# Patient Record
Sex: Male | Born: 1960 | Race: White | Hispanic: No | Marital: Married | Smoking: Never smoker
Health system: Southern US, Community
[De-identification: ages and names within clinical notes are randomized; demographics above are authoritative.]

## PROBLEM LIST (undated history)

## (undated) DIAGNOSIS — G709 Myoneural disorder, unspecified: Secondary | ICD-10-CM

## (undated) DIAGNOSIS — Z87442 Personal history of urinary calculi: Secondary | ICD-10-CM

## (undated) DIAGNOSIS — G4730 Sleep apnea, unspecified: Secondary | ICD-10-CM

## (undated) DIAGNOSIS — J45909 Unspecified asthma, uncomplicated: Secondary | ICD-10-CM

## (undated) DIAGNOSIS — R011 Cardiac murmur, unspecified: Secondary | ICD-10-CM

## (undated) DIAGNOSIS — I499 Cardiac arrhythmia, unspecified: Secondary | ICD-10-CM

## (undated) DIAGNOSIS — D689 Coagulation defect, unspecified: Secondary | ICD-10-CM

## (undated) DIAGNOSIS — I10 Essential (primary) hypertension: Secondary | ICD-10-CM

## (undated) DIAGNOSIS — Z9889 Other specified postprocedural states: Secondary | ICD-10-CM

## (undated) DIAGNOSIS — C4490 Unspecified malignant neoplasm of skin, unspecified: Secondary | ICD-10-CM

## (undated) DIAGNOSIS — J189 Pneumonia, unspecified organism: Secondary | ICD-10-CM

## (undated) DIAGNOSIS — R569 Unspecified convulsions: Secondary | ICD-10-CM

## (undated) DIAGNOSIS — M1990 Unspecified osteoarthritis, unspecified site: Secondary | ICD-10-CM

## (undated) DIAGNOSIS — Q2112 Patent foramen ovale: Secondary | ICD-10-CM

## (undated) DIAGNOSIS — D649 Anemia, unspecified: Secondary | ICD-10-CM

## (undated) DIAGNOSIS — I480 Paroxysmal atrial fibrillation: Secondary | ICD-10-CM

## (undated) DIAGNOSIS — S83206A Unspecified tear of unspecified meniscus, current injury, right knee, initial encounter: Secondary | ICD-10-CM

## (undated) DIAGNOSIS — Z9289 Personal history of other medical treatment: Secondary | ICD-10-CM

## (undated) DIAGNOSIS — B20 Human immunodeficiency virus [HIV] disease: Secondary | ICD-10-CM

## (undated) DIAGNOSIS — K219 Gastro-esophageal reflux disease without esophagitis: Secondary | ICD-10-CM

## (undated) DIAGNOSIS — I639 Cerebral infarction, unspecified: Secondary | ICD-10-CM

## (undated) DIAGNOSIS — Z800 Family history of malignant neoplasm of digestive organs: Secondary | ICD-10-CM

## (undated) DIAGNOSIS — R7303 Prediabetes: Secondary | ICD-10-CM

## (undated) HISTORY — DX: Human immunodeficiency virus (HIV) disease: B20

## (undated) HISTORY — DX: Paroxysmal atrial fibrillation: I48.0

## (undated) HISTORY — DX: Essential (primary) hypertension: I10

## (undated) HISTORY — PX: INGUINAL HERNIA REPAIR: SUR1180

## (undated) HISTORY — DX: Family history of malignant neoplasm of digestive organs: Z80.0

## (undated) HISTORY — DX: Unspecified malignant neoplasm of skin, unspecified: C44.90

## (undated) HISTORY — PX: BACK SURGERY: SHX140

## (undated) HISTORY — DX: Coagulation defect, unspecified: D68.9

## (undated) HISTORY — PX: SKIN CANCER EXCISION: SHX779

## (undated) HISTORY — DX: Anemia, unspecified: D64.9

## (undated) HISTORY — DX: Unspecified convulsions: R56.9

---

## 1997-06-24 DIAGNOSIS — Z9289 Personal history of other medical treatment: Secondary | ICD-10-CM

## 1997-06-24 HISTORY — DX: Personal history of other medical treatment: Z92.89

## 1997-10-15 ENCOUNTER — Inpatient Hospital Stay (HOSPITAL_COMMUNITY): Admission: AD | Admit: 1997-10-15 | Discharge: 1997-11-01 | Payer: Self-pay | Admitting: Oncology

## 1997-11-18 ENCOUNTER — Encounter: Admission: RE | Admit: 1997-11-18 | Discharge: 1997-11-18 | Payer: Self-pay | Admitting: Infectious Diseases

## 1997-11-18 ENCOUNTER — Encounter (INDEPENDENT_AMBULATORY_CARE_PROVIDER_SITE_OTHER): Payer: Self-pay | Admitting: *Deleted

## 1997-11-18 LAB — CONVERTED CEMR LAB
CD4 Count: 40 microliters
CD4 T Cell Abs: 40

## 1997-12-15 ENCOUNTER — Encounter: Admission: RE | Admit: 1997-12-15 | Discharge: 1997-12-15 | Payer: Self-pay | Admitting: Infectious Diseases

## 1998-01-09 ENCOUNTER — Encounter: Admission: RE | Admit: 1998-01-09 | Discharge: 1998-01-09 | Payer: Self-pay | Admitting: Infectious Diseases

## 1998-01-26 ENCOUNTER — Encounter: Admission: RE | Admit: 1998-01-26 | Discharge: 1998-01-26 | Payer: Self-pay | Admitting: Infectious Diseases

## 1998-02-20 ENCOUNTER — Encounter: Admission: RE | Admit: 1998-02-20 | Discharge: 1998-02-20 | Payer: Self-pay | Admitting: Infectious Diseases

## 1998-03-03 ENCOUNTER — Encounter: Admission: RE | Admit: 1998-03-03 | Discharge: 1998-03-03 | Payer: Self-pay | Admitting: Obstetrics and Gynecology

## 1998-06-01 ENCOUNTER — Encounter: Admission: RE | Admit: 1998-06-01 | Discharge: 1998-06-01 | Payer: Self-pay | Admitting: Infectious Diseases

## 1998-06-15 ENCOUNTER — Encounter: Admission: RE | Admit: 1998-06-15 | Discharge: 1998-06-15 | Payer: Self-pay | Admitting: Infectious Diseases

## 1998-07-11 ENCOUNTER — Emergency Department (HOSPITAL_COMMUNITY): Admission: EM | Admit: 1998-07-11 | Discharge: 1998-07-11 | Payer: Self-pay | Admitting: Emergency Medicine

## 1998-07-12 ENCOUNTER — Encounter: Payer: Self-pay | Admitting: *Deleted

## 1998-07-13 ENCOUNTER — Encounter: Admission: RE | Admit: 1998-07-13 | Discharge: 1998-07-13 | Payer: Self-pay | Admitting: Infectious Diseases

## 1998-07-13 ENCOUNTER — Ambulatory Visit (HOSPITAL_COMMUNITY): Admission: RE | Admit: 1998-07-13 | Discharge: 1998-07-13 | Payer: Self-pay | Admitting: Infectious Diseases

## 1998-07-27 ENCOUNTER — Encounter: Admission: RE | Admit: 1998-07-27 | Discharge: 1998-07-27 | Payer: Self-pay | Admitting: Infectious Diseases

## 1998-11-09 ENCOUNTER — Ambulatory Visit (HOSPITAL_COMMUNITY): Admission: RE | Admit: 1998-11-09 | Discharge: 1998-11-09 | Payer: Self-pay | Admitting: Infectious Diseases

## 1998-11-23 ENCOUNTER — Encounter: Admission: RE | Admit: 1998-11-23 | Discharge: 1998-11-23 | Payer: Self-pay | Admitting: Infectious Diseases

## 1999-02-14 ENCOUNTER — Ambulatory Visit (HOSPITAL_COMMUNITY): Admission: RE | Admit: 1999-02-14 | Discharge: 1999-02-14 | Payer: Self-pay | Admitting: Infectious Diseases

## 1999-02-14 ENCOUNTER — Encounter: Admission: RE | Admit: 1999-02-14 | Discharge: 1999-02-14 | Payer: Self-pay | Admitting: Infectious Diseases

## 1999-03-08 ENCOUNTER — Encounter: Admission: RE | Admit: 1999-03-08 | Discharge: 1999-03-08 | Payer: Self-pay | Admitting: Infectious Diseases

## 1999-05-24 ENCOUNTER — Ambulatory Visit (HOSPITAL_COMMUNITY): Admission: RE | Admit: 1999-05-24 | Discharge: 1999-05-24 | Payer: Self-pay | Admitting: Infectious Diseases

## 1999-05-24 ENCOUNTER — Encounter: Admission: RE | Admit: 1999-05-24 | Discharge: 1999-05-24 | Payer: Self-pay | Admitting: Infectious Diseases

## 1999-06-14 ENCOUNTER — Encounter: Admission: RE | Admit: 1999-06-14 | Discharge: 1999-06-14 | Payer: Self-pay | Admitting: Infectious Diseases

## 1999-09-12 ENCOUNTER — Ambulatory Visit (HOSPITAL_COMMUNITY): Admission: RE | Admit: 1999-09-12 | Discharge: 1999-09-12 | Payer: Self-pay | Admitting: Infectious Diseases

## 1999-09-12 ENCOUNTER — Encounter: Admission: RE | Admit: 1999-09-12 | Discharge: 1999-09-12 | Payer: Self-pay | Admitting: Infectious Diseases

## 1999-09-27 ENCOUNTER — Encounter: Admission: RE | Admit: 1999-09-27 | Discharge: 1999-09-27 | Payer: Self-pay | Admitting: Infectious Diseases

## 1999-12-28 ENCOUNTER — Encounter: Admission: RE | Admit: 1999-12-28 | Discharge: 1999-12-28 | Payer: Self-pay | Admitting: Infectious Diseases

## 1999-12-28 ENCOUNTER — Ambulatory Visit (HOSPITAL_COMMUNITY): Admission: RE | Admit: 1999-12-28 | Discharge: 1999-12-28 | Payer: Self-pay | Admitting: Infectious Diseases

## 2000-01-31 ENCOUNTER — Encounter: Admission: RE | Admit: 2000-01-31 | Discharge: 2000-01-31 | Payer: Self-pay | Admitting: Infectious Diseases

## 2000-06-23 ENCOUNTER — Ambulatory Visit (HOSPITAL_COMMUNITY): Admission: RE | Admit: 2000-06-23 | Discharge: 2000-06-23 | Payer: Self-pay | Admitting: Infectious Diseases

## 2000-06-23 ENCOUNTER — Encounter: Admission: RE | Admit: 2000-06-23 | Discharge: 2000-06-23 | Payer: Self-pay | Admitting: Infectious Diseases

## 2000-07-04 ENCOUNTER — Encounter: Admission: RE | Admit: 2000-07-04 | Discharge: 2000-07-04 | Payer: Self-pay | Admitting: Infectious Diseases

## 2001-02-24 ENCOUNTER — Encounter: Admission: RE | Admit: 2001-02-24 | Discharge: 2001-02-24 | Payer: Self-pay | Admitting: Infectious Diseases

## 2001-02-24 ENCOUNTER — Ambulatory Visit (HOSPITAL_COMMUNITY): Admission: RE | Admit: 2001-02-24 | Discharge: 2001-02-24 | Payer: Self-pay | Admitting: Infectious Diseases

## 2001-03-26 ENCOUNTER — Encounter: Admission: RE | Admit: 2001-03-26 | Discharge: 2001-03-26 | Payer: Self-pay | Admitting: Infectious Diseases

## 2001-05-18 ENCOUNTER — Ambulatory Visit (HOSPITAL_COMMUNITY): Admission: RE | Admit: 2001-05-18 | Discharge: 2001-05-18 | Payer: Self-pay | Admitting: General Surgery

## 2001-05-18 ENCOUNTER — Encounter: Payer: Self-pay | Admitting: General Surgery

## 2001-05-18 HISTORY — PX: OTHER SURGICAL HISTORY: SHX169

## 2001-07-01 ENCOUNTER — Ambulatory Visit (HOSPITAL_COMMUNITY): Admission: RE | Admit: 2001-07-01 | Discharge: 2001-07-01 | Payer: Self-pay | Admitting: Infectious Diseases

## 2001-07-01 ENCOUNTER — Encounter: Admission: RE | Admit: 2001-07-01 | Discharge: 2001-07-01 | Payer: Self-pay | Admitting: Infectious Diseases

## 2001-08-06 ENCOUNTER — Encounter: Admission: RE | Admit: 2001-08-06 | Discharge: 2001-08-06 | Payer: Self-pay | Admitting: Infectious Diseases

## 2002-01-20 ENCOUNTER — Encounter: Admission: RE | Admit: 2002-01-20 | Discharge: 2002-01-20 | Payer: Self-pay | Admitting: Psychiatry

## 2002-01-20 ENCOUNTER — Ambulatory Visit (HOSPITAL_COMMUNITY): Admission: RE | Admit: 2002-01-20 | Discharge: 2002-01-20 | Payer: Self-pay | Admitting: Infectious Diseases

## 2002-03-25 ENCOUNTER — Encounter: Admission: RE | Admit: 2002-03-25 | Discharge: 2002-03-25 | Payer: Self-pay | Admitting: Infectious Diseases

## 2002-05-24 ENCOUNTER — Ambulatory Visit (HOSPITAL_COMMUNITY): Admission: RE | Admit: 2002-05-24 | Discharge: 2002-05-24 | Payer: Self-pay | Admitting: Infectious Diseases

## 2002-05-24 ENCOUNTER — Encounter: Admission: RE | Admit: 2002-05-24 | Discharge: 2002-05-24 | Payer: Self-pay | Admitting: Infectious Diseases

## 2002-06-03 ENCOUNTER — Encounter: Admission: RE | Admit: 2002-06-03 | Discharge: 2002-06-03 | Payer: Self-pay | Admitting: Infectious Diseases

## 2002-09-15 ENCOUNTER — Encounter: Admission: RE | Admit: 2002-09-15 | Discharge: 2002-09-15 | Payer: Self-pay | Admitting: Infectious Diseases

## 2002-09-30 ENCOUNTER — Encounter: Admission: RE | Admit: 2002-09-30 | Discharge: 2002-09-30 | Payer: Self-pay | Admitting: Infectious Diseases

## 2002-12-16 ENCOUNTER — Encounter: Admission: RE | Admit: 2002-12-16 | Discharge: 2002-12-16 | Payer: Self-pay | Admitting: Infectious Diseases

## 2002-12-16 ENCOUNTER — Ambulatory Visit (HOSPITAL_COMMUNITY): Admission: RE | Admit: 2002-12-16 | Discharge: 2002-12-16 | Payer: Self-pay | Admitting: Infectious Diseases

## 2002-12-30 ENCOUNTER — Encounter: Admission: RE | Admit: 2002-12-30 | Discharge: 2002-12-30 | Payer: Self-pay | Admitting: Infectious Diseases

## 2003-03-31 ENCOUNTER — Encounter: Admission: RE | Admit: 2003-03-31 | Discharge: 2003-03-31 | Payer: Self-pay | Admitting: Infectious Diseases

## 2003-03-31 ENCOUNTER — Ambulatory Visit (HOSPITAL_COMMUNITY): Admission: RE | Admit: 2003-03-31 | Discharge: 2003-03-31 | Payer: Self-pay | Admitting: Infectious Diseases

## 2003-03-31 ENCOUNTER — Encounter: Payer: Self-pay | Admitting: Infectious Diseases

## 2003-04-14 ENCOUNTER — Encounter: Admission: RE | Admit: 2003-04-14 | Discharge: 2003-04-14 | Payer: Self-pay | Admitting: Infectious Diseases

## 2003-06-09 ENCOUNTER — Encounter (INDEPENDENT_AMBULATORY_CARE_PROVIDER_SITE_OTHER): Payer: Self-pay | Admitting: Specialist

## 2003-06-09 ENCOUNTER — Observation Stay (HOSPITAL_COMMUNITY): Admission: EM | Admit: 2003-06-09 | Discharge: 2003-06-10 | Payer: Self-pay | Admitting: *Deleted

## 2003-06-09 HISTORY — PX: APPENDECTOMY: SHX54

## 2003-06-28 ENCOUNTER — Encounter: Admission: RE | Admit: 2003-06-28 | Discharge: 2003-06-28 | Payer: Self-pay | Admitting: General Surgery

## 2003-06-30 ENCOUNTER — Emergency Department (HOSPITAL_COMMUNITY): Admission: EM | Admit: 2003-06-30 | Discharge: 2003-06-30 | Payer: Self-pay

## 2003-07-08 ENCOUNTER — Ambulatory Visit (HOSPITAL_COMMUNITY): Admission: RE | Admit: 2003-07-08 | Discharge: 2003-07-09 | Payer: Self-pay | Admitting: General Surgery

## 2003-07-08 ENCOUNTER — Encounter (INDEPENDENT_AMBULATORY_CARE_PROVIDER_SITE_OTHER): Payer: Self-pay | Admitting: *Deleted

## 2003-07-08 HISTORY — PX: LAPAROSCOPIC CHOLECYSTECTOMY: SUR755

## 2003-09-02 ENCOUNTER — Emergency Department (HOSPITAL_COMMUNITY): Admission: AD | Admit: 2003-09-02 | Discharge: 2003-09-02 | Payer: Self-pay | Admitting: Family Medicine

## 2003-09-13 ENCOUNTER — Ambulatory Visit (HOSPITAL_COMMUNITY): Admission: RE | Admit: 2003-09-13 | Discharge: 2003-09-13 | Payer: Self-pay | Admitting: Infectious Diseases

## 2003-09-13 ENCOUNTER — Encounter: Admission: RE | Admit: 2003-09-13 | Discharge: 2003-09-13 | Payer: Self-pay | Admitting: Infectious Diseases

## 2003-09-28 ENCOUNTER — Encounter: Admission: RE | Admit: 2003-09-28 | Discharge: 2003-09-28 | Payer: Self-pay | Admitting: Infectious Diseases

## 2004-05-28 ENCOUNTER — Ambulatory Visit: Payer: Self-pay | Admitting: Infectious Diseases

## 2004-05-28 ENCOUNTER — Ambulatory Visit (HOSPITAL_COMMUNITY): Admission: RE | Admit: 2004-05-28 | Discharge: 2004-05-28 | Payer: Self-pay | Admitting: Infectious Diseases

## 2004-06-14 ENCOUNTER — Ambulatory Visit: Payer: Self-pay | Admitting: Infectious Diseases

## 2004-06-14 ENCOUNTER — Ambulatory Visit (HOSPITAL_COMMUNITY): Admission: RE | Admit: 2004-06-14 | Discharge: 2004-06-14 | Payer: Self-pay | Admitting: Infectious Diseases

## 2004-09-20 ENCOUNTER — Ambulatory Visit: Payer: Self-pay | Admitting: Infectious Diseases

## 2004-09-20 ENCOUNTER — Encounter (INDEPENDENT_AMBULATORY_CARE_PROVIDER_SITE_OTHER): Payer: Self-pay | Admitting: *Deleted

## 2004-09-20 ENCOUNTER — Ambulatory Visit (HOSPITAL_COMMUNITY): Admission: RE | Admit: 2004-09-20 | Discharge: 2004-09-20 | Payer: Self-pay | Admitting: Infectious Diseases

## 2004-09-20 LAB — CONVERTED CEMR LAB
CD4 Count: 430 microliters
HIV 1 RNA Quant: 399 copies/mL

## 2004-10-09 ENCOUNTER — Ambulatory Visit: Payer: Self-pay | Admitting: Infectious Diseases

## 2005-06-29 ENCOUNTER — Emergency Department (HOSPITAL_COMMUNITY): Admission: EM | Admit: 2005-06-29 | Discharge: 2005-06-29 | Payer: Self-pay | Admitting: Emergency Medicine

## 2005-07-01 ENCOUNTER — Ambulatory Visit (HOSPITAL_COMMUNITY): Admission: RE | Admit: 2005-07-01 | Discharge: 2005-07-01 | Payer: Self-pay | Admitting: Infectious Diseases

## 2005-07-01 ENCOUNTER — Ambulatory Visit: Payer: Self-pay | Admitting: Infectious Diseases

## 2005-07-01 ENCOUNTER — Encounter (INDEPENDENT_AMBULATORY_CARE_PROVIDER_SITE_OTHER): Payer: Self-pay | Admitting: *Deleted

## 2005-07-01 LAB — CONVERTED CEMR LAB
CD4 Count: 490 microliters
HIV 1 RNA Quant: 399 copies/mL

## 2005-08-02 ENCOUNTER — Ambulatory Visit: Payer: Self-pay | Admitting: Infectious Diseases

## 2006-03-13 ENCOUNTER — Encounter: Admission: RE | Admit: 2006-03-13 | Discharge: 2006-03-13 | Payer: Self-pay | Admitting: Infectious Diseases

## 2006-03-13 ENCOUNTER — Encounter (INDEPENDENT_AMBULATORY_CARE_PROVIDER_SITE_OTHER): Payer: Self-pay | Admitting: *Deleted

## 2006-03-13 ENCOUNTER — Ambulatory Visit: Payer: Self-pay | Admitting: Infectious Diseases

## 2006-03-13 LAB — CONVERTED CEMR LAB
CD4 Count: 370 microliters
HIV 1 RNA Quant: 49 copies/mL

## 2006-03-27 ENCOUNTER — Ambulatory Visit: Payer: Self-pay | Admitting: Infectious Diseases

## 2006-08-18 ENCOUNTER — Encounter (INDEPENDENT_AMBULATORY_CARE_PROVIDER_SITE_OTHER): Payer: Self-pay | Admitting: *Deleted

## 2006-08-18 LAB — CONVERTED CEMR LAB

## 2006-08-31 ENCOUNTER — Encounter (INDEPENDENT_AMBULATORY_CARE_PROVIDER_SITE_OTHER): Payer: Self-pay | Admitting: *Deleted

## 2006-11-18 ENCOUNTER — Telehealth: Payer: Self-pay | Admitting: Infectious Diseases

## 2006-11-18 ENCOUNTER — Encounter: Payer: Self-pay | Admitting: Infectious Diseases

## 2006-11-18 DIAGNOSIS — M6580 Other synovitis and tenosynovitis, unspecified site: Secondary | ICD-10-CM | POA: Insufficient documentation

## 2006-11-18 DIAGNOSIS — B20 Human immunodeficiency virus [HIV] disease: Secondary | ICD-10-CM | POA: Insufficient documentation

## 2006-11-27 ENCOUNTER — Ambulatory Visit: Payer: Self-pay | Admitting: Infectious Diseases

## 2006-11-27 ENCOUNTER — Encounter: Admission: RE | Admit: 2006-11-27 | Discharge: 2006-11-27 | Payer: Self-pay | Admitting: Infectious Diseases

## 2006-12-10 ENCOUNTER — Encounter: Payer: Self-pay | Admitting: Internal Medicine

## 2006-12-11 ENCOUNTER — Ambulatory Visit: Payer: Self-pay | Admitting: Infectious Diseases

## 2007-05-28 ENCOUNTER — Emergency Department (HOSPITAL_COMMUNITY): Admission: EM | Admit: 2007-05-28 | Discharge: 2007-05-28 | Payer: Self-pay | Admitting: Emergency Medicine

## 2007-07-21 ENCOUNTER — Telehealth: Payer: Self-pay | Admitting: Infectious Diseases

## 2007-11-26 ENCOUNTER — Encounter: Admission: RE | Admit: 2007-11-26 | Discharge: 2007-11-26 | Payer: Self-pay | Admitting: Infectious Diseases

## 2007-11-26 ENCOUNTER — Ambulatory Visit: Payer: Self-pay | Admitting: Infectious Diseases

## 2007-11-26 LAB — CONVERTED CEMR LAB
ALT: 22 units/L (ref 0–53)
AST: 25 units/L (ref 0–37)
Albumin: 4.4 g/dL (ref 3.5–5.2)
Alkaline Phosphatase: 79 units/L (ref 39–117)
BUN: 17 mg/dL (ref 6–23)
Basophils Absolute: 0.1 10*3/uL (ref 0.0–0.1)
Basophils Relative: 1 % (ref 0–1)
CO2: 23 meq/L (ref 19–32)
Calcium: 9.6 mg/dL (ref 8.4–10.5)
Chloride: 108 meq/L (ref 96–112)
Cholesterol: 204 mg/dL — ABNORMAL HIGH (ref 0–200)
Creatinine, Ser: 0.88 mg/dL (ref 0.40–1.50)
Eosinophils Absolute: 0.1 10*3/uL (ref 0.0–0.7)
Eosinophils Relative: 3 % (ref 0–5)
Glucose, Bld: 91 mg/dL (ref 70–99)
HCT: 45.2 % (ref 39.0–52.0)
HDL: 30 mg/dL — ABNORMAL LOW (ref >39)
HIV 1 RNA Quant: 509 copies/mL — ABNORMAL HIGH (ref <50)
HIV-1 RNA Quant, Log: 2.71 — ABNORMAL HIGH (ref <1.70)
Hemoglobin: 15.6 g/dL (ref 13.0–17.0)
LDL Cholesterol: 135 mg/dL — ABNORMAL HIGH (ref 0–99)
Lymphocytes Relative: 39 % (ref 12–46)
Lymphs Abs: 2.1 10*3/uL (ref 0.7–4.0)
MCHC: 34.5 g/dL (ref 30.0–36.0)
MCV: 89.2 fL (ref 78.0–100.0)
Monocytes Absolute: 0.5 10*3/uL (ref 0.1–1.0)
Monocytes Relative: 9 % (ref 3–12)
Neutro Abs: 2.6 10*3/uL (ref 1.7–7.7)
Neutrophils Relative %: 48 % (ref 43–77)
Platelets: 224 10*3/uL (ref 150–400)
Potassium: 4 meq/L (ref 3.5–5.3)
RBC: 5.07 M/uL (ref 4.22–5.81)
RDW: 13.6 % (ref 11.5–15.5)
Sodium: 140 meq/L (ref 135–145)
Total Bilirubin: 0.6 mg/dL (ref 0.3–1.2)
Total CHOL/HDL Ratio: 6.8
Total Protein: 7.8 g/dL (ref 6.0–8.3)
Triglycerides: 197 mg/dL — ABNORMAL HIGH (ref <150)
VLDL: 39 mg/dL (ref 0–40)
WBC: 5.3 10*3/uL (ref 4.0–10.5)

## 2007-12-01 ENCOUNTER — Telehealth: Payer: Self-pay | Admitting: Internal Medicine

## 2007-12-10 ENCOUNTER — Encounter (INDEPENDENT_AMBULATORY_CARE_PROVIDER_SITE_OTHER): Payer: Self-pay | Admitting: *Deleted

## 2007-12-10 ENCOUNTER — Ambulatory Visit: Payer: Self-pay | Admitting: Infectious Diseases

## 2008-04-06 ENCOUNTER — Encounter: Payer: Self-pay | Admitting: Infectious Diseases

## 2008-04-06 ENCOUNTER — Ambulatory Visit: Payer: Self-pay | Admitting: Infectious Disease

## 2008-04-06 LAB — CONVERTED CEMR LAB
ALT: 30 units/L (ref 0–53)
AST: 32 units/L (ref 0–37)
Albumin: 4.6 g/dL (ref 3.5–5.2)
Alkaline Phosphatase: 85 units/L (ref 39–117)
BUN: 15 mg/dL (ref 6–23)
Basophils Absolute: 0.1 10*3/uL (ref 0.0–0.1)
Basophils Relative: 1 % (ref 0–1)
CO2: 22 meq/L (ref 19–32)
Calcium: 9.5 mg/dL (ref 8.4–10.5)
Chloride: 104 meq/L (ref 96–112)
Creatinine, Ser: 0.93 mg/dL (ref 0.40–1.50)
Eosinophils Absolute: 0.2 10*3/uL (ref 0.0–0.7)
Eosinophils Relative: 3 % (ref 0–5)
Glucose, Bld: 92 mg/dL (ref 70–99)
HCT: 45.2 % (ref 39.0–52.0)
HIV 1 RNA Quant: 93 copies/mL — ABNORMAL HIGH (ref <50)
HIV-1 RNA Quant, Log: 1.97 — ABNORMAL HIGH (ref <1.70)
Hemoglobin: 15.6 g/dL (ref 13.0–17.0)
Lymphocytes Relative: 36 % (ref 12–46)
Lymphs Abs: 2.1 10*3/uL (ref 0.7–4.0)
MCHC: 34.5 g/dL (ref 30.0–36.0)
MCV: 88.1 fL (ref 78.0–100.0)
Monocytes Absolute: 0.6 10*3/uL (ref 0.1–1.0)
Monocytes Relative: 10 % (ref 3–12)
Neutro Abs: 3 10*3/uL (ref 1.7–7.7)
Neutrophils Relative %: 51 % (ref 43–77)
Platelets: 228 10*3/uL (ref 150–400)
Potassium: 4.3 meq/L (ref 3.5–5.3)
RBC: 5.13 M/uL (ref 4.22–5.81)
RDW: 13.5 % (ref 11.5–15.5)
Sodium: 140 meq/L (ref 135–145)
Total Bilirubin: 0.5 mg/dL (ref 0.3–1.2)
Total Protein: 7.9 g/dL (ref 6.0–8.3)
WBC: 5.8 10*3/uL (ref 4.0–10.5)

## 2008-04-14 ENCOUNTER — Ambulatory Visit: Payer: Self-pay | Admitting: Infectious Diseases

## 2008-07-28 ENCOUNTER — Telehealth (INDEPENDENT_AMBULATORY_CARE_PROVIDER_SITE_OTHER): Payer: Self-pay | Admitting: *Deleted

## 2008-08-31 ENCOUNTER — Ambulatory Visit: Payer: Self-pay | Admitting: Infectious Diseases

## 2008-08-31 LAB — CONVERTED CEMR LAB
ALT: 30 units/L (ref 0–53)
AST: 33 units/L (ref 0–37)
Albumin: 4.7 g/dL (ref 3.5–5.2)
Alkaline Phosphatase: 86 units/L (ref 39–117)
BUN: 15 mg/dL (ref 6–23)
Band Neutrophils: 0 % (ref 0–10)
Basophils Absolute: 0.1 10*3/uL (ref 0.0–0.1)
Basophils Relative: 1 % (ref 0–1)
CO2: 24 meq/L (ref 19–32)
Calcium: 9.4 mg/dL (ref 8.4–10.5)
Chloride: 106 meq/L (ref 96–112)
Creatinine, Ser: 1.01 mg/dL (ref 0.40–1.50)
Eosinophils Absolute: 0.2 10*3/uL (ref 0.0–0.7)
Eosinophils Relative: 3 % (ref 0–5)
Glucose, Bld: 91 mg/dL (ref 70–99)
HCT: 46.2 % (ref 39.0–52.0)
HIV 1 RNA Quant: 430 copies/mL — ABNORMAL HIGH (ref <48)
HIV-1 RNA Quant, Log: 2.63 — ABNORMAL HIGH (ref <1.68)
Hemoglobin: 15.5 g/dL (ref 13.0–17.0)
Lymphocytes Relative: 41 % (ref 12–46)
Lymphs Abs: 2.4 10*3/uL (ref 0.7–4.0)
MCHC: 33.5 g/dL (ref 30.0–36.0)
MCV: 90.2 fL (ref 78.0–100.0)
Monocytes Absolute: 0.6 10*3/uL (ref 0.1–1.0)
Monocytes Relative: 10 % (ref 3–12)
Neutro Abs: 2.5 10*3/uL (ref 1.7–7.7)
Neutrophils Relative %: 45 % (ref 43–77)
Platelets: 212 10*3/uL (ref 150–400)
Potassium: 4.6 meq/L (ref 3.5–5.3)
RBC: 5.12 M/uL (ref 4.22–5.81)
RDW: 14 % (ref 11.5–15.5)
Sodium: 142 meq/L (ref 135–145)
Total Bilirubin: 0.5 mg/dL (ref 0.3–1.2)
Total Protein: 8.2 g/dL (ref 6.0–8.3)
WBC: 5.7 10*3/uL (ref 4.0–10.5)

## 2008-09-15 ENCOUNTER — Ambulatory Visit: Payer: Self-pay | Admitting: Infectious Diseases

## 2008-12-01 ENCOUNTER — Ambulatory Visit: Payer: Self-pay | Admitting: Cardiovascular Disease

## 2008-12-01 ENCOUNTER — Encounter (INDEPENDENT_AMBULATORY_CARE_PROVIDER_SITE_OTHER): Payer: Self-pay | Admitting: Cardiology

## 2008-12-01 ENCOUNTER — Inpatient Hospital Stay (HOSPITAL_COMMUNITY): Admission: EM | Admit: 2008-12-01 | Discharge: 2008-12-01 | Payer: Self-pay | Admitting: Emergency Medicine

## 2008-12-01 ENCOUNTER — Encounter: Payer: Self-pay | Admitting: Cardiology

## 2008-12-01 HISTORY — PX: TRANSTHORACIC ECHOCARDIOGRAM: SHX275

## 2008-12-05 ENCOUNTER — Ambulatory Visit: Payer: Self-pay | Admitting: Cardiology

## 2008-12-05 LAB — CONVERTED CEMR LAB
POC INR: 1.4
Protime: 14.4

## 2008-12-08 DIAGNOSIS — I6350 Cerebral infarction due to unspecified occlusion or stenosis of unspecified cerebral artery: Secondary | ICD-10-CM | POA: Insufficient documentation

## 2008-12-08 DIAGNOSIS — I4891 Unspecified atrial fibrillation: Secondary | ICD-10-CM | POA: Insufficient documentation

## 2008-12-08 DIAGNOSIS — I10 Essential (primary) hypertension: Secondary | ICD-10-CM | POA: Insufficient documentation

## 2008-12-09 ENCOUNTER — Encounter (INDEPENDENT_AMBULATORY_CARE_PROVIDER_SITE_OTHER): Payer: Self-pay | Admitting: Pharmacist

## 2008-12-09 ENCOUNTER — Ambulatory Visit: Payer: Self-pay | Admitting: Internal Medicine

## 2008-12-09 LAB — CONVERTED CEMR LAB
POC INR: 1.5
Protime: 14.8

## 2008-12-12 ENCOUNTER — Encounter: Payer: Self-pay | Admitting: Nurse Practitioner

## 2008-12-12 ENCOUNTER — Ambulatory Visit: Payer: Self-pay | Admitting: Internal Medicine

## 2008-12-19 ENCOUNTER — Encounter (INDEPENDENT_AMBULATORY_CARE_PROVIDER_SITE_OTHER): Payer: Self-pay | Admitting: *Deleted

## 2008-12-19 ENCOUNTER — Ambulatory Visit: Payer: Self-pay | Admitting: Cardiology

## 2008-12-19 LAB — CONVERTED CEMR LAB
POC INR: 1.6
Prothrombin Time: 15.6 s

## 2008-12-28 ENCOUNTER — Ambulatory Visit: Payer: Self-pay | Admitting: Internal Medicine

## 2008-12-28 LAB — CONVERTED CEMR LAB
POC INR: 1.8
Prothrombin Time: 16.5 s

## 2009-01-04 ENCOUNTER — Ambulatory Visit: Payer: Self-pay | Admitting: Internal Medicine

## 2009-01-04 LAB — CONVERTED CEMR LAB
POC INR: 2.1
Prothrombin Time: 17.9 s

## 2009-01-08 ENCOUNTER — Ambulatory Visit (HOSPITAL_BASED_OUTPATIENT_CLINIC_OR_DEPARTMENT_OTHER): Admission: RE | Admit: 2009-01-08 | Discharge: 2009-01-08 | Payer: Self-pay | Admitting: Internal Medicine

## 2009-01-08 ENCOUNTER — Encounter: Payer: Self-pay | Admitting: Internal Medicine

## 2009-01-15 ENCOUNTER — Ambulatory Visit: Payer: Self-pay | Admitting: Pulmonary Disease

## 2009-01-18 ENCOUNTER — Ambulatory Visit: Payer: Self-pay | Admitting: Cardiology

## 2009-01-18 LAB — CONVERTED CEMR LAB
POC INR: 2
Prothrombin Time: 17.3 s

## 2009-02-08 ENCOUNTER — Ambulatory Visit: Payer: Self-pay | Admitting: Cardiology

## 2009-02-08 LAB — CONVERTED CEMR LAB: POC INR: 2.4

## 2009-02-16 ENCOUNTER — Telehealth: Payer: Self-pay | Admitting: Internal Medicine

## 2009-02-22 ENCOUNTER — Ambulatory Visit: Payer: Self-pay | Admitting: Infectious Diseases

## 2009-02-22 LAB — CONVERTED CEMR LAB
ALT: 31 units/L (ref 0–53)
AST: 30 units/L (ref 0–37)
Albumin: 4.3 g/dL (ref 3.5–5.2)
Alkaline Phosphatase: 76 units/L (ref 39–117)
BUN: 15 mg/dL (ref 6–23)
Basophils Absolute: 0.1 10*3/uL (ref 0.0–0.1)
Basophils Relative: 1 % (ref 0–1)
CO2: 24 meq/L (ref 19–32)
Calcium: 8.9 mg/dL (ref 8.4–10.5)
Chloride: 107 meq/L (ref 96–112)
Cholesterol: 159 mg/dL (ref 0–200)
Creatinine, Ser: 0.94 mg/dL (ref 0.40–1.50)
Eosinophils Absolute: 0.1 10*3/uL (ref 0.0–0.7)
Eosinophils Relative: 2 % (ref 0–5)
Glucose, Bld: 94 mg/dL (ref 70–99)
HCT: 41.7 % (ref 39.0–52.0)
HDL: 27 mg/dL — ABNORMAL LOW (ref >39)
HIV 1 RNA Quant: 478 copies/mL — ABNORMAL HIGH (ref <48)
HIV-1 RNA Quant, Log: 2.68 — ABNORMAL HIGH (ref <1.68)
Hemoglobin: 14.2 g/dL (ref 13.0–17.0)
LDL Cholesterol: 83 mg/dL (ref 0–99)
Lymphocytes Relative: 35 % (ref 12–46)
Lymphs Abs: 2 10*3/uL (ref 0.7–4.0)
MCHC: 34.1 g/dL (ref 30.0–36.0)
MCV: 86 fL (ref >=78.0)
Monocytes Absolute: 0.6 10*3/uL (ref 0.1–1.0)
Monocytes Relative: 10 % (ref 3–12)
Neutro Abs: 2.9 10*3/uL (ref 1.7–7.7)
Neutrophils Relative %: 51 % (ref 43–77)
Platelets: 207 10*3/uL (ref 150–400)
Potassium: 4.3 meq/L (ref 3.5–5.3)
RBC: 4.85 M/uL (ref 4.22–5.81)
RDW: 14 % (ref 11.5–15.5)
Sodium: 143 meq/L (ref 135–145)
Total Bilirubin: 0.4 mg/dL (ref 0.3–1.2)
Total CHOL/HDL Ratio: 5.9
Total Protein: 7.4 g/dL (ref 6.0–8.3)
Triglycerides: 247 mg/dL — ABNORMAL HIGH (ref <150)
VLDL: 49 mg/dL — ABNORMAL HIGH (ref 0–40)
WBC: 5.8 10*3/uL (ref 4.0–10.5)

## 2009-03-08 ENCOUNTER — Ambulatory Visit: Payer: Self-pay | Admitting: Internal Medicine

## 2009-03-08 LAB — CONVERTED CEMR LAB: POC INR: 2.7

## 2009-03-23 ENCOUNTER — Ambulatory Visit: Payer: Self-pay | Admitting: Internal Medicine

## 2009-04-05 ENCOUNTER — Ambulatory Visit: Payer: Self-pay | Admitting: Internal Medicine

## 2009-04-05 LAB — CONVERTED CEMR LAB: POC INR: 2.4

## 2009-05-03 ENCOUNTER — Ambulatory Visit: Payer: Self-pay | Admitting: Internal Medicine

## 2009-05-03 LAB — CONVERTED CEMR LAB: POC INR: 2.1

## 2009-05-15 ENCOUNTER — Encounter: Payer: Self-pay | Admitting: Physician Assistant

## 2009-05-15 ENCOUNTER — Emergency Department (HOSPITAL_COMMUNITY): Admission: EM | Admit: 2009-05-15 | Discharge: 2009-05-15 | Payer: Self-pay | Admitting: Emergency Medicine

## 2009-05-15 ENCOUNTER — Ambulatory Visit: Payer: Self-pay | Admitting: Cardiology

## 2009-05-24 ENCOUNTER — Ambulatory Visit: Payer: Self-pay | Admitting: Cardiology

## 2009-05-24 ENCOUNTER — Encounter: Payer: Self-pay | Admitting: Physician Assistant

## 2009-05-25 ENCOUNTER — Encounter (INDEPENDENT_AMBULATORY_CARE_PROVIDER_SITE_OTHER): Payer: Self-pay | Admitting: *Deleted

## 2009-05-31 ENCOUNTER — Ambulatory Visit: Payer: Self-pay | Admitting: Cardiology

## 2009-05-31 LAB — CONVERTED CEMR LAB: POC INR: 1.9

## 2009-06-21 ENCOUNTER — Ambulatory Visit: Payer: Self-pay | Admitting: Cardiology

## 2009-06-21 LAB — CONVERTED CEMR LAB: POC INR: 2.1

## 2009-06-25 ENCOUNTER — Telehealth: Payer: Self-pay | Admitting: Adult Health

## 2009-06-26 ENCOUNTER — Ambulatory Visit: Payer: Self-pay | Admitting: Cardiology

## 2009-06-26 LAB — CONVERTED CEMR LAB: POC INR: 1.5

## 2009-06-27 ENCOUNTER — Ambulatory Visit: Payer: Self-pay | Admitting: Infectious Diseases

## 2009-06-27 DIAGNOSIS — R361 Hematospermia: Secondary | ICD-10-CM | POA: Insufficient documentation

## 2009-06-27 LAB — CONVERTED CEMR LAB
ALT: 51 units/L (ref 0–53)
AST: 43 units/L — ABNORMAL HIGH (ref 0–37)
Albumin: 4.6 g/dL (ref 3.5–5.2)
Alkaline Phosphatase: 78 units/L (ref 39–117)
BUN: 14 mg/dL (ref 6–23)
Basophils Absolute: 0.1 10*3/uL (ref 0.0–0.1)
Basophils Relative: 1 % (ref 0–1)
Bilirubin Urine: NEGATIVE
CO2: 20 meq/L (ref 19–32)
Calcium: 9.5 mg/dL (ref 8.4–10.5)
Chloride: 105 meq/L (ref 96–112)
Creatinine, Ser: 0.84 mg/dL (ref 0.40–1.50)
Eosinophils Absolute: 0.2 10*3/uL (ref 0.0–0.7)
Eosinophils Relative: 3 % (ref 0–5)
Glucose, Bld: 82 mg/dL (ref 70–99)
HCT: 45.9 % (ref 39.0–52.0)
HIV 1 RNA Quant: <48 copies/mL (ref <48)
HIV-1 RNA Quant, Log: <1.68 (ref <1.68)
Hemoglobin, Urine: NEGATIVE
Hemoglobin: 15.5 g/dL (ref 13.0–17.0)
Hep A Total Ab: NEGATIVE
Ketones, ur: NEGATIVE mg/dL
Leukocytes, UA: NEGATIVE
Lymphocytes Relative: 33 % (ref 12–46)
Lymphs Abs: 2.1 10*3/uL (ref 0.7–4.0)
MCHC: 33.8 g/dL (ref 30.0–36.0)
MCV: 88.6 fL (ref >=78.0)
Monocytes Absolute: 0.7 10*3/uL (ref 0.1–1.0)
Monocytes Relative: 10 % (ref 3–12)
Neutro Abs: 3.4 10*3/uL (ref 1.7–7.7)
Neutrophils Relative %: 54 % (ref 43–77)
Nitrite: NEGATIVE
PSA: 1.68 ng/mL (ref 0.10–4.00)
Platelets: 225 10*3/uL (ref 150–400)
Potassium: 4.5 meq/L (ref 3.5–5.3)
Protein, ur: NEGATIVE mg/dL
RBC: 5.18 M/uL (ref 4.22–5.81)
RDW: 13.9 % (ref 11.5–15.5)
Sodium: 142 meq/L (ref 135–145)
Specific Gravity, Urine: 1.024 (ref 1.005–1.0)
Total Bilirubin: 0.5 mg/dL (ref 0.3–1.2)
Total Protein: 7.8 g/dL (ref 6.0–8.3)
Urine Glucose: NEGATIVE mg/dL
Urobilinogen, UA: 0.2 (ref 0.0–1.0)
WBC: 6.4 10*3/uL (ref 4.0–10.5)
pH: 6 (ref 5.0–8.0)

## 2009-07-05 ENCOUNTER — Ambulatory Visit (HOSPITAL_COMMUNITY): Admission: RE | Admit: 2009-07-05 | Discharge: 2009-07-05 | Payer: Self-pay | Admitting: Infectious Diseases

## 2009-07-05 DIAGNOSIS — I861 Scrotal varices: Secondary | ICD-10-CM | POA: Insufficient documentation

## 2009-07-19 ENCOUNTER — Ambulatory Visit: Payer: Self-pay | Admitting: Cardiology

## 2009-07-19 LAB — CONVERTED CEMR LAB: POC INR: 2.1

## 2009-07-26 ENCOUNTER — Ambulatory Visit: Payer: Self-pay | Admitting: Infectious Diseases

## 2009-07-27 ENCOUNTER — Ambulatory Visit: Payer: Self-pay | Admitting: Internal Medicine

## 2009-08-01 ENCOUNTER — Encounter: Payer: Self-pay | Admitting: Infectious Diseases

## 2009-08-01 ENCOUNTER — Telehealth (INDEPENDENT_AMBULATORY_CARE_PROVIDER_SITE_OTHER): Payer: Self-pay | Admitting: *Deleted

## 2009-08-03 ENCOUNTER — Telehealth (INDEPENDENT_AMBULATORY_CARE_PROVIDER_SITE_OTHER): Payer: Self-pay | Admitting: *Deleted

## 2009-08-16 ENCOUNTER — Ambulatory Visit: Payer: Self-pay | Admitting: Internal Medicine

## 2009-08-16 LAB — CONVERTED CEMR LAB: POC INR: 2.3

## 2009-08-22 ENCOUNTER — Encounter: Payer: Self-pay | Admitting: Internal Medicine

## 2009-08-24 ENCOUNTER — Ambulatory Visit: Payer: Self-pay

## 2009-08-24 ENCOUNTER — Ambulatory Visit: Payer: Self-pay | Admitting: Internal Medicine

## 2009-09-08 ENCOUNTER — Encounter: Payer: Self-pay | Admitting: Internal Medicine

## 2009-09-13 ENCOUNTER — Ambulatory Visit: Payer: Self-pay | Admitting: Internal Medicine

## 2009-09-13 LAB — CONVERTED CEMR LAB: POC INR: 2

## 2009-09-20 ENCOUNTER — Encounter: Payer: Self-pay | Admitting: Internal Medicine

## 2009-09-20 ENCOUNTER — Telehealth: Payer: Self-pay | Admitting: Internal Medicine

## 2009-10-04 ENCOUNTER — Ambulatory Visit: Payer: Self-pay | Admitting: Internal Medicine

## 2009-10-04 LAB — CONVERTED CEMR LAB: POC INR: 2.6

## 2009-10-23 ENCOUNTER — Telehealth (INDEPENDENT_AMBULATORY_CARE_PROVIDER_SITE_OTHER): Payer: Self-pay | Admitting: *Deleted

## 2009-11-01 ENCOUNTER — Ambulatory Visit: Payer: Self-pay | Admitting: Cardiology

## 2009-11-01 LAB — CONVERTED CEMR LAB: POC INR: 2.6

## 2009-11-29 ENCOUNTER — Ambulatory Visit: Payer: Self-pay | Admitting: Cardiology

## 2009-11-29 LAB — CONVERTED CEMR LAB: POC INR: 2.9

## 2009-11-30 ENCOUNTER — Ambulatory Visit: Payer: Self-pay | Admitting: Infectious Diseases

## 2009-11-30 LAB — CONVERTED CEMR LAB
ALT: 66 units/L — ABNORMAL HIGH (ref 0–53)
AST: 61 units/L — ABNORMAL HIGH (ref 0–37)
Albumin: 4.5 g/dL (ref 3.5–5.2)
Alkaline Phosphatase: 75 units/L (ref 39–117)
BUN: 14 mg/dL (ref 6–23)
Basophils Absolute: 0 10*3/uL (ref 0.0–0.1)
Basophils Relative: 1 % (ref 0–1)
CO2: 21 meq/L (ref 19–32)
Calcium: 9.1 mg/dL (ref 8.4–10.5)
Chloride: 104 meq/L (ref 96–112)
Creatinine, Ser: 0.82 mg/dL (ref 0.40–1.50)
Eosinophils Absolute: 0.2 10*3/uL (ref 0.0–0.7)
Eosinophils Relative: 3 % (ref 0–5)
Glucose, Bld: 84 mg/dL (ref 70–99)
HCT: 44.7 % (ref 39.0–52.0)
HIV 1 RNA Quant: <48 copies/mL (ref <48)
HIV-1 RNA Quant, Log: <1.68 (ref <1.68)
Hemoglobin: 15.5 g/dL (ref 13.0–17.0)
Lymphocytes Relative: 36 % (ref 12–46)
Lymphs Abs: 2.3 10*3/uL (ref 0.7–4.0)
MCHC: 34.7 g/dL (ref 30.0–36.0)
MCV: 86.1 fL (ref 78.0–100.0)
Monocytes Absolute: 0.6 10*3/uL (ref 0.1–1.0)
Monocytes Relative: 10 % (ref 3–12)
Neutro Abs: 3.3 10*3/uL (ref 1.7–7.7)
Neutrophils Relative %: 51 % (ref 43–77)
Platelets: 224 10*3/uL (ref 150–400)
Potassium: 3.9 meq/L (ref 3.5–5.3)
RBC: 5.19 M/uL (ref 4.22–5.81)
RDW: 14.1 % (ref 11.5–15.5)
Sodium: 138 meq/L (ref 135–145)
Total Bilirubin: 0.6 mg/dL (ref 0.3–1.2)
Total Protein: 8 g/dL (ref 6.0–8.3)
WBC: 6.4 10*3/uL (ref 4.0–10.5)

## 2010-01-03 ENCOUNTER — Ambulatory Visit: Payer: Self-pay | Admitting: Internal Medicine

## 2010-01-03 LAB — CONVERTED CEMR LAB: POC INR: 2.4

## 2010-01-15 DIAGNOSIS — Z9889 Other specified postprocedural states: Secondary | ICD-10-CM | POA: Insufficient documentation

## 2010-01-31 ENCOUNTER — Encounter: Payer: Self-pay | Admitting: Internal Medicine

## 2010-01-31 ENCOUNTER — Ambulatory Visit: Payer: Self-pay | Admitting: Cardiology

## 2010-01-31 ENCOUNTER — Encounter: Payer: Self-pay | Admitting: Infectious Diseases

## 2010-01-31 LAB — CONVERTED CEMR LAB: POC INR: 3.1

## 2010-02-16 ENCOUNTER — Encounter: Payer: Self-pay | Admitting: Internal Medicine

## 2010-02-28 ENCOUNTER — Ambulatory Visit: Payer: Self-pay | Admitting: Cardiovascular Disease

## 2010-02-28 LAB — CONVERTED CEMR LAB: POC INR: 3.1

## 2010-03-21 ENCOUNTER — Telehealth: Payer: Self-pay | Admitting: Internal Medicine

## 2010-03-21 ENCOUNTER — Encounter: Payer: Self-pay | Admitting: Internal Medicine

## 2010-03-26 ENCOUNTER — Ambulatory Visit: Payer: Self-pay | Admitting: Internal Medicine

## 2010-03-26 LAB — CONVERTED CEMR LAB: POC INR: 3.1

## 2010-04-24 ENCOUNTER — Ambulatory Visit: Payer: Self-pay | Admitting: Cardiovascular Disease

## 2010-04-24 LAB — CONVERTED CEMR LAB: POC INR: 2.5

## 2010-05-22 ENCOUNTER — Encounter (INDEPENDENT_AMBULATORY_CARE_PROVIDER_SITE_OTHER): Payer: Self-pay | Admitting: *Deleted

## 2010-05-24 ENCOUNTER — Ambulatory Visit: Payer: Self-pay | Admitting: Cardiovascular Disease

## 2010-05-24 LAB — CONVERTED CEMR LAB: POC INR: 2.1

## 2010-06-21 ENCOUNTER — Ambulatory Visit: Payer: Self-pay | Admitting: Cardiology

## 2010-06-21 LAB — CONVERTED CEMR LAB: POC INR: 2.3

## 2010-06-27 ENCOUNTER — Telehealth: Payer: Self-pay | Admitting: Internal Medicine

## 2010-07-09 ENCOUNTER — Telehealth: Payer: Self-pay | Admitting: Internal Medicine

## 2010-07-19 ENCOUNTER — Ambulatory Visit: Admission: RE | Admit: 2010-07-19 | Discharge: 2010-07-19 | Payer: Self-pay | Source: Home / Self Care

## 2010-07-19 LAB — CONVERTED CEMR LAB: POC INR: 2

## 2010-07-22 LAB — CONVERTED CEMR LAB
ALT: 16 units/L (ref 0–53)
AST: 22 units/L (ref 0–37)
Albumin: 4.6 g/dL (ref 3.5–5.2)
Alkaline Phosphatase: 84 units/L (ref 39–117)
BUN: 17 mg/dL (ref 6–23)
Basophils Absolute: 0 10*3/uL (ref 0.0–0.1)
Basophils Relative: 1 % (ref 0–1)
Bilirubin Urine: NEGATIVE
CD4 Count: 610 microliters
CO2: 22 meq/L (ref 19–32)
Calcium: 9.4 mg/dL (ref 8.4–10.5)
Chloride: 108 meq/L (ref 96–112)
Cholesterol: 170 mg/dL
Cholesterol: 206 mg/dL — ABNORMAL HIGH (ref 0–200)
Creatinine, Ser: 0.93 mg/dL (ref 0.40–1.50)
Eosinophils Absolute: 0.1 10*3/uL (ref 0.0–0.7)
Eosinophils Relative: 3 % (ref 0–5)
Glucose, Bld: 87 mg/dL (ref 70–99)
Glucose, Bld: 91 mg/dL
HCT: 47.1 % (ref 39.0–52.0)
HDL: 20 mg/dL
HDL: 33 mg/dL — ABNORMAL LOW (ref >39)
HIV 1 RNA Quant: 125 copies/mL — ABNORMAL HIGH (ref <50)
HIV-1 RNA Quant, Log: 2.1 — ABNORMAL HIGH (ref <1.70)
Hemoglobin, Urine: NEGATIVE
Hemoglobin: 15.3 g/dL (ref 13.0–17.0)
Ketones, ur: NEGATIVE mg/dL
LDL Cholesterol: 150 mg/dL — ABNORMAL HIGH (ref 0–99)
LDL Cholesterol: 76 mg/dL
Leukocytes, UA: NEGATIVE
Lymphocytes Relative: 39 % (ref 12–46)
Lymphs Abs: 1.7 10*3/uL (ref 0.7–3.3)
MCHC: 32.5 g/dL (ref 30.0–36.0)
MCV: 95.9 fL (ref 78.0–100.0)
Monocytes Absolute: 0.4 10*3/uL (ref 0.2–0.7)
Monocytes Relative: 10 % (ref 3–11)
Neutro Abs: 2.1 10*3/uL (ref 1.7–7.7)
Neutrophils Relative %: 48 % (ref 43–77)
Nitrite: NEGATIVE
Platelets: 229 10*3/uL (ref 150–400)
Potassium: 4.3 meq/L (ref 3.5–5.3)
Protein, ur: NEGATIVE mg/dL
RBC: 4.91 M/uL (ref 4.22–5.81)
RDW: 14.1 % — ABNORMAL HIGH (ref 11.5–14.0)
Sodium: 141 meq/L (ref 135–145)
Specific Gravity, Urine: 1.024 (ref 1.005–1.03)
Total Bilirubin: 0.7 mg/dL (ref 0.3–1.2)
Total CHOL/HDL Ratio: 6.2
Total Protein: 8 g/dL (ref 6.0–8.3)
Triglyceride fasting, serum: 369 mg/dL
Triglycerides: 116 mg/dL (ref <150)
Urine Glucose: NEGATIVE mg/dL
Urobilinogen, UA: 0.2 (ref 0.0–1.0)
VLDL: 23 mg/dL (ref 0–40)
WBC: 4.4 10*3/uL (ref 4.0–10.5)
pH: 6 (ref 5.0–8.0)

## 2010-07-26 NOTE — Assessment & Plan Note (Signed)
Summary: 2:15 FUKAM   Chief Complaint:  f/u  .  History of Present Illness: Ethan Lambert is well and only compllaint was a mildadenitis with cold symptoms 2 weeks ago which has resolved. His HIV is 93 and CD4 count 680. He is doing very well and flu vaccine updated.    Current Allergies (reviewed today): ! MORPHINE    Risk Factors: Tobacco use:  never   Flu Vaccine Consent Questions     Do you have a history of severe allergic reactions to this vaccine? no    Any prior history of allergic reactions to egg and/or gelatin? no    Do you have a sensitivity to the preservative Thimersol? no    Do you have a past history of Guillan-Barre Syndrome? no    Do you currently have an acute febrile illness? no    Have you ever had a severe reaction to latex? no    Vaccine information given and explained to patient? yes    Are you currently pregnant? no    Lot Number:[id number]   Exp Date:12/21/2008   Site Given  Left Deltoid IM Clydie Braun MD  April 14, 2008 2:36 PM   Vital Signs:  Patient Profile:   50 Years Old Male Height:     73 inches (185.42 cm) Weight:      237.25 pounds (107.84 kg) BMI:     31.41 Temp:     98.6 degrees F (37.00 degrees C) oral Pulse rate:   76 / minute BP sitting:   140 / 89  (left arm)  Pt. in pain?   no  Vitals Entered By: Clydie Braun MD (April 14, 2008 2:27 PM)              Is Patient Diabetic? No Nutritional Status BMI of > 30 = obese Nutritional Status Detail nl  Have you ever been in a relationship where you felt threatened, hurt or afraid?No   Does patient need assistance? Functional Status Self care Ambulation Normal     Physical Exam  General:     alert and well-developed.   Mouth:     good dentition, no gingival abnormalities, and pharynx pink and moist.   Neck:     no masses.   Skin:     Intact without suspicious lesions or rashes Cervical Nodes:     No lymphadenopathy noted Axillary Nodes:     No palpable  lymphadenopathy    Impression & Recommendations:  Problem # 1:  HIV DISEASE (ICD-042)  His updated medication list for this problem includes:    Atripla 600-200-300 Mg Tabs (Efavirenz-emtricitab-tenofovir) .[name]... Take 1 tablet by mouth at bedtime  Orders: Est. Patient Level IV (95284)  Future Orders: T-CBC w/Diff ([id number]) ... 07/13/2008 T-CD4 ([id number]) ... 07/13/2008 T-Comprehensive Metabolic Panel [phone]) ... 07/13/2008 T-HIV1 Quant rflx Ultra or Genotype ([id number]) ... 07/13/2008   Other Orders: Admin 1st Vaccine (84166) Flu Vaccine 65yrs + (06301)   Patient Instructions: 1)  Please schedule a follow-up appointment in 5 months. 2)  Be sure to return for lab work one (1) week before your next appointment as scheduled.   ]

## 2010-07-26 NOTE — Medication Information (Signed)
Summary: CCR/ GD  Anticoagulant Therapy  Managed by: Hoy Register Referring MD: Gala Romney Supervising MD: Shirlee Latch MD, Freida Busman Indication 1: Atrial Fibrillation PT 15.6 INR POC 1.6  Dietary changes: no    Health status changes: yes       Details: chest tightness in the morning after waking up but usually goes away after getting up  Bleeding/hemorrhagic complications: no    Recent/future hospitalizations: no    Any changes in medication regimen? no    Recent/future dental: no  Any missed doses?: no       Is patient compliant with meds? yes       Allergies: 1)  ! Morphine  Anticoagulation Management History:      Positive risk factors for bleeding include history of CVA/TIA.  Negative risk factors for bleeding include an age less than 37 years old.  The bleeding index is 'intermediate risk'.  Positive CHADS2 values include History of HTN and Prior Stroke/CVA/TIA.  Negative CHADS2 values include Age > 44 years old.    Anticoagulation Management Assessment/Plan:      The patient's current anticoagulation dose is Coumadin 7.5 mg tabs: mondays and fridays and 5mg the rest of week.  The target INR is 2.0-3.0.  He is to have a 12/27/2008.  Anticoagulation instructions were given to patient.  Results were reviewed/authorized by Hoy Register.  He was notified by [facility] Candidate.         Prior Anticoagulation Instructions: INR 1.5 today    The patient's dosage of coumadin will be increased.  The new dosage includes: 2.5 mg extra today, to total 7.5 mg today, then 5 mg daily except 7.5 mg Mondays and Fridays.  Current Anticoagulation Instructions: INR today 1.6  Take an extra 1/2 tablet today (6/28) then follow the schedule of 7.5 mg everyday except take 5 mg on Sundays, Tuesdays and Thursdays

## 2010-07-26 NOTE — Miscellaneous (Signed)
Summary: Lab Rpt: CD4  Clinical Lists Changes  Observations: Added new observation of CD4 COUNT: 610 microliters (11/27/2006 15:39)

## 2010-07-26 NOTE — Assessment & Plan Note (Signed)
Summary: restopm   History of Present Illness: Ethan Lambert is doing quite well. He has HIV of 430 but CD4 is stable at 670. He has noted some lipoatrophy of his legs but otherwise no complaints. He has been adherent to ART and is monogamous. Continues on Atripla.  Preventive Screening-Counseling & Management     Alcohol drinks/day: <1     Alcohol type: beer     Smoking Status: never     Caffeine use/day: yes     Does Patient Exercise: yes     Type of exercise: gym membership     Exercise (avg: min/session): 30-60     Times/week: 3     HIV Risk: no risk noted     Seat Belt Use: yes  Comments: declined condoms today. .sign      Sexual History:  currently monogamous.        Drug Use:  never.     Current Allergies (reviewed today): ! MORPHINE Social History:    Drug Use:  never    Sexual History:  currently monogamous  Vital Signs:  Patient profile:   50 year old male Height:      73.0 inches (185.42 cm) Weight:      231.2 pounds (105.09 kg) BMI:     30.61 Temp:     98.7 degrees F (37.06 degrees C) oral Pulse rate:   74 / minute BP sitting:   138 / 89  (left arm)  Vitals Entered By: Jennet Maduro RN (September 15, 2008 2:19 PM) Is Patient Diabetic? No Pain Assessment Patient in pain? no      Nutritional Status BMI of > 30 = obese Nutritional Status Detail appetite "pretty good"  Have you ever been in a relationship where you felt threatened, hurt or afraid?No   Does patient need assistance? Functional Status Self care Ambulation Normal   Physical Exam  General:  alert and well-developed.   Mouth:  good dentition and pharynx pink and moist.   Neck:  supple, no masses, and no cervical lymphadenopathy.   Lungs:  normal respiratory effort and normal breath sounds.   Extremities:  Mild lipoatrophy of his calves.   Impression & Recommendations:  Problem # 1:  HIV DISEASE (ICD-042)  Orders: T-CBC w/Diff ([id number]) T-CD4 (09811-91478) T-Comprehensive Metabolic  Panel ([id number]) T-HIV Viral Load ([id number]) T-RPR (Syphilis) (28413-24401) Est. Patient Level IV (02725) Stable and continue Atripla and next visit 4-5 months.  Other Orders: T-Lipid Profile ([id number])  Patient Instructions: 1)  Please schedule a follow-up appointment in 5 months. 2)  Be sure to return for lab work one (1) week before your next appointment as scheduled.  Appended Document: H1N1 vaccine   Flu Vaccine Consent Questions    Do you have a history of severe allergic reactions to this vaccine? no    Any prior history of allergic reactions to egg and/or gelatin? no    Do you have a sensitivity to the preservative Thimersol? no    Do you have a past history of Guillan-Barre Syndrome? no    Do you currently have an acute febrile illness? no    Have you ever had a severe reaction to latex? no    Vaccine information given and explained to patient? yes  H1N1 # 1    Vaccine Type: Influenza virus vaccine- pandemic formulation (H1N1)    Site: left deltoid    Mfr: Sanofi Pasteur    Dose: 0.5 ml    Given by: Jennet Maduro RN  Exp. Date: 10/18/2009    Lot #: [id number]

## 2010-07-26 NOTE — Letter (Signed)
Summary: Dr. Dwaine Deter  Dr. Dwaine Deter   Imported By: Dorice Lamas 11/25/2006 09:45:26  _____________________________________________________________________  External Attachment:    Type:   Image     Comment:   External Document

## 2010-07-26 NOTE — Medication Information (Signed)
Summary: rov/tm  Anticoagulant Therapy  Managed by: Weston Brass, PharmD Referring MD: Gala Romney PCP: Lina Sayre MD Supervising MD: Johney Frame MD, Fayrene Fearing Indication 1: Atrial Fibrillation(427.31) Lab Used: [facility]  Site: [facility] INR POC 2.1 INR RANGE 2.0-3.0  Dietary changes: yes       Details: pt states that he had more green since last visit than usual.  Health status changes: no    Bleeding/hemorrhagic complications: no    Recent/future hospitalizations: no    Any changes in medication regimen? no    Recent/future dental: no  Any missed doses?: no       Is patient compliant with meds? yes       Allergies (verified): 1)  ! Morphine  Anticoagulation Management History:      The patient is taking warfarin and comes in today for a routine follow up visit.  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  Negative risk factors for bleeding include an age less than 37 years old.  The bleeding index is 'intermediate risk'.  Positive CHADS2 values include History of HTN and Prior Stroke/CVA/TIA.  Negative CHADS2 values include Age > 64 years old.  Anticoagulation responsible provider: Tarus Briski MD, Fayrene Fearing.  INR POC: 2.1.  Cuvette Lot#: [id number].  Exp: 03/2010.    Anticoagulation Management Assessment/Plan:      The patient's current anticoagulation dose is Coumadin 5 mg tabs: Take as directed by coumadin clinic..  The target INR is 2.0-3.0.  The next INR is due 05/31/2009.  Anticoagulation instructions were given to patient.  Results were reviewed/authorized by Weston Brass, PharmD.  He was notified by Dani Gobble, Pharmacy Student.         Prior Anticoagulation Instructions: INR 2.4 Continue 7.5mg s daily except 5mg s on Tuesdays and Thursdays. Recheck in 4wks.   Current Anticoagulation Instructions: INR: 2.1  Continue the current regimen of 1 1/2 daily except 1 tablet on Tuesdays and Thursdays.  Recheck in 4 weeks.

## 2010-07-26 NOTE — Consult Note (Signed)
Summary: Dr. Karie Soda  Dr. Karie Soda   Imported By: Florinda Marker 02/23/2010 16:03:52  _____________________________________________________________________  External Attachment:    Type:   Image     Comment:   External Document

## 2010-07-26 NOTE — Progress Notes (Signed)
  Phone Note Call from Patient   Reason for Call: Talk to Doctor Action Taken: Phone Call Completed Details for Reason: Bleeding Details of Action Taken: Coumadin held Summary of Call: Recieved phone call from Ethan Lambert concerning blood in semen.  Pt is on coumadin and was last checked on 12/29.  He was told to call for any unusual bleeding.  I have asked him to hold his Coumadin today.  He will follow up with the coumadin clinic this week.  His next appointment is the end of January, but with these complaints he will be seen sooner.

## 2010-07-26 NOTE — Miscellaneous (Signed)
Summary: RW  Clinical Lists Changes  Observations: Added new observation of ABNLIPIDS: No (11/27/2006 16:42) Added new observation of LASTLIPIDPRO: 11/27/2006 (11/27/2006 16:42) Added new observation of SYPHLSCREEN: 11/27/2006 (11/27/2006 16:42)

## 2010-07-26 NOTE — Medication Information (Signed)
Summary: rov/tm  Anticoagulant Therapy  Managed by: Bethena Midget, RN, BSN Referring MD: Gala Romney PCP: Lina Sayre MD Supervising MD: Riley Kill MD, Maisie Fus Indication 1: Atrial Fibrillation(427.31) Lab Used: [facility] Site: [address] INR POC 2.1 INR RANGE 2.0-3.0  Dietary changes: no    Health status changes: no    Bleeding/hemorrhagic complications: no    Recent/future hospitalizations: no    Any changes in medication regimen? no    Recent/future dental: no  Any missed doses?: no       Is patient compliant with meds? yes       Allergies: 1)  ! Morphine  Anticoagulation Management History:      The patient is taking warfarin and comes in today for a routine follow up visit.  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  Negative risk factors for bleeding include an age less than 75 years old.  The bleeding index is 'intermediate risk'.  Positive CHADS2 values include History of HTN and Prior Stroke/CVA/TIA.  Negative CHADS2 values include Age > 29 years old.  Anticoagulation responsible provider: Riley Kill MD, Maisie Fus.  INR POC: 2.1.  Cuvette Lot#: [id number].  Exp: 07/2010.    Anticoagulation Management Assessment/Plan:      The patient's current anticoagulation dose is Coumadin 5 mg tabs: Take as directed by coumadin clinic..  The target INR is 2.0-3.0.  The next INR is due 07/19/2009.  Anticoagulation instructions were given to patient.  Results were reviewed/authorized by Bethena Midget, RN, BSN.  He was notified by Bethena Midget, RN, BSN.         Prior Anticoagulation Instructions: INR 1.9 Change dose to 1.5 tablets everyday except on Tuesdays take 1 tablet. Recheck in 3 weeks.   Current Anticoagulation Instructions: INR 2.1 Continue 7.5mg s daily except 5mg s on Tuesdays. Recheck in 4 weeks.

## 2010-07-26 NOTE — Assessment & Plan Note (Signed)
Summary: blood in semen, on coumadin / dde   Primary Provider:  Lina Sayre MD  CC:  blood in semen started on "Sunday and pt was advised to d/c coumadin which he d/c and restarted yesterday .  Blood present Sun. Monday .  History of Present Illness: 50 yo M with HIV+ ( atripla), last CD4 800, VL 478 (02-22-09) as well as HTN, Afib (on coumadin since June 2010) and prev CVA (1999 x 2).   Noted to have blood in semen on 06-21-10. Has been off coumadin since. INR was 1.5 yesterday after bing off since 12-29 (INR 2.1). Has had some occas nose bleeds in AM. no blood in urine or in BM. no pain with urination. No pain with ejaculation, has noted some discomfort. has noted bump at base of his penis on L, present for along time.   Preventive Screening-Counseling & Management  Alcohol-Tobacco     Alcohol drinks/day: 0     Smoking Status: never   Updated Prior Medication List: ATRIPLA 600-200-300 MG TABS (EFAVIRENZ-EMTRICITAB-TENOFOVIR) Take 1 tablet by mouth at bedtime  (Pt's ID # [id number]) CARDIZEM CD 120 MG XR24H-CAP (DILTIAZEM HCL COATED BEADS) take one by mouth once daily COUMADIN 5 MG TABS (WARFARIN SODIUM) Take as directed by coumadin clinic. 1 and 1/2 daily except one Tuesday when he takes one tablet. TAMBOCOR 150 MG TABS (FLECAINIDE ACETATE) take two as needed  Current Allergies: ! MORPHINE Past History:  Past medical, surgical, family and social histories (including risk factors) reviewed, and no changes noted (except as noted below).  Past Medical History: Reviewed history from 03/23/2009 and no changes required. ATRIAL FIBRILLATION WITH RAPID VENTRICULAR RESPONSE (ICD-427.31)      a.  11/2008: Echo -  EF 55-65%.  Negative bubble study. HYPERTENSION (ICD-401.9) CVA (ICD-434.91)  -1999 ENCOUNTER FOR LONG-TERM USE OF OTHER MEDICATIONS (ICD-V58.69) TENDINITIS, ELBOW (ICD-727.09) HIV DISEASE (ICD-042)  Past Surgical History: Reviewed history from 12/08/2008 and no changes  required.  appendectomy.  cholecystectomy  Family History: Reviewed history from 12/08/2008 and no changes required.   Both of his parents are alive, his mother is 79 and his   dad is 82.  His dad has an arrhythmia, which is possibly atrial   fibrillation, and he has 1 brother who is alive in his late 50s with an   MI.   Social History: Reviewed history from 12/08/2008 and no changes required. Lives in [place] with his partner and works as a   shipping and receiving clerk.  He has no history of alcohol, tobacco, or   drug abuse.  He has recently joint a gym and exercises 3 times a week.   He feels he eats a reasonably heart healthy diet but admits to   indiscretions with sweets.      Review of Systems       has not needed to use flecainide since prescribed. wt steady.  Vital Signs:  Patient profile:   50 year old male Height:      73 inches Weight:      247.1 pounds BMI:     32.72 BSA:     2.36 Temp:     98" .5 degrees F oral Pulse rate:   82 / minute BP sitting:   142 / 87  (right arm)  Vitals Entered By: Tomasita Morrow RN (June 27, 2009 3:24 PM) CC: blood in semen started on Sunday, pt was advised to d/c coumadin which he d/c and restarted yesterday .  Blood present Sun.  Monday  Is Patient Diabetic? No Pain Assessment Patient in pain? no      Nutritional Status BMI of > 30 = obese Nutritional Status Detail normal  Have you ever been in a relationship where you felt threatened, hurt or afraid?No   Does patient need assistance? Functional Status Self care Ambulation Normal   Physical Exam  General:  well-developed, well-nourished, well-hydrated, and overweight-appearing.   Eyes:  pupils equal, pupils round, and pupils reactive to light.  minimal injection Mouth:  pharynx pink and moist and no exudates.  no gum bleeding.  Lungs:  normal respiratory effort and normal breath sounds.   Heart:  normal rate, regular rhythm, and no murmur.   Abdomen:  soft,  non-tender, and normal bowel sounds.   Genitalia:  suggestion of varicoceles, bilaterally. non-tender.         Medication Adherence: 06/27/2009   Adherence to medications reviewed with patient. Counseling to provide adequate adherence provided   Prevention For Positives: 06/27/2009   Safe sex practices discussed with patient. Condoms offered.                             Impression & Recommendations:  Problem # 1:  HIV DISEASE (ICD-042)  will check his routine labs, he will f/u with Dr Maurice March. He is in committed relationship, no new partners.  flu shot. screen for Hep A.  Orders: T-Hepatitis A Antibody ([id number]) T-PSA ([id number]) Ultrasound (Ultrasound)  Problem # 2:  HEMATOSPERMIA (QIO-962.95)  not clear that he may have varicocele. suspect this could be related to his coumadin use as well. will check u/s. he also asks to have PSA done.   Orders: Ultrasound (Ultrasound)  Medications Added to Medication List This Visit: 1)  Coumadin 5 Mg Tabs (Warfarin sodium) .... Take as directed by coumadin clinic. 1 and 1/2 daily except one tuesday when he takes one tablet.  Other Orders: Est. Patient Level IV [phone]) T-CD4SP ([facility]) (CD4SP) T-HIV Viral Load [phone]) T-Comprehensive Metabolic Panel [phone]) T-CBC w/Diff [phone]) T-Urinalysis [phone]) T-RPR (Syphilis) [phone])  Process Orders Check Orders Results:     [facility]: ABN not required for this insurance Tests Sent for requisitioning (June 27, 2009 3:57 PM):     06/27/2009: [facility] -- T-HIV Viral Load [phone] (signed)     06/27/2009: [facility] -- T-Comprehensive Metabolic Panel [[id number]] (signed)     06/27/2009: [facility] -- T-CBC w/Diff [[id number]] (signed)     06/27/2009: [facility] -- T-Urinalysis [[id number]] (signed)     06/27/2009: [facility] -- T-RPR  (Syphilis) [phone] (signed)     06/27/2009: [facility] -- T-Hepatitis A Antibody [[id number]] (signed)     06/27/2009: [facility] -- T-PSA [[id number]] (signed)   Prevention & Chronic Care Immunizations   Influenza vaccine: Fluvax 3+  (04/14/2008)    Tetanus booster: Not documented    Pneumococcal vaccine: Pneumovax  (12/10/2007)  Other Screening   Smoking status: never  (06/27/2009)  Lipids   Total Cholesterol: 159  (02/22/2009)   LDL: 83  (02/22/2009)   LDL Direct: Not documented   HDL: 27  (02/22/2009)   Triglycerides: 247  (02/22/2009)  Hypertension   Last Blood Pressure: 142 / 87  (06/27/2009)   Serum creatinine: 0.94  (02/22/2009)   Serum potassium 4.3  (02/22/2009) CMP ordered   Self-Management Support :    Hypertension self-management support: Not documented

## 2010-07-26 NOTE — Progress Notes (Signed)
Summary: pt has elevated b/p  Phone Note Call from Patient Call back at Work Phone [phone]   Caller: Patient Reason for Call: Talk to Nurse, Talk to Doctor Summary of Call: pt got a call yesterday about b/p being to high. meds had been changed since 3/22 and b/p is still high. per pt call his b/p was 156/99 it was taken at his work this morning. he had a nose bleed last night. he is at work if he dosen't answer you can leave a message and he will call right back Initial call taken by: Omer Jack,  September 20, 2009 8:09 AM  Follow-up for Phone Call        [facility] for call back. Follow-up by: Suzan Garibaldi RN  Additional Follow-up for Phone Call Additional follow up Details #1::        Called patient back..he states that he has been taking Cardizem 180mg  every day. BP at work by plant nurse was 156/99 146/96. Advised him to take another Cardizem 120mg  today and then starting tomorrow he will increase Cardizem to 240mg  per day per Dr.Bensimhon's order and call us with BP AND Heart Rate logs. He will also let us know when he would like a new script sent into pharmacy...he will use his current supply right now. Advised him not to do any heavy lifting while BP is elevated...he states that he can get help with this if necessary. Additional Follow-up by: J REISS RN    New/Updated Medications: CARDIZEM CD 240 MG XR24H-CAP (DILTIAZEM HCL COATED BEADS) 1 cap every day

## 2010-07-26 NOTE — Miscellaneous (Signed)
  Clinical Lists Changes  Observations: Added new observation of YEARAIDSPOS: 1999  (05/22/2010 11:59) Added new observation of HIV STATUS: CDC-defined AIDS  (05/22/2010 11:59)

## 2010-07-26 NOTE — Letter (Signed)
Summary: Patient's Vitals  Patient's Vitals   Imported By: Marylou Mccoy 08/22/2009 16:09:54  _____________________________________________________________________  External Attachment:    Type:   Image     Comment:   External Document

## 2010-07-26 NOTE — Letter (Signed)
Summary: Dr Karie Soda' Office Note  Dr Karie Soda' Office Note   Imported By: Roderic Ovens 02/14/2010 14:44:27  _____________________________________________________________________  External Attachment:    Type:   Image     Comment:   External Document

## 2010-07-26 NOTE — Medication Information (Signed)
Summary: rov/sp  Anticoagulant Therapy  Managed by: Ethan Lambert, PharmD Referring MD: Ethan Lambert PCP: Ethan Sayre MD Supervising MD: Ethan Frame MD, Ethan Lambert Indication 1: Atrial Fibrillation(427.31) Lab Used: [facility]  Site: [address] INR POC 2.4 INR RANGE 2.0-3.0  Vital Signs: Blood Pressure:  134 / 90   Dietary changes: no    Health status changes: no    Bleeding/hemorrhagic complications: no    Recent/future hospitalizations: no    Any changes in medication regimen? no    Recent/future dental: no  Any missed doses?: no       Is patient compliant with meds? yes      Comments: pt requested bp to be taken at this visit  Allergies: 1)  ! Morphine 2)  ! * Plastic Tape  Anticoagulation Management History:      The patient is taking warfarin and comes in today for a routine follow up visit.  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  Negative risk factors for bleeding include an age less than 60 years old.  The bleeding index is 'intermediate risk'.  Positive CHADS2 values include History of HTN and Prior Stroke/CVA/TIA.  Negative CHADS2 values include Age > 38 years old.  Anticoagulation responsible provider: [name] MD, Ethan Lambert.  INR POC: 2.4.  Cuvette Lot#: [id number].  Exp: 02/2011.    Anticoagulation Management Assessment/Plan:      The patient's current anticoagulation dose is Coumadin 5 mg tabs: Take as directed by coumadin clinic. 1 and 1/2 daily except one Tuesday when he takes one tablet..  The target INR is 2.0-3.0.  The next INR is due 01/31/2010.  Anticoagulation instructions were given to patient.  Results were reviewed/authorized by Ethan Lambert, PharmD.  He was notified by Ethan Lambert.         Prior Anticoagulation Instructions: INR 2.9  Continue same dose of 1 1/2 tablets every day.    Current Anticoagulation Instructions: INR 2.4  Continue same dose of 1.5 tabs daily.  Re-check INR in 4 weeks.

## 2010-07-26 NOTE — Medication Information (Signed)
Summary: Ethan Lambert  Anticoagulant Therapy  Managed by: Bethena Midget, RN, BSN Referring MD: Gala Romney PCP: Lina Sayre MD Supervising MD: Ladona Ridgel MD, Sharlot Gowda Indication 1: Atrial Fibrillation(427.31) Lab Used: [facility] Site: [address] INR POC 2.6 INR RANGE 2.0-3.0  Dietary changes: no    Health status changes: no    Bleeding/hemorrhagic complications: no    Recent/future hospitalizations: no    Any changes in medication regimen? no    Recent/future dental: no  Any missed doses?: no       Is patient compliant with meds? yes       Allergies: 1)  ! Morphine 2)  ! * Plastic Tape  Anticoagulation Management History:      The patient is taking warfarin and comes in today for a routine follow up visit.  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  Negative risk factors for bleeding include an age less than 52 years old.  The bleeding index is 'intermediate risk'.  Positive CHADS2 values include History of HTN and Prior Stroke/CVA/TIA.  Negative CHADS2 values include Age > 66 years old.  Anticoagulation responsible provider: Ladona Ridgel MD, Sharlot Gowda.  INR POC: 2.6.  Cuvette Lot#: [id number].  Exp: 01/2011.    Anticoagulation Management Assessment/Plan:      The patient's current anticoagulation dose is Coumadin 5 mg tabs: Take as directed by coumadin clinic. 1 and 1/2 daily except one Tuesday when he takes one tablet..  The target INR is 2.0-3.0.  The next INR is due 11/29/2009.  Anticoagulation instructions were given to patient.  Results were reviewed/authorized by Bethena Midget, RN, BSN.  He was notified by Bethena Midget, RN, BSN.         Prior Anticoagulation Instructions: INR 2.6  Continue on same dosage 1.5 tablets (7.5mg ) daily.  Recheck in 4 weeks.    Current Anticoagulation Instructions: INR 2.6 Continue 7.5mg s daily. Recheck in 4 weeks.

## 2010-07-26 NOTE — Progress Notes (Signed)
Summary: refill/mld (waiting on mail order to arrived)  Phone Note From Pharmacy   [facility] Summary of Call: Pharmacy called while patient in store. Patient requesting a few pills be called in.  He has not received his mail order prescription and should receive that in a few days.  I told tech I can call in a 2 week supply for him since it will take a few days.  This should cover him. Initial call taken by: Paulo Fruit  BS,CPht II,MPH,  August 01, 2009 1:56 PM    Prescriptions: ATRIPLA 600-200-300 MG TABS (EFAVIRENZ-EMTRICITAB-TENOFOVIR) Take 1 tablet by mouth at bedtime  (Pt's ID # [id number])  #14 x 0   Entered by:   Paulo Fruit  BS,CPht II,MPH   Authorized by:   Lina Sayre MD   Signed by:   Paulo Fruit  BS,CPht II,MPH on 08/01/2009   Method used:   Telephoned to ...       [phone][facility] (retail)       [address]       [place], [place]  [address]       Ph: [phone] or [phone]       Fax: [phone]   RxID:   [id number]  Paulo Fruit  BS,CPht II,MPH  August 01, 2009 1:57 PM

## 2010-07-26 NOTE — Medication Information (Signed)
Summary: rov/hmm  Anticoagulant Therapy  Managed by: Bethena Midget, RN, BSN Referring MD: Gala Romney PCP: Lina Sayre MD Supervising MD: Gala Romney MD, Pat Sires Indication 1: Atrial Fibrillation(427.31) Lab Used: [facility] Site: [address] INR POC 2.4 INR RANGE 2.0-3.0  Dietary changes: no    Health status changes: yes       Details: Had few palpitations on Monday, denied any cp or sob with them   Bleeding/hemorrhagic complications: no    Recent/future hospitalizations: no    Any changes in medication regimen? no    Recent/future dental: no  Any missed doses?: no       Is patient compliant with meds? yes       Allergies (verified): 1)  ! Morphine  Anticoagulation Management History:      The patient is taking warfarin and comes in today for a routine follow up visit.  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  Negative risk factors for bleeding include an age less than 52 years old.  The bleeding index is 'intermediate risk'.  Positive CHADS2 values include History of HTN and Prior Stroke/CVA/TIA.  Negative CHADS2 values include Age > 37 years old.  Anticoagulation responsible provider: Skylur Fuston MD, Reuel Boom.  INR POC: 2.4.  Cuvette Lot#: [id number].  Exp: 04/2010.    Anticoagulation Management Assessment/Plan:      The patient's current anticoagulation dose is Coumadin 5 mg tabs: Take as directed by coumadin clinic..  The target INR is 2.0-3.0.  The next INR is due 05/03/2009.  Anticoagulation instructions were given to patient.  Results were reviewed/authorized by Bethena Midget, RN, BSN.  He was notified by Bethena Midget, RN, BSN.         Prior Anticoagulation Instructions: INR 2.7  Continue same dosing schedule below.   Current Anticoagulation Instructions: INR 2.4 Continue 7.5mg s daily except 5mg s on Tuesdays and Thursdays. Recheck in 4wks.

## 2010-07-26 NOTE — Medication Information (Signed)
Summary: rov/eac  Anticoagulant Therapy  Managed by: Bethena Midget, RN, BSN Referring MD: Gala Romney PCP: Lina Sayre MD Supervising MD: Johney Frame MD, Fayrene Fearing Indication 1: Atrial Fibrillation(427.31) Lab Used: [facility]  Site: [address] INR POC 2.0 INR RANGE 2.0-3.0  Dietary changes: no    Health status changes: no    Bleeding/hemorrhagic complications: no    Recent/future hospitalizations: no    Any changes in medication regimen? yes       Details: Cardizem dose was increased  Recent/future dental: no  Any missed doses?: no       Is patient compliant with meds? yes       Allergies (verified): 1)  ! Morphine 2)  ! * Plastic Tape  Anticoagulation Management History:      The patient is taking warfarin and comes in today for a routine follow up visit.  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  Negative risk factors for bleeding include an age less than 54 years old.  The bleeding index is 'intermediate risk'.  Positive CHADS2 values include History of HTN and Prior Stroke/CVA/TIA.  Negative CHADS2 values include Age > 25 years old.  Anticoagulation responsible provider: Emillio Ngo MD, Fayrene Fearing.  INR POC: 2.0.  Cuvette Lot#: [id number].  Exp: 10/2010.    Anticoagulation Management Assessment/Plan:      The patient's current anticoagulation dose is Coumadin 5 mg tabs: Take as directed by coumadin clinic. 1 and 1/2 daily except one Tuesday when he takes one tablet..  The target INR is 2.0-3.0.  The next INR is due 10/04/2009.  Anticoagulation instructions were given to patient.  Results were reviewed/authorized by Bethena Midget, RN, BSN.  He was notified by Bethena Midget, RN, BSN.         Prior Anticoagulation Instructions: INR 2.3  Continue taking 1 tablet on Tuesday and 1.5 tablets all other days.  Return to clinic in 4 weeks.   Current Anticoagulation Instructions: INR 2.0 Change dose to 7.5mg s everyday.Recheck in 3 wks.

## 2010-07-26 NOTE — Assessment & Plan Note (Signed)
Summary: Ethan Lambert   Visit Type:  F/U  Chief Complaint:  follow up visit .  History of Present Illness: Aurelio Brash is here for f/u of HIV. He has been asymptomatic and adherent to his Atripla.currently his only medications.He is now 50 yo. He has been on [employer] for 6 months and lost 45 lbs. He has had no systemic symptoms. Most recent CD4 is 610 andHIV 125. Fasting cholesterol is 207 with LDL150. Wil check next time when he has lostmore weight.   Current Allergies: ! MORPHINE    Risk Factors: Tobacco use:  never    Vital Signs:  Patient Profile:   50 Years Old Male Height:     73 inches (185.42 cm) Weight:      208.9 pounds (94.95 kg) BMI:     27.66 Temp:     63 degrees F (17.22 degrees C) oral Pulse rate:   63 / minute BP sitting:   144 / 91  (right arm)  Pt. in pain?   no  Vitals Entered By: Clarise Cruz Duncan Dull) (December 11, 2006 9:05 AM)              Is Patient Diabetic? No Nutritional Status BMI of 19 -24 = normal  Have you ever been in a relationship where you felt threatened, hurt or afraid?No   Does patient need assistance? Functional Status Self care Ambulation Normal   Physical Exam  General:     well-developed and well-nourished.   Mouth:     good dentition, no gingival abnormalities, and pharynx pink and moist.   Neck:     supple, no masses, no thyromegaly, and no cervical lymphadenopathy.   Lungs:     normal breath sounds.   Heart:     regular rhythm, no murmur, and no gallop.      Impression & Recommendations:  Problem # 1:  HIV DISEASE (ICD-042)  His updated medication list for this problem includes:    Atripla 600-200-300 Mg Tabs (Efavirenz-emtricitab-tenofovir) .[name]... Take 1 tablet by mouth at bedtime He has smHIV blip but overall is well and doing well.Note BP 140/91 and will observe in f/u visits. Orders: T-Comprehensive Metabolic Panel [id number]) T-Lipid Profile [id number]) T-CBC w/Diff [id number]) T-CD4  [phone]) T-HIV Viral Load ([id number])    Patient Instructions: 1)  Please schedule a follow-up appointment in 4 months. 2)  Be sure to return for lab work one (1) week before your next appointment as scheduled.   Appended Document: Ethan Lambert      Current Allergies: ! MORPHINE

## 2010-07-26 NOTE — Medication Information (Signed)
Summary: rov/ln  Anticoagulant Therapy  Managed by: Weston Brass, PharmD Referring MD: Gala Romney PCP: Lina Sayre MD Supervising MD: Jens Som MD, Arlys John Indication 1: Atrial Fibrillation(427.31) Lab Used: [facility] Site: [facility] INR POC 3.1 INR RANGE 2.0-3.0  Dietary changes: no    Health status changes: no    Bleeding/hemorrhagic complications: no    Recent/future hospitalizations: no    Any changes in medication regimen? no    Recent/future dental: no  Any missed doses?: no       Is patient compliant with meds? yes       Allergies: 1)  ! Morphine 2)  ! * Plastic Tape  Anticoagulation Management History:      The patient is taking warfarin and comes in today for a routine follow up visit.  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  Negative risk factors for bleeding include an age less than 52 years old.  The bleeding index is 'intermediate risk'.  Positive CHADS2 values include History of HTN and Prior Stroke/CVA/TIA.  Negative CHADS2 values include Age > 32 years old.  Anticoagulation responsible provider: Jens Som MD, Arlys John.  INR POC: 3.1.  Cuvette Lot#: [id number].  Exp: 03/2011.    Anticoagulation Management Assessment/Plan:      The patient's current anticoagulation dose is Coumadin 5 mg tabs: Take as directed by coumadin clinic. 1 and 1/2 daily except one Tuesday when he takes one tablet..  The target INR is 2.0-3.0.  The next INR is due 02/28/2010.  Anticoagulation instructions were given to patient.  Results were reviewed/authorized by Weston Brass, PharmD.  He was notified by Weston Brass PharmD.         Prior Anticoagulation Instructions: INR 2.4  Continue same dose of 1.5 tabs daily.  Re-check INR in 4 weeks.  Current Anticoagulation Instructions: INR 3.1  Take 1/2 tablet tomorrow then resume same dose of 1 1/2 tablets every day.  Recheck INR in 4 weeks.

## 2010-07-26 NOTE — Assessment & Plan Note (Signed)
Summary: eph/ gd   Primary Provider:  Lina Sayre MD  CC:  no complaints.  History of Present Illness: This is a 50 year old white male patient, who presented to the emergency room in rapid atrial fibrillation. He was on IV Cardizem at 5 mg for an hour, which was all his blood pressure would tolerate his heart rate was still in the 120s and 130s. The patient was given flecainide 300 mg and converted to sinus rhythm. Dr. Graciela Husbands recommended he take this on a p.r.n. basis.  The patient has not had any further atrial fibrillation since November 22. He has done well and has not used the p.r.n.flecainide.  Patient's blood pressure is elevated today. He does admit to eating salty foods including frozen Lean Cuisine & salted nuts daily.  Current Medications (verified): 1)  Atripla 600-200-300 Mg Tabs (Efavirenz-Emtricitab-Tenofovir) .... Take 1 Tablet By Mouth At Bedtime  (Pt's Id # [id number]) 2)  Cardizem Cd 120 Mg Xr24h-Cap (Diltiazem Hcl Coated Beads) .... Take One By Mouth Once Daily 3)  Coumadin 5 Mg Tabs (Warfarin Sodium) .... Take As Directed By Coumadin Clinic. 4)  Tambocor 150 Mg Tabs (Flecainide Acetate) .... Take Two As Needed  Allergies: 1)  ! Morphine  Past History:  Past Medical History: Last updated: 03/23/2009 ATRIAL FIBRILLATION WITH RAPID VENTRICULAR RESPONSE (ICD-427.31)      a.  11/2008: Echo -  EF 55-65%.  Negative bubble study. HYPERTENSION (ICD-401.9) CVA (ICD-434.91)  -1999 ENCOUNTER FOR LONG-TERM USE OF OTHER MEDICATIONS (ICD-V58.69) TENDINITIS, ELBOW (ICD-727.09) HIV DISEASE (ICD-042)  Review of Systems       see history of present illness. Patient denies palpitations, dyspnea, dyspnea on exertion, dizziness, or presyncope. Denies chest pain  Vital Signs:  Patient profile:   50 year old male Height:      73 inches Weight:      247 pounds Pulse rate:   72 / minute Pulse rhythm:   regular BP sitting:   140 / 92  (left arm)  Vitals Entered By: Jacquelin Hawking, CMA (May 24, 2009 8:28 AM)  Serial Vital Signs/Assessments:  Time      Position  BP       Pulse  Resp  Temp     By                     120/80                         Concetta Pfohl, CMA   Physical Exam  General:   Well-nournished, in no acute distress. Neck: No JVD, HJR, Bruit, or thyroid enlargement Lungs: No tachypnea, clear without wheezing, rales, or rhonchi Cardiovascular: RRR, PMI not displaced, heart sounds normal, no murmurs, gallops, bruit, thrill, or heave. Abdomen: BS normal. Soft without organomegaly, masses, lesions or tenderness. Extremities: without cyanosis, clubbing or edema. Good distal pulses bilateral SKin: Warm, no lesions or rashes  Musculoskeletal: No deformities Neuro: no focal signs    Impression & Recommendations:  Problem # 1:  ATRIAL FIBRILLATION WITH RAPID VENTRICULAR RESPONSE (ICD-427.31) Patient is in normal sinus rhythm today. He has p.r.n. flecainide to take for recurrence. His updated medication list for this problem includes:    Coumadin 5 Mg Tabs (Warfarin sodium) .[name]... Take as directed by coumadin clinic.    Tambocor 150 Mg Tabs (Flecainide acetate) .[name]... Take two as needed  Orders: EKG w/ Interpretation (93000)  Problem # 2:  HYPERTENSION (ICD-401.9) Initially patient's blood  pressure was elevated, but when I retook it. His blood pressure came down to 120/80. I discussed a 2 g sodium diet with the patient. His updated medication list for this problem includes:    Cardizem Cd 120 Mg Xr24h-cap (Diltiazem hcl coated beads) .[name]... Take one by mouth once daily  Patient Instructions: 1)  Your physician recommends that you schedule a follow-up appointment in: Pt. has an appointment with Dr. Gala Romney Jan.18th/11 @ 11:00 AM 2)  Your physician has requested that you limit the intake of sodium (salt) in your diet to two grams daily. Please see [facility] handout.

## 2010-07-26 NOTE — Medication Information (Signed)
Summary: rov/ewj  Anticoagulant Therapy  Managed by: Bethena Midget, RN, BSN Referring MD: Gala Romney PCP: Lina Sayre MD Supervising MD: Eden Emms MD, Theron Arista Indication 1: Atrial Fibrillation(427.31) Lab Used: [facility] Site: [address] INR POC 2.1 INR RANGE 2.0-3.0  Dietary changes: no    Health status changes: no    Bleeding/hemorrhagic complications: no    Recent/future hospitalizations: no    Any changes in medication regimen? no    Recent/future dental: no  Any missed doses?: no       Is patient compliant with meds? yes       Allergies: 1)  ! Morphine 2)  ! * Plastic Tape  Anticoagulation Management History:      The patient is taking warfarin and comes in today for a routine follow up visit.  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  Negative risk factors for bleeding include an age less than 4 years old.  The bleeding index is 'intermediate risk'.  Positive CHADS2 values include History of HTN and Prior Stroke/CVA/TIA.  Negative CHADS2 values include Age > 47 years old.  Anticoagulation responsible provider: Eden Emms MD, Theron Arista.  INR POC: 2.1.  Cuvette Lot#: [id number].  Exp: 05/2011.    Anticoagulation Management Assessment/Plan:      The patient's current anticoagulation dose is Coumadin 5 mg tabs: Take as directed by coumadin clinic. 1 and 1/2 daily except one Tuesday when he takes one tablet..  The target INR is 2.0-3.0.  The next INR is due 06/21/2010.  Anticoagulation instructions were given to patient.  Results were reviewed/authorized by Bethena Midget, RN, BSN.  He was notified by Bethena Midget, RN, BSN.         Prior Anticoagulation Instructions: INR 2.5  Continue on same dosage 1.5 tablets daily except 1 tablet on Tuesdays and Thursdays.  Recheck in 4 weeks.   Current Anticoagulation Instructions: INR 2.1 Continue 7.5mg s daily except 5mg s on Tuesdays and Thursdays. Recheck in 4 weeks.

## 2010-07-26 NOTE — Medication Information (Signed)
Summary: [facility]: RX  Approved  [facility]: RX  Approved   Imported By: Florinda Marker 08/04/2009 14:20:43  _____________________________________________________________________  External Attachment:    Type:   Image     Comment:   External Document

## 2010-07-26 NOTE — Progress Notes (Signed)
Summary: wanting note for [employer]  Phone Note Call from Patient   Caller: Patient Reason for Call: Talk to Doctor Details for Reason: Has been attending [employer] since 11/07.  Starting weight 253, now 210.  Would like a note saying this weight is optimal for this pt.  With this note he will become a "life" member and not owe any more money to [employer].  Please advise.  RN would provide note for pt.  Initial call taken by: Jennet Maduro RN,  Nov 18, 2006 11:34 AM  Follow-up for Phone Call        Dr. Maurice March authorizes that Ethan Lambert's ideal weight is 210.   Lina Sayre, MD

## 2010-07-26 NOTE — Miscellaneous (Signed)
Summary: lab orders  Clinical Lists Changes  Orders: Added new Test order of T-CD4 [id number]) - Signed Added new Test order of T-HIV Viral Load [phone]) - Signed Added new Test order of T-Comprehensive Metabolic Panel [phone]) - Signed Added new Test order of T-CBC w/Diff ([id number]) - Signed Added new Test order of T-Lipid Profile ([id number]) - Signed Added new Test order of T-RPR (Syphilis) ([id number]) - Signed

## 2010-07-26 NOTE — Medication Information (Signed)
Summary: rov/jaj  Anticoagulant Therapy  Managed by: Bayard Hugger, PharmD Referring MD: Gala Romney PCP: Lina Sayre MD Supervising MD: Gala Romney MD, Daniel Indication 1: Atrial Fibrillation(427.31) Lab Used: [facility]  Site: [address] INR POC 3.1 INR RANGE 2.0-3.0  Dietary changes: no    Health status changes: no    Bleeding/hemorrhagic complications: yes       Details: pt. observed some blood in sperm, has spoken with MD about it  Recent/future hospitalizations: no    Any changes in medication regimen? no    Recent/future dental: no  Any missed doses?: no       Is patient compliant with meds? yes       Allergies: 1)  ! Morphine 2)  ! * Plastic Tape  Anticoagulation Management History:      Positive risk factors for bleeding include history of CVA/TIA.  Negative risk factors for bleeding include an age less than 79 years old.  The bleeding index is 'intermediate risk'.  Positive CHADS2 values include History of HTN and Prior Stroke/CVA/TIA.  Negative CHADS2 values include Age > 67 years old.  Anticoagulation responsible provider: Bensimhon MD, Reuel Boom.  INR POC: 3.1.  Cuvette Lot#: [id number].  Exp: 03/2011.    Anticoagulation Management Assessment/Plan:      The patient's current anticoagulation dose is Coumadin 5 mg tabs: Take as directed by coumadin clinic. 1 and 1/2 daily except one Tuesday when he takes one tablet..  The target INR is 2.0-3.0.  The next INR is due 04/24/2010.  Anticoagulation instructions were given to patient.  Results were reviewed/authorized by Bayard Hugger, PharmD.         Prior Anticoagulation Instructions: INR 3.1  Take 1 tablet tomorrow (Thursday). Then, take 1 1/2 tablets  everyday except take 1 tablet on Thursdays. Re-check INR in 3 weeks.   Current Anticoagulation Instructions: Take 1 tablet (5mg ) on Tuesday and Tursday, and take 1.5 tablets (7.5mg ) the rest of the days. Recheck INR on Nov. 1st.

## 2010-07-26 NOTE — Medication Information (Signed)
Summary: rov/tm  Anticoagulant Therapy  Managed by: Weston Brass, PharmD Referring MD: Gala Romney Supervising MD: Graciela Husbands MD, Viviann Spare Indication 1: Atrial Fibrillation(427.31) Lab Used: [facility] Site: [facility] INR POC 2.7 INR RANGE 2.0-3.0  Dietary changes: no    Health status changes: no    Bleeding/hemorrhagic complications: no    Recent/future hospitalizations: no    Any changes in medication regimen? no    Recent/future dental: no  Any missed doses?: no       Is patient compliant with meds? yes       Current Medications (verified): 1)  Atripla 600-200-300 Mg Tabs (Efavirenz-Emtricitab-Tenofovir) .... Take 1 Tablet By Mouth At Bedtime  (Pt's Id # [id number]) 2)  Cardizem Cd 120 Mg Xr24h-Cap (Diltiazem Hcl Coated Beads) .... Take One By Mouth Once Daily 3)  Coumadin 5 Mg Tabs (Warfarin Sodium) .... Take As Directed By Coumadin Clinic.  Allergies (verified): 1)  ! Morphine  Anticoagulation Management History:      The patient is taking warfarin and comes in today for a routine follow up visit.  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  Negative risk factors for bleeding include an age less than 72 years old.  The bleeding index is 'intermediate risk'.  Positive CHADS2 values include History of HTN and Prior Stroke/CVA/TIA.  Negative CHADS2 values include Age > 22 years old.  Anticoagulation responsible provider: Graciela Husbands MD, Viviann Spare.  INR POC: 2.7.  Cuvette Lot#: [id number].  Exp: 04/2010.    Anticoagulation Management Assessment/Plan:      The patient's current anticoagulation dose is Coumadin 5 mg tabs: Take as directed by coumadin clinic..  The target INR is 2.0-3.0.  The next INR is due 04/05/2009.  Anticoagulation instructions were given to patient.  Results were reviewed/authorized by Weston Brass, PharmD.  He was notified by Demetria Pore, PharmD candidate.         Prior Anticoagulation Instructions: INR 2.4   Continue on the same dose: 1.5 tablet  daily except 1 tablet on Tuesdays and Thursdays.   Recheck in 4 weeks  Current Anticoagulation Instructions: INR 2.7  Continue same dosing schedule below.

## 2010-07-26 NOTE — Assessment & Plan Note (Signed)
Summary: Cardiology Office Visit   Visit Type:  Follow-up/eph  CC:  irregular heart beat.  History of Present Illness: 50 y/o male with recent admission to [facility] after waking up one night with acute onset of dyspnea and tachypalps.  He was found to be in A. Fib and admitted and placed on IV Dilt and heparin.  He eventually converted on IV Dilt and was switched to p.o. 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  Echo in the hospital showed nl EF with negative bubble study.  Since d/c, he has had no recurrent a. fib or prolonged palpitations.  He denies c/p, sob, LH, syncope.  He is tolerating his meds well and has not noted any melena, brbpr, or easy bruising/bleeding.  Current Medications (verified): 1)  Atripla 600-200-300 Mg Tabs (Efavirenz-Emtricitab-Tenofovir) .... Take 1 Tablet By Mouth At Bedtime  (Pt's Id # [id number]) 2)  Cardizem Cd 120 Mg Xr24h-Cap (Diltiazem Hcl Coated Beads) .... Take One By Mouth Once Daily 3)  Coumadin 7.5 Mg Tabs (Warfarin Sodium) .... Mondays and Fridays and 5mg the Rest of Week  Allergies: 1)  ! Morphine  Past History:  Past Medical History: ATRIAL FIBRILLATION WITH RAPID VENTRICULAR RESPONSE (ICD-427.31)      a.  11/2008: Echo -  EF 55-65%.  Negative bubble study. HYPERTENSION (ICD-401.9) CVA (ICD-434.91)  -1990's ENCOUNTER FOR LONG-TERM USE OF OTHER MEDICATIONS (ICD-V58.69) TENDINITIS, ELBOW (ICD-727.09) HIV DISEASE (ICD-042)  Family History: Reviewed history from 12/08/2008 and no changes required.   Both of his parents are alive, his mother is 47 and his   dad is 52.  His dad has an arrhythmia, which is possibly atrial   fibrillation, and he has 1 brother who is alive in his late 31s with an   MI.   Social History: Reviewed history from 12/08/2008 and no changes required. Lives in [place] with his partner and works as a   Company secretary.  He has no history of alcohol, tobacco, or   drug abuse.  He has recently joint a gym and exercises 3 times a week.   He feels he eats a reasonably heart healthy diet but admits to   indiscretions with sweets.      Review of Systems       As per HPI.  OTW all systems reviewed and negative.  Vital Signs:  Patient profile:   50 year old male Height:      73 inches Weight:      235 pounds BMI:     31.12 Pulse rate:   74 / minute BP sitting:   136 / 87 Cuff size:   large  Vitals Entered By: Oswald Hillock (December 12, 2008 12:08 PM)  Physical Exam  General:  Well developed, well nourished, in no acute distress. Head:  HEENT: NL Neck:  supple, no bruits/jvd. Lungs:  Clear bilaterally to auscultation and percussion. Heart:  rrr. no s3/s4/murmurs. Abdomen:  Bowel sounds positive; abdomen soft and non-tender without masses, organomegaly, or hernias noted. No hepatosplenomegaly. Msk:  Back normal, normal gait. Muscle strength and tone normal. Pulses:  pulses normal in all 4 extremities Extremities:  No clubbing or cyanosis.  No edema. Neurologic:  Alert and oriented x 3. Skin:  Intact without lesions or rashes. Psych:  Normal affect.   Impression & Recommendations:  Problem # 1:  ATRIAL FIBRILLATION WITH RAPID VENTRICULAR RESPONSE (ICD-427.31)  Sinus restored in hospital.  Has maintained sinus.  Feels well.  Nl Echo in hospital.  TSH also normal.  Cont. current dose of Dilt.  He remains on coumadin and is followed in coumadin clinic. Orders: EKG w/ Interpretation (93000) Misc. Referral (Misc. Ref)  Problem # 2:  HYPERTENSION (ICD-401.9) Stable. Orders: EKG w/ Interpretation (93000) Misc. Referral (Misc. Ref)  Problem # 3:  HIV DISEASE (ICD-042) Followed by Dr. Maurice March.  Patient Instructions: 1)  Your physician recommends that you schedule a follow-up appointment in: 2- 3 months with Dr. Gala Romney 2)  Your physician has recommended that you have a sleep study.  .This test records several body functions during sleep, including:  brain activity, eye movement, oxygen and carbon dioxide blood levels, heart rate and rhythm, breathing rate and rhythm, the flow of air through your mouth and nose, snoring, body muscle movements, and chest and belly movement.

## 2010-07-26 NOTE — Letter (Signed)
Summary: [employer] Authorization  [employer] Authorization   Imported By: Kassie Mends 12/22/2008 08:31:25  _____________________________________________________________________  External Attachment:    Type:   Image     Comment:   External Document

## 2010-07-26 NOTE — Medication Information (Signed)
Summary: rov  js  Anticoagulant Therapy  Managed by: Shelby Dubin, PharmD, BCPS, CPP Referring MD: Gala Romney Supervising MD: Juanda Chance MD, Tawania Daponte Indication 1: Atrial Fibrillation(427.31) Lab Used: [facility] Site: [facility] PT 17.3 INR POC 2.0 INR RANGE 2.0-3.0  Dietary changes: no    Health status changes: no    Bleeding/hemorrhagic complications: no    Recent/future hospitalizations: no    Any changes in medication regimen? no    Recent/future dental: no  Any missed doses?: no       Is patient compliant with meds? yes       Current Medications (verified): 1)  Atripla 600-200-300 Mg Tabs (Efavirenz-Emtricitab-Tenofovir) .... Take 1 Tablet By Mouth At Bedtime  (Pt's Id # [id number]) 2)  Cardizem Cd 120 Mg Xr24h-Cap (Diltiazem Hcl Coated Beads) .... Take One By Mouth Once Daily 3)  Coumadin 5 Mg Tabs (Warfarin Sodium) .... Take As Directed By Coumadin Clinic.  Allergies (verified): 1)  ! Morphine  Anticoagulation Management History:      The patient is taking warfarin and comes in today for a routine follow up visit.  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  Negative risk factors for bleeding include an age less than 19 years old.  The bleeding index is 'intermediate risk'.  Positive CHADS2 values include History of HTN and Prior Stroke/CVA/TIA.  Negative CHADS2 values include Age > 46 years old.  Prothrombin time is 17.3.  Anticoagulation responsible provider: Juanda Chance MD, Smitty Cords.  INR POC: 2.0.  Cuvette Lot#: [id number].  Exp: 12/2009.    Anticoagulation Management Assessment/Plan:      The patient's current anticoagulation dose is Coumadin 5 mg tabs: Take as directed by coumadin clinic..  The target INR is 2.0-3.0.  The next INR is due 02/08/2009.  Anticoagulation instructions were given to patient.  Results were reviewed/authorized by Shelby Dubin, PharmD, BCPS, CPP.  He was notified by Cloyde Reams RN.         Prior Anticoagulation Instructions: INR =  2.1  GOAL:  2.0 - 3.0  The patient is to continue with the same dose of coumadin.  This dosage includes:  7.5mg  every day except 5mg  on Tuesdays & Thursdays.The patient is to stop coumadin.    Current Anticoagulation Instructions: INR 2.0  Continue on same dosage  1 and 1/2 tablets daily except 1 tablet on Tuesdays and Thursdays.  Recheck in 3 weeks.

## 2010-07-26 NOTE — Medication Information (Signed)
Summary: rov/sp  Anticoagulant Therapy  Managed by: Eda Keys, PharmD Referring MD: Gala Romney PCP: Lina Sayre MD Supervising MD: Clifton James MD, Cristal Deer Indication 1: Atrial Fibrillation(427.31) Lab Used: [facility] Site: [address] INR POC 3.1 INR RANGE 2.0-3.0  Dietary changes: no    Health status changes: no    Bleeding/hemorrhagic complications: no    Recent/future hospitalizations: no    Any changes in medication regimen? no    Recent/future dental: no  Any missed doses?: yes     Details: Only took 1 tablet instead of 1.5 because he was on vacation and having cocktails that night ( ~ 1 week ago)  Is patient compliant with meds? yes       Allergies: 1)  ! Morphine 2)  ! * Plastic Tape  Anticoagulation Management History:      The patient is taking warfarin and comes in today for a routine follow up visit.  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  Negative risk factors for bleeding include an age less than 34 years old.  The bleeding index is 'intermediate risk'.  Positive CHADS2 values include History of HTN and Prior Stroke/CVA/TIA.  Negative CHADS2 values include Age > 55 years old.  Anticoagulation responsible provider: Clifton James MD, Cristal Deer.  INR POC: 3.1.  Cuvette Lot#: [id number].  Exp: 04/2011.    Anticoagulation Management Assessment/Plan:      The patient's current anticoagulation dose is Coumadin 5 mg tabs: Take as directed by coumadin clinic. 1 and 1/2 daily except one Tuesday when he takes one tablet..  The target INR is 2.0-3.0.  The next INR is due 03/26/2010.  Anticoagulation instructions were given to patient.  Results were reviewed/authorized by Eda Keys, PharmD.  He was notified by Harrel Carina, PharmD candidate.         Prior Anticoagulation Instructions: INR 3.1  Take 1/2 tablet tomorrow then resume same dose of 1 1/2 tablets every day.  Recheck INR in 4 weeks.   Current Anticoagulation  Instructions: INR 3.1  Take 1 tablet tomorrow (Thursday). Then, take 1 1/2 tablets  everyday except take 1 tablet on Thursdays. Re-check INR in 3 weeks.

## 2010-07-26 NOTE — Letter (Signed)
Summary: Generic Letter  [facility]  [address]   [place], [place] [address]   Phone: [phone]  Fax:     12/10/2006 MRN: [id number]  [address] [place], [place]  [address]  Dear Weight Watcher's,  Ethan Lambert has achieved his optimal weight.  His physician, Dr. Lina Sayre believes that this is appropriate for [name] and he should continue in your program to maintain his weight control.  Thank you.   Sincerely,    Dr. Marcial Pacas Lane/Denise Lennox Laity RN [facility]

## 2010-07-26 NOTE — Progress Notes (Signed)
Summary: Atripla script phoned in 07-21-07/mld  Phone Note Call from Patient   Reason for Call: Refill Medication Details for Reason: need refill on Atripla for a year Summary of Call: Patient called to see if we can [facility] in [facility].  He sees Dr. Maurice March and will need a prescription for one year.  [facility] is [phone].  Md line for [facility] is : [phone] opt 3 Patient's ID: [id number] Initial call taken by: Paulo Fruit,  July 21, 2007 2:55 PM      Prescriptions: ATRIPLA 600-200-300 MG TABS (EFAVIRENZ-EMTRICITAB-TENOFOVIR) Take 1 tablet by mouth at bedtime  #90 x 3   Entered by:   Paulo Fruit   Authorized by:   Lina Sayre MD   Signed by:   Paulo Fruit on 07/21/2007   Method used:   Telephoned to ...       [facility]       [address] [address]       [place], [place]  [address]       Ph:        Fax: [phone]   RxID:   [id number]  ...................................................................Paulo Fruit  July 21, 2007 3:24 PM

## 2010-07-26 NOTE — Letter (Signed)
Summary: Generic Letter  [facility]  [address]   [place], [place] [address]   Phone: [phone]  Fax:     11/18/2006  [address] [place], [place]  [address]   To Whom It May Concern,  I authorize that Ethan Lambert's ideal weight is 210 lbs.    Sincerely,   Fransisco Hertz, MD [facility]

## 2010-07-26 NOTE — Progress Notes (Signed)
Summary: sleep study results  Phone Note Outgoing Call   Call placed by: Meredith Staggers, RN,  February 16, 2009 3:40 PM Summary of Call: called pt w/results from sleep study, Left message to call back   Follow-up for Phone Call        gave pt results, he states while at work today he had to have some labwork for his insurance and his bp was 140/90, he will begin checking it when he has time and will let us know if it is cont. to staty elevated Meredith Staggers, RN  February 16, 2009 4:12 PM

## 2010-07-26 NOTE — Medication Information (Signed)
Summary: rov/lr  Anticoagulant Therapy  Managed by: Minerva Areola, RN, BSN Referring MD: Etheleen Sia MD: Ladona Ridgel MD, Sharlot Gowda Indication 1: Atrial Fibrillation(427.31) [place] Site: [address] PT 17.9 INR POC 2.1  Dietary changes: no    Health status changes: no    Bleeding/hemorrhagic complications: no    Recent/future hospitalizations: no    Any changes in medication regimen? no    Recent/future dental: no  Any missed doses?: no       Is patient compliant with meds? yes       Current Medications (verified): 1)  Atripla 600-200-300 Mg Tabs (Efavirenz-Emtricitab-Tenofovir) .... Take 1 Tablet By Mouth At Bedtime  (Pt's Id # [id number]) 2)  Cardizem Cd 120 Mg Xr24h-Cap (Diltiazem Hcl Coated Beads) .... Take One By Mouth Once Daily 3)  Coumadin 7.5 Mg Tabs (Warfarin Sodium) .... Mondays and Fridays and 5mg the Rest of Week  Allergies (verified): 1)  ! Morphine  Anticoagulation Management History:      The patient is taking warfarin and comes in today for a routine follow up visit.  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  Negative risk factors for bleeding include an age less than 80 years old.  The bleeding index is 'intermediate risk'.  Positive CHADS2 values include History of HTN and Prior Stroke/CVA/TIA.  Negative CHADS2 values include Age > 57 years old.  Prothrombin time is 17.9.  Anticoagulation responsible provider: Ladona Ridgel MD, Sharlot Gowda.  INR POC: 2.1.  Cuvette Lot#: [id number].  Exp: 12/2009.    Anticoagulation Management Assessment/Plan:      The patient's current anticoagulation dose is Coumadin 7.5 mg tabs: mondays and fridays and 5mg the rest of week.  The target INR is 2.0-3.0.  The next INR is due 01/18/2009.  Anticoagulation instructions were given to patient.  Results were reviewed/authorized by Minerva Areola, RN, BSN.  He was notified by Minerva Areola, RN, BSN.         Prior Anticoagulation Instructions: INR 1.8  today Increase coumadin to 7.5mg  once daily except 5mg  on Tuesdays and Thursdays  Current Anticoagulation Instructions: INR = 2.1  GOAL:  2.0 - 3.0  The patient is to continue with the same dose of coumadin.  This dosage includes:  7.5mg  every day except 5mg  on Tuesdays & Thursdays.The patient is to stop coumadin.

## 2010-07-26 NOTE — Miscellaneous (Signed)
Summary: needs pneumovax on next visit  Clinical Lists Changes

## 2010-07-26 NOTE — Medication Information (Signed)
Summary: rov/tm  Anticoagulant Therapy  Managed by: Bethena Midget, RN, BSN Referring MD: Gala Romney PCP: Lina Sayre MD Supervising MD: Juanda Chance MD, Charlett Merkle Indication 1: Atrial Fibrillation(427.31) Lab Used: [facility] Site: [address] INR POC 2.1 INR RANGE 2.0-3.0  Dietary changes: no    Health status changes: no    Bleeding/hemorrhagic complications: no    Recent/future hospitalizations: no    Any changes in medication regimen? no    Recent/future dental: no  Any missed doses?: no       Is patient compliant with meds? yes      Comments: Seeing Dermatology on Friday, possible future procedure.   Allergies: 1)  ! Morphine  Anticoagulation Management History:      The patient is taking warfarin and comes in today for a routine follow up visit.  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  Negative risk factors for bleeding include an age less than 49 years old.  The bleeding index is 'intermediate risk'.  Positive CHADS2 values include History of HTN and Prior Stroke/CVA/TIA.  Negative CHADS2 values include Age > 47 years old.  Anticoagulation responsible provider: Juanda Chance MD, Smitty Cords.  INR POC: 2.1.  Cuvette Lot#: [id number].  Exp: 09/2010.    Anticoagulation Management Assessment/Plan:      The patient's current anticoagulation dose is Coumadin 5 mg tabs: Take as directed by coumadin clinic. 1 and 1/2 daily except one Tuesday when he takes one tablet..  The target INR is 2.0-3.0.  The next INR is due 08/16/2009.  Anticoagulation instructions were given to patient.  Results were reviewed/authorized by Bethena Midget, RN, BSN.  He was notified by Bethena Midget, RN, BSN.         Prior Anticoagulation Instructions: INR 1.5 Continue with same dosage 7.5mg  daily except on Tuesday. Call office if new medication is added to regimen by PCP. follow up with previous appointment date  Current Anticoagulation Instructions: INR 2.1 Continue 7.5mg s everyday except 5mg s on  Tuesdays. Recheck in 4 weeks.

## 2010-07-26 NOTE — Progress Notes (Signed)
Summary: Derm ref  Phone Note Call from Patient   Summary of Call: Requesting Derm referral he has a area on his shoulder of concern. Initial call taken by: Tomasita Morrow RN,  December 01, 2007 3:11 PM  Follow-up for Phone Call        needs an appt for eval Follow-up by: Yisroel Ramming MD,  December 01, 2007 3:36 PM  Additional Follow-up for Phone Call Additional follow up Details #1::        Provider Notified. Messge left on machine. Call for appt  Additional Follow-up by: Tomasita Morrow RN,  December 02, 2007 10:59 AM

## 2010-07-26 NOTE — Assessment & Plan Note (Signed)
Summary: FU VISIT/[MKJ]   Primary Provider:  Lina Sayre MD  CC:  f/u visit and has been seeing Dr. Adella Hare for cardiac issues over the past year.  History of Present Illness: Ethan Lambert's HIV is rock stable and suppressed on Atripla. His most recent CD4 4 months ago was 700 and his HIV was undectable, ie <48 copies of HIV RNA/ml of plasma. He contines to followup closely with Dr. Milas Kocher for recurrent bouts of A fib. He has not had palpitations in the past 4 weeks.   Preventive Screening-Counseling & Management  Alcohol-Tobacco     Alcohol drinks/day: 0     Alcohol type: beer     Smoking Status: never     Passive Smoke Exposure: yes  Caffeine-Diet-Exercise     Caffeine use/day: 0     Does Patient Exercise: yes     Type of exercise: gym membership     Exercise (avg: min/session): 30-60     Times/week: 3  Hep-HIV-STD-Contraception     HIV Risk: no risk noted  Safety-Violence-Falls     Seat Belt Use: yes   Current Allergies (reviewed today): ! MORPHINE ! * PLASTIC TAPE Vital Signs:  Patient profile:   50 year old male Height:      73 inches (185.42 cm) Weight:      249.5 pounds (113.41 kg) BMI:     33.04 Temp:     98.7 degrees F (37.06 degrees C) oral Pulse rate:   74 / minute BP sitting:   144 / 94  (left arm) Cuff size:   large  Vitals Entered By: Jennet Maduro RN (November 30, 2009 3:43 PM) CC: f/u visit, has been seeing Dr. Adella Hare for cardiac issues over the past year Is Patient Diabetic? No Pain Assessment Patient in pain? no      Nutritional Status BMI of > 30 = obese Nutritional Status Detail appetite "too good"  Have you ever been in a relationship where you felt threatened, hurt or afraid?No   Does patient need assistance? Functional Status Self care Ambulation Normal Comments may have missed only night of rxes   Physical Exam  General:  Well-developed,well-nourished,in no acute distress; alert,appropriate and cooperative throughout  examination Head:  Normocephalic and atraumatic without obvious abnormalities. No apparent alopecia or balding. Mouth:  Oral mucosa and oropharynx without lesions or exudates.  Teeth in good repair. Heart:  Regular rh thym and no murmurs   Impression & Recommendations:  Problem # 1:  HIV DISEASE (ICD-042) Continue Atripla and will refer for hernia evaluation that Dr. Ninetta Lights discovered. Will refer to Dr. Estelle Grumbles. Orders: Est. Patient Level IV ([address]) T-CBC w/Diff [phone]) T-CD4SP [facility]) (CD4SP) T-Comprehensive Metabolic Panel [phone]) T-HIV Viral Load [id number] Orders: T-Lipid Profile ([id number]) ... 05/07/2010 T-CBC w/Diff ([id number]) ... 05/07/2010 T-CD4SP ([facility]) (CD4SP) ... 05/07/2010 T-Comprehensive Metabolic Panel [phone]) ... 05/07/2010 T-HIV Viral Load [phone]) ... 05/07/2010  Patient Instructions: 1)  Please schedule a follow-up appointment in 4-5 months. 2)  Be sure to return for lab work one (2) week before your next appointment as scheduled.  Process Orders Check Orders Results:     [facility]: ABN not required for this insurance Tests Sent for requisitioning (November 30, 2009 5:43 PM):     11/30/2009: [facility] -- T-CBC w/Diff [[id number]] (signed)     11/30/2009: [facility] -- T-Comprehensive Metabolic Panel [[id number]] (signed)     11/30/2009: [facility] -- T-HIV Viral Load [phone] (signed)  05/07/2010: [facility] -- T-Lipid Profile [phone] (signed)     05/07/2010: [facility] -- T-CBC w/Diff [[id number]] (signed)     05/07/2010: [facility] -- T-Comprehensive Metabolic Panel [[id number]] (signed)     05/07/2010: [facility] -- T-HIV Viral Load [phone] (signed)    Appended Document: FU VISIT, surgical referral for varicocele    Appended Document: Orders  Update    Clinical Lists Changes  Problems: Added new problem 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

## 2010-07-26 NOTE — Medication Information (Signed)
Summary: rov/tm  Anticoagulant Therapy  Managed by: Weston Brass, PharmD Referring MD: Gala Romney PCP: Lina Sayre MD Supervising MD: Jens Som MD, Arlys John Indication 1: Atrial Fibrillation(427.31) Lab Used: [facility] Site: [facility] INR POC 2.3 INR RANGE 2.0-3.0  Dietary changes: no    Health status changes: yes       Details: one episode of afib but resolved with flecainide  Bleeding/hemorrhagic complications: no    Recent/future hospitalizations: no    Any changes in medication regimen? no    Recent/future dental: no  Any missed doses?: no       Is patient compliant with meds? yes       Allergies: 1)  ! Morphine 2)  ! * Plastic Tape  Anticoagulation Management History:      The patient is taking warfarin and comes in today for a routine follow up visit.  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  Negative risk factors for bleeding include an age less than 58 years old.  The bleeding index is 'intermediate risk'.  Positive CHADS2 values include History of HTN and Prior Stroke/CVA/TIA.  Negative CHADS2 values include Age > 12 years old.  Anticoagulation responsible provider: Jens Som MD, Arlys John.  INR POC: 2.3.  Cuvette Lot#: [id number].  Exp: 07/2011.    Anticoagulation Management Assessment/Plan:      The patient's current anticoagulation dose is Coumadin 5 mg tabs: Take as directed by coumadin clinic. 1 and 1/2 daily except one Tuesday when he takes one tablet..  The target INR is 2.0-3.0.  The next INR is due 07/19/2010.  Anticoagulation instructions were given to patient.  Results were reviewed/authorized by Weston Brass, PharmD.  He was notified by Weston Brass PharmD.         Prior Anticoagulation Instructions: INR 2.1 Continue 7.5mg s daily except 5mg s on Tuesdays and Thursdays. Recheck in 4 weeks.   Current Anticoagulation Instructions: INR 2.3  Continue same dose of 1 1/2 tablets every day except 1 tablet on Tuesday and Thursday.  Recheck INR in  4 weeks.

## 2010-07-26 NOTE — Medication Information (Signed)
Summary: rov/tm  Anticoagulant Therapy  Managed by: Vashti Hey, RN Referring MD: Gala Romney Supervising MD: Dolphus Linch MD, Fayrene Fearing Indication 1: Atrial Fibrillation PT 16.5 INR POC 1.8  Dietary changes: no    Health status changes: no    Bleeding/hemorrhagic complications: no    Recent/future hospitalizations: no    Any changes in medication regimen? no    Recent/future dental: no  Any missed doses?: no       Is patient compliant with meds? yes       Allergies (verified): 1)  ! Morphine  Anticoagulation Management History:      The patient is taking warfarin and comes in today for a routine follow up visit.  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  Negative risk factors for bleeding include an age less than 27 years old.  The bleeding index is 'intermediate risk'.  Positive CHADS2 values include History of HTN and Prior Stroke/CVA/TIA.  Negative CHADS2 values include Age > 53 years old.  Prothrombin time is 16.5.  Anticoagulation responsible provider: Jalysa Swopes MD, Fayrene Fearing.  INR POC: 1.8.  Cuvette Lot#: [id number].  Exp: 12/2009.    Anticoagulation Management Assessment/Plan:      The patient's current anticoagulation dose is Coumadin 7.5 mg tabs: mondays and fridays and 5mg the rest of week.  The target INR is 2.0-3.0.  The next INR is due 01/11/2009.  Anticoagulation instructions were given to patient.  Results were reviewed/authorized by Vashti Hey, RN.  He was notified by Vashti Hey RN.         Prior Anticoagulation Instructions: INR today 1.6  Take an extra 1/2 tablet today (6/28) then follow the schedule of 7.5 mg everyday except take 5 mg on Sundays, Tuesdays and Thursdays   Current Anticoagulation Instructions: INR 1.8 today Increase coumadin to 7.5mg  once daily except 5mg  on Tuesdays and Thursdays

## 2010-07-26 NOTE — Medication Information (Signed)
Summary: rov/tp  Anticoagulant Therapy  Managed by: Geoffry Paradise, PharmD Referring MD: Gala Romney PCP: Lina Sayre MD Supervising MD: Myrtis Ser MD, Tinnie Gens Indication 1: Atrial Fibrillation(427.31) Lab Used: [facility] Site: [facility] INR POC 2 INR RANGE 2.0-3.0  Dietary changes: no    Health status changes: no    Bleeding/hemorrhagic complications: no    Recent/future hospitalizations: no    Any changes in medication regimen? no    Recent/future dental: no  Any missed doses?: no       Is patient compliant with meds? yes       Allergies: 1)  ! Morphine 2)  ! * Plastic Tape  Anticoagulation Management History:      The patient is taking warfarin and comes in today for a routine follow up visit.  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  Negative risk factors for bleeding include an age less than 61 years old.  The bleeding index is 'intermediate risk'.  Positive CHADS2 values include History of HTN and Prior Stroke/CVA/TIA.  Negative CHADS2 values include Age > 9 years old.  Anticoagulation responsible provider: Myrtis Ser MD, Tinnie Gens.  INR POC: 2.  Cuvette Lot#: [id number].  Exp: 06/2011.    Anticoagulation Management Assessment/Plan:      The patient's current anticoagulation dose is Coumadin 5 mg tabs: Take as directed by coumadin clinic. 1 and 1/2 daily except one Tuesday when he takes one tablet..  The target INR is 2.0-3.0.  The next INR is due 08/16/2010.  Anticoagulation instructions were given to patient.  Results were reviewed/authorized by Geoffry Paradise, PharmD.         Prior Anticoagulation Instructions: INR 2.3  Continue same dose of 1 1/2 tablets every day except 1 tablet on Tuesday and Thursday.  Recheck INR in 4 weeks.   Current Anticoagulation Instructions: INR:  2 (goal 2-3)  Your INR is at goal today.  Continue taking 7.5 mg everyday EXCEPT for 5 mg on Tuesdays and Thursdays.  Return to clinic in 4 weeks for another INR check.

## 2010-07-26 NOTE — Progress Notes (Signed)
Summary: refill request  Phone Note Refill Request Message from:  Patient on June 27, 2010 3:18 PM  Refills Requested: Medication #1:  COUMADIN 5 MG TABS Take as directed by coumadin clinic. 1 and 1/2 daily except one Tuesday when he takes one tablet[facility]   Method Requested: Telephone to Pharmacy Initial call taken by: Glynda Jaeger,  June 27, 2010 3:18 PM    Prescriptions: COUMADIN 5 MG TABS (WARFARIN SODIUM) Take as directed by coumadin clinic. 1 and 1/2 daily except one Tuesday when he takes one tablet.  #135 x 3   Entered by:   Bethena Midget, RN, BSN   Authorized by:   Dolores Patty, MD, [facility]   Signed by:   Bethena Midget, RN, BSN on 06/27/2010   Method used:   Faxed to ...       [facility] (mail-order)       [address]       [place], [place]  [address]       Ph: [phone]       Fax: [phone]   RxID:   [id number]

## 2010-07-26 NOTE — Medication Information (Signed)
Summary: rov/vb  Anticoagulant Therapy  Managed by: Bethena Midget, RN, BSN Referring MD: Gala Romney PCP: Lina Sayre MD Supervising MD: Shirlee Latch MD, Sakira Dahmer Indication 1: Atrial Fibrillation(427.31) Lab Used: [facility] Site: [address] INR POC 1.9 INR RANGE 2.0-3.0  Dietary changes: no    Health status changes: no    Bleeding/hemorrhagic complications: yes       Details: scant amt. noted during nosebleed.  Recent/future hospitalizations: yes       Details: on 05/15/09 pt. went to [facility] for rapid HR- Flecanide given to take if he experinces any more palpiations.  Any changes in medication regimen? no    Recent/future dental: no  Any missed doses?: no       Is patient compliant with meds? yes       Allergies: 1)  ! Morphine  Anticoagulation Management History:      The patient is taking warfarin and comes in today for a routine follow up visit.  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  Negative risk factors for bleeding include an age less than 58 years old.  The bleeding index is 'intermediate risk'.  Positive CHADS2 values include History of HTN and Prior Stroke/CVA/TIA.  Negative CHADS2 values include Age > 72 years old.  Anticoagulation responsible provider: Shirlee Latch MD, Ryleeann Urquiza.  INR POC: 1.9.  Cuvette Lot#: [id number].  Exp: 06/2009.    Anticoagulation Management Assessment/Plan:      The patient's current anticoagulation dose is Coumadin 5 mg tabs: Take as directed by coumadin clinic..  The target INR is 2.0-3.0.  The next INR is due 06/21/2009.  Anticoagulation instructions were given to patient.  Results were reviewed/authorized by Bethena Midget, RN, BSN.  He was notified by Bethena Midget, RN, BSN.         Prior Anticoagulation Instructions: INR: 2.1  Continue the current regimen of 1 1/2 daily except 1 tablet on Tuesdays and Thursdays.  Recheck in 4 weeks.  Current Anticoagulation Instructions: INR 1.9 Change dose to 1.5 tablets everyday except  on Tuesdays take 1 tablet. Recheck in 3 weeks.

## 2010-07-26 NOTE — Medication Information (Signed)
Summary: rov/tm  Anticoagulant Therapy  Managed by: Cloyde Reams, RN, BSN Referring MD: Gala Romney PCP: Lina Sayre MD Supervising MD: Johney Frame MD, Fayrene Fearing Indication 1: Atrial Fibrillation(427.31) Lab Used: [facility]  Site: [address] INR POC 2.6 INR RANGE 2.0-3.0  Dietary changes: no    Health status changes: no    Bleeding/hemorrhagic complications: no    Recent/future hospitalizations: no    Any changes in medication regimen? yes       Details: Incr BP med to 240mg  Cardizem daily.  Recent/future dental: no  Any missed doses?: no       Is patient compliant with meds? yes       Current Medications (verified): 1)  Atripla 600-200-300 Mg Tabs (Efavirenz-Emtricitab-Tenofovir) .... Take 1 Tablet By Mouth At Bedtime  (Pt's Id # [id number]) 2)  Coumadin 5 Mg Tabs (Warfarin Sodium) .... Take As Directed By Coumadin Clinic. 1 and 1/2 Daily Except One Tuesday When He Takes One Tablet. 3)  Tambocor 150 Mg Tabs (Flecainide Acetate) .... Take Two As Needed 4)  Cardizem Cd 240 Mg Xr24h-Cap (Diltiazem Hcl Coated Beads) .[name].. 1 Cap Every Day  Allergies: 1)  ! Morphine 2)  ! * Plastic Tape  Anticoagulation Management History:      The patient is taking warfarin and comes in today for a routine follow up visit.  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  Negative risk factors for bleeding include an age less than 54 years old.  The bleeding index is 'intermediate risk'.  Positive CHADS2 values include History of HTN and Prior Stroke/CVA/TIA.  Negative CHADS2 values include Age > 47 years old.  Anticoagulation responsible provider: Brylee Berk MD, Fayrene Fearing.  INR POC: 2.6.  Cuvette Lot#: [id number].  Exp: 10/2010.    Anticoagulation Management Assessment/Plan:      The patient's current anticoagulation dose is Coumadin 5 mg tabs: Take as directed by coumadin clinic. 1 and 1/2 daily except one Tuesday when he takes one tablet..  The target INR is 2.0-3.0.  The next INR is due  11/01/2009.  Anticoagulation instructions were given to patient.  Results were reviewed/authorized by Cloyde Reams, RN, BSN.  He was notified by Cloyde Reams RN.         Prior Anticoagulation Instructions: INR 2.0 Change dose to 7.5mg s everyday.Recheck in 3 wks.   Current Anticoagulation Instructions: INR 2.6  Continue on same dosage 1.5 tablets (7.5mg ) daily.  Recheck in 4 weeks.

## 2010-07-26 NOTE — Medication Information (Signed)
Summary: rov/tm  Anticoagulant Therapy  Managed by: Eda Keys, PharmD Referring MD: Gala Romney PCP: Lina Sayre MD Supervising MD: Ladona Ridgel MD, Sharlot Gowda Indication 1: Atrial Fibrillation(427.31) Lab Used: [facility] Site: [address] INR POC 2.3 INR RANGE 2.0-3.0  Dietary changes: no    Health status changes: no    Bleeding/hemorrhagic complications: no    Recent/future hospitalizations: no    Any changes in medication regimen? no    Recent/future dental: no  Any missed doses?: no       Is patient compliant with meds? yes       Allergies: 1)  ! Morphine 2)  ! * Plastic Tape  Anticoagulation Management History:      The patient is taking warfarin and comes in today for a routine follow up visit.  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  Negative risk factors for bleeding include an age less than 77 years old.  The bleeding index is 'intermediate risk'.  Positive CHADS2 values include History of HTN and Prior Stroke/CVA/TIA.  Negative CHADS2 values include Age > 62 years old.  Anticoagulation responsible provider: Ladona Ridgel MD, Sharlot Gowda.  INR POC: 2.3.  Cuvette Lot#: [id number].  Exp: 09/2010.    Anticoagulation Management Assessment/Plan:      The patient's current anticoagulation dose is Coumadin 5 mg tabs: Take as directed by coumadin clinic. 1 and 1/2 daily except one Tuesday when he takes one tablet..  The target INR is 2.0-3.0.  The next INR is due 09/13/2009.  Anticoagulation instructions were given to patient.  Results were reviewed/authorized by Eda Keys, PharmD.  He was notified by Eda Keys.         Prior Anticoagulation Instructions: INR 2.1 Continue 7.5mg s everyday except 5mg s on Tuesdays. Recheck in 4 weeks.   Current Anticoagulation Instructions: INR 2.3  Continue taking 1 tablet on Tuesday and 1.5 tablets all other days.  Return to clinic in 4 weeks.

## 2010-07-26 NOTE — Medication Information (Signed)
Summary: new to coumadin  Anticoagulant Therapy  Managed by: Minerva Areola, RN, BSN Referring MD: Etheleen Sia MD: Antoine Poche MD, Fayrene Fearing Indication 1: Atrial Fibrillation PT 14.4 INR POC 1.4  Dietary changes: no    Health status changes: no    Bleeding/hemorrhagic complications: no    Recent/future hospitalizations: yes       Details: discharged on 12/01/08; was there for about 10 hours  Any changes in medication regimen? yes       Details: added coumadin & cardizem CD  Recent/future dental: no  Any missed doses?: no       Is patient compliant with meds? yes       Current Medications (verified): 1)  Atripla 600-200-300 Mg Tabs (Efavirenz-Emtricitab-Tenofovir) .... Take 1 Tablet By Mouth At Bedtime  (Pt's Id # [id number]) 2)  Cardizem Cd 120 Mg Xr24h-Cap (Diltiazem Hcl Coated Beads) .... Take One By Mouth Once Daily 3)  Coumadin 5 Mg Tabs (Warfarin Sodium) .... Take As Directed By Coumadin Clinic  Allergies: 1)  ! Morphine  Anticoagulation Management History:      The patient is on coumadin and comes in today for a routine follow up visit.  Negative risk factors for bleeding include an age less than 52 years old.  The bleeding index is 'low risk'.  Negative CHADS2 values include Age > 61 years old.    Anticoagulation Management Assessment/Plan:      The patient's current anticoagulation dose is Coumadin 5 mg tabs: take as directed by coumadin clinic.  He is to have a 12/09/2008.  Anticoagulation instructions were given to patient.  Results were reviewed/authorized by Minerva Areola, RN, BSN.  He was notified by Minerva Areola, RN, BSN.         Current Anticoagulation Instructions: INR = 1.4     (Goal 2. 0- 3.0)  The patient is to continue with the same dose of coumadin.  This dosage includes:   5mg  daily  Come back on Friday 12/09/08/

## 2010-07-26 NOTE — Medication Information (Signed)
Summary: rov/mb  Anticoagulant Therapy  Managed by: Cloyde Reams, RN, BSN Referring MD: Gala Romney PCP: Lina Sayre MD Supervising MD: Clifton James MD, Cristal Deer Indication 1: Atrial Fibrillation(427.31) Lab Used: [facility] Site: [address] INR POC 2.5 INR RANGE 2.0-3.0  Dietary changes: no    Health status changes: no    Bleeding/hemorrhagic complications: no    Recent/future hospitalizations: no    Any changes in medication regimen? yes       Details: Took a dose of Fleccanide Friday pm d/t afib with relief.  Recent/future dental: no  Any missed doses?: no       Is patient compliant with meds? yes       Allergies: 1)  ! Morphine 2)  ! * Plastic Tape  Anticoagulation Management History:      The patient is taking warfarin and comes in today for a routine follow up visit.  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  Negative risk factors for bleeding include an age less than 27 years old.  The bleeding index is 'intermediate risk'.  Positive CHADS2 values include History of HTN and Prior Stroke/CVA/TIA.  Negative CHADS2 values include Age > 43 years old.  Anticoagulation responsible provider: Clifton James MD, Cristal Deer.  INR POC: 2.5.  Exp: 03/2011.    Anticoagulation Management Assessment/Plan:      The patient's current anticoagulation dose is Coumadin 5 mg tabs: Take as directed by coumadin clinic. 1 and 1/2 daily except one Tuesday when he takes one tablet..  The target INR is 2.0-3.0.  The next INR is due 05/22/2010.  Anticoagulation instructions were given to patient.  Results were reviewed/authorized by Cloyde Reams, RN, BSN.  He was notified by Cloyde Reams RN.         Prior Anticoagulation Instructions: Take 1 tablet (5mg ) on Tuesday and Tursday, and take 1.5 tablets (7.5mg ) the rest of the days. Recheck INR on Nov. 1st.  Current Anticoagulation Instructions: INR 2.5  Continue on same dosage 1.5 tablets daily except 1 tablet on Tuesdays  and Thursdays.  Recheck in 4 weeks.

## 2010-07-26 NOTE — Letter (Signed)
Summary: Handout Printed  Printed Handout:  - Coumadin Instructions 

## 2010-07-26 NOTE — Medication Information (Signed)
Summary: rov/tm  Anticoagulant Therapy  Managed by: Weston Brass, PharmD Referring MD: Gala Romney PCP: Lina Sayre MD Supervising MD: Juanda Chance MD, Husayn Reim Indication 1: Atrial Fibrillation(427.31) Lab Used: [facility] Site: [address] INR POC 2.9 INR RANGE 2.0-3.0  Dietary changes: no    Health status changes: no    Bleeding/hemorrhagic complications: no    Recent/future hospitalizations: no    Any changes in medication regimen? no    Recent/future dental: no  Any missed doses?: no       Is patient compliant with meds? yes       Allergies: 1)  ! Morphine 2)  ! * Plastic Tape  Anticoagulation Management History:      The patient is taking warfarin and comes in today for a routine follow up visit.  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  Negative risk factors for bleeding include an age less than 23 years old.  The bleeding index is 'intermediate risk'.  Positive CHADS2 values include History of HTN and Prior Stroke/CVA/TIA.  Negative CHADS2 values include Age > 46 years old.  Anticoagulation responsible provider: Juanda Chance MD, Smitty Cords.  INR POC: 2.9.  Cuvette Lot#: [id number].  Exp: 01/2011.    Anticoagulation Management Assessment/Plan:      The patient's current anticoagulation dose is Coumadin 5 mg tabs: Take as directed by coumadin clinic. 1 and 1/2 daily except one Tuesday when he takes one tablet..  The target INR is 2.0-3.0.  The next INR is due 01/03/2010.  Anticoagulation instructions were given to patient.  Results were reviewed/authorized by Weston Brass, PharmD.  He was notified by Weston Brass PharmD.         Prior Anticoagulation Instructions: INR 2.6 Continue 7.5mg s daily. Recheck in 4 weeks.   Current Anticoagulation Instructions: INR 2.9  Continue same dose of 1 1/2 tablets every day.

## 2010-07-26 NOTE — Medication Information (Signed)
Summary: ccr/kfw  Anticoagulant Therapy  Managed by: edna zamparelli pharm D candidate Referring MD: Bensimhon PCP: Lina Sayre MD Supervising MD: Riley Kill MD, Maisie Fus Indication 1: Atrial Fibrillation(427.31) Lab Used: [facility] Site: [address] INR POC 1.5 INR RANGE 2.0-3.0  Dietary changes: no    Health status changes: no    Bleeding/hemorrhagic complications: no    Recent/future hospitalizations: no    Any changes in medication regimen? no    Recent/future dental: no  Any missed doses?: yes     Details: missed yesterday and today's doses due to blood in the semens  Is patient compliant with meds? yes       Current Medications (verified): 1)  Atripla 600-200-300 Mg Tabs (Efavirenz-Emtricitab-Tenofovir) .... Take 1 Tablet By Mouth At Bedtime  (Pt's Id # [id number]) 2)  Cardizem Cd 120 Mg Xr24h-Cap (Diltiazem Hcl Coated Beads) .... Take One By Mouth Once Daily 3)  Coumadin 5 Mg Tabs (Warfarin Sodium) .... Take As Directed By Coumadin Clinic. 4)  Tambocor 150 Mg Tabs (Flecainide Acetate) .... Take Two As Needed  Allergies (verified): 1)  ! Morphine  Anticoagulation Management History:      The patient is taking warfarin and comes in today for a routine follow up visit.  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  Negative risk factors for bleeding include an age less than 97 years old.  The bleeding index is 'intermediate risk'.  Positive CHADS2 values include History of HTN and Prior Stroke/CVA/TIA.  Negative CHADS2 values include Age > 47 years old.  Anticoagulation responsible provider: Riley Kill MD, Maisie Fus.  INR POC: 1.5.  Cuvette Lot#: [id number].  Exp: 07/2010.    Anticoagulation Management Assessment/Plan:      The patient's current anticoagulation dose is Coumadin 5 mg tabs: Take as directed by coumadin clinic..  The target INR is 2.0-3.0.  The next INR is due 07/19/2009.  Anticoagulation instructions were given to patient.  Results were  reviewed/authorized by edna zamparelli pharm D candidate.  He was notified by Rolland Porter.         Prior Anticoagulation Instructions: INR 2.1 Continue 7.5mg s daily except 5mg s on Tuesdays. Recheck in 4 weeks.  Current Anticoagulation Instructions: INR 1.5 Continue with same dosage 7.5mg  daily except on Tuesday. Call office if new medication is added to regimen by PCP. follow up with previous appointment date

## 2010-07-26 NOTE — Assessment & Plan Note (Signed)
Summary: yearly checkup [mkj]   Chief Complaint:  f/u visit, 1 mm spot on right shoulder, bled a couple weeks ago, and has been there about a year.  History of Present Illness: Ethan Lambert is doing very well with stabel CD4 andmeasurablebut low viral load. He continues on Atripla.He has been promoted and is happy in his situation. Routine preventivecare is updated and adherence reviewed.    Current Allergies (reviewed today): ! MORPHINE    Risk Factors:  Tobacco use:  never Passive smoke exposure:  yes Drug use:  no HIV high-risk behavior:  no Caffeine use:  3 drinks per day Alcohol use:  yes    Type:  occasionally, wine or mixed dirnk Exercise:  yes    Times per week:  5    Type:  just at work Seatbelt use:  100 %    Vital Signs:  Patient Profile:   50 Years Old Male Height:     73 inches (185.42 cm) Weight:      230.2 pounds (104.64 kg) BMI:     30.48 Temp:     98.2 degrees F (36.78 degrees C) oral Pulse rate:   70 / minute BP sitting:   136 / 87  Pt. in pain?   no              Is Patient Diabetic? No Nutritional Status BMI of 25 - 29 = overweight Nutritional Status Detail appetite "too stong"  Have you ever been in a relationship where you felt threatened, hurt or afraid?No   Does patient need assistance? Functional Status Self care Ambulation Normal     Physical Exam  General:     alert, well-developed, and well-nourished.   Mouth:     good dentition, no dental plaque, and pharynx pink and moist.   Chest Wall:     no masses.   Heart:     no rub.   Neurologic:     very mildlft leg weakness from previous stroke    Impression & Recommendations:  Problem # 1:  HIV DISEASE (ICD-042)  His updated medication list for this problem includes:    Atripla 600-200-300 Mg Tabs (Efavirenz-emtricitab-tenofovir) .[name]... Take 1 tablet by mouth at bedtime  Orders: Est. Patient Level III (16109)  Future Orders: T-CBC w/Diff ([id number]) ... 03/24/2008 T-CD4  ([id number]) ... 03/24/2008 T-Comprehensive Metabolic Panel [phone]) ... 03/24/2008 T-HIV Viral Load [phone]) ... 03/24/2008   Other Orders: Pneumococcal Vaccine (24401) Admin 1st Vaccine (02725)   Patient Instructions: 1)  Please schedule a follow-up appointment in  4-5 months.  Bring in pt. in for labs 2 weeks prior to appt.     ]  Pneumovax Vaccine    Vaccine Type: Pneumovax    Site: right deltoid    Mfr: Merck    Dose: 0.5 ml    Route: IM    Given by: Jennet Maduro RN    Exp. Date: 11/06/2008    Lot #: [id number]    VIS given: 01/20/96 version given December 10, 2007.

## 2010-07-26 NOTE — Assessment & Plan Note (Signed)
Summary: f28m   Primary Provider:  Lina Sayre MD  CC:  f/u 4 mths.  History of Present Illness: Ethan Lambert is a 50 y/o male with h/o HTN, PAF and HIV. 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  Initially presented with AF in June 2010. Admitted to [facility] and converted quickly on IV Dilt. Echo with nl EF with negative bubble study. Had recurrent AF in November 2010. Given flecainide 300 mg and converted to sinus rhythm. Dr. Graciela Husbands recommended he take this on a p.r.n. basis.  The patient has not had any further atrial fibrillation since November  He has done well and has not used the p.r.n.flecainide. Say he is feeling good. No bleeding except for a little blood in his semen and found to have hyrdocele. Bleeding has resolved. No watching BP closely but has cut significantly on salt and caffeine. No CP or SOB. Has never had stress test or cath. Does have h/o heavy snoring but had sleep study which was normal.  Current Medications (verified): 1)  Atripla 600-200-300 Mg Tabs (Efavirenz-Emtricitab-Tenofovir) .... Take 1 Tablet By Mouth At Bedtime  (Pt's Id # [id number]) 2)  Cardizem Cd 120 Mg Xr24h-Cap (Diltiazem Hcl Coated Beads) .... Take One By Mouth Once Daily 3)  Coumadin 5 Mg Tabs (Warfarin Sodium) .... Take As Directed By Coumadin Clinic. 1 and 1/2 Daily Except One Tuesday When He Takes One Tablet. 4)  Tambocor 150 Mg Tabs (Flecainide Acetate) .... Take Two As Needed  Allergies (verified): 1)  ! Morphine 2)  ! * Plastic Tape  Past History:  Past Medical History: ATRIAL FIBRILLATION WITH RAPID VENTRICULAR RESPONSE (ICD-427.31)      a.  11/2008: Echo -  EF 55-65%.  Negative bubble study. HYPERTENSION (ICD-401.9) CVA (ICD-434.91)  -1999 ENCOUNTER FOR LONG-TERM USE OF OTHER MEDICATIONS (ICD-V58.69) TENDINITIS, ELBOW (ICD-727.09) HIV DISEASE (ICD-042) Skin Cancer- non-melanomatous Jul 21, 2009.     Snoring    --negative sleep study  Review of Systems       As per HPI and past medical history; otherwise all systems negative.   Vital Signs:  Patient profile:   50 year old male Height:      73 inches Weight:      248 pounds BMI:     32.84 Pulse rate:   73 / minute BP sitting:   122 / 82  (right arm) Cuff size:   regular  Vitals Entered By: Hardin Negus, RMA (July 27, 2009 4:29 PM)  Physical Exam  General:  Gen: well appearing. no resp difficulty HEENT: normal Neck: supple. no JVD. Carotids 2+ bilat; no bruits. Cor: PMI nondisplaced. Regular rate & rhythm. No rubs, gallops, murmur. Lungs: clear Abdomen: obese soft, nontender, nondistended. No hepatosplenomegaly.  Extremities: no cyanosis, clubbing, rash, edema Neuro: alert & orientedx3, cranial nerves grossly intact. moves all 4 extremities w/o difficulty. affect pleasant     Impression & Recommendations:  Problem # 1:  ATRIAL FIBRILLATION, PAROXYSMAL (ICD-427.31) Quiescent. We reinforced the pill-in-pocket approach with flecainide. We will proceed with stress test to r/o underlying CAD. If AF happens more than 2x/year consider putting him on flecainide two times a day. Continue coumadin.  Problem # 2:  HYPERTENSION (ICD-401.9) BP has been a bit borderline. Will have him check at work 2x/week with work Engineer, civil (consulting) and fax them to [place] to review.   Other Orders: EKG w/ Interpretation (  93000) Treadmill (Treadmill)  Patient Instructions: 1)  Your physician has requested that you have an exercise tolerance test.  For further information please visit [email or web address]  Please also follow instruction sheet, as given. 2)  Follow up in 6 months

## 2010-07-26 NOTE — Progress Notes (Signed)
Summary: refill cardizem  Phone Note Refill Request Message from:  Patient on July 09, 2010 10:33 AM  Refills Requested: Medication #1:  CARDIZEM CD 240 MG XR24H-CAP 1 cap every day. [facility] [phone]  Initial call taken by: Judie Grieve,  July 09, 2010 10:33 AM    Prescriptions: CARDIZEM CD 240 MG XR24H-CAP (DILTIAZEM HCL COATED BEADS) 1 cap every day  #90 x 3   Entered by:   Hardin Negus, RMA   Authorized by:   Dolores Patty, MD, [facility]   Signed by:   Hardin Negus, RMA on 07/09/2010   Method used:   Faxed to ...       [facility] (mail-order)       [address]       [place], [place]  [address]       Ph: [phone]       Fax: [phone]   RxID:   [id number]

## 2010-07-26 NOTE — Progress Notes (Signed)
Summary: pt rtn your call/rtn your call  Phone Note Call from Patient Call back at Work Phone [phone]   Caller: Patient Reason for Call: Talk to Nurse Summary of Call: pt rtn your call Initial call taken by: Omer Jack,  March 21, 2010 8:13 AM  Follow-up for Phone Call        Left message to call back Meredith Staggers, RN  March 21, 2010 4:33 PM   Left message to call back Meredith Staggers, RN  March 22, 2010 1:28 PM   Additional Follow-up for Phone Call Additional follow up Details #1::        pt rtn call to hs Omer Jack  March 22, 2010 2:44 PM   spoke w/pt, he had dropped off his cholesterol results for Dr Gala Romney to look at and Dr Gala Romney recommended he start Fish Oil 2gms daily pt is aware and agreeable Meredith Staggers, RN  March 23, 2010 9:17 AM       -  Date:  02/16/2010    Cholesterol: 170    LDL: 76    HDL: 20    Triglycerides: 147    BG Random: 91

## 2010-07-26 NOTE — Miscellaneous (Signed)
Summary: HIPAA Restrictions  HIPAA Restrictions   Imported By: Florinda Marker 04/07/2008 14:23:36  _____________________________________________________________________  External Attachment:    Type:   Image     Comment:   External Document
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
## 2010-07-26 NOTE — Progress Notes (Signed)
Summary: Dxed w/ H1N1, started on Tamiflu and Z-Pack.  Phone Note Call from Patient Call back at Home Phone [phone]   Caller: Patient Reason for Call: Talk to Nurse Action Taken: Phone Call Completed Summary of Call: Seen by MD @ [facility] on [address] and was diagnosed with H1N1 type flu.  Given rxes for Tamiflu and Z-Pack.  Wondering if these are OK to take with his Atripla.  RN returned call to pt.  RN advised pt. that it was OK to take these medications together.  Advised pt. to take either Tylenol or ibuprofen for fever/aches/pain.  Advised pt. to drink fluids. Pt. verbalized understanding of these recommendations. Jennet Maduro RN  July 28, 2008 4:34 PM

## 2010-07-26 NOTE — Progress Notes (Signed)
Summary: Patients At Home Vitals  Patients At Home Vitals   Imported By: Roderic Ovens 09/28/2009 11:59:50  _____________________________________________________________________  External Attachment:    Type:   Image     Comment:   External Document

## 2010-07-26 NOTE — Medication Information (Signed)
Summary: rov   Anticoagulant Therapy  Managed by: Weston Brass PharmD Referring MD: Gala Romney Supervising MD: Tenny Craw MD, Gunnar Fusi Indication 1: Atrial Fibrillation PT 14.8 INR POC 1.5  Dietary changes: no    Health status changes: no    Bleeding/hemorrhagic complications: no    Recent/future hospitalizations: no    Any changes in medication regimen? no    Recent/future dental: no  Any missed doses?: no       Is patient compliant with meds? yes       Allergies: 1)  ! Morphine  Anticoagulation Management History:      The patient is on coumadin and comes in today for a routine follow up visit.  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  Negative risk factors for bleeding include an age less than 99 years old.  The bleeding index is 'intermediate risk'.  Positive CHADS2 values include History of HTN and Prior Stroke/CVA/TIA.  Negative CHADS2 values include Age > 68 years old.    Anticoagulation Management Assessment/Plan:      The patient's current anticoagulation dose is Coumadin 5 mg tabs: take as directed by coumadin clinic.  The target INR is 2.0-3.0.  Anticoagulation instructions were given to patient.  Results were reviewed/authorized by Weston Brass PharmD.  He was notified by [facility] Ao candidate.         Prior Anticoagulation Instructions: INR = 1.4     (Goal 2. 0- 3.0)  The patient is to continue with the same dose of coumadin.  This dosage includes:   5mg  daily  Come back on Friday 12/09/08/  Current Anticoagulation Instructions: INR 1.5 today    The patient's dosage of coumadin will be increased.  The new dosage includes: 2.5 mg extra today, to total 7.5 mg today, then 5 mg daily except 7.5 mg Mondays and Fridays.

## 2010-07-26 NOTE — Assessment & Plan Note (Signed)
Summary: 3 month rov/sl   Visit Type:  3 mo f/u Primary Provider:  Lina Sayre MD  CC:  no cardiac complaints today.  History of Present Illness: 50 y/o male with HIV admitted to [facility] in June with rapid AF.  He eventually converted on IV Dilt and was switched to p.o. 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  Echo in the hospital showed nl EF with negative bubble study.  Returns for routine f/u. Has not any recurrent palpitations. No dyspnea or CP. Not exercising as much. Compliant with meds. INR has been therapeutic. No bleeding with coumadin. Has given up all caffeine. Frustrated with weight gain and planning to go back to [employer]. Had sleep study with just minimal hyopneas.   Current Medications (verified): 1)  Atripla 600-200-300 Mg Tabs (Efavirenz-Emtricitab-Tenofovir) .... Take 1 Tablet By Mouth At Bedtime  (Pt's Id # [id number]) 2)  Cardizem Cd 120 Mg Xr24h-Cap (Diltiazem Hcl Coated Beads) .... Take One By Mouth Once Daily 3)  Coumadin 5 Mg Tabs (Warfarin Sodium) .... Take As Directed By Coumadin Clinic.  Allergies: 1)  ! Morphine  Past History:  Past Medical History: ATRIAL FIBRILLATION WITH RAPID VENTRICULAR RESPONSE (ICD-427.31)      a.  11/2008: Echo -  EF 55-65%.  Negative bubble study. HYPERTENSION (ICD-401.9) CVA (ICD-434.91)  -1999 ENCOUNTER FOR LONG-TERM USE OF OTHER MEDICATIONS (ICD-V58.69) TENDINITIS, ELBOW (ICD-727.09) HIV DISEASE (ICD-042)  Review of Systems       As per HPI and past medical history; otherwise all systems negative.   Vital Signs:  Patient profile:   50 year old male Height:      73 inches Weight:      243 pounds BMI:     32.18 Pulse rate:   65 / minute Pulse rhythm:   regular BP sitting:   126 / 80  (left arm) Cuff size:   large  Vitals Entered By: Danielle Rankin, CMA (March 23, 2009 3:02 PM)  Physical Exam  General:  Gen:  well appearing. no resp difficulty HEENT: normal Neck: supple. no JVD. Carotids 2+ bilat; no bruits. No lymphadenopathy or thryomegaly appreciated. Cor: PMI nondisplaced. Regular rate & rhythm. No rubs, gallops, murmur. Lungs: clear Abdomen: soft, nontender, nondistended.  Extremities: no cyanosis, clubbing, rash, edema Neuro: alert & orientedx3, cranial nerves grossly intact. moves all 4 extremities w/o difficulty. affect pleasant    Impression & Recommendations:  Problem # 1:  ATRIAL FIBRILLATION WITH RAPID VENTRICULAR RESPONSE (ICD-427.31) Now maintaining sinus rhythm, He is interested in coming off of coumadin but I explained that given CHADS score of 3 he is at increased risk of recurrent CVA and I would favor lifelong anticoagulation. He agrees to continue.

## 2010-07-26 NOTE — Progress Notes (Signed)
Summary: Prior authorization approved for med  Phone Note Other Incoming   Caller: [facility] Summary of Call: Received a fax from patient's insurance that states his Atripla has been approved beginning 08/01/09 to 08/31/09.  Pharmacy was already notified from [employer]. Copy will be scanned into patient's chart for future reference. Initial call taken by: Paulo Fruit  BS,CPht II,MPH,  August 03, 2009 1:46 PM

## 2010-07-26 NOTE — Medication Information (Signed)
Summary: rov/ewj  Anticoagulant Therapy  Managed by: Shelby Dubin, PharmD, BCPS, CPP Referring MD: Gala Romney Supervising MD: Daleen Squibb MD, Maisie Fus Indication 1: Atrial Fibrillation(427.31) Lab Used: [facility]  Site: [facility] INR POC 2.4 INR RANGE 2.0-3.0  Dietary changes: no    Health status changes: no    Bleeding/hemorrhagic complications: no    Recent/future hospitalizations: no    Any changes in medication regimen? no    Recent/future dental: no  Any missed doses?: no       Is patient compliant with meds? yes       Current Medications (verified): 1)  Atripla 600-200-300 Mg Tabs (Efavirenz-Emtricitab-Tenofovir) .... Take 1 Tablet By Mouth At Bedtime  (Pt's Id # [id number]) 2)  Cardizem Cd 120 Mg Xr24h-Cap (Diltiazem Hcl Coated Beads) .... Take One By Mouth Once Daily 3)  Coumadin 5 Mg Tabs (Warfarin Sodium) .... Take As Directed By Coumadin Clinic.  Allergies (verified): 1)  ! Morphine  Anticoagulation Management History:      The patient is taking warfarin and comes in today for a routine follow up visit.  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  Negative risk factors for bleeding include an age less than 40 years old.  The bleeding index is 'intermediate risk'.  Positive CHADS2 values include History of HTN and Prior Stroke/CVA/TIA.  Negative CHADS2 values include Age > 69 years old.  Anticoagulation responsible provider: Daleen Squibb MD, Maisie Fus.  INR POC: 2.4.  Cuvette Lot#: [id number].  Exp: 03/2010.    Anticoagulation Management Assessment/Plan:      The patient's current anticoagulation dose is Coumadin 5 mg tabs: Take as directed by coumadin clinic..  The target INR is 2.0-3.0.  The next INR is due 03/08/2009.  Anticoagulation instructions were given to patient.  Results were reviewed/authorized by Shelby Dubin, PharmD, BCPS, CPP.  He was notified by Windell Moulding.         Prior Anticoagulation Instructions: INR 2.0  Continue on same dosage  1 and  1/2 tablets daily except 1 tablet on Tuesdays and Thursdays.  Recheck in 3 weeks.    Current Anticoagulation Instructions: INR 2.4   Continue on the same dose: 1.5 tablet daily except 1 tablet on Tuesdays and Thursdays.   Recheck in 4 weeks Prescriptions: COUMADIN 5 MG TABS (WARFARIN SODIUM) Take as directed by coumadin clinic.  #135 x 3   Entered by:   Cloyde Reams RN   Authorized by:   Gaylord Shih, MD, [facility]   Signed by:   Cloyde Reams RN on 02/08/2009   Method used:   Faxed to ...       [facility] (mail-order)       [address]       [place], [place]  [address]       Ph: [phone]       Fax: [phone]   RxID:   [id number] CARDIZEM CD 120 MG XR24H-CAP (DILTIAZEM HCL COATED BEADS) take one by mouth once daily  #90 x 3   Entered by:   Cloyde Reams RN   Authorized by:   Gaylord Shih, MD, [facility]   Signed by:   Cloyde Reams RN on 02/08/2009   Method used:   Faxed to ...       [facility] (mail-order)       [address]       [place], [place]  [address]       Ph: [phone]  Fax: [phone]   RxID:   [id number] COUMADIN 5 MG TABS (WARFARIN SODIUM) Take as directed by coumadin clinic.  #90 x 3   Entered by:   Cloyde Reams RN   Authorized by:   Gaylord Shih, MD, [facility]   Signed by:   Cloyde Reams RN on 02/08/2009   Method used:   Faxed to ...       [facility] (mail-order)       [address]       [place], [place]  [address]       Ph: [phone]       Fax: [phone]   RxID:   [id number]

## 2010-07-26 NOTE — Letter (Signed)
Summary: Appointment - Reminder 2  [facility], Main Office  [address]   [place], [place] [address]   Phone: [phone]  Fax: [phone]     May 25, 2009 MRN: [id number]   [facility] [address] [place], [place]  [address]   Dear Ethan Lambert,  Our records indicate that it is time to schedule a follow-up appointment. Dr.Bensimhon recommended that you follow up with [place] in Feb,2011. It is very important that we reach you to schedule this appointment. We look forward to participating in your health care needs. Please contact us at the number listed above at your earliest convenience to schedule your appointment.  If you are unable to make an appointment at this time, give [place] a call so we can update our records.     Sincerely, [facility] Scheduling Team

## 2010-07-26 NOTE — Progress Notes (Signed)
Summary: refill  Phone Note Refill Request Message from:  Patient on Oct 23, 2009 10:29 AM  Refills Requested: Medication #1:  CARDIZEM CD 240 MG XR24H-CAP 1 cap every day. Send to [facility]   Initial call taken by: Judie Grieve,  Oct 23, 2009 10:30 AM  Follow-up for Phone Call        Rx faxed to pharmacy Follow-up by: Vikki Ports,  Oct 23, 2009 12:35 PM    Prescriptions: CARDIZEM CD 240 MG XR24H-CAP (DILTIAZEM HCL COATED BEADS) 1 cap every day  #90 x 3   Entered by:   Vikki Ports   Authorized by:   Dolores Patty, MD, [facility]   Signed by:   Vikki Ports on 10/23/2009   Method used:   Faxed to ...       [facility] (mail-order)       [address]       [place], [place]  [address]       Ph: [phone]       Fax: [phone]   RxID:   [id number]

## 2010-07-26 NOTE — Progress Notes (Signed)
Summary: Patient's Vitals  Patient's Vitals   Imported By: Marylou Mccoy 09/08/2009 13:32:15  _____________________________________________________________________  External Attachment:    Type:   Image     Comment:   External Document  Appended Document: Patient's Vitals increase cardizem to 240  Appended Document: Patient's Vitals spoke w/pt he just increase cardizem to 180mg  last week, will cont to check and send in more readings

## 2010-08-16 ENCOUNTER — Encounter (INDEPENDENT_AMBULATORY_CARE_PROVIDER_SITE_OTHER): Payer: BC Managed Care – PPO

## 2010-08-16 ENCOUNTER — Encounter: Payer: Self-pay | Admitting: Internal Medicine

## 2010-08-16 DIAGNOSIS — I4891 Unspecified atrial fibrillation: Secondary | ICD-10-CM

## 2010-08-16 DIAGNOSIS — Z7901 Long term (current) use of anticoagulants: Secondary | ICD-10-CM

## 2010-08-16 DIAGNOSIS — I6350 Cerebral infarction due to unspecified occlusion or stenosis of unspecified cerebral artery: Secondary | ICD-10-CM

## 2010-08-21 NOTE — Medication Information (Signed)
Summary: rov/eac  Anticoagulant Therapy  Managed by: Cloyde Reams, RN, BSN Referring MD: Gala Romney PCP: Lina Sayre MD Supervising MD: Ladona Ridgel MD, Sharlot Gowda Indication 1: Atrial Fibrillation(427.31) Lab Used: [facility] Site: [facility] INR RANGE 2.0-3.0  Dietary changes: no    Health status changes: no    Bleeding/hemorrhagic complications: no    Recent/future hospitalizations: no    Any changes in medication regimen? no    Recent/future dental: no  Any missed doses?: no       Is patient compliant with meds? yes       Allergies: 1)  ! Morphine 2)  ! * Plastic Tape  Anticoagulation Management History:      The patient is taking warfarin and comes in today for a routine follow up visit.  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  Negative risk factors for bleeding include an age less than 70 years old.  The bleeding index is 'intermediate risk'.  Positive CHADS2 values include History of HTN and Prior Stroke/CVA/TIA.  Negative CHADS2 values include Age > 77 years old.  Anticoagulation responsible provider: Ladona Ridgel MD, Sharlot Gowda.  Cuvette Lot#: [id number].  Exp: 02/20132.3.    Anticoagulation Management Assessment/Plan:      The patient's current anticoagulation dose is Coumadin 5 mg tabs: Take as directed by coumadin clinic. 1 and 1/2 daily except one Tuesday when he takes one tablet..  The target INR is 2.0-3.0.  The next INR is due 09/13/2010.  Anticoagulation instructions were given to patient.  Results were reviewed/authorized by Cloyde Reams, RN, BSN.  He was notified by Cloyde Reams RN.         Prior Anticoagulation Instructions: INR:  2 (goal 2-3)  Your INR is at goal today.  Continue taking 7.5 mg everyday EXCEPT for 5 mg on Tuesdays and Thursdays.  Return to clinic in 4 weeks for another INR check.      Current Anticoagulation Instructions: INR 2.3  Continue on same dosage 1.5 tablets daily except 1 tablet on Tuesdays and Thursdays.  Recheck  in 4 weeks.

## 2010-08-23 ENCOUNTER — Encounter: Payer: Self-pay | Admitting: Infectious Diseases

## 2010-08-23 ENCOUNTER — Other Ambulatory Visit (INDEPENDENT_AMBULATORY_CARE_PROVIDER_SITE_OTHER): Payer: BC Managed Care – PPO

## 2010-08-23 ENCOUNTER — Other Ambulatory Visit: Payer: Self-pay | Admitting: Infectious Diseases

## 2010-08-23 DIAGNOSIS — B20 Human immunodeficiency virus [HIV] disease: Secondary | ICD-10-CM

## 2010-08-23 LAB — CONVERTED CEMR LAB
ALT: 45 units/L (ref 0–53)
AST: 39 units/L — ABNORMAL HIGH (ref 0–37)
Albumin: 5 g/dL (ref 3.5–5.2)
Alkaline Phosphatase: 73 units/L (ref 39–117)
BUN: 15 mg/dL (ref 6–23)
Basophils Absolute: 0.1 10*3/uL (ref 0.0–0.1)
Basophils Relative: 1 % (ref 0–1)
CO2: 22 meq/L (ref 19–32)
Calcium: 9.2 mg/dL (ref 8.4–10.5)
Chloride: 103 meq/L (ref 96–112)
Cholesterol: 211 mg/dL — ABNORMAL HIGH (ref 0–200)
Creatinine, Ser: 0.79 mg/dL (ref 0.40–1.50)
Eosinophils Absolute: 0.2 10*3/uL (ref 0.0–0.7)
Eosinophils Relative: 3 % (ref 0–5)
Glucose, Bld: 98 mg/dL (ref 70–99)
HCT: 44.5 % (ref 39.0–52.0)
HDL: 25 mg/dL — ABNORMAL LOW (ref >39)
HIV 1 RNA Quant: 68 copies/mL — ABNORMAL HIGH (ref <20)
HIV-1 RNA Quant, Log: 1.83 — ABNORMAL HIGH (ref <1.30)
Hemoglobin: 15.1 g/dL (ref 13.0–17.0)
LDL Cholesterol: 133 mg/dL — ABNORMAL HIGH (ref 0–99)
Lymphocytes Relative: 36 % (ref 12–46)
Lymphs Abs: 2.1 10*3/uL (ref 0.7–4.0)
MCHC: 33.9 g/dL (ref 30.0–36.0)
MCV: 89 fL (ref 78.0–100.0)
Monocytes Absolute: 0.6 10*3/uL (ref 0.1–1.0)
Monocytes Relative: 10 % (ref 3–12)
Neutro Abs: 2.9 10*3/uL (ref 1.7–7.7)
Neutrophils Relative %: 50 % (ref 43–77)
Platelets: 203 10*3/uL (ref 150–400)
Potassium: 3.9 meq/L (ref 3.5–5.3)
RBC: 5 M/uL (ref 4.22–5.81)
RDW: 13.8 % (ref 11.5–15.5)
Sodium: 138 meq/L (ref 135–145)
Total Bilirubin: 0.6 mg/dL (ref 0.3–1.2)
Total CHOL/HDL Ratio: 8.4
Total Protein: 8.2 g/dL (ref 6.0–8.3)
Triglycerides: 263 mg/dL — ABNORMAL HIGH (ref <150)
VLDL: 53 mg/dL — ABNORMAL HIGH (ref 0–40)
WBC: 5.8 10*3/uL (ref 4.0–10.5)

## 2010-08-24 LAB — T-HELPER CELL (CD4) - (RCID CLINIC ONLY)
CD4 % Helper T Cell: 36 % (ref 33–55)
CD4 T Cell Abs: 770 uL (ref 400–2700)

## 2010-09-06 ENCOUNTER — Encounter: Payer: Self-pay | Admitting: Infectious Diseases

## 2010-09-06 ENCOUNTER — Ambulatory Visit (INDEPENDENT_AMBULATORY_CARE_PROVIDER_SITE_OTHER): Payer: BC Managed Care – PPO | Admitting: Infectious Diseases

## 2010-09-06 DIAGNOSIS — B20 Human immunodeficiency virus [HIV] disease: Secondary | ICD-10-CM

## 2010-09-06 DIAGNOSIS — I10 Essential (primary) hypertension: Secondary | ICD-10-CM

## 2010-09-09 LAB — T-HELPER CELL (CD4) - (RCID CLINIC ONLY)
CD4 % Helper T Cell: 39 % (ref 33–55)
CD4 T Cell Abs: 700 uL (ref 400–2700)

## 2010-09-10 LAB — T-HELPER CELL (CD4) - (RCID CLINIC ONLY)
CD4 % Helper T Cell: 30 % — ABNORMAL LOW (ref 33–55)
CD4 T Cell Abs: 720 uL (ref 400–2700)

## 2010-09-11 NOTE — Assessment & Plan Note (Signed)
Summary: F/U OV/VS   Vital Signs:  Patient profile:   50 year old male Height:      73 inches (185.42 cm) Weight:      244.12 pounds (110.96 kg) BMI:     32.32 Temp:     98.2 degrees F (36.78 degrees C) oral Pulse rate:   77 / minute BP sitting:   144 / 89  (left arm)  Vitals Entered By: Wendall Mola CMA Duncan Dull) (September 06, 2010 11:42 AM) CC: follow-up visit, lab results, nose bleed past two days Is Patient Diabetic? No Pain Assessment Patient in pain? no      Nutritional Status BMI of > 30 = obese Nutritional Status Detail appetite "good"  Have you ever been in a relationship where you felt threatened, hurt or afraid?No   Does patient need assistance? Functional Status Self care Ambulation Normal Comments no missed doses of meds per pt.   Primary Provider:  Lina Sayre MD  CC:  follow-up visit, lab results, and nose bleed past two days.  History of Present Illness: Ethan Lambert is doing well and working. HIs problems are under control and he only upset at his partner who has started smoking again. Ethan Lambert adheresto his ARVs.  Preventive Screening-Counseling & Management  Alcohol-Tobacco     Alcohol drinks/day: 0     Alcohol type: beer     Smoking Status: never     Passive Smoke Exposure: yes  Caffeine-Diet-Exercise     Caffeine use/day: 0     Does Patient Exercise: no  Hep-HIV-STD-Contraception     HIV Risk: no risk noted  Safety-Violence-Falls     Seat Belt Use: yes      Sexual History:  currently monogamous.        Drug Use:  never.    Comments: pt. declined condoms  Allergies: 1)  ! Morphine 2)  ! * Plastic Tape  Physical Exam  General:  Well-developed,well-nourished,in no acute distress; alert,appropriate and cooperative throughout examination Eyes:  No corneal or conjunctival inflammation noted. EOMI. Perrla. Funduscopic exam benign, without hemorrhages, exudates or papilledema. Vision grossly normal. Mouth:  Oral mucosa and oropharynx  without lesions or exudates.  Teeth in good repair. Lungs:  Normal respiratory effort, chest expands symmetrically. Lungs are clear to auscultation, no crackles or wheezes. Heart:  Normal rate and regular rhythm. S1 and S2 normal without gallop, murmur, click, rub or other extra sounds.   Impression & Recommendations:  Problem # 1:  HIV DISEASE (ICD-042)  Stable and continue Atripla.  Orders: Est. Patient Level IV (16109)   Orders Added: 1)  Est. Patient Level IV [60454]   Immunization History:  Influenza Immunization History:    Influenza:  historical (04/04/2010)   Immunization History:  Influenza Immunization History:    Influenza:  Historical (04/04/2010)

## 2010-09-11 NOTE — Miscellaneous (Signed)
Summary: Orders Update labwork  Clinical Lists Changes  Orders: Added new Test order of T-CBC w/Diff [phone]) - Signed Added new Test order of T-CD4SP [facility]) (CD4SP) - Signed Added new Test order of T-Comprehensive Metabolic Panel [phone]) - Signed Added new Test order of T-HIV Viral Load [phone]) - Signed Added new Test order of T-RPR (Syphilis) ([id number]) - Signed     Process Orders Check Orders Results:     [facility]: ABN not required for this insurance Order queued for requisitioning for [facility]: September 06, 2010 9:22 AM  Tests Sent for requisitioning (September 06, 2010 9:22 AM):     02/22/2011: [facility] -- T-CBC w/Diff [[id number]] (signed)     02/22/2011: [facility] -- T-Comprehensive Metabolic Panel [[id number]] (signed)     02/22/2011: [facility] -- T-HIV Viral Load [phone] (signed)     02/22/2011: [facility] -- T-RPR (Syphilis) [phone] (signed)

## 2010-09-13 ENCOUNTER — Encounter: Payer: BC Managed Care – PPO | Admitting: *Deleted

## 2010-09-17 ENCOUNTER — Ambulatory Visit (INDEPENDENT_AMBULATORY_CARE_PROVIDER_SITE_OTHER): Payer: BC Managed Care – PPO | Admitting: *Deleted

## 2010-09-17 DIAGNOSIS — I4891 Unspecified atrial fibrillation: Secondary | ICD-10-CM

## 2010-09-17 DIAGNOSIS — I6350 Cerebral infarction due to unspecified occlusion or stenosis of unspecified cerebral artery: Secondary | ICD-10-CM

## 2010-09-17 LAB — POCT INR: INR: 2.5

## 2010-09-17 NOTE — Patient Instructions (Signed)
INR 2.5 Return to clinic in 4 weeks Cont taking current regimen

## 2010-09-26 LAB — DIFFERENTIAL
Basophils Absolute: 0 10*3/uL (ref 0.0–0.1)
Basophils Relative: 1 % (ref 0–1)
Eosinophils Absolute: 0.2 10*3/uL (ref 0.0–0.7)
Eosinophils Relative: 3 % (ref 0–5)
Lymphocytes Relative: 39 % (ref 12–46)
Lymphs Abs: 2.2 10*3/uL (ref 0.7–4.0)
Monocytes Absolute: 0.5 10*3/uL (ref 0.1–1.0)
Monocytes Relative: 9 % (ref 3–12)
Neutro Abs: 2.7 10*3/uL (ref 1.7–7.7)
Neutrophils Relative %: 48 % (ref 43–77)

## 2010-09-26 LAB — APTT: aPTT: 38 seconds — ABNORMAL HIGH (ref 24–37)

## 2010-09-26 LAB — BASIC METABOLIC PANEL
BUN: 13 mg/dL (ref 6–23)
CO2: 25 mEq/L (ref 19–32)
Calcium: 9 mg/dL (ref 8.4–10.5)
Chloride: 104 mEq/L (ref 96–112)
Creatinine, Ser: 0.86 mg/dL (ref 0.4–1.5)
GFR calc Af Amer: >60 mL/min (ref >60)
GFR calc non Af Amer: >60 mL/min (ref >60)
Glucose, Bld: 119 mg/dL — ABNORMAL HIGH (ref 70–99)
Potassium: 4.3 mEq/L (ref 3.5–5.1)
Sodium: 137 mEq/L (ref 135–145)

## 2010-09-26 LAB — CBC
HCT: 50.6 % (ref 39.0–52.0)
Hemoglobin: 17.6 g/dL — ABNORMAL HIGH (ref 13.0–17.0)
MCHC: 34.7 g/dL (ref 30.0–36.0)
MCV: 89.1 fL (ref 78.0–100.0)
Platelets: 208 10*3/uL (ref 150–400)
RBC: 5.68 MIL/uL (ref 4.22–5.81)
RDW: 13.8 % (ref 11.5–15.5)
WBC: 5.6 10*3/uL (ref 4.0–10.5)

## 2010-09-26 LAB — PROTIME-INR
INR: 1.94 — ABNORMAL HIGH (ref 0.00–1.49)
Prothrombin Time: 22 seconds — ABNORMAL HIGH (ref 11.6–15.2)

## 2010-09-26 LAB — POCT CARDIAC MARKERS
CKMB, poc: 3.2 ng/mL (ref 1.0–8.0)
Myoglobin, poc: 101 ng/mL (ref 12–200)
Troponin i, poc: <0.05 ng/mL (ref 0.00–0.09)

## 2010-09-26 LAB — TSH: TSH: 3.035 u[IU]/mL (ref 0.350–4.500)

## 2010-09-28 LAB — T-HELPER CELL (CD4) - (RCID CLINIC ONLY)
CD4 % Helper T Cell: 38 % (ref 33–55)
CD4 T Cell Abs: 800 uL (ref 400–2700)

## 2010-10-01 LAB — CBC
HCT: 43.7 % (ref 39.0–52.0)
Hemoglobin: 15.2 g/dL (ref 13.0–17.0)
MCHC: 34.7 g/dL (ref 30.0–36.0)
MCV: 88.4 fL (ref 78.0–100.0)
Platelets: 201 10*3/uL (ref 150–400)
RBC: 4.94 MIL/uL (ref 4.22–5.81)
RDW: 13.9 % (ref 11.5–15.5)
WBC: 6.4 10*3/uL (ref 4.0–10.5)

## 2010-10-01 LAB — URINALYSIS, ROUTINE W REFLEX MICROSCOPIC
Bilirubin Urine: NEGATIVE
Glucose, UA: NEGATIVE mg/dL
Hgb urine dipstick: NEGATIVE
Ketones, ur: NEGATIVE mg/dL
Nitrite: NEGATIVE
Protein, ur: NEGATIVE mg/dL
Specific Gravity, Urine: 1.011 (ref 1.005–1.030)
Urobilinogen, UA: 1 mg/dL (ref 0.0–1.0)
pH: 7 (ref 5.0–8.0)

## 2010-10-01 LAB — POCT I-STAT, CHEM 8
BUN: 15 mg/dL (ref 6–23)
Calcium, Ion: 1.13 mmol/L (ref 1.12–1.32)
Chloride: 106 mEq/L (ref 96–112)
Creatinine, Ser: 1.1 mg/dL (ref 0.4–1.5)
Glucose, Bld: 115 mg/dL — ABNORMAL HIGH (ref 70–99)
HCT: 46 % (ref 39.0–52.0)
Hemoglobin: 15.6 g/dL (ref 13.0–17.0)
Potassium: 3.8 mEq/L (ref 3.5–5.1)
Sodium: 142 mEq/L (ref 135–145)
TCO2: 27 mmol/L (ref 0–100)

## 2010-10-01 LAB — TSH
TSH: 1.703 u[IU]/mL (ref 0.350–4.500)
TSH: 2.901 u[IU]/mL (ref 0.350–4.500)

## 2010-10-01 LAB — HEPARIN LEVEL (UNFRACTIONATED): Heparin Unfractionated: 0.17 IU/mL — ABNORMAL LOW (ref 0.30–0.70)

## 2010-10-01 LAB — RAPID URINE DRUG SCREEN, HOSP PERFORMED
Amphetamines: NOT DETECTED
Barbiturates: NOT DETECTED
Benzodiazepines: NOT DETECTED
Cocaine: NOT DETECTED
Opiates: NOT DETECTED
Tetrahydrocannabinol: NOT DETECTED

## 2010-10-01 LAB — CK TOTAL AND CKMB (NOT AT ARMC)
CK, MB: 2.3 ng/mL (ref 0.3–4.0)
CK, MB: 2.9 ng/mL (ref 0.3–4.0)
Relative Index: 1.8 (ref 0.0–2.5)
Relative Index: 1.9 (ref 0.0–2.5)
Total CK: 122 U/L (ref 7–232)
Total CK: 164 U/L (ref 7–232)

## 2010-10-01 LAB — POCT CARDIAC MARKERS
CKMB, poc: 1 ng/mL (ref 1.0–8.0)
Myoglobin, poc: 60.6 ng/mL (ref 12–200)
Troponin i, poc: <0.05 ng/mL (ref 0.00–0.09)

## 2010-10-01 LAB — APTT: aPTT: 30 seconds (ref 24–37)

## 2010-10-01 LAB — D-DIMER, QUANTITATIVE (NOT AT ARMC): D-Dimer, Quant: 2.06 ug/mL-FEU — ABNORMAL HIGH (ref 0.00–0.48)

## 2010-10-01 LAB — TROPONIN I
Troponin I: 0.03 ng/mL (ref 0.00–0.06)
Troponin I: 0.03 ng/mL (ref 0.00–0.06)

## 2010-10-01 LAB — PROTIME-INR
INR: 1 (ref 0.00–1.49)
Prothrombin Time: 13.4 seconds (ref 11.6–15.2)

## 2010-10-04 LAB — T-HELPER CELL (CD4) - (RCID CLINIC ONLY)
CD4 % Helper T Cell: 31 % — ABNORMAL LOW (ref 33–55)
CD4 T Cell Abs: 670 uL (ref 400–2700)

## 2010-10-12 ENCOUNTER — Other Ambulatory Visit: Payer: Self-pay | Admitting: *Deleted

## 2010-10-12 DIAGNOSIS — B20 Human immunodeficiency virus [HIV] disease: Secondary | ICD-10-CM

## 2010-10-12 MED ORDER — EFAVIRENZ-EMTRICITAB-TENOFOVIR 600-200-300 MG PO TABS: 1.0000 | ORAL_TABLET | Freq: Every day | ORAL | Status: DC

## 2010-10-15 ENCOUNTER — Other Ambulatory Visit: Payer: Self-pay | Admitting: *Deleted

## 2010-10-15 ENCOUNTER — Encounter (INDEPENDENT_AMBULATORY_CARE_PROVIDER_SITE_OTHER): Payer: BC Managed Care – PPO | Admitting: *Deleted

## 2010-10-15 DIAGNOSIS — B20 Human immunodeficiency virus [HIV] disease: Secondary | ICD-10-CM

## 2010-10-15 DIAGNOSIS — I4891 Unspecified atrial fibrillation: Secondary | ICD-10-CM

## 2010-10-15 DIAGNOSIS — I6350 Cerebral infarction due to unspecified occlusion or stenosis of unspecified cerebral artery: Secondary | ICD-10-CM

## 2010-10-15 LAB — POCT INR: INR: 2.5

## 2010-10-15 MED ORDER — EFAVIRENZ-EMTRICITAB-TENOFOVIR 600-200-300 MG PO TABS: 1.0000 | ORAL_TABLET | Freq: Every day | ORAL | Status: DC

## 2010-10-15 NOTE — Progress Notes (Signed)
This encounter was created in error - please disregard.

## 2010-10-16 ENCOUNTER — Ambulatory Visit (INDEPENDENT_AMBULATORY_CARE_PROVIDER_SITE_OTHER): Payer: BC Managed Care – PPO | Admitting: *Deleted

## 2010-10-16 DIAGNOSIS — I4891 Unspecified atrial fibrillation: Secondary | ICD-10-CM

## 2010-10-16 DIAGNOSIS — I6350 Cerebral infarction due to unspecified occlusion or stenosis of unspecified cerebral artery: Secondary | ICD-10-CM

## 2010-10-16 LAB — POCT INR: INR: 2.2

## 2010-10-31 ENCOUNTER — Telehealth: Payer: Self-pay | Admitting: Internal Medicine

## 2010-10-31 MED ORDER — FLECAINIDE ACETATE 100 MG PO TABS: 100.0000 mg | ORAL_TABLET | Freq: Two times a day (BID) | ORAL | Status: DC

## 2010-10-31 NOTE — Telephone Encounter (Signed)
Discussed w/Dr Bensimhon he would like for pt to start Flecainide 100mg  bid, spoke w/pt he states he has just been having episodes more frequently when it happens it is usually during the night and it wakes him up.  He takes 2 flecainide tabs and it breaks in about 2 hours but then he just feels so wiped out.  He is agreeable to start flecainide on a regular basis, [facility] he will call me when he starts med to sch [facility]

## 2010-10-31 NOTE — Telephone Encounter (Signed)
Pt calling re having going into a-fib getting closer together , went from 6 mos apart, 3 mos apart, now 3 weeks apart, only has then at night from 12 to 1am-pls advise

## 2010-11-06 NOTE — Discharge Summary (Signed)
Ethan Lambert, Ethan Lambert              ACCOUNT NO.:  [id number]   MEDICAL RECORD NO.:  [id number]          PATIENT TYPE:  INP   LOCATION:  2904                         FACILITY:  [facility]   PHYSICIAN:  Bevelyn Buckles. Bensimhon, MDDATE OF BIRTH:  April 24, 1961   DATE OF ADMISSION:  12/01/2008  DATE OF DISCHARGE:  12/01/2008                               DISCHARGE SUMMARY   PROCEDURE:  A 2-D echocardiogram.   PRIMARY FINAL DISCHARGE DIAGNOSIS:  Atrial fibrillation with rapid  ventricular response.   SECONDARY DIAGNOSES:  1. Hypertension.  2. Human immunodeficiency virus positive.  3. Reported history of hole in heart, diagnosed by transesophageal      echocardiography.  4. 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  5. History of nephrolithiasis.  6. Status post laparoscopic cholecystectomy and appendectomy.  7. Allergy or intolerance to MORPHINE.   TIME AT DISCHARGE:  34 minutes.   HOSPITAL COURSE:  Ethan Lambert is a 50 year old male with no previous  history of coronary artery disease.  By his description, he had a TEE at  the time of his nonhemorrhagic CVA and was told he had a hole in his  heart.  He has had no other cardiac issues.  At 12:15 a.m. today, he  woke up coughing.  At that time, he felt palpitations and when they did  not resolve, he came to the emergency room where he was in atrial  fibrillation with rapid ventricular response.  He was admitted for  further evaluation.   All of his labs were within normal limits except for a blood sugar of  115 and a D-dimer of 2.06.  He is generally active and working up  several times a week and is considered at low risk for PE at this time  with no signs or symptoms of DVT and normal O2 saturations on room air,  so no further workup is performed at this time.  He was put on IV  Cardizem for rate control.  This was up titrated to 20 mg an hour.  The  plan was to cardiovert him in the morning if he did not spontaneously  convert; however, at approximately 1300, he converted to sinus rhythm.  The Cardizem was titrated down to 5 mg per hour, and his heart rate was  in the 60s with a systolic blood pressure in the 120s to 130s.  Dr.  Gala Romney reviewed the data and felt that Ethan Lambert was low risk and  could be loaded with Coumadin as an outpatient, which he needs because  his CHADS score is 3.  He did not feel that will overlap with heparin or  Lovenox, was indicated.  Ethan Lambert ambulated without difficulty and  maintained sinus rhythm.  The Cardizem was changed to p.o.  On the  afternoon of December 01, 2008, he was considered stable for discharge with  close outpatient followup.   DISCHARGE INSTRUCTIONS:  1. His activity level is to be increased gradually.  2. He is to call our office for any more palpitations or symptoms.  3. He is to follow up with the PA for  Dr. Gala Romney on December 12, 2008,      at 11:45.  He is to follow up with the [facility] on December 05, 2008, at 1:30.  He is to follow up with Dr. Maurice March as needed.   DISCHARGE MEDICATIONS:  1. Coumadin 5 mg daily or as directed (7.5 mg given today).  2. Cardizem CD 120 mg daily.  3. Atripla 1 tablet daily.      Theodore Demark, PA-C      Bevelyn Buckles. Bensimhon, MD  Electronically Signed    RB/MEDQ  D:  12/01/2008  T:  12/02/2008  Job:  [id number]   cc:   Fransisco Hertz, M.D.

## 2010-11-06 NOTE — Procedures (Signed)
Ethan Lambert, Ethan Lambert              ACCOUNT NO.:  [id number]   MEDICAL RECORD NO.:  [id number]          PATIENT TYPE:  OUT   LOCATION:  SLEEP CENTER                 FACILITY:  [facility]   PHYSICIAN:  Barbaraann Share, MD,FCCPDATE OF BIRTH:  1961-04-17   DATE OF STUDY:  01/08/2009                            NOCTURNAL POLYSOMNOGRAM   REFERRING PHYSICIAN:  Bevelyn Buckles. Bensimhon, MD   INDICATION FOR STUDY:  Hypersomnia with sleep apnea.   EPWORTH SLEEPINESS SCORE:  Seven.   MEDICATIONS:   SLEEP ARCHITECTURE:  The patient had total sleep time of 367 minutes  with no slow wave sleep and only 60 minutes of REM.  Sleep onset latency  was normal at 15 minutes, and REM onset was normal at 73 minutes.  Sleep  efficiency was moderately decreased at 84%.   RESPIRATORY DATA:  The patient was found to have only one obstructive  apnea and 5 obstructive hypopneas giving him an AHI of only 1 event per  hour.  Events occurred primarily in the supine position, and there was  moderate snoring noted throughout.  The patient did not meet split-night  criteria secondary to his very small numbers of events.   OXYGEN DATA:  There was O2 desaturation transiently as low as 83% with  his obstructive events.  However, he only spent 36 seconds the entire  night less than 88%.   CARDIAC DATA:  Rare PVC noted, but no clinically significant arrhythmias  were seen.   MOVEMENT-PARASOMNIA:  The patient had no significant leg jerks or other  abnormal behaviors noted.   IMPRESSIONS-RECOMMENDATIONS:  1. Small numbers of obstructive events which do not meet the apnea-      hypopnea index criteria for the obstructive sleep apnea syndrome.      The patient did not meet split-night criteria due to his small      numbers of events.  2. Transient O2 desaturation as low as 83%, however, the patient only      spent 36 seconds the entire night less than      88%.  This is not clinically significant.  3. Rare premature  ventricular contraction noted but no clinically      significant arrhythmia seen.      Barbaraann Share, MD,FCCP  Diplomate, American Board of Sleep  Medicine  Electronically Signed     [facility]/MEDQ  D:  01/15/2009 17:37:48  T:  01/16/2009 03:32:21  Job:  [id number]

## 2010-11-06 NOTE — H&P (Signed)
NAMEGREGROY, Ethan Lambert              ACCOUNT NO.:  [id number]   MEDICAL RECORD NO.:  [id number]          PATIENT TYPE:  INP   LOCATION:  2904                         FACILITY:  [facility]   PHYSICIAN:  Bevelyn Buckles. Bensimhon, MDDATE OF BIRTH:  11-25-1960   DATE OF ADMISSION:  12/01/2008  DATE OF DISCHARGE:                              HISTORY & PHYSICAL   PRIMARY CARE PHYSICIAN:  Ethan Lambert, M.D.   PRIMARY CARDIOLOGIST:  Bevelyn Buckles. Bensimhon, MD   CHIEF COMPLAINT:  Palpitations.   HISTORY OF PRESENT ILLNESS:  Ethan Lambert is a 50 year old male with no  history of coronary artery disease.  He woke up at approximately 12:15  a.m. today coughing.  He felt palpitations at that time.  They were  associated with diaphoresis and shortness of breath but no chest pain or  nausea.  His symptoms continued, so he came to the emergency room.  He  was placed on IV Cardizem, and he still feels the palpitations but they  have improved.  He is very clear in his history that they were not there  yesterday.  He has had some brief palpitations but none in the last  several months.  When he was camping approximately 1 year ago, he had a  similar episode that lasted about an hour.  Because of that episode, his  family physician advised him to limit caffeine, which he has done.  He  generally has a cup of coffee in the morning and at lunch, but yesterday  he had tea at dinner, which was usual for him and chocolate chip cookies  later.  Currently, he is on IV Cardizem and his rate at rest is in the  90s, his palpitations have improved, but he is still aware of them.   PAST MEDICAL HISTORY:  1. Hypertension.  2. HIV  3. Hole in heart, diagnosed after a CVA.  4. History of non-hemorrhagic CVA in 1999.  5. History of nephrolithiasis.   SURGICAL HISTORY:  He is status post laparoscopic cholecystectomy,  appendectomy, and by description a TEE.   ALLERGIES:  MORPHINE.   CURRENT MEDICATIONS:  Atripla 1 tablet  daily.   SOCIAL HISTORY:  Lives in [place] with his partner and works as a  Pharmacologist.  He has no history of alcohol, tobacco, or  drug abuse.  He has recently joint a gym and exercises 3 times a week.  He feels he eats a reasonably heart healthy diet but admits to  indiscretions with sweets.   FAMILY HISTORY:  Both of his parents are alive, his mother is 60 and his  dad is 53.  His dad has an arrhythmia, which is possibly atrial  fibrillation, and he has 1 brother who is alive in his late 59s with an  MI.   REVIEW OF SYSTEMS:  From the CVA, he has some slight left-sided weakness  and upper right quadrant vision loss.  Before the current illness, he  had no chest pain, shortness of breath, dyspnea on exertion, orthopnea,  PND, or edema.  He was coughing last night which he does  sometimes  because of seasonal allergies but generally does not cough or wheeze.  He has occasional diarrhea and reflux symptoms, which he treats with  over-the-counter medications.  Full 14-point review of systems is  otherwise negative.   PHYSICAL EXAMINATION:  VITAL SIGNS:  Temperature is 98.0, blood pressure  140/72, pulse 136, respiratory rate 16, O2 saturation 99% on 2 L.  GENERAL:  He is a well-developed, well-nourished white male, in no acute  distress.  HEENT:  Normal.  NECK:  There is no lymphadenopathy, thyromegaly, bruit, or JVD noted.  CV:  His heart is rapid and irregular in rate and rhythm with an S1 and  S2, and no significant murmur, rub, or gallop is noted.  Distal pulses  are intact in all 4 extremities and no femoral bruits are appreciated.  LUNGS:  Clear to auscultation bilaterally.  SKIN:  No rashes or lesions are noted.  Abdomen:  Soft, nontender with active bowel sounds and no  hepatosplenomegaly.  EXTREMITIES:  There is no cyanosis, clubbing, or edema noted.  MUSCULOSKELETAL:  There is no joint deformity or effusions and no spine  or CVA tenderness.  NEURO:   He is alert and oriented.  Cranial nerves II through XII grossly  intact.   Chest x-ray, subsegmental atelectasis of the left base.   EKG is AFib rate 110 with no acute ischemic changes.   LABORATORY VALUES:  Point of care markers negative x1.  Urine drug  screen negative.  CK-MB and troponin I negative x1.  D-dimer 2.06,  hemoglobin 15.2, hematocrit 43.7, WBC 6.4, platelets 201.  Sodium 142,  potassium 3.8, chloride 106, BUN 15, creatinine 1.1, glucose 115, INR  1.0.   IMPRESSION:  Ethan Lambert was seen today by Dr. Gala Lambert.  He is certain  that his paroxysmal atrial fibrillation started this a.m.  He will be  observed on telemetry and hopefully will convert spontaneously.  If not,  we will make him n.p.o. after midnight and plan a direct current  cardioversion in the a.m.  He will be continued on the Cardizem.  He is  on heparin, which was started in the emergency room and because his [place]  score is 3, he is a Coumadin candidate.  He most likely has obstructive  sleep apnea, which can be evaluated as an outpatient.  Further  evaluation and treatment will depend on the results of the above  testing.  A TSH is pending.      Ethan Demark, PA-C      Bevelyn Buckles. Bensimhon, MD  Electronically Signed    RB/MEDQ  D:  12/01/2008  T:  12/01/2008  Job:  [id number]

## 2010-11-09 NOTE — Op Note (Signed)
NAME:  Ethan Lambert, Ethan Lambert NO.:  [id number]   MEDICAL RECORD NO.:  [id number]                   PATIENT TYPE:  OBV   LOCATION:  5729                                 FACILITY:  [facility]   PHYSICIAN:  Angelia Mould. Derrell Lolling, M.D.             DATE OF BIRTH:  02-05-1961   DATE OF PROCEDURE:  06/09/2003  DATE OF DISCHARGE:                                 OPERATIVE REPORT   PREOPERATIVE DIAGNOSIS:  Acute appendicitis.   POSTOPERATIVE DIAGNOSIS:  Acute appendicitis.   OPERATION PERFORMED:  Laparoscopic appendectomy.   SURGEON:  Angelia Mould. Derrell Lolling, M.D.   OPERATIVE INDICATIONS:  This is a 50 year old white man who is HIV positive  on antiviral therapy.  He presents to the emergency room  with a several  hour history of abdominal pain localized to the right lower quadrant pain.  He vomited once in the ER after he had a CT scan.  He was evaluated by the  emergency department.  A CT scan was obtained which showed a large, inflamed  appendix.  I was called at that time.  On exam, he has localized tenderness,  guarding, and rebound of the right lower quadrant and a white blood cell  count was found to be 12,000.  It is felt that he most likely has  appendicitis and is brought to the operating room emergently.   OPERATIVE FINDINGS:  The patient had acute appendicitis.  The appendix was  thickened, indurated, inflamed, enlarged, and had exudate on it.  There was  no evidence of perforation or abscess.  The terminal ileum and right colon  looked normal.  The rectum was normal.  The omentum and liver looked OK.   SURGICAL TECHNIQUE:  Following the induction of general endotracheal  anesthesia, the patient's abdomen was prepped and draped in a sterile  fashion.  0.5% Marcaine with epinephrine was used as a local infiltration  anesthetic.  A transverse incision was made at the superior rim of the  umbilicus.  The fascia was incised vertically and found a small 5 mm  umbilical  hernia defect.  This was incorporated into the wound closure.  A  10 mm Hasson trocar was inserted and secured with a purse-string suture of 0  Vicryl.  Pneumoperitoneum was created.  The video camera was inserted with  visualization of the findings as described above.  The patient was  positioned in Trendelenburg and rolled to the left.  A 5 mm trocar was  placed in the right upper quadrant and a 12 mm trocar placed in the  suprapubic region.   We encircled the tip of the appendix with an endoloop tie.  We had to use  the Harmonic scalpel to divide the lateral peritoneal attachments of the  terminal ileum and the cecum to mobilize this up until we could visualize  the appendix better.  We then used the Harmonic scalpel to divide the  appendiceal  mesentery and appendiceal artery.  Once this was done, we had  the appendix completely mobilized up and we could clearly see the insertion  of the appendix into the cecum.  An endo-GIA stapling device with 2.5 mm  staple height was inserted, placed across the base of the appendix, closed,  held in place for 30 seconds, fired, and removed.  This provided a very nice  closure of the cecum and a nice division of the appendix.  The appendix was  placed in a specimen bag and removed.   The operative field was copiously irrigated.  There had been a little bit of  bleeding during the case but that was all cleared up and taken care of once  we took the appendix out.  We irrigated the pelvis and right colic gutter  and everything looked fine.  We checked the staple line and it looked  perfectly secure.  The trocars were removed under direct vision.  There was  no bleeding from the trocar sites.  The pneumoperitoneum was released.  The  fascia at the umbilicus and the fascia at the suprapubic site were closed  with 0 Vicryl sutures.  The wound was irrigated with saline and the skin  closed with subcuticular sutures of 4-0Vicryl and Steri-Strips.  Clean   bandages were placed.  The patient was taken to the recovery room in stable  condition.  Estimated blood loss was about 15-20 mL.  Complications were  none.  Sponge and instrument counts were correct.                                               Angelia Mould. Derrell Lolling, M.D.    HMI/MEDQ  D:  06/09/2003  T:  06/09/2003  Job:  [id number]   cc:   Fransisco Hertz, M.D.  [address]  [place] [address]  Fax: [phone]

## 2010-11-09 NOTE — Op Note (Signed)
[facility]  Patient:    JEANNE, TERRANCE Visit Number: [id number] MRN: [id number]          Service Type: DSU Location: DAY Attending Physician:  Carson Myrtle Dictated by:   Sheppard Plumber Earlene Plater, M.D. Proc. Date: 05/18/01 Admit Date:  05/18/2001   CC:         Fransisco Hertz, M.D.   Operative Report  PREOPERATIVE DIAGNOSIS:  Anal condylomata.  POSTOPERATIVE DIAGNOSIS:  Anal condylomata.  PROCEDURE:  Laser destruction of anal condylomata.  SURGEON:  Timothy E. Earlene Plater, M.D.  ANESTHESIA:  General.  INDICATION:  Mr. 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  He has developed over the past few months increasing anal condylomata and because of the presence, the increasing numbers, and the discomfort, he wishes to proceed with laser destruction.  He understands full well the viral nature, the contagiousness, the high recurrence rate.  DESCRIPTION OF PROCEDURE:  The patient brought to the operating room, placed supine, LMA anesthesia provided.  He was placed in lithotomy position.  The perineum was carefully inspected under magnification, draped off as a field for laser.  Using the CO2 laser set at 5 watts, the external condylomata were carefully and individually destroyed.  The anodermal warts were treated next and then with the anoscope in place, the anal mucosa and rectal condylomata were treated and destroyed.  Again, under magnification, no warts remained. This completed the procedure, anesthetic ointment applied and a dressing.  He tolerated it well.  He was given written and verbal instructions including Percocet and will be followed as an outpatient. Dictated by:   Sheppard Plumber Earlene Plater, M.D. Attending Physician:  Carson Myrtle DD:  05/18/01 TD:  05/18/01 Job: [id number]

## 2010-11-09 NOTE — H&P (Signed)
NAME:  Ethan Lambert, Ethan Lambert NO.:  [id number]   MEDICAL RECORD NO.:  [id number]                   PATIENT TYPE:  OBV   LOCATION:  1825                                 FACILITY:  [facility]   PHYSICIAN:  Angelia Mould. Derrell Lolling, M.D.             DATE OF BIRTH:  01/17/1961   DATE OF ADMISSION:  06/08/2003  DATE OF DISCHARGE:                                HISTORY & PHYSICAL   CHIEF COMPLAINT:  Abdominal pain.   HISTORY OF PRESENT ILLNESS:  This is a 50 year old HIV positive gentleman  who noted the onset of epigastric pain at 6 a.m. on June 08, 2003.  The  pain has been burning in character, steady, and has now become more  localized in the right lower quadrant.  He has chronic diarrhea, has not  seen any blood in his stools.  He denies nausea or vomiting.  He has had low-  grade fever.  No prior episodes.  He came to the emergency room at 7 p.m.  and I was called to see him at 3 a.m.  A CT scan apparently shows  appendicitis.   PAST MEDICAL HISTORY:  1. The patient is HIV positive and is followed by Dr. Lina Sayre.  He     states that he has had a hole in his heart.  2. A remote history of kidney stones.  3. He was hospitalized in 1999, had hilar adenopathy and was found to have a     left frontal and occipital cerebrovascular accident, non hemorrhagic.   CURRENT MEDICATIONS:  Combivir and Viramune.   DRUG ALLERGIES:  MORPHINE.   SOCIAL HISTORY:  He lives in [place], is single, is here with his  partner.  Denies the use of alcohol or tobacco.  Works as a Publishing rights manager.   FAMILY HISTORY:  Mother is living, has hypertension and hyperlipidemia.  Father is living and well.   REVIEW OF SYSTEMS:  All systems reviewed.  The are noncontributory except as  described above.   PHYSICAL EXAMINATION:  GENERAL:  A pleasant middle-aged gentleman,  overweight, in moderate distress from abdominal pain.  VITAL SIGNS:  Temperature 100.8, heart rate 94,  blood pressure 186/88,  respirations 22.  HEENT:  Eyes - sclerae clear.  Extraocular movements are intact. Nose, lips,  tongue and oropharynx without gross lesions.  NECK:  Supple, nontender, no adenopathy, no thyromegaly.  CHEST:  Lungs are clear to auscultation, no chest wall tenderness.  No CVA  tenderness.  HEART:  Regular rate and rhythm, no murmur.  ABDOMEN:  Somewhat obese.  Soft.  Diminished bowel sounds.  Liver and spleen  not enlarged.  No palpable mass.  He clearly has localized tenderness,  involuntary guarding and rebound in the right lower quadrant.  He also has  some referred rebound to the right lower quadrant.  LYMPHATIC:  No enlarged lymph nodes of the neck or groin.  EXTREMITIES:  He moves all four extremities well without deformity or pain.  NEUROLOGIC:  No gross motor or sensory deficits.  Detailed neurologic  testing was not performed.   ADMISSION DATA:  Hemoglobin 15.2, white blood cell count 12,400.  Complete  metabolic panel normal.  Lipase normal. Urinalysis normal at primary care.  CT scan does show a thickened appendix but no abscess or signs of rupture,  no other abnormalities.   ASSESSMENT:  1. Acute appendicitis most likely.  2. Human immunodeficiency virus positive.  3. History of cerebrovascular accident.   PLAN:  1. The patient will be taken to the operating room for diagnostic     laparoscopy and probable appendectomy.  2. I have discussed the differential diagnoses with the patient.  He is     aware that we may find mesenteric adenitis, Crohn's disease, other     diagnoses that may alter the operation.  I discussed the indications and     details of surgery with him and his partner.  Risks, complications as     outlined, including but not limited to bleeding, infection, conversion to     open laparotomy, injury to adjacent organs, wound problems such as     infection or hernia, cardiac, pulmonary or thromboembolic problems and he     seems to  understand these issues well.  At this time all of his questions     were answered.  He is in full agreement with going ahead with this plan.                                                Angelia Mould. Derrell Lolling, M.D.    HMI/MEDQ  D:  06/09/2003  T:  06/09/2003  Job:  [id number]   cc:   Fransisco Hertz, M.D.  [address]  [place] [address]  Fax: [phone]

## 2010-11-09 NOTE — Op Note (Signed)
NAME:  Ethan Lambert, Ethan Lambert                        ACCOUNT NO.:  [id number]   MEDICAL RECORD NO.:  [id number]                   PATIENT TYPE:  OIB   LOCATION:  5733                                 FACILITY:  [facility]   PHYSICIAN:  Angelia Mould. Derrell Lolling, M.D.             DATE OF BIRTH:  [birth year]   DATE OF PROCEDURE:  07/08/2003  DATE OF DISCHARGE:                                 OPERATIVE REPORT   PREOPERATIVE DIAGNOSIS:  Chronic cholecystitis with cholelithiasis.   POSTOPERATIVE DIAGNOSIS:  Chronic cholecystitis with cholelithiasis.   OPERATION PERFORMED:  Laparoscopic cholecystectomy with intraoperative  cholangiogram.   SURGEON:  Angelia Mould. Derrell Lolling, M.D.   FIRST ASSISTANT:  Abigail Miyamoto, M.D.   OPERATIVE INDICATIONS:  This is a 50 year old man who is HIV positive and  has a past history of Mycoplasma infection, embolic cerebrovascular  accident.  He recently presented 3-4 weeks ago with acute appendicitis and  underwent an appendectomy.  He recovered uneventfully but then developed  recurrent bouts of right upper quadrant pain, nausea and vomiting.  An  ultrasound was performed which showed gallstones and gallbladder wall  thickening.  It is felt that he has now developed biliary colic.  He did not  want to endure this any longer.  He was brought to the operating room  electively for cholecystectomy.   OPERATIVE FINDINGS:  The gallbladder was somewhat thick-walled and appeared  mildly acutely inflamed.  There was edema of the gallbladder wall.  The  cystic duct was tiny but was patent.  The anatomy of the cystic duct, cystic  artery and common bile duct were conventional.  The cholangiogram was normal  showing normal intrahepatic and extrahepatic biliary anatomy, good flow of  contrast into the duodenum, and no filling defect.  The peritoneal surfaces,  liver, stomach, duodenum and omentum looked normal.   OPERATIVE TECHNIQUE:  Following the induction of general endotracheal  anesthesia, the patient's abdomen was prepped and draped in a sterile  fashion.  Marcaine 0.5% with epinephrine was used as a local infiltration  anesthetic.  A transverse incision was made at the superior rim of the  umbilicus, through the previous scar.  The fascia was opened and a 10 mm  Hasson trocar inserted under direct vision.  This was secured with a  pursestring suture of 0 Vicryl.  Pneumoperitoneum was created.  Video camera  was inserted, with visualization and findings as described above.  A 10 mm  trocar was placed in the subxiphoid region and two 5 mm trocars were placed  in the right mid abdomen.  The gallbladder fundus was elevated.  We  dissected out the cystic duct and the cystic artery.  A cholangiogram  catheter was inserted into the cystic duct and a cholangiogram obtained  using the C-arm.  This showed normal biliary anatomy, no filling defect, and  no evidence of obstruction.  The cholangiogram catheter was removed.  The  cystic duct  was secured with multiple metal clips and divided.  The cystic  artery was isolated, secured with multiple metal clips and divided.  We  isolated the anterior branch and the posterior branch of the cystic artery  separately.  The gallbladder was then dissected from its bed with  electrocautery, placed in the specimen bag and removed.   The operative field and the subphrenic space were copiously irrigated with  saline until all the irrigation fluid was completely clear.  The field was  inspected.  There was no bleeding and no bile leak whatsoever at the  completion of the case.  The trocars were removed under direct vision and  there was no bleeding from the trocar sites.  The pneumoperitoneum was  released.  The fascia at the umbilicus was closed with 0 Vicryl sutures.  The skin incisions were closed with subcuticular sutures of 4-0 Vicryl and  Steri-Strips.  Clean bandages were placed and the patient taken to the  recovery room in  stable condition.  Estimated blood loss was about 20 mL.  Complications - none.  Sponge, needle, and instrument counts were correct.                                               Angelia Mould. Derrell Lolling, M.D.    HMI/MEDQ  D:  07/08/2003  T:  07/09/2003  Job:  [id number]   cc:   Fransisco Hertz, M.D.  [address]  [place] [address]  Fax: [phone]

## 2010-11-12 ENCOUNTER — Encounter: Payer: BC Managed Care – PPO | Admitting: *Deleted

## 2010-11-13 ENCOUNTER — Ambulatory Visit (INDEPENDENT_AMBULATORY_CARE_PROVIDER_SITE_OTHER): Payer: BC Managed Care – PPO | Admitting: *Deleted

## 2010-11-13 DIAGNOSIS — I4891 Unspecified atrial fibrillation: Secondary | ICD-10-CM

## 2010-11-13 DIAGNOSIS — I6350 Cerebral infarction due to unspecified occlusion or stenosis of unspecified cerebral artery: Secondary | ICD-10-CM

## 2010-11-13 LAB — POCT INR: INR: 2.5

## 2010-12-18 ENCOUNTER — Ambulatory Visit (INDEPENDENT_AMBULATORY_CARE_PROVIDER_SITE_OTHER): Payer: BC Managed Care – PPO | Admitting: *Deleted

## 2010-12-18 DIAGNOSIS — I6350 Cerebral infarction due to unspecified occlusion or stenosis of unspecified cerebral artery: Secondary | ICD-10-CM

## 2010-12-18 DIAGNOSIS — I4891 Unspecified atrial fibrillation: Secondary | ICD-10-CM

## 2010-12-18 LAB — POCT INR: INR: 2.6

## 2010-12-20 ENCOUNTER — Other Ambulatory Visit (INDEPENDENT_AMBULATORY_CARE_PROVIDER_SITE_OTHER): Payer: BC Managed Care – PPO

## 2010-12-20 ENCOUNTER — Other Ambulatory Visit: Payer: Self-pay | Admitting: Infectious Diseases

## 2010-12-20 DIAGNOSIS — Z113 Encounter for screening for infections with a predominantly sexual mode of transmission: Secondary | ICD-10-CM

## 2010-12-20 DIAGNOSIS — Z79899 Other long term (current) drug therapy: Secondary | ICD-10-CM

## 2010-12-20 DIAGNOSIS — B20 Human immunodeficiency virus [HIV] disease: Secondary | ICD-10-CM

## 2010-12-21 LAB — LIPID PANEL
Cholesterol: 180 mg/dL (ref 0–200)
HDL: 23 mg/dL — ABNORMAL LOW (ref >39)
LDL Cholesterol: 121 mg/dL — ABNORMAL HIGH (ref 0–99)
Total CHOL/HDL Ratio: 7.8 Ratio
Triglycerides: 180 mg/dL — ABNORMAL HIGH (ref <150)
VLDL: 36 mg/dL (ref 0–40)

## 2010-12-21 LAB — CBC WITH DIFFERENTIAL/PLATELET
Basophils Absolute: 0 10*3/uL (ref 0.0–0.1)
Basophils Relative: 1 % (ref 0–1)
Eosinophils Absolute: 0.2 10*3/uL (ref 0.0–0.7)
Eosinophils Relative: 3 % (ref 0–5)
HCT: 43.8 % (ref 39.0–52.0)
Hemoglobin: 14.5 g/dL (ref 13.0–17.0)
Lymphocytes Relative: 40 % (ref 12–46)
Lymphs Abs: 2.1 10*3/uL (ref 0.7–4.0)
MCH: 29.7 pg (ref 26.0–34.0)
MCHC: 33.1 g/dL (ref 30.0–36.0)
MCV: 89.8 fL (ref 78.0–100.0)
Monocytes Absolute: 0.5 10*3/uL (ref 0.1–1.0)
Monocytes Relative: 10 % (ref 3–12)
Neutro Abs: 2.4 10*3/uL (ref 1.7–7.7)
Neutrophils Relative %: 46 % (ref 43–77)
Platelets: 208 10*3/uL (ref 150–400)
RBC: 4.88 MIL/uL (ref 4.22–5.81)
RDW: 14.4 % (ref 11.5–15.5)
WBC: 5.1 10*3/uL (ref 4.0–10.5)

## 2010-12-21 LAB — COMPLETE METABOLIC PANEL WITH GFR
ALT: 45 U/L (ref 0–53)
AST: 39 U/L — ABNORMAL HIGH (ref 0–37)
Albumin: 4.5 g/dL (ref 3.5–5.2)
Alkaline Phosphatase: 73 U/L (ref 39–117)
BUN: 20 mg/dL (ref 6–23)
CO2: 25 mEq/L (ref 19–32)
Calcium: 9 mg/dL (ref 8.4–10.5)
Chloride: 106 mEq/L (ref 96–112)
Creat: 0.85 mg/dL (ref 0.50–1.35)
GFR, Est African American: >60 mL/min (ref >60)
GFR, Est Non African American: >60 mL/min (ref >60)
Glucose, Bld: 88 mg/dL (ref 70–99)
Potassium: 4.2 mEq/L (ref 3.5–5.3)
Sodium: 140 mEq/L (ref 135–145)
Total Bilirubin: 0.5 mg/dL (ref 0.3–1.2)
Total Protein: 7.8 g/dL (ref 6.0–8.3)

## 2010-12-21 LAB — T-HELPER CELL (CD4) - (RCID CLINIC ONLY)
CD4 % Helper T Cell: 35 % (ref 33–55)
CD4 T Cell Abs: 770 uL (ref 400–2700)

## 2010-12-21 LAB — RPR

## 2010-12-21 LAB — HIV-1 RNA QUANT-NO REFLEX-BLD
HIV 1 RNA Quant: <20 copies/mL (ref <20)
HIV-1 RNA Quant, Log: <1.3 {Log} (ref <1.30)

## 2011-01-03 ENCOUNTER — Encounter: Payer: Self-pay | Admitting: Gastroenterology

## 2011-01-03 ENCOUNTER — Telehealth: Payer: Self-pay | Admitting: *Deleted

## 2011-01-03 ENCOUNTER — Encounter: Payer: Self-pay | Admitting: Infectious Diseases

## 2011-01-03 ENCOUNTER — Other Ambulatory Visit: Payer: Self-pay | Admitting: Infectious Diseases

## 2011-01-03 ENCOUNTER — Ambulatory Visit: Payer: BC Managed Care – PPO | Admitting: Infectious Diseases

## 2011-01-03 DIAGNOSIS — B20 Human immunodeficiency virus [HIV] disease: Secondary | ICD-10-CM

## 2011-01-03 NOTE — Telephone Encounter (Signed)
Called patient to notify of GI appointment at [facility] for 02/11/11 at 9:15 AM with Dr. Russella Dar.  Consultation visit at which time he will be scheduled for a screening colonoscopy. Wendall Mola CMA

## 2011-01-15 ENCOUNTER — Ambulatory Visit (INDEPENDENT_AMBULATORY_CARE_PROVIDER_SITE_OTHER): Payer: BC Managed Care – PPO | Admitting: *Deleted

## 2011-01-15 DIAGNOSIS — I6350 Cerebral infarction due to unspecified occlusion or stenosis of unspecified cerebral artery: Secondary | ICD-10-CM

## 2011-01-15 DIAGNOSIS — I4891 Unspecified atrial fibrillation: Secondary | ICD-10-CM

## 2011-01-15 LAB — POCT INR: INR: 2.5

## 2011-02-11 ENCOUNTER — Ambulatory Visit (INDEPENDENT_AMBULATORY_CARE_PROVIDER_SITE_OTHER): Payer: BC Managed Care – PPO | Admitting: Gastroenterology

## 2011-02-11 ENCOUNTER — Encounter: Payer: Self-pay | Admitting: Gastroenterology

## 2011-02-11 ENCOUNTER — Ambulatory Visit (INDEPENDENT_AMBULATORY_CARE_PROVIDER_SITE_OTHER): Payer: BC Managed Care – PPO | Admitting: *Deleted

## 2011-02-11 DIAGNOSIS — Z7901 Long term (current) use of anticoagulants: Secondary | ICD-10-CM

## 2011-02-11 DIAGNOSIS — I4891 Unspecified atrial fibrillation: Secondary | ICD-10-CM

## 2011-02-11 DIAGNOSIS — Z1211 Encounter for screening for malignant neoplasm of colon: Secondary | ICD-10-CM

## 2011-02-11 DIAGNOSIS — I6350 Cerebral infarction due to unspecified occlusion or stenosis of unspecified cerebral artery: Secondary | ICD-10-CM

## 2011-02-11 LAB — POCT INR: INR: 2.6

## 2011-02-11 NOTE — Progress Notes (Signed)
History of Present Illness: This is a 50 year old male referred for colorectal cancer screening. He relates occasional episodes of slightly loose stools since his cholecystectomy in 2005. He has no other gastrointestinal complaints. He has a grandparent with colon cancer and no other relatives with colon cancer. He has HIV disease maintained on Atripla, hypertension and atrial fibrillation maintained on chronic warfarin. He denies melena, hematochezia, change in bowel habits, abdominal pain, chest pain, weight loss, nausea, vomiting, reflux symptoms.   Past Medical History  Diagnosis Date  . Atrial fibrillation   . Hypertension   . CVA (cerebral infarction)   . HIV disease   . Skin cancer     Non-melanomatous  . Family history of malignant neoplasm of gastrointestinal tract    Past Surgical History  Procedure Date  . Appendectomy   . Cholecystectomy   . Hernia repair     lower right and left    reports that he has never smoked. He has never used smokeless tobacco. He reports that he does not drink alcohol or use illicit drugs. family history includes Arrhythmia in his father; Colon cancer in his maternal grandfather; Heart attack in his brother; Lung cancer in his mother; and Prostate cancer in his maternal grandfather. Allergies  Allergen Reactions  . Morphine    Outpatient Encounter Prescriptions as of 02/11/2011  Medication Sig Dispense Refill  . diltiazem (CARDIZEM CD) 240 MG 24 hr capsule Take 240 mg by mouth daily.        [name] efavirenz-emtrictabine-tenofovir (ATRIPLA) 600-200-300 MG per tablet Take 1 tablet by mouth at bedtime.  90 tablet  4  . flecainide (TAMBOCOR) 150 MG tablet Take two tabs by mouth when in afib       . Omega-3 Fatty Acids (FISH OIL) 1000 MG CAPS Take 2 capsules by mouth at bedtime.        [name] warfarin (COUMADIN) 5 MG tablet Take by mouth as directed.        [name] DISCONTD: flecainide (TAMBOCOR) 100 MG tablet Take 1 tablet (100 mg total) by mouth 2 (two) times daily.   180 tablet  1    Review of Systems: Pertinent positive and negative review of systems were noted in the above HPI section. All other review of systems were otherwise negative.  Physical Exam: General: Well developed , well nourished, no acute distress Head: Normocephalic and atraumatic Eyes:  sclerae anicteric, EOMI Ears: Normal auditory acuity Mouth: No deformity or lesions Neck: Supple, no masses or thyromegaly Lungs: Clear throughout to auscultation Heart: Regular rate and rhythm; no murmurs, rubs or bruits Abdomen: Soft, non tender and non distended. No masses, hepatosplenomegaly or hernias noted. Normal Bowel sounds Rectal: Deferred to colonoscopy Musculoskeletal: Symmetrical with no gross deformities  Skin: No lesions on visible extremities Pulses:  Normal pulses noted Extremities: No clubbing, cyanosis, edema or deformities noted Neurological: Alert oriented x 4, grossly nonfocal Cervical Nodes:  No significant cervical adenopathy Inguinal Nodes: No significant inguinal adenopathy Psychological:  Alert and cooperative. Normal mood and affect  Assessment and Recommendations:  1. Colorectal cancer screening. Average risk. The risks, benefits, and alternatives to colonoscopy with possible biopsy and possible polypectomy were discussed with the patient and they consent to proceed.   2. Atrial fibrillation maintained on warfarin anticoagulation. The risks, benefits and alternatives to a 3 day hold of warfarin was discussed with the patient and he consents to proceed.

## 2011-02-11 NOTE — Patient Instructions (Signed)
You have been set up for a Colonoscopy.  See separate sheet.  SuPrep sample given.    cc: Lina Sayre, MD cc: Arvilla Meres, MD

## 2011-02-28 ENCOUNTER — Telehealth: Payer: Self-pay

## 2011-02-28 NOTE — Telephone Encounter (Signed)
Message copied by Jessee Avers on Thu Feb 28, 2011  7:58 AM ------      Message from: Jessee Avers      Created: Mon Feb 11, 2011  9:42 AM       Coumadin clearance from Dr. Gala Romney

## 2011-03-01 NOTE — Telephone Encounter (Signed)
Spoke with patient and informed him to hold his coumadin 5 days before his procedure. Pt verbalized understanding.

## 2011-03-08 ENCOUNTER — Encounter: Payer: Self-pay | Admitting: Gastroenterology

## 2011-03-08 ENCOUNTER — Ambulatory Visit (AMBULATORY_SURGERY_CENTER): Payer: BC Managed Care – PPO | Admitting: Gastroenterology

## 2011-03-08 VITALS — BP 144/73 | HR 66 | Temp 97.8°F | Resp 20 | Ht 73.0 in | Wt 250.0 lb

## 2011-03-08 DIAGNOSIS — D126 Benign neoplasm of colon, unspecified: Secondary | ICD-10-CM

## 2011-03-08 DIAGNOSIS — Z1211 Encounter for screening for malignant neoplasm of colon: Secondary | ICD-10-CM

## 2011-03-08 MED ORDER — SODIUM CHLORIDE 0.9 % IV SOLN: 500.0000 mL | INTRAVENOUS | Status: DC

## 2011-03-08 NOTE — Patient Instructions (Signed)
REsume your medications, restarting your Coumadin(Warfin) today. Have your PT/INR checked within one week.  High Fiber Diet with liberal fluid intake.  Await pathology results.

## 2011-03-11 ENCOUNTER — Telehealth: Payer: Self-pay

## 2011-03-11 NOTE — Telephone Encounter (Signed)
Not available

## 2011-03-13 ENCOUNTER — Encounter: Payer: Self-pay | Admitting: Gastroenterology

## 2011-03-15 ENCOUNTER — Ambulatory Visit (INDEPENDENT_AMBULATORY_CARE_PROVIDER_SITE_OTHER): Payer: BC Managed Care – PPO | Admitting: *Deleted

## 2011-03-15 DIAGNOSIS — I4891 Unspecified atrial fibrillation: Secondary | ICD-10-CM

## 2011-03-15 DIAGNOSIS — I6350 Cerebral infarction due to unspecified occlusion or stenosis of unspecified cerebral artery: Secondary | ICD-10-CM

## 2011-03-15 LAB — POCT INR: INR: 1.7

## 2011-03-21 LAB — T-HELPER CELL (CD4) - (RCID CLINIC ONLY)
CD4 % Helper T Cell: 35
CD4 T Cell Abs: 680

## 2011-03-26 LAB — T-HELPER CELL (CD4) - (RCID CLINIC ONLY)
CD4 % Helper T Cell: 33
CD4 T Cell Abs: 680

## 2011-04-01 LAB — I-STAT 8, (EC8 V) (CONVERTED LAB)
Acid-Base Excess: 1
BUN: 11
Bicarbonate: 27.2 — ABNORMAL HIGH
Chloride: 106
Glucose, Bld: 93
HCT: 48
Hemoglobin: 16.3
Operator id: 288831
Potassium: 4.1
Sodium: 139
TCO2: 29
pCO2, Ven: 47.4
pH, Ven: 7.366 — ABNORMAL HIGH

## 2011-04-01 LAB — DIFFERENTIAL
Basophils Absolute: 0.1
Basophils Relative: 1
Eosinophils Absolute: 0.1 — ABNORMAL LOW
Eosinophils Relative: 1
Lymphocytes Relative: 22
Lymphs Abs: 1.8
Monocytes Absolute: 0.6
Monocytes Relative: 7
Neutro Abs: 5.7
Neutrophils Relative %: 69

## 2011-04-01 LAB — POCT CARDIAC MARKERS
CKMB, poc: 1.2
CKMB, poc: 1.6
Myoglobin, poc: 69.1
Myoglobin, poc: 74
Operator id: 284251
Operator id: 288831
Troponin i, poc: <0.05
Troponin i, poc: <0.05

## 2011-04-01 LAB — POCT I-STAT CREATININE
Creatinine, Ser: 0.9
Operator id: 288831

## 2011-04-01 LAB — CBC
HCT: 45.4
Hemoglobin: 15.5
MCHC: 34.1
MCV: 91.2
Platelets: 240
RBC: 4.98
RDW: 12.9
WBC: 8.3

## 2011-04-09 ENCOUNTER — Ambulatory Visit (INDEPENDENT_AMBULATORY_CARE_PROVIDER_SITE_OTHER): Payer: BC Managed Care – PPO | Admitting: *Deleted

## 2011-04-09 DIAGNOSIS — I6350 Cerebral infarction due to unspecified occlusion or stenosis of unspecified cerebral artery: Secondary | ICD-10-CM

## 2011-04-09 DIAGNOSIS — I4891 Unspecified atrial fibrillation: Secondary | ICD-10-CM

## 2011-04-09 LAB — POCT INR: INR: 2.2

## 2011-04-11 LAB — T-HELPER CELL (CD4) - (RCID CLINIC ONLY)
CD4 % Helper T Cell: 34
CD4 T Cell Abs: 610

## 2011-05-07 ENCOUNTER — Ambulatory Visit (INDEPENDENT_AMBULATORY_CARE_PROVIDER_SITE_OTHER): Payer: BC Managed Care – PPO | Admitting: *Deleted

## 2011-05-07 DIAGNOSIS — I4891 Unspecified atrial fibrillation: Secondary | ICD-10-CM

## 2011-05-07 DIAGNOSIS — I6350 Cerebral infarction due to unspecified occlusion or stenosis of unspecified cerebral artery: Secondary | ICD-10-CM

## 2011-05-07 LAB — POCT INR: INR: 2.4

## 2011-06-04 ENCOUNTER — Ambulatory Visit (INDEPENDENT_AMBULATORY_CARE_PROVIDER_SITE_OTHER): Payer: BC Managed Care – PPO | Admitting: *Deleted

## 2011-06-04 DIAGNOSIS — I6350 Cerebral infarction due to unspecified occlusion or stenosis of unspecified cerebral artery: Secondary | ICD-10-CM

## 2011-06-04 DIAGNOSIS — I4891 Unspecified atrial fibrillation: Secondary | ICD-10-CM

## 2011-06-04 LAB — POCT INR: INR: 2.4

## 2011-06-28 ENCOUNTER — Other Ambulatory Visit: Payer: Self-pay | Admitting: Internal Medicine

## 2011-06-28 MED ORDER — DILTIAZEM HCL ER COATED BEADS 240 MG PO CP24: 240.0000 mg | ORAL_CAPSULE | Freq: Every day | ORAL | Status: DC

## 2011-06-28 NOTE — Telephone Encounter (Signed)
[facility]

## 2011-07-16 ENCOUNTER — Ambulatory Visit (INDEPENDENT_AMBULATORY_CARE_PROVIDER_SITE_OTHER): Payer: BC Managed Care – PPO | Admitting: *Deleted

## 2011-07-16 DIAGNOSIS — I6350 Cerebral infarction due to unspecified occlusion or stenosis of unspecified cerebral artery: Secondary | ICD-10-CM

## 2011-07-16 DIAGNOSIS — I4891 Unspecified atrial fibrillation: Secondary | ICD-10-CM

## 2011-07-16 LAB — POCT INR: INR: 3.1

## 2011-08-27 ENCOUNTER — Ambulatory Visit (INDEPENDENT_AMBULATORY_CARE_PROVIDER_SITE_OTHER): Payer: BC Managed Care – PPO | Admitting: Pharmacist

## 2011-08-27 DIAGNOSIS — I4891 Unspecified atrial fibrillation: Secondary | ICD-10-CM

## 2011-08-27 DIAGNOSIS — I6350 Cerebral infarction due to unspecified occlusion or stenosis of unspecified cerebral artery: Secondary | ICD-10-CM

## 2011-08-27 LAB — POCT INR: INR: 2.6

## 2011-09-17 ENCOUNTER — Other Ambulatory Visit: Payer: Self-pay | Admitting: *Deleted

## 2011-09-17 MED ORDER — DILTIAZEM HCL ER COATED BEADS 240 MG PO CP24: 240.0000 mg | ORAL_CAPSULE | Freq: Every day | ORAL | Status: DC

## 2011-10-08 ENCOUNTER — Other Ambulatory Visit: Payer: BC Managed Care – PPO

## 2011-10-08 ENCOUNTER — Ambulatory Visit (INDEPENDENT_AMBULATORY_CARE_PROVIDER_SITE_OTHER): Payer: BC Managed Care – PPO

## 2011-10-08 DIAGNOSIS — I4891 Unspecified atrial fibrillation: Secondary | ICD-10-CM

## 2011-10-08 DIAGNOSIS — B20 Human immunodeficiency virus [HIV] disease: Secondary | ICD-10-CM

## 2011-10-08 DIAGNOSIS — I6350 Cerebral infarction due to unspecified occlusion or stenosis of unspecified cerebral artery: Secondary | ICD-10-CM

## 2011-10-08 LAB — COMPREHENSIVE METABOLIC PANEL
ALT: 49 U/L (ref 0–53)
AST: 52 U/L — ABNORMAL HIGH (ref 0–37)
Albumin: 4.2 g/dL (ref 3.5–5.2)
Alkaline Phosphatase: 70 U/L (ref 39–117)
BUN: 13 mg/dL (ref 6–23)
CO2: 23 mEq/L (ref 19–32)
Calcium: 9.2 mg/dL (ref 8.4–10.5)
Chloride: 105 mEq/L (ref 96–112)
Creat: 0.82 mg/dL (ref 0.50–1.35)
Glucose, Bld: 82 mg/dL (ref 70–99)
Potassium: 4.1 mEq/L (ref 3.5–5.3)
Sodium: 138 mEq/L (ref 135–145)
Total Bilirubin: 0.5 mg/dL (ref 0.3–1.2)
Total Protein: 7.3 g/dL (ref 6.0–8.3)

## 2011-10-08 LAB — CBC WITH DIFFERENTIAL/PLATELET
Basophils Absolute: 0.1 10*3/uL (ref 0.0–0.1)
Basophils Relative: 1 % (ref 0–1)
Eosinophils Absolute: 0.2 10*3/uL (ref 0.0–0.7)
Eosinophils Relative: 4 % (ref 0–5)
HCT: 44.1 % (ref 39.0–52.0)
Hemoglobin: 14.8 g/dL (ref 13.0–17.0)
Lymphocytes Relative: 43 % (ref 12–46)
Lymphs Abs: 2.2 10*3/uL (ref 0.7–4.0)
MCH: 30.1 pg (ref 26.0–34.0)
MCHC: 33.6 g/dL (ref 30.0–36.0)
MCV: 89.6 fL (ref 78.0–100.0)
Monocytes Absolute: 0.5 10*3/uL (ref 0.1–1.0)
Monocytes Relative: 9 % (ref 3–12)
Neutro Abs: 2.3 10*3/uL (ref 1.7–7.7)
Neutrophils Relative %: 43 % (ref 43–77)
Platelets: 202 10*3/uL (ref 150–400)
RBC: 4.92 MIL/uL (ref 4.22–5.81)
RDW: 14.1 % (ref 11.5–15.5)
WBC: 5.2 10*3/uL (ref 4.0–10.5)

## 2011-10-08 LAB — POCT INR: INR: 3

## 2011-10-09 LAB — T-HELPER CELL (CD4) - (RCID CLINIC ONLY)
CD4 % Helper T Cell: 36 % (ref 33–55)
CD4 T Cell Abs: 840 uL (ref 400–2700)

## 2011-10-10 LAB — HIV-1 RNA QUANT-NO REFLEX-BLD
HIV 1 RNA Quant: <20 copies/mL (ref <20)
HIV-1 RNA Quant, Log: <1.3 {Log} (ref <1.30)

## 2011-10-18 ENCOUNTER — Other Ambulatory Visit: Payer: Self-pay | Admitting: Pharmacist

## 2011-10-18 ENCOUNTER — Encounter: Payer: Self-pay | Admitting: Cardiovascular Disease

## 2011-10-18 ENCOUNTER — Ambulatory Visit (INDEPENDENT_AMBULATORY_CARE_PROVIDER_SITE_OTHER): Payer: BC Managed Care – PPO | Admitting: Cardiovascular Disease

## 2011-10-18 VITALS — BP 130/82 | HR 70 | Ht 73.0 in | Wt 246.1 lb

## 2011-10-18 DIAGNOSIS — I4891 Unspecified atrial fibrillation: Secondary | ICD-10-CM

## 2011-10-18 DIAGNOSIS — I10 Essential (primary) hypertension: Secondary | ICD-10-CM

## 2011-10-18 MED ORDER — WARFARIN SODIUM 5 MG PO TABS: ORAL_TABLET | ORAL | Status: DC

## 2011-10-18 MED ORDER — DILTIAZEM HCL ER COATED BEADS 240 MG PO CP24: 240.0000 mg | ORAL_CAPSULE | Freq: Every day | ORAL | Status: DC

## 2011-10-18 NOTE — Assessment & Plan Note (Signed)
Maintaining sinus rhythm with occasional use of flecainide. He is tolerating long-term flecainide. LV function is normal by echo. I have recommended continuation of the same therapy. His atrial fib always presents at night and awakes him from sleep. He is at risk for sleep apnea was unable to do a formal sleep study because he cannot sleep outside of his home environment. Will order a 'NightWatch' sleep study to be in the home to rule out obstructive sleep apnea.  For follow-up, I am going to have him see Dr Johney Frame since atrial fib is his only significant cardiac problem. Will arrange an appointment in 6 months.

## 2011-10-18 NOTE — Progress Notes (Signed)
   HPI:  51 year-old gentleman presenting for follow-up evaluation. He is followed for paroxysmal atrial fibrillation. He's previously been followed by Dr Gala Romney. He has been anticoagulated with warfarin considering his past history of TIA. The patient reports 3 episodes of symptomatic atrial fib over the past 6 months. He is treated with 'pill-in-the-pocket' flecainide 300 mg as needed. He reports that symptoms always resolve within 2 hours of taking flecainide. He denies chest pain, dyspnea, or edema. He has no other complaints. He reports no bleeding problems. He avoids caffeine and does not drink alcohol to excess. I reviewed his echo from 2010 and this showed normal LV systolic function with an LVEF of 60% and moderate LVH. There was no intracardiac shunt by bubble study.   Outpatient Encounter Prescriptions as of 10/18/2011  Medication Sig Dispense Refill  . acetaminophen (TYLENOL) 325 MG tablet Take 325 mg by mouth as needed.      . diltiazem (CARDIZEM CD) 240 MG 24 hr capsule Take 1 capsule (240 mg total) by mouth daily.  90 capsule  3  . efavirenz-emtrictabine-tenofovir (ATRIPLA) 600-200-300 MG per tablet Take 1 tablet by mouth at bedtime.  90 tablet  4  . flecainide (TAMBOCOR) 150 MG tablet Take two tabs by mouth when in afib       . Omega-3 Fatty Acids (FISH OIL) 1000 MG CAPS Take 2 capsules by mouth at bedtime.        [name] DISCONTD: diltiazem (CARDIZEM CD) 240 MG 24 hr capsule Take 1 capsule (240 mg total) by mouth daily.  90 capsule  0  . DISCONTD: warfarin (COUMADIN) 5 MG tablet Take 5 mg by mouth as directed. Tuesday and thursdays 5 mg, Monday, Wednesday, Friday, Saturday and Sunday 7.5mg         Allergies  Allergen Reactions  . Morphine     Past Medical History  Diagnosis Date  . Atrial fibrillation   . Hypertension   . CVA (cerebral infarction)   . HIV disease   . Skin cancer     Non-melanomatous  . Family history of malignant neoplasm of gastrointestinal tract     ROS:  Negative except as per HPI  BP 130/82  Pulse 70  Ht 6\' 1"  (1.854 m)  Wt 111.639 kg (246 lb 1.9 oz)  BMI 32.47 kg/m2  PHYSICAL EXAM: Pt is alert and oriented, obese male in NAD HEENT: normal Neck: JVP - normal, carotids 2+= without bruits Lungs: CTA bilaterally CV: RRR without murmur or gallop Abd: soft, NT, Positive BS, no hepatomegaly Ext: no C/C/E, distal pulses intact and equal Skin: warm/dry no rash  EKG:  NSR 70 bpm, within normal limits  ASSESSMENT AND PLAN:

## 2011-10-18 NOTE — Patient Instructions (Signed)
Your physician wants you to follow-up in: 6 MONTHS with Dr Johney Frame. You will receive a reminder letter in the mail two months in advance. If you don't receive a letter, please call our office to schedule the follow-up appointment.  Your physician has recommended that you have a sleep study. (NIGHT WATCH) This test records several body functions during sleep, including: brain activity, eye movement, oxygen and carbon dioxide blood levels, heart rate and rhythm, breathing rate and rhythm, the flow of air through your mouth and nose, snoring, body muscle movements, and chest and belly movement.  Your physician recommends that you continue on your current medications as directed. Please refer to the Current Medication list given to you today.

## 2011-10-18 NOTE — Assessment & Plan Note (Signed)
Controlled on cardizem. Continue the same.

## 2011-10-22 ENCOUNTER — Other Ambulatory Visit: Payer: Self-pay | Admitting: *Deleted

## 2011-10-22 ENCOUNTER — Ambulatory Visit (INDEPENDENT_AMBULATORY_CARE_PROVIDER_SITE_OTHER): Payer: BC Managed Care – PPO | Admitting: Internal Medicine

## 2011-10-22 ENCOUNTER — Encounter: Payer: Self-pay | Admitting: Internal Medicine

## 2011-10-22 ENCOUNTER — Other Ambulatory Visit: Payer: Self-pay

## 2011-10-22 VITALS — BP 135/84 | HR 76 | Temp 98.2°F | Ht 73.0 in | Wt 244.0 lb

## 2011-10-22 DIAGNOSIS — B20 Human immunodeficiency virus [HIV] disease: Secondary | ICD-10-CM

## 2011-10-22 DIAGNOSIS — Z113 Encounter for screening for infections with a predominantly sexual mode of transmission: Secondary | ICD-10-CM

## 2011-10-22 DIAGNOSIS — I4891 Unspecified atrial fibrillation: Secondary | ICD-10-CM

## 2011-10-22 DIAGNOSIS — Z79899 Other long term (current) drug therapy: Secondary | ICD-10-CM

## 2011-10-22 MED ORDER — EFAVIRENZ-EMTRICITAB-TENOFOVIR 600-200-300 MG PO TABS: 1.0000 | ORAL_TABLET | Freq: Every day | ORAL | Status: DC

## 2011-10-22 NOTE — Assessment & Plan Note (Addendum)
He is doing well and will continue with his Atripla. He was reminded to use condoms with all sexual activity. He will return in 6 months for routine followup but was told to return at any time for any acute issues. He does have a mildly elevated triglyceridemia. He has been off of his fish oil for about 3 months since he's not been able to find brand he prefers. I have encouraged him to restart. I will check a fasting lipid panel next visit.

## 2011-10-22 NOTE — Progress Notes (Signed)
  Subjective:    Patient ID: Ethan Lambert, male    DOB: Jun 01, 1961, 51 y.o.   MRN: [id number]  HPI He comes in here for follow up of HIV. He has been on Atripla for several years and has been diagnosed since 1999. He continues to have an undetectable viral load which has had for several years. He reports 100% compliance and excellent tolerance of the medication. He also sees a cardiologist for his paroxysmal A. Fib. He continues on Coumadin and has had no problems with that. He has had no STI's or new issues. He has been out of clinic for more than a year but has had no problems since. He is so about to run out of his medications.   Review of Systems  Constitutional: Negative for fever, chills, fatigue and unexpected weight change.  HENT: Negative for sore throat and trouble swallowing.   Respiratory: Negative for cough and shortness of breath.   Cardiovascular: Negative for chest pain, palpitations and leg swelling.  Gastrointestinal: Negative for nausea, abdominal pain and diarrhea.  Musculoskeletal: Negative for myalgias, joint swelling and arthralgias.  Skin: Negative for rash.  Neurological: Negative for dizziness, weakness and light-headedness.  Hematological: Negative for adenopathy.  Psychiatric/Behavioral: Negative for dysphoric mood. The patient is not nervous/anxious.        Objective:   Physical Exam  Constitutional: He appears well-developed and well-nourished. No distress.  HENT:  Mouth/Throat: Oropharynx is clear and moist. No oropharyngeal exudate.  Cardiovascular: Normal rate, regular rhythm and normal heart sounds.  Exam reveals no gallop and no friction rub.   No murmur heard. Pulmonary/Chest: Effort normal and breath sounds normal. No respiratory distress. He has no wheezes. He has no rales.  Abdominal: Soft. Bowel sounds are normal. He exhibits no distension. There is no tenderness. There is no rebound.  Lymphadenopathy:    He has no cervical adenopathy.  Skin:  Skin is warm and dry. No rash noted.          Assessment & Plan:

## 2011-10-22 NOTE — Patient Instructions (Signed)
Increase your exercise

## 2011-11-20 ENCOUNTER — Telehealth: Payer: Self-pay

## 2011-11-20 ENCOUNTER — Ambulatory Visit (INDEPENDENT_AMBULATORY_CARE_PROVIDER_SITE_OTHER): Payer: BC Managed Care – PPO | Admitting: Pharmacist

## 2011-11-20 DIAGNOSIS — I10 Essential (primary) hypertension: Secondary | ICD-10-CM

## 2011-11-20 DIAGNOSIS — I4891 Unspecified atrial fibrillation: Secondary | ICD-10-CM

## 2011-11-20 DIAGNOSIS — I6350 Cerebral infarction due to unspecified occlusion or stenosis of unspecified cerebral artery: Secondary | ICD-10-CM

## 2011-11-20 LAB — POCT INR: INR: 2.9

## 2011-11-20 NOTE — Telephone Encounter (Signed)
I spoke with the pt and made him aware that home sleep study is not approved at this time.  The pt would like to proceed with sleep study in the hospital setting.  I will place order for this test and have the [facility] department contact the pt with appt.

## 2011-11-20 NOTE — Telephone Encounter (Signed)
I left the pt a message to call the office about a Night Watch Sleep Study. We have contacted the pt's insurance and this test is not approved because it has been a couple of years since the pt attempted the hospital sleep study.  The pt will need to do sleep study in hospital setting again before an outpatient sleep study can be attempted.

## 2011-11-20 NOTE — Telephone Encounter (Signed)
Follow up from previous call:  Patient return call back to nurse. Lauren .

## 2011-11-21 ENCOUNTER — Telehealth: Payer: Self-pay | Admitting: *Deleted

## 2011-11-21 NOTE — Telephone Encounter (Signed)
Left message for Ethan Lambert to call and schedule sleep study.

## 2012-01-01 ENCOUNTER — Ambulatory Visit (INDEPENDENT_AMBULATORY_CARE_PROVIDER_SITE_OTHER): Payer: BC Managed Care – PPO | Admitting: *Deleted

## 2012-01-01 DIAGNOSIS — I6350 Cerebral infarction due to unspecified occlusion or stenosis of unspecified cerebral artery: Secondary | ICD-10-CM

## 2012-01-01 DIAGNOSIS — I4891 Unspecified atrial fibrillation: Secondary | ICD-10-CM

## 2012-01-01 LAB — POCT INR: INR: 2.6

## 2012-01-03 ENCOUNTER — Ambulatory Visit (HOSPITAL_BASED_OUTPATIENT_CLINIC_OR_DEPARTMENT_OTHER): Payer: BC Managed Care – PPO | Attending: Cardiovascular Disease

## 2012-01-03 VITALS — Ht 73.0 in | Wt 232.0 lb

## 2012-01-03 DIAGNOSIS — I4891 Unspecified atrial fibrillation: Secondary | ICD-10-CM

## 2012-01-03 DIAGNOSIS — I10 Essential (primary) hypertension: Secondary | ICD-10-CM

## 2012-01-03 DIAGNOSIS — G4733 Obstructive sleep apnea (adult) (pediatric): Secondary | ICD-10-CM | POA: Insufficient documentation

## 2012-01-03 DIAGNOSIS — G4737 Central sleep apnea in conditions classified elsewhere: Secondary | ICD-10-CM | POA: Insufficient documentation

## 2012-01-11 DIAGNOSIS — G4733 Obstructive sleep apnea (adult) (pediatric): Secondary | ICD-10-CM

## 2012-01-11 DIAGNOSIS — G4737 Central sleep apnea in conditions classified elsewhere: Secondary | ICD-10-CM

## 2012-01-12 NOTE — Procedures (Signed)
NAMECOLA, HIGHFILL              ACCOUNT NO.:  [id number]  MEDICAL RECORD NO.:  [id number]          PATIENT TYPE:  OUT  LOCATION:  SLEEP CENTER                 [facility]ACILITY[facility]  [facility]  PHYSICIAN:  Barbaraann Share, MD,FCCPDATE OF BIRTH:  1961/02/17  DATE OF STUDY:                           NOCTURNAL POLYSOMNOGRAM  REFERRING PHYSICIAN:  Veverly Fells. Excell Seltzer, MD  LOCATION:  Sleep lab.  INDICATION FOR STUDY:  Hypersomnia with sleep apnea.  EPWORTH SLEEPINESS SCORE:  16.  SLEEP ARCHITECTURE:  The patient was found to have a total sleep time of 343 minutes with no slow-wave sleep and only 63 minutes of REM.  Sleep onset latency was normal at 9.5 minutes and REM onset was normal at 63 minutes.  Sleep efficiency was moderately reduced at 78%.  RESPIRATORY DATA:  The patient was found to have 23 central and obstructive apneas as well as 15 obstructive hypopneas, giving him an apnea-hypopnea index of 7 events per hour.  The events occurred in all body positions and there was loud snoring throughout.  The patient did not meet split night criteria secondary to his small numbers of events.  OXYGEN DATA:  There was transient O2 desaturation as low as 85% with the patient's obstructive events.  CARDIAC DATA:  Occasional PAC and PVC noted, but no clinically significant arrhythmias were seen.  MOVEMENTS-PARASOMNIA:  The patient had no significant leg jerks or other abnormal behavior seen.  IMPRESSION-RECOMMENDATIONS: 1. 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  Treatment     for this degree of sleep apnea can include a trial of weight loss     alone, upper airway surgery, dental appliance, and also CPAP.  The     decision to treat this mild degree of sleep apnea should be based     upon its impact to his quality of life.  This is unlikely to     represent a significant cardiovascular risk for     him. 2. Occasional  PAC and PVCs noted.     Barbaraann Share, MD,FCCP Diplomate, American Board of Sleep Medicine    [facility]/MEDQ  D:  01/11/2012 17:04:48  T:  01/12/2012 01:46:07  Job:  [id number]

## 2012-01-21 ENCOUNTER — Telehealth: Payer: Self-pay | Admitting: Cardiovascular Disease

## 2012-01-21 NOTE — Telephone Encounter (Signed)
Patient called no answer.LMTC. 

## 2012-01-21 NOTE — Telephone Encounter (Signed)
Advised patient no treatment required

## 2012-01-21 NOTE — Telephone Encounter (Signed)
Patient returning nurse call regarding sleep study, he can be reached at [phone]

## 2012-01-21 NOTE — Telephone Encounter (Signed)
Message copied by Burnell Blanks on Tue Jan 21, 2012 12:00 PM ------      Message from: Tonny Bollman      Created: Wed Jan 15, 2012  4:57 PM      Regarding: RE: sleep study results in notes tab       Reviewed and sounds like this doesn't require treatment      ----- Message -----         From: Sharyn Blitz, RN         Sent: 01/15/2012  10:25 AM           To: Tonny Bollman, MD      Subject: sleep study results in notes tab

## 2012-02-12 ENCOUNTER — Ambulatory Visit (INDEPENDENT_AMBULATORY_CARE_PROVIDER_SITE_OTHER): Payer: BC Managed Care – PPO | Admitting: Pharmacist

## 2012-02-12 DIAGNOSIS — I4891 Unspecified atrial fibrillation: Secondary | ICD-10-CM

## 2012-02-12 DIAGNOSIS — I6350 Cerebral infarction due to unspecified occlusion or stenosis of unspecified cerebral artery: Secondary | ICD-10-CM

## 2012-02-12 LAB — POCT INR: INR: 3

## 2012-03-25 ENCOUNTER — Ambulatory Visit (INDEPENDENT_AMBULATORY_CARE_PROVIDER_SITE_OTHER): Payer: BC Managed Care – PPO | Admitting: Pharmacist

## 2012-03-25 DIAGNOSIS — I6350 Cerebral infarction due to unspecified occlusion or stenosis of unspecified cerebral artery: Secondary | ICD-10-CM

## 2012-03-25 DIAGNOSIS — I4891 Unspecified atrial fibrillation: Secondary | ICD-10-CM

## 2012-03-25 LAB — POCT INR: INR: 2.5

## 2012-04-16 ENCOUNTER — Other Ambulatory Visit: Payer: BC Managed Care – PPO

## 2012-04-16 DIAGNOSIS — B20 Human immunodeficiency virus [HIV] disease: Secondary | ICD-10-CM

## 2012-04-16 DIAGNOSIS — Z113 Encounter for screening for infections with a predominantly sexual mode of transmission: Secondary | ICD-10-CM

## 2012-04-16 DIAGNOSIS — Z79899 Other long term (current) drug therapy: Secondary | ICD-10-CM

## 2012-04-17 LAB — CBC WITH DIFFERENTIAL/PLATELET
Basophils Absolute: 0 10*3/uL (ref 0.0–0.1)
Basophils Relative: 1 % (ref 0–1)
Eosinophils Absolute: 0.1 10*3/uL (ref 0.0–0.7)
Eosinophils Relative: 2 % (ref 0–5)
HCT: 44.2 % (ref 39.0–52.0)
Hemoglobin: 15.3 g/dL (ref 13.0–17.0)
Lymphocytes Relative: 21 % (ref 12–46)
Lymphs Abs: 1.3 10*3/uL (ref 0.7–4.0)
MCH: 30.3 pg (ref 26.0–34.0)
MCHC: 34.6 g/dL (ref 30.0–36.0)
MCV: 87.5 fL (ref 78.0–100.0)
Monocytes Absolute: 0.6 10*3/uL (ref 0.1–1.0)
Monocytes Relative: 10 % (ref 3–12)
Neutro Abs: 3.8 10*3/uL (ref 1.7–7.7)
Neutrophils Relative %: 66 % (ref 43–77)
Platelets: 218 10*3/uL (ref 150–400)
RBC: 5.05 MIL/uL (ref 4.22–5.81)
RDW: 15 % (ref 11.5–15.5)
WBC: 5.8 10*3/uL (ref 4.0–10.5)

## 2012-04-17 LAB — COMPREHENSIVE METABOLIC PANEL
ALT: 18 U/L (ref 0–53)
AST: 25 U/L (ref 0–37)
Albumin: 4.4 g/dL (ref 3.5–5.2)
Alkaline Phosphatase: 79 U/L (ref 39–117)
BUN: 11 mg/dL (ref 6–23)
CO2: 26 mEq/L (ref 19–32)
Calcium: 9.6 mg/dL (ref 8.4–10.5)
Chloride: 106 mEq/L (ref 96–112)
Creat: 0.86 mg/dL (ref 0.50–1.35)
Glucose, Bld: 81 mg/dL (ref 70–99)
Potassium: 4 mEq/L (ref 3.5–5.3)
Sodium: 138 mEq/L (ref 135–145)
Total Bilirubin: 0.7 mg/dL (ref 0.3–1.2)
Total Protein: 7.5 g/dL (ref 6.0–8.3)

## 2012-04-17 LAB — LIPID PANEL
Cholesterol: 178 mg/dL (ref 0–200)
HDL: 30 mg/dL — ABNORMAL LOW (ref >39)
LDL Cholesterol: 128 mg/dL — ABNORMAL HIGH (ref 0–99)
Total CHOL/HDL Ratio: 5.9 Ratio
Triglycerides: 99 mg/dL (ref <150)
VLDL: 20 mg/dL (ref 0–40)

## 2012-04-17 LAB — T-HELPER CELL (CD4) - (RCID CLINIC ONLY)
CD4 % Helper T Cell: 40 % (ref 33–55)
CD4 T Cell Abs: 580 uL (ref 400–2700)

## 2012-04-17 LAB — RPR

## 2012-04-19 LAB — HIV-1 RNA QUANT-NO REFLEX-BLD
HIV 1 RNA Quant: <20 copies/mL (ref <20)
HIV-1 RNA Quant, Log: <1.3 {Log} (ref <1.30)

## 2012-04-22 ENCOUNTER — Telehealth: Payer: Self-pay | Admitting: Internal Medicine

## 2012-04-22 NOTE — Telephone Encounter (Signed)
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 Is there an alternative.

## 2012-04-22 NOTE — Telephone Encounter (Signed)
Will set him up to see Dr Johney Frame on 05/20/12 to discuss.  Lela is going to call the patient

## 2012-04-27 ENCOUNTER — Ambulatory Visit (INDEPENDENT_AMBULATORY_CARE_PROVIDER_SITE_OTHER): Payer: BC Managed Care – PPO | Admitting: Internal Medicine

## 2012-04-27 ENCOUNTER — Encounter: Payer: Self-pay | Admitting: Internal Medicine

## 2012-04-27 VITALS — BP 120/78 | HR 65 | Ht 73.0 in | Wt 215.0 lb

## 2012-04-27 DIAGNOSIS — I10 Essential (primary) hypertension: Secondary | ICD-10-CM

## 2012-04-27 DIAGNOSIS — I4891 Unspecified atrial fibrillation: Secondary | ICD-10-CM

## 2012-04-27 NOTE — Assessment & Plan Note (Addendum)
The patient has symptomatic paroxysmal atrial fibrillation controlled with flecainide.  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  We had a long discussion today regarding treatment options.  Given his HTN and prior stroke (CHADS2 score =3, CHADSVASC=3), current guidelines would recommend either coumadin or a novel anticoagulation at this time.  I think that his risks of stroke would be higher with ASA.  I did discuss all three FDA approved novel anticoagulants in detail also today.  I specifically discussed Eliquis which had significant reduction in stroke without increased bleeding risks when compared to ASA in the AVERROES study.  I did inform him that presently there is no reversal agent for Eliquis (or any novel anticoagulant).  I told him that afib ablation has not yet been shown to be an alternative to anticoagulation and that given good control of his afib with flecainide, ablation is not currently recommended.   Finally, we discussed left atrial appendage occlusive devices such as the Watchman device.  Though this device is not presently FDA approved, there are ongoing clinical trials that he might potentially qualify for at other institutions.  He is very clear that he does not wish to consider left atrial appendage occluder devices at this time. He would prefer that I contact the [facility] office to see if eliquis would be an acceptable alternative to coumadin.  He provided me with Wilhemina Cash Moore's number [phone]) and asked that I call him. I did call Lt Moore's office today while with the patient but had to leave a voicemail for the call to be returned as there was no answer. I have encouraged the patient to follow-up on this matter himself but informed him that I would be happy to assist further if necessary. I will see him annually.  He will contact me if any further questions, issues, or problems arise in  the interim.    Addendum 05/14/12 The patient has begun eliquis, an alternative to coumadin.  As above, the AVERROES study would suggest that this medicine has a bleeding profile similar to aspirin.  I have spoken with Dr Garald Balding (with [facility]) who is willing to review the patient's file to make further consideration for possible employment with the [employer] office. I will fax my note to Dr Garald Balding at this time.

## 2012-04-27 NOTE — Assessment & Plan Note (Signed)
Stable No change required today  

## 2012-04-27 NOTE — Progress Notes (Signed)
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 male with a h/o prior CVA, HTN, and paroxysmal atrial fibrillation who presents for EP consultation.  He reports initially being diagnosed with atrial fibrillation in 2010.  He has been on coumadin since that time.  He was previously on coumadin after a stroke in 1999 however this was discontinued in 2000.  He has done well with flecainide as a " pill in pocket" approach.  Presently, he has not had afib in several months.  He had a sleep study 7/13 which did not reveal significant sleep apnea per Dr Excell Seltzer.  He is unaware of any triggers for his afib.  During episodes, he reports symptoms of palpitations and fatigue.  He is tired after an episode for several hours.  Episodes of afib typically last 1-2 hours at a time. Today, he denies symptoms of chest pain, shortness of breath, orthopnea, PND, lower extremity edema, dizziness, presyncope, syncope, or neurologic sequela. The patient is tolerating medications without difficulties and is otherwise without complaint today.  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  He would therefore like to stop coumadin if possible.  Past Medical History  Diagnosis Date  . Paroxysmal atrial fibrillation   . Hypertension   . CVA (cerebral infarction) 1999  . HIV disease dx 1999  . Skin cancer     Non-melanomatous  . Family history of malignant neoplasm of gastrointestinal tract    Past Surgical History  Procedure Date  . Appendectomy   . Cholecystectomy   . Hernia repair     lower right and left    Current Outpatient Prescriptions  Medication Sig Dispense Refill  . acetaminophen (TYLENOL) 325 MG tablet Take 325 mg by mouth as needed.      . diltiazem (CARDIZEM CD) 240 MG 24 hr capsule Take 1 capsule (240 mg total) by mouth daily.  90 capsule  3  . efavirenz-emtrictabine-tenofovir (ATRIPLA) 600-200-300 MG per tablet Take 1  tablet by mouth at bedtime.  90 tablet  4  . flecainide (TAMBOCOR) 150 MG tablet Take two tabs by mouth when in afib       . warfarin (COUMADIN) 5 MG tablet Take as directed by Anticoagulation clinic.  ( 7.5mg  daily except 5mg  on Tuesday and Thursday)  135 tablet  1    Allergies  Allergen Reactions  . Morphine     History   Social History  . Marital Status: Single    Spouse Name: N/A    Number of Children: 1  . Years of Education: N/A   Occupational History  . [employer] CLERK Product manager   Social History Main Topics  . Smoking status: Never Smoker   . Smokeless tobacco: Never Used  . Alcohol Use: Yes     Comment: rare  . Drug Use: No  . Sexually Active: Yes     Comment: pt. given condoms   Other Topics Concern  . Not on file   Social History Narrative   Pt lives in [place].  Unemployed.    Family History  Problem Relation Age of Onset  . Arrhythmia Father   . Heart attack Brother   . Prostate cancer Maternal Grandfather   . Colon cancer Maternal Grandfather   . Lung cancer Mother     smoker    ROS- All systems are reviewed and negative except as per the HPI above  Physical Exam: Filed Vitals:   04/27/12 0847  BP: 120/78  Pulse: 65  Height: 6\' 1"  (1.854 m)  Weight: 215 lb (97.523 kg)  SpO2: 98%    GEN- The patient is well appearing, alert and oriented x 3 today.   Head- normocephalic, atraumatic Eyes-  Sclera clear, conjunctiva pink Ears- hearing intact Oropharynx- clear Neck- supple, no JVP Lymph- no cervical lymphadenopathy Lungs- Clear to ausculation bilaterally, normal work of breathing Heart- Regular rate and rhythm, no murmurs, rubs or gallops, PMI not laterally displaced GI- soft, NT, ND, + BS Extremities- no clubbing, cyanosis, or edema MS- no significant deformity or atrophy Skin- no rash or lesion Psych- euthymic mood, full affect Neuro- strength and sensation are intact  EKG today reveals sinus rhythm 65 bpm, LAD,  otherwise normal ekg  Assessment and Plan:

## 2012-04-27 NOTE — Patient Instructions (Signed)
Your physician wants you to follow-up in: 12 months You will receive a reminder letter in the mail two months in advance. If you don't receive a letter, please call our office to schedule the follow-up appointment.   I have left a message for LT Youlanda Roys ([phone]) to return Dr Jenel Lucks call

## 2012-04-29 ENCOUNTER — Telehealth: Payer: Self-pay | Admitting: Internal Medicine

## 2012-04-29 NOTE — Telephone Encounter (Signed)
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  Because he can not get a job due to his health conditions would Dr Johney Frame support his getting disability?  Let him know I would discuss with him and call him back

## 2012-04-29 NOTE — Telephone Encounter (Signed)
pt was in Monday 11-4, has questions re disability

## 2012-04-30 ENCOUNTER — Encounter: Payer: Self-pay | Admitting: Internal Medicine

## 2012-04-30 ENCOUNTER — Ambulatory Visit (INDEPENDENT_AMBULATORY_CARE_PROVIDER_SITE_OTHER): Payer: BC Managed Care – PPO | Admitting: Internal Medicine

## 2012-04-30 VITALS — BP 139/88 | HR 69 | Temp 98.1°F | Ht 73.0 in | Wt 232.0 lb

## 2012-04-30 DIAGNOSIS — B20 Human immunodeficiency virus [HIV] disease: Secondary | ICD-10-CM

## 2012-04-30 NOTE — Progress Notes (Signed)
  Subjective:    Patient ID: BLADYN TIPPS, male    DOB: 19-Jun-1961, 51 y.o.   MRN: [id number]  HPI He comes in for followup of his HIV. He continues to be on Atripla and remains undetectable with a good CD4 count. He denies any missed doses. He continues to be on Coumadin for paroxysmal atrial fibrillation. He is having difficulty after losing his job though he does continue to have [employer].   Review of Systems  Constitutional: Negative for fever, fatigue and unexpected weight change.  HENT: Negative for sore throat and trouble swallowing.   Respiratory: Negative for cough and shortness of breath.   Gastrointestinal: Negative for nausea, abdominal pain and diarrhea.  Musculoskeletal: Negative for myalgias, joint swelling and arthralgias.  Skin: Negative for rash.       Objective:   Physical Exam  Constitutional: He appears well-developed and well-nourished. No distress.  Cardiovascular: Normal rate, regular rhythm and normal heart sounds.   Pulmonary/Chest: Effort normal and breath sounds normal. No respiratory distress. He has no wheezes. He has no rales.          Assessment & Plan:

## 2012-04-30 NOTE — Assessment & Plan Note (Signed)
He is doing well with his HIV. I did discuss with him different funding mechanisms if he does lose his insurance. He knows to call if he has any issues with anticipation. He knows that he can have continual coverage with his Atripla if needed. Otherwise he will return in 6 months for routine followup. He also is going to find a primary care physician to help with other issues

## 2012-05-05 NOTE — Telephone Encounter (Signed)
Lt Ethan Lambert called back today and I gave him Dr Allred's cell number to call in regards to this patient.I did not discuss any health issues with Mr. Ethan Lambert.  However, I have left the patient a meesage to call me back as Dr Ethan Lambert will not support disability.  He says that he can work and can not support disability for him.  Patient just states that he feels its going to be hard to find a job.  I left message for patient to call me back in regards to my conversation with Dr Ethan Lambert.

## 2012-05-06 ENCOUNTER — Telehealth: Payer: Self-pay | Admitting: Internal Medicine

## 2012-05-06 ENCOUNTER — Ambulatory Visit (INDEPENDENT_AMBULATORY_CARE_PROVIDER_SITE_OTHER): Payer: BC Managed Care – PPO | Admitting: *Deleted

## 2012-05-06 DIAGNOSIS — I4891 Unspecified atrial fibrillation: Secondary | ICD-10-CM

## 2012-05-06 DIAGNOSIS — I6350 Cerebral infarction due to unspecified occlusion or stenosis of unspecified cerebral artery: Secondary | ICD-10-CM

## 2012-05-06 LAB — POCT INR: INR: 1.9

## 2012-05-06 NOTE — Telephone Encounter (Signed)
Walk in pt form " Pt wants Blood Pressure Change" sent to [facility]  05/06/12/KM

## 2012-05-08 MED ORDER — APIXABAN 5 MG PO TABS: 5.0000 mg | ORAL_TABLET | Freq: Two times a day (BID) | ORAL | Status: DC

## 2012-05-08 NOTE — Telephone Encounter (Signed)
PT RTN CALL TO KELLY, PLS CALL [phone]

## 2012-05-08 NOTE — Telephone Encounter (Signed)
Dr Johney Frame has said okay for Eliquis 5mg  bid

## 2012-05-12 NOTE — Telephone Encounter (Signed)
Pt rtn your call, [phone]

## 2012-05-12 NOTE — Telephone Encounter (Signed)
The MD that they want Dr Johney Frame to talk to, Patient has but it is not with him now.  He will call back tomorrow and give me the number.  Patient aware I'm off but t give the number to the schedulers

## 2012-05-12 NOTE — Telephone Encounter (Signed)
lmom for pt to return my call. 

## 2012-05-12 NOTE — Telephone Encounter (Signed)
F/u   Patient calling back to f/u on this medication, plz return call to pt at [phone]

## 2012-05-14 ENCOUNTER — Encounter: Payer: Self-pay | Admitting: Internal Medicine

## 2012-05-14 NOTE — Telephone Encounter (Signed)
Ethan Lambert 05/14/2012 8:18 AM Signed  New Problem:  Patient called in wanting to give you information about another physican's name and number so Dr. Johney Frame can speak with him. Please call back.

## 2012-05-14 NOTE — Telephone Encounter (Signed)
Call Documentation     Ethan Lambert 05/14/2012 10:39 AM Signed  Pt calling re needing a call from [facility] re info form another dr, pls call

## 2012-05-14 NOTE — Telephone Encounter (Signed)
New Problem:    Patient called in wanting to give you information about another physican's name and number so Dr. Johney Frame can speak with him.  Please call back.

## 2012-05-14 NOTE — Telephone Encounter (Signed)
This encounter was created in error - please disregard.

## 2012-05-14 NOTE — Telephone Encounter (Signed)
Dr Johney Frame received a call from Alfredia Client and gave him the number of Dr Garald Balding with [facility].  Dr Johney Frame has spoken with him and we will fax over Dr Allred's note.  Patient aware

## 2012-05-14 NOTE — Telephone Encounter (Signed)
Pt calling re needing a call from [facility] re info form another dr, pls call

## 2012-05-14 NOTE — Telephone Encounter (Signed)
Will call Dr Garald Balding today at [phone] with [facility]

## 2012-05-20 ENCOUNTER — Telehealth: Payer: Self-pay | Admitting: Internal Medicine

## 2012-05-20 NOTE — Telephone Encounter (Signed)
New problem:    What was the discussion with Dr. Johney Frame  & Dr. Tedra Coupe  Regarding Eliquis.

## 2012-05-20 NOTE — Telephone Encounter (Signed)
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  I explained to the patient that the decision was out of Dr Jenel Lucks hands at this point
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
## 2012-05-20 NOTE — Telephone Encounter (Signed)
Fax # at Dr Demaris Callander office is [phone] I have spoken with Maurine Minister at there office and will fax over the note again as I did on 05/14/12.  Patient aware and knows I am refaxing

## 2012-05-26 ENCOUNTER — Telehealth: Payer: Self-pay | Admitting: Internal Medicine

## 2012-05-26 NOTE — Telephone Encounter (Signed)
Will need an appointment with [facility] and will hold two days prior to ensure the INR is < 2.0

## 2012-05-26 NOTE — Telephone Encounter (Signed)
New Problem:    Patient called in wanting to know what the special instructions were for his apixaban (ELIQUIS) 5 MG TABS tablet.  Please call back.

## 2012-05-27 ENCOUNTER — Telehealth: Payer: Self-pay | Admitting: Internal Medicine

## 2012-05-27 NOTE — Telephone Encounter (Signed)
Walk In Pt Form "Pt Needs letter Stating ok when to Return to Work" Sent to [facility]/Allred  05/27/12/KM

## 2012-05-29 ENCOUNTER — Ambulatory Visit (INDEPENDENT_AMBULATORY_CARE_PROVIDER_SITE_OTHER): Payer: BC Managed Care – PPO

## 2012-05-29 DIAGNOSIS — I4891 Unspecified atrial fibrillation: Secondary | ICD-10-CM

## 2012-05-29 DIAGNOSIS — I6350 Cerebral infarction due to unspecified occlusion or stenosis of unspecified cerebral artery: Secondary | ICD-10-CM

## 2012-05-29 LAB — POCT INR: INR: 1.4

## 2012-07-21 ENCOUNTER — Other Ambulatory Visit: Payer: Self-pay | Admitting: Physician Assistant

## 2012-07-22 LAB — HEPATITIS B SURFACE ANTIBODY, QUANTITATIVE: Hep B S AB Quant (Post): 0.2 m[IU]/mL

## 2012-08-21 ENCOUNTER — Other Ambulatory Visit: Payer: Self-pay | Admitting: Internal Medicine

## 2012-08-21 ENCOUNTER — Telehealth: Payer: Self-pay | Admitting: *Deleted

## 2012-08-21 DIAGNOSIS — B20 Human immunodeficiency virus [HIV] disease: Secondary | ICD-10-CM

## 2012-08-21 NOTE — Telephone Encounter (Signed)
error 

## 2012-09-25 ENCOUNTER — Emergency Department (HOSPITAL_COMMUNITY)
Admission: EM | Admit: 2012-09-25 | Discharge: 2012-09-25 | Disposition: A | Discharge status: Home / Self Care | Payer: BC Managed Care – PPO | Attending: Emergency Medicine | Admitting: Emergency Medicine

## 2012-09-25 ENCOUNTER — Emergency Department (HOSPITAL_COMMUNITY): Payer: BC Managed Care – PPO

## 2012-09-25 ENCOUNTER — Encounter (HOSPITAL_COMMUNITY): Payer: Self-pay | Admitting: Emergency Medicine

## 2012-09-25 DIAGNOSIS — I10 Essential (primary) hypertension: Secondary | ICD-10-CM | POA: Insufficient documentation

## 2012-09-25 DIAGNOSIS — Z8679 Personal history of other diseases of the circulatory system: Secondary | ICD-10-CM | POA: Insufficient documentation

## 2012-09-25 DIAGNOSIS — Y92410 Unspecified street and highway as the place of occurrence of the external cause: Secondary | ICD-10-CM | POA: Insufficient documentation

## 2012-09-25 DIAGNOSIS — S335XXA Sprain of ligaments of lumbar spine, initial encounter: Secondary | ICD-10-CM | POA: Insufficient documentation

## 2012-09-25 DIAGNOSIS — Z21 Asymptomatic human immunodeficiency virus [HIV] infection status: Secondary | ICD-10-CM | POA: Insufficient documentation

## 2012-09-25 DIAGNOSIS — Y9389 Activity, other specified: Secondary | ICD-10-CM | POA: Insufficient documentation

## 2012-09-25 DIAGNOSIS — S39012A Strain of muscle, fascia and tendon of lower back, initial encounter: Secondary | ICD-10-CM

## 2012-09-25 DIAGNOSIS — Z8673 Personal history of transient ischemic attack (TIA), and cerebral infarction without residual deficits: Secondary | ICD-10-CM | POA: Insufficient documentation

## 2012-09-25 DIAGNOSIS — Z79899 Other long term (current) drug therapy: Secondary | ICD-10-CM | POA: Insufficient documentation

## 2012-09-25 DIAGNOSIS — Z85828 Personal history of other malignant neoplasm of skin: Secondary | ICD-10-CM | POA: Insufficient documentation

## 2012-09-25 MED ORDER — HYDROCODONE-ACETAMINOPHEN 5-325 MG PO TABS: 2.0000 | ORAL_TABLET | ORAL | Status: DC | PRN

## 2012-09-25 MED ORDER — IBUPROFEN 800 MG PO TABS: 800.0000 mg | ORAL_TABLET | Freq: Three times a day (TID) | ORAL | Status: DC

## 2012-09-25 MED ORDER — METHOCARBAMOL 500 MG PO TABS: 500.0000 mg | ORAL_TABLET | Freq: Two times a day (BID) | ORAL | Status: DC

## 2012-09-25 NOTE — ED Notes (Signed)
Pt escorted to discharge window. Verbalized understanding discharge instructions. In no acute distress.   

## 2012-09-25 NOTE — ED Notes (Signed)
PA at bedside.
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
## 2012-09-25 NOTE — ED Notes (Signed)
Patient transported to X-ray 
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
## 2012-09-25 NOTE — ED Provider Notes (Signed)
Medical screening examination/treatment/procedure(s) were performed by non-physician practitioner and as supervising physician I was immediately available for consultation/collaboration.  Ethelda Chick, MD 09/25/12 [phone]

## 2012-10-09 ENCOUNTER — Other Ambulatory Visit: Payer: Self-pay | Admitting: Cardiovascular Disease

## 2012-10-28 ENCOUNTER — Other Ambulatory Visit (INDEPENDENT_AMBULATORY_CARE_PROVIDER_SITE_OTHER): Payer: BC Managed Care – PPO

## 2012-10-28 ENCOUNTER — Other Ambulatory Visit: Payer: Self-pay | Admitting: Infectious Diseases

## 2012-10-28 DIAGNOSIS — B20 Human immunodeficiency virus [HIV] disease: Secondary | ICD-10-CM

## 2012-10-29 LAB — T-HELPER CELL (CD4) - (RCID CLINIC ONLY)
CD4 % Helper T Cell: 30 % — ABNORMAL LOW (ref 33–55)
CD4 T Cell Abs: 510 uL (ref 400–2700)

## 2012-10-30 LAB — HIV-1 RNA QUANT-NO REFLEX-BLD
HIV 1 RNA Quant: <20 copies/mL (ref <20)
HIV-1 RNA Quant, Log: <1.3 {Log} (ref <1.30)

## 2012-11-10 ENCOUNTER — Ambulatory Visit (INDEPENDENT_AMBULATORY_CARE_PROVIDER_SITE_OTHER): Payer: 59 | Admitting: Internal Medicine

## 2012-11-10 ENCOUNTER — Encounter: Payer: Self-pay | Admitting: Internal Medicine

## 2012-11-10 VITALS — BP 136/85 | HR 64 | Temp 98.2°F | Ht 73.0 in | Wt 210.0 lb

## 2012-11-10 DIAGNOSIS — B20 Human immunodeficiency virus [HIV] disease: Secondary | ICD-10-CM

## 2012-11-10 DIAGNOSIS — Z9889 Other specified postprocedural states: Secondary | ICD-10-CM

## 2012-11-10 DIAGNOSIS — Z23 Encounter for immunization: Secondary | ICD-10-CM

## 2012-11-10 NOTE — Assessment & Plan Note (Signed)
No current problems

## 2012-11-10 NOTE — Progress Notes (Signed)
  Subjective:    Patient ID: Ethan Lambert, male    DOB: [birth year], 52 y.o.   MRN: [id number]  HPI He comes in for follow up of HIV.  He recently started working for [employer].  Takes anticoagulation for afib and doing well.  Work schedule is 2weeks days and 2 weeks nights.  On transition to days or nights he takes his Atripla early to accodmodate the schedule.  No significant dizziness or complaints with that, just concerned that he is not taking it correctly.  No sleep disturbances.     Review of Systems  Constitutional: Negative for activity change, fatigue and unexpected weight change.  HENT: Negative for sore throat and trouble swallowing.   Gastrointestinal: Negative for nausea and diarrhea.  Neurological: Negative for dizziness, light-headedness and headaches.       Objective:   Physical Exam  Constitutional: He appears well-developed and well-nourished. No distress.  HENT:  Mouth/Throat: Oropharynx is clear and moist. No oropharyngeal exudate.  Cardiovascular: Normal rate, regular rhythm and normal heart sounds.  Exam reveals no gallop and no friction rub.   No murmur heard. Pulmonary/Chest: Effort normal and breath sounds normal. No respiratory distress. He has no wheezes.  Lymphadenopathy:    He has no cervical adenopathy.          Assessment & Plan:

## 2012-11-10 NOTE — Assessment & Plan Note (Signed)
He is doing well, He should continue taking his Atripla as he is with taking it early on switch days so that only 2 times per month is it off for a day.  I will though have hime return in about 4 months to make sure no side effects related to kidney or liver with labs.

## 2012-11-11 NOTE — Addendum Note (Signed)
Addended by: Andree Coss on: 11/11/2012 02:38 PM   Modules accepted: Orders

## 2013-03-02 ENCOUNTER — Other Ambulatory Visit: Payer: BC Managed Care – PPO

## 2013-03-16 ENCOUNTER — Ambulatory Visit: Payer: BC Managed Care – PPO | Admitting: Internal Medicine

## 2013-04-01 ENCOUNTER — Other Ambulatory Visit: Payer: Self-pay | Admitting: *Deleted

## 2013-04-01 ENCOUNTER — Other Ambulatory Visit: Payer: Self-pay | Admitting: Licensed Clinical Social Worker

## 2013-04-01 MED ORDER — APIXABAN 5 MG PO TABS: 5.0000 mg | ORAL_TABLET | Freq: Two times a day (BID) | ORAL | Status: DC

## 2013-04-01 MED ORDER — DILTIAZEM HCL ER COATED BEADS 240 MG PO CP24: 240.0000 mg | ORAL_CAPSULE | Freq: Every day | ORAL | Status: DC

## 2013-04-27 ENCOUNTER — Other Ambulatory Visit: Payer: Self-pay | Admitting: Internal Medicine

## 2013-05-04 ENCOUNTER — Other Ambulatory Visit: Payer: Self-pay | Admitting: *Deleted

## 2013-05-04 DIAGNOSIS — B20 Human immunodeficiency virus [HIV] disease: Secondary | ICD-10-CM

## 2013-05-04 MED ORDER — EFAVIRENZ-EMTRICITAB-TENOFOVIR 600-200-300 MG PO TABS: 1.0000 | ORAL_TABLET | Freq: Every day | ORAL | Status: DC

## 2013-05-07 ENCOUNTER — Other Ambulatory Visit: Payer: 59

## 2013-05-12 ENCOUNTER — Other Ambulatory Visit (INDEPENDENT_AMBULATORY_CARE_PROVIDER_SITE_OTHER): Payer: 59

## 2013-05-12 ENCOUNTER — Ambulatory Visit: Payer: 59

## 2013-05-12 DIAGNOSIS — Z23 Encounter for immunization: Secondary | ICD-10-CM

## 2013-05-12 DIAGNOSIS — B20 Human immunodeficiency virus [HIV] disease: Secondary | ICD-10-CM

## 2013-05-12 LAB — CBC WITH DIFFERENTIAL/PLATELET
Basophils Absolute: 0.1 10*3/uL (ref 0.0–0.1)
Basophils Relative: 1 % (ref 0–1)
Eosinophils Absolute: 0.2 10*3/uL (ref 0.0–0.7)
Eosinophils Relative: 4 % (ref 0–5)
HCT: 43.7 % (ref 39.0–52.0)
Hemoglobin: 14.8 g/dL (ref 13.0–17.0)
Lymphocytes Relative: 35 % (ref 12–46)
Lymphs Abs: 1.8 10*3/uL (ref 0.7–4.0)
MCH: 30 pg (ref 26.0–34.0)
MCHC: 33.9 g/dL (ref 30.0–36.0)
MCV: 88.6 fL (ref 78.0–100.0)
Monocytes Absolute: 0.6 10*3/uL (ref 0.1–1.0)
Monocytes Relative: 12 % (ref 3–12)
Neutro Abs: 2.4 10*3/uL (ref 1.7–7.7)
Neutrophils Relative %: 48 % (ref 43–77)
Platelets: 174 10*3/uL (ref 150–400)
RBC: 4.93 MIL/uL (ref 4.22–5.81)
RDW: 13.6 % (ref 11.5–15.5)
WBC: 5.1 10*3/uL (ref 4.0–10.5)

## 2013-05-12 LAB — COMPLETE METABOLIC PANEL WITHOUT GFR
ALT: 12 U/L (ref 0–53)
AST: 18 U/L (ref 0–37)
Albumin: 4 g/dL (ref 3.5–5.2)
Alkaline Phosphatase: 72 U/L (ref 39–117)
BUN: 13 mg/dL (ref 6–23)
CO2: 29 meq/L (ref 19–32)
Calcium: 9 mg/dL (ref 8.4–10.5)
Chloride: 105 meq/L (ref 96–112)
Creat: 0.8 mg/dL (ref 0.50–1.35)
GFR, Est African American: >89 mL/min
GFR, Est Non African American: >89 mL/min
Glucose, Bld: 93 mg/dL (ref 70–99)
Potassium: 4.3 meq/L (ref 3.5–5.3)
Sodium: 138 meq/L (ref 135–145)
Total Bilirubin: 0.4 mg/dL (ref 0.3–1.2)
Total Protein: 7.2 g/dL (ref 6.0–8.3)

## 2013-05-13 LAB — HIV-1 RNA QUANT-NO REFLEX-BLD
HIV 1 RNA Quant: <20 copies/mL (ref <20)
HIV-1 RNA Quant, Log: <1.3 {Log} (ref <1.30)

## 2013-05-13 LAB — T-HELPER CELL (CD4) - (RCID CLINIC ONLY)
CD4 % Helper T Cell: 33 % (ref 33–55)
CD4 T Cell Abs: 540 /uL (ref 400–2700)

## 2013-05-26 ENCOUNTER — Ambulatory Visit (INDEPENDENT_AMBULATORY_CARE_PROVIDER_SITE_OTHER): Payer: 59 | Admitting: Internal Medicine

## 2013-05-26 ENCOUNTER — Encounter: Payer: Self-pay | Admitting: Internal Medicine

## 2013-05-26 VITALS — BP 134/84 | HR 71 | Temp 97.7°F | Ht 73.0 in | Wt 225.0 lb

## 2013-05-26 DIAGNOSIS — Z79899 Other long term (current) drug therapy: Secondary | ICD-10-CM

## 2013-05-26 DIAGNOSIS — Z113 Encounter for screening for infections with a predominantly sexual mode of transmission: Secondary | ICD-10-CM

## 2013-05-26 DIAGNOSIS — B20 Human immunodeficiency virus [HIV] disease: Secondary | ICD-10-CM

## 2013-05-26 MED ORDER — EFAVIRENZ-EMTRICITAB-TENOFOVIR 600-200-300 MG PO TABS: 1.0000 | ORAL_TABLET | Freq: Every day | ORAL | Status: DC

## 2013-05-26 NOTE — Assessment & Plan Note (Signed)
He is doing well on Atripla and good compliance. Labs are good with a normal CD4 count and undetectable viral load still. He will return in 6 months but knows to call if he has problems prior to that. I did discuss with him changing to Stribild do to the CNS effects but he is pleased with Atripla and will continue.

## 2013-05-26 NOTE — Progress Notes (Signed)
   Subjective:    Patient ID: Ethan Lambert, male    DOB: 22-Sep-1960, 52 y.o.   MRN: [id number]  HPI Reason for followup of his HIV. He continues on Atripla and denies any problems with it he endorses to miss doses since his last visit 7 months ago. 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 No new issues. No weight loss, no diarrhea or rashes. He does have some dizziness from the medication but nothing significant and no problems otherwise.   Review of Systems  Constitutional: Negative for fever.  Gastrointestinal: Negative for nausea and diarrhea.  Musculoskeletal: Negative for joint swelling.  Skin: Negative for rash.  Neurological: Positive for light-headedness.  Psychiatric/Behavioral: Negative for sleep disturbance.       Objective:   Physical Exam  Constitutional: He appears well-developed and well-nourished. No distress.  HENT:  Mouth/Throat: No oropharyngeal exudate.  Eyes: No scleral icterus.  Cardiovascular: Normal rate, regular rhythm and normal heart sounds.   No murmur heard. Pulmonary/Chest: Effort normal and breath sounds normal. He has no wheezes.  Lymphadenopathy:    He has no cervical adenopathy.  Skin: No rash noted.  Psychiatric: He has a normal mood and affect. His behavior is normal.          Assessment & Plan:

## 2013-11-09 ENCOUNTER — Other Ambulatory Visit: Payer: Self-pay | Admitting: Internal Medicine

## 2013-11-09 ENCOUNTER — Other Ambulatory Visit: Payer: Self-pay | Admitting: *Deleted

## 2013-11-09 DIAGNOSIS — B20 Human immunodeficiency virus [HIV] disease: Secondary | ICD-10-CM

## 2013-11-09 MED ORDER — EFAVIRENZ-EMTRICITAB-TENOFOVIR 600-200-300 MG PO TABS: 1.0000 | ORAL_TABLET | Freq: Every day | ORAL | Status: DC

## 2013-11-10 ENCOUNTER — Telehealth: Payer: Self-pay | Admitting: Internal Medicine

## 2013-11-10 NOTE — Telephone Encounter (Signed)
Walk in pt Form " [facility]" Clearance paper Dropped Off gave to [facility]  5.20.15/kdm

## 2013-12-03 ENCOUNTER — Encounter: Payer: Self-pay | Admitting: Internal Medicine

## 2013-12-22 ENCOUNTER — Ambulatory Visit (INDEPENDENT_AMBULATORY_CARE_PROVIDER_SITE_OTHER): Payer: 59 | Admitting: Nurse Practitioner

## 2013-12-22 ENCOUNTER — Telehealth: Payer: Self-pay | Admitting: Internal Medicine

## 2013-12-22 ENCOUNTER — Encounter: Payer: Self-pay | Admitting: Nurse Practitioner

## 2013-12-22 VITALS — BP 132/92 | HR 46 | Ht 73.0 in | Wt 245.8 lb

## 2013-12-22 DIAGNOSIS — I4891 Unspecified atrial fibrillation: Secondary | ICD-10-CM

## 2013-12-22 DIAGNOSIS — Z01810 Encounter for preprocedural cardiovascular examination: Secondary | ICD-10-CM

## 2013-12-22 DIAGNOSIS — I480 Paroxysmal atrial fibrillation: Secondary | ICD-10-CM

## 2013-12-22 LAB — BASIC METABOLIC PANEL
BUN: 13 mg/dL (ref 6–23)
CO2: 25 mEq/L (ref 19–32)
Calcium: 9.2 mg/dL (ref 8.4–10.5)
Chloride: 108 mEq/L (ref 96–112)
Creatinine, Ser: 0.9 mg/dL (ref 0.4–1.5)
GFR: 96.29 mL/min (ref >60.00)
Glucose, Bld: 101 mg/dL — ABNORMAL HIGH (ref 70–99)
Potassium: 4.2 mEq/L (ref 3.5–5.1)
Sodium: 139 mEq/L (ref 135–145)

## 2013-12-22 LAB — CBC
HCT: 44.1 % (ref 39.0–52.0)
Hemoglobin: 15 g/dL (ref 13.0–17.0)
MCHC: 34 g/dL (ref 30.0–36.0)
MCV: 88.8 fl (ref 78.0–100.0)
Platelets: 235 10*3/uL (ref 150.0–400.0)
RBC: 4.96 Mil/uL (ref 4.22–5.81)
RDW: 13.9 % (ref 11.5–15.5)
WBC: 6.2 10*3/uL (ref 4.0–10.5)

## 2013-12-22 NOTE — Patient Instructions (Signed)
We will check labs today  Stay on your current medicines but take the Eliquis 2 times a day  Call us if you have any dizzy or lightheaded spells  See Dr. Rayann Heman in one year  You may have your knee surgery - you are to hold your Eliquis just 24 hours prior to surgery and restart immediately after surgery  Call the [place] office at [phone] if you have any questions, problems or concerns.

## 2013-12-22 NOTE — Telephone Encounter (Signed)
I will be sending my note to Dr. Theda Sers.

## 2013-12-22 NOTE — Progress Notes (Addendum)
Ethan Lambert Date of Birth: 08-08-1960 Medical Record #[id number]  History of Present Illness: Mr. Ethan Lambert is seen back today for a pre op clearance visit. Seen for Dr. Rayann Heman. He has a history of prior CVA, HTN and symptomatic PAF. He has Flecainide as a "pill in the pocket". He was previously on coumadin after a stroke in 1999. Now on Eliquis. He has a CHADS2 of 3 and a CHADSVASC of 3.   Seen back in 2013 - extensive discussion about his anticoagulation and options at that time - patient was applying for a job with [employer]. Dr. Rayann Heman recommended Eliquis.   Comes back today. Here alone. 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 He is doing well. No chest pain. Not short of breath. No palpitations. Has not used any Flecainide in over a year. Does not check BP at home. Has a torn meniscus and needing surgery. He does not remember who the surgeon is to be. He is taking his medicines. Ran out of his Diltiazem and did not take last night. Not dizzy or lightheaded. Only taking his Eliquis over the last month just ONE time a day. He does not know why he is doing this.   Current Outpatient Prescriptions  Medication Sig Dispense Refill  . diltiazem (CARDIZEM CD) 240 MG 24 hr capsule Take 1 capsule (240 mg total) by mouth daily.  90 capsule  3  . efavirenz-emtricitabine-tenofovir (ATRIPLA) 600-200-300 MG per tablet Take 1 tablet by mouth at bedtime.  90 tablet  2  . ELIQUIS 5 MG TABS tablet TAKE 1 TABLET TWICE A DAY  180 tablet  2  . FLECAINIDE ACETATE PO Take by mouth. Takes two pills prn atrial fibrillation      . ibuprofen (ADVIL,MOTRIN) 800 MG tablet Take 1 tablet (800 mg total) by mouth 3 (three) times daily.  21 tablet  0   No current facility-administered medications for this visit.    Allergies  Allergen Reactions  . Morphine     hallucinations    Past Medical History  Diagnosis Date  . Paroxysmal atrial fibrillation   . Hypertension   . CVA (cerebral infarction)  1999  . HIV disease dx 1999  . Skin cancer     Non-melanomatous  . Family history of malignant neoplasm of gastrointestinal tract     Past Surgical History  Procedure Laterality Date  . Appendectomy    . Cholecystectomy    . Hernia repair      lower right and left    History  Smoking status  . Never Smoker   Smokeless tobacco  . Never Used    History  Alcohol Use  . Yes    Comment: rare    Family History  Problem Relation Age of Onset  . Arrhythmia Father   . Heart attack Brother   . Prostate cancer Maternal Grandfather   . Colon cancer Maternal Grandfather   . Lung cancer Mother     smoker    Review of Systems: The review of systems is per the HPI.  All other systems were reviewed and are negative.  Physical Exam: BP 132/92  Pulse 46  Ht 6\' 1"  (1.854 m)  Wt 245 lb 12.8 oz (111.494 kg)  BMI 32.44 kg/m2 Patient is very pleasant and in no acute distress. Skin is warm and dry. Color is normal.  HEENT is unremarkable. Normocephalic/atraumatic. PERRL. Sclera are nonicteric. Neck is supple. No masses. No JVD. Lungs are clear. Cardiac  exam shows a regular rate and rhythm. HR 52 by me. Abdomen is soft. Extremities are without edema. Gait and ROM are intact. No gross neurologic deficits noted.  Wt Readings from Last 3 Encounters:  12/22/13 245 lb 12.8 oz (111.494 kg)  05/26/13 225 lb (102.059 kg)  11/10/12 210 lb (95.255 kg)    LABORATORY DATA/PROCEDURES: EKG today shows sinus brady rate of 46. Reviewed with Dr. Rayann Heman.  Lab Results  Component Value Date   WBC 5.1 05/12/2013   HGB 14.8 05/12/2013   HCT 43.7 05/12/2013   PLT 174 05/12/2013   GLUCOSE 93 05/12/2013   CHOL 178 04/16/2012   TRIG 99 04/16/2012   HDL 30* 04/16/2012   LDLCALC 128* 04/16/2012   ALT 12 05/12/2013   AST 18 05/12/2013   NA 138 05/12/2013   K 4.3 05/12/2013   CL 105 05/12/2013   CREATININE 0.80 05/12/2013   BUN 13 05/12/2013   CO2 29 05/12/2013   TSH 3.035 Test methodology is 3rd  generation TSH 05/15/2009   PSA 1.68 06/27/2009   INR 1.4 05/29/2012    BNP (last 3 results) No results found for this basename: PROBNP,  in the last 8760 hours  Echo Study Conclusions from 2010  1. Left ventricle: The cavity size was normal. Wall thickness was increased in a pattern of moderate LVH. Systolic function was normal. The estimated ejection fraction was in the range of 55% to 60%. 2. Mitral valve: Mild regurgitation. 3. Impressions: Bubble study negative for right to left shunt Impressions:  - Bubble study negative for right to left shunt   Assessment / Plan:  1. PAF - remains in sinus. No problems noted. Has not used Flecainide in over a year. He is bradycardic today but totally asymptomatic. See back in one year.   2. Pre op clearance -  Should be an acceptable candidate for knee surgery. Would hold Eliquis only 24 hours prior and restart immediately after his surgery in light of prior stroke. I have asked him to call back and let us know who will be doing his surgery. This plan has been reviewed with Dr. Rayann Heman and he is in agreement.   3. Chronic anticoagulation - not taking as directed - I have reiterated the need to take BID to be adequately protected.   4. Bradycardia - asymptomatic.   Needs baseline labs today. See back in a year.   Patient is agreeable to this plan and will call if any problems develop in the interim.   Burtis Junes, RN, Ethan Lambert [address] [place], [place]  [phone]  Addendum: Patient has called and reported that Dr. Theda Sers is his surgeon. Will fax note to Dr. Theda Sers.

## 2013-12-22 NOTE — Telephone Encounter (Signed)
New message    Calling back with additional information    Dr.  Hart Robinsons @ [facility]

## 2013-12-29 ENCOUNTER — Encounter (HOSPITAL_BASED_OUTPATIENT_CLINIC_OR_DEPARTMENT_OTHER): Payer: Self-pay | Admitting: *Deleted

## 2013-12-29 ENCOUNTER — Other Ambulatory Visit: Payer: Self-pay | Admitting: Orthopedic Surgery

## 2013-12-30 ENCOUNTER — Encounter (HOSPITAL_BASED_OUTPATIENT_CLINIC_OR_DEPARTMENT_OTHER): Payer: Self-pay | Admitting: *Deleted

## 2013-12-30 NOTE — Progress Notes (Signed)
NPO AFTER MN WITH EXCEPTION CLEAR LIQUIDS UNTIL 0900 (NO CREAM/ MILK PRODUCTS).  ARRIVE AT 1315. CURRENT LAB RESULTS AND EKG IN CHART AND EPIC. WILL TAKE CARDIZEM AM DOS W/ SIPS OF WATER , PT WAITING FOR IT TO ARRIVE IN MAIL.

## 2013-12-31 ENCOUNTER — Encounter (HOSPITAL_BASED_OUTPATIENT_CLINIC_OR_DEPARTMENT_OTHER)
Admission: RE | Disposition: A | Discharge status: Home / Self Care | Payer: Self-pay | Source: Ambulatory Visit | Attending: Specialist

## 2013-12-31 ENCOUNTER — Ambulatory Visit (HOSPITAL_BASED_OUTPATIENT_CLINIC_OR_DEPARTMENT_OTHER): Payer: Worker's Compensation | Admitting: Anesthesiology

## 2013-12-31 ENCOUNTER — Ambulatory Visit (HOSPITAL_BASED_OUTPATIENT_CLINIC_OR_DEPARTMENT_OTHER)
Admission: RE | Admit: 2013-12-31 | Discharge: 2013-12-31 | Disposition: A | Discharge status: Home / Self Care | Payer: Worker's Compensation | Source: Ambulatory Visit | Attending: Specialist | Admitting: Specialist

## 2013-12-31 ENCOUNTER — Encounter (HOSPITAL_BASED_OUTPATIENT_CLINIC_OR_DEPARTMENT_OTHER): Payer: Worker's Compensation | Admitting: Anesthesiology

## 2013-12-31 ENCOUNTER — Encounter (HOSPITAL_BASED_OUTPATIENT_CLINIC_OR_DEPARTMENT_OTHER): Payer: Self-pay

## 2013-12-31 DIAGNOSIS — Z21 Asymptomatic human immunodeficiency virus [HIV] infection status: Secondary | ICD-10-CM | POA: Insufficient documentation

## 2013-12-31 DIAGNOSIS — Z885 Allergy status to narcotic agent status: Secondary | ICD-10-CM | POA: Insufficient documentation

## 2013-12-31 DIAGNOSIS — I10 Essential (primary) hypertension: Secondary | ICD-10-CM | POA: Insufficient documentation

## 2013-12-31 DIAGNOSIS — Z85828 Personal history of other malignant neoplasm of skin: Secondary | ICD-10-CM | POA: Insufficient documentation

## 2013-12-31 DIAGNOSIS — M23305 Other meniscus derangements, unspecified medial meniscus, unspecified knee: Secondary | ICD-10-CM | POA: Insufficient documentation

## 2013-12-31 DIAGNOSIS — I4891 Unspecified atrial fibrillation: Secondary | ICD-10-CM | POA: Insufficient documentation

## 2013-12-31 DIAGNOSIS — Z8673 Personal history of transient ischemic attack (TIA), and cerebral infarction without residual deficits: Secondary | ICD-10-CM | POA: Insufficient documentation

## 2013-12-31 DIAGNOSIS — Z9889 Other specified postprocedural states: Secondary | ICD-10-CM

## 2013-12-31 HISTORY — DX: Unspecified tear of unspecified meniscus, current injury, right knee, initial encounter: S83.206A

## 2013-12-31 HISTORY — DX: Personal history of urinary calculi: Z87.442

## 2013-12-31 HISTORY — PX: KNEE ARTHROSCOPY WITH MEDIAL MENISECTOMY: SHX5651

## 2013-12-31 SURGERY — ARTHROSCOPY, KNEE, WITH MEDIAL MENISCECTOMY
Anesthesia: General | Site: Knee | Laterality: Right

## 2013-12-31 MED ORDER — SODIUM CHLORIDE 0.9 % IR SOLN
Status: DC | PRN
  Administered 2013-12-31: 9000 mL

## 2013-12-31 MED ORDER — PROMETHAZINE HCL 25 MG/ML IJ SOLN
6.2500 mg | INTRAMUSCULAR | Status: DC | PRN
  Filled 2013-12-31: qty 1

## 2013-12-31 MED ORDER — ONDANSETRON 4 MG PO TBDP: 4.0000 mg | ORAL_TABLET | Freq: Three times a day (TID) | ORAL | Status: DC | PRN

## 2013-12-31 MED ORDER — CEFAZOLIN SODIUM-DEXTROSE 2-3 GM-% IV SOLR
2.0000 g | INTRAVENOUS | Status: AC
  Administered 2013-12-31: 2 g via INTRAVENOUS
  Filled 2013-12-31: qty 50

## 2013-12-31 MED ORDER — FENTANYL CITRATE 0.05 MG/ML IJ SOLN
INTRAMUSCULAR | Status: AC
  Filled 2013-12-31: qty 4

## 2013-12-31 MED ORDER — ACETAMINOPHEN 10 MG/ML IV SOLN
INTRAVENOUS | Status: DC | PRN
  Administered 2013-12-31: 1000 mg via INTRAVENOUS

## 2013-12-31 MED ORDER — MIDAZOLAM HCL 5 MG/5ML IJ SOLN
INTRAMUSCULAR | Status: DC | PRN
  Administered 2013-12-31 (×2): 1 mg via INTRAVENOUS

## 2013-12-31 MED ORDER — LIDOCAINE HCL (CARDIAC) 20 MG/ML IV SOLN
INTRAVENOUS | Status: DC | PRN
  Administered 2013-12-31: 100 mg via INTRAVENOUS

## 2013-12-31 MED ORDER — ONDANSETRON HCL 4 MG/2ML IJ SOLN
INTRAMUSCULAR | Status: DC | PRN
  Administered 2013-12-31: 4 mg via INTRAVENOUS

## 2013-12-31 MED ORDER — FENTANYL CITRATE 0.05 MG/ML IJ SOLN
INTRAMUSCULAR | Status: DC | PRN
  Administered 2013-12-31: 50 ug via INTRAVENOUS
  Administered 2013-12-31 (×2): 25 ug via INTRAVENOUS

## 2013-12-31 MED ORDER — POVIDONE-IODINE 7.5 % EX SOLN
Freq: Once | CUTANEOUS | Status: DC
  Filled 2013-12-31: qty 118

## 2013-12-31 MED ORDER — DEXAMETHASONE SODIUM PHOSPHATE 4 MG/ML IJ SOLN
INTRAMUSCULAR | Status: DC | PRN
  Administered 2013-12-31: 10 mg via INTRAVENOUS

## 2013-12-31 MED ORDER — BUPIVACAINE HCL 0.25 % IJ SOLN
INTRAMUSCULAR | Status: DC | PRN
  Administered 2013-12-31: 10 mL

## 2013-12-31 MED ORDER — OXYCODONE HCL 5 MG PO TABS
ORAL_TABLET | ORAL | Status: AC
  Filled 2013-12-31: qty 1

## 2013-12-31 MED ORDER — MEPERIDINE HCL 25 MG/ML IJ SOLN
6.2500 mg | INTRAMUSCULAR | Status: DC | PRN
  Filled 2013-12-31: qty 1

## 2013-12-31 MED ORDER — CEFAZOLIN SODIUM-DEXTROSE 2-3 GM-% IV SOLR
INTRAVENOUS | Status: AC
  Filled 2013-12-31: qty 50

## 2013-12-31 MED ORDER — HYDROMORPHONE HCL PF 1 MG/ML IJ SOLN
0.2500 mg | INTRAMUSCULAR | Status: DC | PRN
  Filled 2013-12-31: qty 1

## 2013-12-31 MED ORDER — OXYCODONE HCL 5 MG/5ML PO SOLN
5.0000 mg | Freq: Once | ORAL | Status: AC | PRN
  Filled 2013-12-31: qty 5

## 2013-12-31 MED ORDER — CEPHALEXIN 500 MG PO CAPS: 500.0000 mg | ORAL_CAPSULE | Freq: Three times a day (TID) | ORAL | Status: DC

## 2013-12-31 MED ORDER — KETOROLAC TROMETHAMINE 30 MG/ML IJ SOLN
INTRAMUSCULAR | Status: DC | PRN
  Administered 2013-12-31: 30 mg via INTRAVENOUS

## 2013-12-31 MED ORDER — LACTATED RINGERS IV SOLN
INTRAVENOUS | Status: DC
  Administered 2013-12-31 (×2): via INTRAVENOUS
  Filled 2013-12-31: qty 1000

## 2013-12-31 MED ORDER — OXYCODONE HCL 5 MG PO TABS
5.0000 mg | ORAL_TABLET | Freq: Once | ORAL | Status: AC | PRN
Start: 2013-12-31 — End: 2013-12-31
  Administered 2013-12-31: 5 mg via ORAL
  Filled 2013-12-31: qty 1

## 2013-12-31 MED ORDER — MIDAZOLAM HCL 2 MG/2ML IJ SOLN
INTRAMUSCULAR | Status: AC
  Filled 2013-12-31: qty 2

## 2013-12-31 MED ORDER — PROPOFOL 10 MG/ML IV BOLUS
INTRAVENOUS | Status: DC | PRN
  Administered 2013-12-31: 300 mg via INTRAVENOUS
  Administered 2013-12-31: 30 mg via INTRAVENOUS

## 2013-12-31 MED ORDER — HYDROCODONE-ACETAMINOPHEN 5-325 MG PO TABS: 1.0000 | ORAL_TABLET | Freq: Four times a day (QID) | ORAL | Status: DC | PRN

## 2013-12-31 SURGICAL SUPPLY — 53 items
BANDAGE ESMARK 6X9 LF (GAUZE/BANDAGES/DRESSINGS) ×1 IMPLANT
BLADE 4.2CUDA (BLADE) IMPLANT
BLADE CUDA 4.2 (BLADE) IMPLANT
BLADE CUDA GRT WHITE 3.5 (BLADE) ×2 IMPLANT
BLADE CUDA SHAVER 3.5 (BLADE) IMPLANT
BNDG CMPR 9X6 STRL LF SNTH (GAUZE/BANDAGES/DRESSINGS) ×1
BNDG ESMARK 6X9 LF (GAUZE/BANDAGES/DRESSINGS) ×2
BNDG GAUZE ELAST 4 BULKY (GAUZE/BANDAGES/DRESSINGS) ×3 IMPLANT
CANISTER SUCT LVC 12 LTR MEDI- (MISCELLANEOUS) ×4 IMPLANT
CANISTER SUCTION 1200CC (MISCELLANEOUS) ×1 IMPLANT
CLOTH BEACON ORANGE TIMEOUT ST (SAFETY) ×1 IMPLANT
CUFF TOURNIQUET SINGLE 34IN LL (TOURNIQUET CUFF) ×2 IMPLANT
DRAPE ARTHROSCOPY W/POUCH 114 (DRAPES) ×2 IMPLANT
DRAPE INCISE 23X17 IOBAN STRL (DRAPES) ×1
DRAPE INCISE 23X17 STRL (DRAPES) ×1 IMPLANT
DRAPE INCISE IOBAN 23X17 STRL (DRAPES) ×1 IMPLANT
DRAPE INCISE IOBAN 66X45 STRL (DRAPES) ×2 IMPLANT
DURAPREP 26ML APPLICATOR (WOUND CARE) ×2 IMPLANT
ELECT MENISCUS 165MM 90D (ELECTRODE) IMPLANT
ELECT REM PT RETURN 9FT ADLT (ELECTROSURGICAL)
ELECTRODE REM PT RTRN 9FT ADLT (ELECTROSURGICAL) IMPLANT
GAUZE XEROFORM 1X8 LF (GAUZE/BANDAGES/DRESSINGS) ×2 IMPLANT
GLOVE BIO SURGEON STRL SZ7.5 (GLOVE) ×2 IMPLANT
GLOVE BIOGEL PI IND STRL 7.5 (GLOVE) IMPLANT
GLOVE BIOGEL PI INDICATOR 7.5 (GLOVE) ×1
GLOVE INDICATOR 8.0 STRL GRN (GLOVE) ×4 IMPLANT
GLOVE SURG ORTHO 8.0 STRL STRW (GLOVE) ×2 IMPLANT
GLOVE SURG SS PI 7.5 STRL IVOR (GLOVE) ×2 IMPLANT
GOWN PREVENTION PLUS LG XLONG (DISPOSABLE) ×1 IMPLANT
GOWN STRL REIN XL XLG (GOWN DISPOSABLE) ×1 IMPLANT
GOWN STRL REUS W/TWL XL LVL3 (GOWN DISPOSABLE) ×3 IMPLANT
IMMOBILIZER KNEE 22 UNIV (SOFTGOODS) IMPLANT
IMMOBILIZER KNEE 24 THIGH 36 (MISCELLANEOUS) IMPLANT
IMMOBILIZER KNEE 24 UNIV (MISCELLANEOUS)
KNEE WRAP E Z 3 GEL PACK (MISCELLANEOUS) ×2 IMPLANT
MINI VAC (SURGICAL WAND) ×2 IMPLANT
NEEDLE HYPO 22GX1.5 SAFETY (NEEDLE) ×2 IMPLANT
PACK ARTHROSCOPY DSU (CUSTOM PROCEDURE TRAY) ×2 IMPLANT
PACK BASIN DAY SURGERY FS (CUSTOM PROCEDURE TRAY) ×2 IMPLANT
PAD ABD 8X10 STRL (GAUZE/BANDAGES/DRESSINGS) ×4 IMPLANT
PADDING CAST ABS 4INX4YD NS (CAST SUPPLIES) ×1
PADDING CAST ABS COTTON 4X4 ST (CAST SUPPLIES) ×1 IMPLANT
PENCIL BUTTON HOLSTER BLD 10FT (ELECTRODE) IMPLANT
SET ARTHROSCOPY TUBING (MISCELLANEOUS) ×2
SET ARTHROSCOPY TUBING LN (MISCELLANEOUS) ×1 IMPLANT
SPONGE GAUZE 4X4 12PLY (GAUZE/BANDAGES/DRESSINGS) ×2 IMPLANT
SUT ETHILON 4 0 PS 2 18 (SUTURE) ×2 IMPLANT
SYR CONTROL 10ML LL (SYRINGE) ×2 IMPLANT
TOWEL OR 17X24 6PK STRL BLUE (TOWEL DISPOSABLE) ×2 IMPLANT
TUBE CONNECTING 12X1/4 (SUCTIONS) ×2 IMPLANT
WAND 30 DEG SABER W/CORD (SURGICAL WAND) IMPLANT
WAND 90 DEG TURBOVAC W/CORD (SURGICAL WAND) IMPLANT
WATER STERILE IRR 500ML POUR (IV SOLUTION) ×2 IMPLANT

## 2013-12-31 NOTE — Anesthesia Postprocedure Evaluation (Signed)
Anesthesia Post Note  Patient: Ethan Lambert  Procedure(s) Performed: Procedure(s) (LRB): RIGHT KNEE ARTHROSCOPY PARTIAL MEDIAL MENISCECTOMY DEBRIDEMENT AND CHONDROPLASTY  (Right)  Anesthesia type: General  Patient location: PACU  Post pain: Pain level controlled  Post assessment: Post-op Vital signs reviewed  Last Vitals:  Filed Vitals:   12/31/13 1700  BP: 134/99  Pulse: 71  Temp:   Resp: 14    Post vital signs: Reviewed  Level of consciousness: sedated  Complications: No apparent anesthesia complications

## 2013-12-31 NOTE — Interval H&P Note (Signed)
History and Physical Interval Note:  12/31/2013 1:49 PM  Ethan Lambert  has presented today for surgery, with the diagnosis of RIGHT KNEE TORN MEDICAL MENISCUS AND OA   The various methods of treatment have been discussed with the patient and family. After consideration of risks, benefits and other options for treatment, the patient has consented to  Procedure(s): RIGHT KNEE ARTHROSCOPY PARTIAL MEDIAL MENISCECTOMY AND CHONDROPLASTY  (Right) as a surgical intervention .  The patient's history has been reviewed, patient examined, no change in status, stable for surgery.  I have reviewed the patient's chart and labs.  Questions were answered to the patient's satisfaction.     Sebastyan Snodgrass ANDREW

## 2013-12-31 NOTE — Op Note (Signed)
Dictated# [id number]

## 2013-12-31 NOTE — Anesthesia Procedure Notes (Signed)
Procedure Name: LMA Insertion Date/Time: 12/31/2013 3:39 PM Performed by: Justice Rocher Pre-anesthesia Checklist: Patient identified, Emergency Drugs available, Suction available and Patient being monitored Patient Re-evaluated:Patient Re-evaluated prior to inductionOxygen Delivery Method: Circle System Utilized Preoxygenation: Pre-oxygenation with 100% oxygen Intubation Type: IV induction Ventilation: Mask ventilation without difficulty LMA: LMA inserted LMA Size: 5.0 Number of attempts: 1 Airway Equipment and Method: bite block Placement Confirmation: positive ETCO2 Tube secured with: Tape Dental Injury: Teeth and Oropharynx as per pre-operative assessment

## 2013-12-31 NOTE — Anesthesia Preprocedure Evaluation (Addendum)
Anesthesia Evaluation  Patient identified by MRN, date of birth, ID band Patient awake    Reviewed: Allergy & Precautions, H&P , NPO status , Patient's Chart, lab work & pertinent test results  Airway Mallampati: II TM Distance: >3 FB Neck ROM: Full    Dental  (+) Dental Advisory Given   Pulmonary neg pulmonary ROS,          Cardiovascular hypertension, Pt. on medications + Peripheral Vascular Disease + dysrhythmias Atrial Fibrillation Rhythm:Regular Rate:Normal     Neuro/Psych CVA, No Residual Symptoms negative psych ROS   GI/Hepatic negative GI ROS, Neg liver ROS,   Endo/Other  negative endocrine ROS  Renal/GU negative Renal ROS     Musculoskeletal negative musculoskeletal ROS (+)   Abdominal   Peds  Hematology  (+) HIV,   Anesthesia Other Findings   Reproductive/Obstetrics negative OB ROS                         Anesthesia Physical Anesthesia Plan  ASA: III  Anesthesia Plan: General   Post-op Pain Management:    Induction: Intravenous  Airway Management Planned: LMA  Additional Equipment:   Intra-op Plan:   Post-operative Plan: Extubation in OR  Informed Consent: I have reviewed the patients History and Physical, chart, labs and discussed the procedure including the risks, benefits and alternatives for the proposed anesthesia with the patient or authorized representative who has indicated his/her understanding and acceptance.   Dental advisory given  Plan Discussed with: CRNA and Surgeon  Anesthesia Plan Comments:        Anesthesia Quick Evaluation

## 2013-12-31 NOTE — Transfer of Care (Signed)
Immediate Anesthesia Transfer of Care Note  Patient: Ethan Lambert  Procedure(s) Performed: Procedure(s) (LRB): RIGHT KNEE ARTHROSCOPY PARTIAL MEDIAL MENISCECTOMY DEBRIDEMENT AND CHONDROPLASTY  (Right)  Patient Location: PACU  Anesthesia Type: General  Level of Consciousness: awake, alert  and oriented  Airway & Oxygen Therapy: Patient Spontanous Breathing and Patient connected to face mask oxygen  Post-op Assessment: Report given to PACU RN and Post -op Vital signs reviewed and stable  Post vital signs: Reviewed and stable  Complications: No apparent anesthesia complications

## 2013-12-31 NOTE — H&P (Signed)
Ethan Lambert is an 53 y.o. male.   Chief Complaint: Right knee pain HPI: Patient presents with joint discomfort that had been persistent for several months now. Despite conservative treatments, his discomfort has not improved. Imaging was obtained. Other conservative and surgical treatments were discussed in detail. Patient wishes to proceed with surgery as consented. Denies SOB, CP, or calf pain. No Fever, chills, or nausea/ vomiting.   Past Medical History  Diagnosis Date  . Paroxysmal atrial fibrillation     CARDIOLOGIST--  DR Rayann Heman  . Hypertension   . HIV disease dx 18    MONITORED BY INFECTIOUS DISEASE -- DR Herbie Baltimore COMER  . Family history of malignant neoplasm of gastrointestinal tract   . History of CVA (cerebrovascular accident) no residuals    1999--  LEFT FRONTAL & OCCIPITAL , NON-HEMORRHAGIC  . History of nonmelanoma skin cancer   . History of kidney stones   . Right knee meniscal tear     Past Surgical History  Procedure Laterality Date  . Appendectomy  06-09-2003  . Laparoscopic cholecystectomy  07-08-2003  . Inguinal hernia repair Bilateral 1990's  . Laser ablation anal condyloma  05-18-2001  . Transthoracic echocardiogram  12-01-2008    MODERATE LVH/  EF 55-60%/  MILD MR/  NEGATIVE BUBBLE STUDY  . Negative sleep study  01-03-2012  in epic    Family History  Problem Relation Age of Onset  . Arrhythmia Father   . Heart attack Brother   . Prostate cancer Maternal Grandfather   . Colon cancer Maternal Grandfather   . Lung cancer Mother     smoker   Social History:  reports that he has never smoked. He has never used smokeless tobacco. He reports that he drinks alcohol. He reports that he does not use illicit drugs.  Allergies:  Allergies  Allergen Reactions  . Morphine Other (See Comments)    hallucinations    Medications Prior to Admission  Medication Sig Dispense Refill  . diltiazem (CARDIZEM CD) 240 MG 24 hr capsule Take 1 capsule (240 mg total)  by mouth daily.  90 capsule  3  . efavirenz-emtricitabine-tenofovir (ATRIPLA) 600-200-300 MG per tablet Take 1 tablet by mouth at bedtime.  90 tablet  2  . ELIQUIS 5 MG TABS tablet TAKE 1 TABLET TWICE A DAY  180 tablet  2  . FLECAINIDE ACETATE PO Take by mouth as directed. Takes two pills prn atrial fibrillation      . ibuprofen (ADVIL,MOTRIN) 800 MG tablet Take 800 mg by mouth every 8 (eight) hours as needed.        No results found for this or any previous visit (from the past 48 hour(s)). No results found.  Review of Systems  Constitutional: Negative.   HENT: Negative.   Eyes: Negative.   Respiratory: Negative.   Cardiovascular: Negative.   Gastrointestinal: Negative.   Genitourinary: Negative.   Musculoskeletal: Positive for joint pain.  Skin: Negative.   Neurological: Negative.   Endo/Heme/Allergies: Negative.   Psychiatric/Behavioral: Negative.     Blood pressure 127/86, pulse 69, temperature 98.1 F (36.7 C), temperature source Oral, resp. rate 18, weight 109.09 kg (240 lb 8 oz), SpO2 95.00%. Physical Exam  Constitutional: He is oriented to person, place, and time. He appears well-developed.  HENT:  Head: Normocephalic.  Eyes: EOM are normal.  Neck: Normal range of motion.  Cardiovascular: Normal rate, regular rhythm, normal heart sounds and intact distal pulses.   Respiratory: Effort normal.  GI: Soft.  Genitourinary:  Deferred  Musculoskeletal: He exhibits edema and tenderness.  Right knee. Calf soft and non tender. RLE n/v intact.  Neurological: He is alert and oriented to person, place, and time.  Skin: Skin is warm and dry.  Psychiatric: His behavior is normal.     Assessment/Plan Right Knee MMT and OA: Right knee scope chondroplasty and meniscectomy D/c home today F/u in office Follow instructions  Vonnie Spagnolo L 12/31/2013, 1:40 PM

## 2013-12-31 NOTE — Discharge Instructions (Signed)

## 2013-12-31 NOTE — H&P (View-Only) (Signed)
NPO AFTER MN WITH EXCEPTION CLEAR LIQUIDS UNTIL 0900 (NO CREAM/ MILK PRODUCTS).  ARRIVE AT 1315. CURRENT LAB RESULTS AND EKG IN CHART AND EPIC. WILL TAKE CARDIZEM AM DOS W/ SIPS OF WATER , PT WAITING FOR IT TO ARRIVE IN MAIL.

## 2014-01-03 ENCOUNTER — Encounter (HOSPITAL_BASED_OUTPATIENT_CLINIC_OR_DEPARTMENT_OTHER): Payer: Self-pay | Admitting: Specialist

## 2014-01-03 NOTE — Op Note (Signed)
Ethan Lambert, Ethan Lambert NO.:  [id number]  MEDICAL RECORD NO.:  [id number]  LOCATION:                                 FACILITY:  PHYSICIAN:  Cynda Familia, M.D.DATE OF BIRTH:  13-Jan-1961  DATE OF PROCEDURE:  12/31/2013 DATE OF DISCHARGE:  12/31/2013                              OPERATIVE REPORT   PREOPERATIVE DIAGNOSES:  Right knee torn medial meniscus, chondromalacia.  POSTOPERATIVE DIAGNOSES: 1. Right knee complex tear, medial meniscus. 2. Grade 3 chondromalacia medial femoral condyle, grade 1 __________     lateral tibial plateau.  PROCEDURE: 1. Right knee arthroscopic partial medial meniscectomy. 2. Chondroplasty medial femoral condyle.  SURGEON:  Cynda Familia, MD.  ASSISTANT:  Wyatt Portela, PA-C  ANESTHESIA:  General __________  ESTIMATED BLOOD LOSS:  Minimal.  DRAINS:  None.  COMPLICATIONS:  None.  TOURNIQUET TIME:  30 minutes at 300 mmHg.  DISPOSITION:  To PACU stable.  OPERATIVE DETAILS:  The patient's family was counseled in the holding area and the correct site was identified  Taken to the operating room and marked appropriately.  IV Ancef was given.  TED hose was applied on the involved leg.  After general anesthesia, the right thigh was placed on a leg holder and prepped with DuraPrep and draped in a sterile fashion.  Time-out was done confirming the right side.  It was exsanguinated with Esmarch, tourniquet was inflated to 300 mmHg. Arthroscopic portal was established proximal medial, inferomedial and inferolateral.  Diagnostic arthroscopy revealed the patellofemoral joint to be unremarkable, __________ tracking.  Medial and lateral gutters and suprapatellar pouch unremarkable.  ACL and PCL were intact.  Lateral side was inspected.  Lateral femoral condyle was unremarkable.  Lateral meniscus was intact.  Mild grade 1 __________ lateral tibial plateau did not require chondroplasty.  Medial compartment was inspected,  diffuse grade 3+ chondromalacia __________ shaver back to stable base.  Large complex tear of medial meniscus. __________ motorized shaver.  A partial medial meniscectomy was performed back to a stable base and gently smoothed down __________ cartilage.  The knee was then sequentially inspected.  There were no other abnormalities noted, irrigated and arthroscopic equipment was removed.  __________ was closed with nylon suture, 20 mL of 0.25% Sensorcaine placed in the skin intra-articular for pain and hemostasis after surgery.  Sterile dressing applied.  TED hose and ice pack. Tourniquet was deflated after application of dressing.  There were no complications or problems.  He was taken from the operating room to the PACU in stable condition.          ______________________________ Cynda Familia, M.D.     RAC/MEDQ  D:  12/31/2013  T:  01/01/2014  Job:  [id number]

## 2014-01-14 ENCOUNTER — Encounter: Payer: Self-pay | Admitting: *Deleted

## 2014-04-21 ENCOUNTER — Other Ambulatory Visit: Payer: Self-pay | Admitting: Cardiovascular Disease

## 2014-08-30 ENCOUNTER — Emergency Department (HOSPITAL_COMMUNITY): Payer: 59

## 2014-08-30 ENCOUNTER — Encounter (HOSPITAL_COMMUNITY): Payer: Self-pay | Admitting: Emergency Medicine

## 2014-08-30 ENCOUNTER — Emergency Department (HOSPITAL_COMMUNITY)
Admission: EM | Admit: 2014-08-30 | Discharge: 2014-08-30 | Disposition: A | Discharge status: Home / Self Care | Payer: 59 | Attending: Emergency Medicine | Admitting: Emergency Medicine

## 2014-08-30 DIAGNOSIS — Z79899 Other long term (current) drug therapy: Secondary | ICD-10-CM | POA: Diagnosis not present

## 2014-08-30 DIAGNOSIS — I480 Paroxysmal atrial fibrillation: Secondary | ICD-10-CM | POA: Diagnosis not present

## 2014-08-30 DIAGNOSIS — Z21 Asymptomatic human immunodeficiency virus [HIV] infection status: Secondary | ICD-10-CM | POA: Insufficient documentation

## 2014-08-30 DIAGNOSIS — Z85828 Personal history of other malignant neoplasm of skin: Secondary | ICD-10-CM | POA: Diagnosis not present

## 2014-08-30 DIAGNOSIS — I10 Essential (primary) hypertension: Secondary | ICD-10-CM | POA: Diagnosis not present

## 2014-08-30 DIAGNOSIS — Z8673 Personal history of transient ischemic attack (TIA), and cerebral infarction without residual deficits: Secondary | ICD-10-CM | POA: Diagnosis not present

## 2014-08-30 DIAGNOSIS — Z792 Long term (current) use of antibiotics: Secondary | ICD-10-CM | POA: Insufficient documentation

## 2014-08-30 DIAGNOSIS — I4891 Unspecified atrial fibrillation: Secondary | ICD-10-CM | POA: Diagnosis present

## 2014-08-30 DIAGNOSIS — Z87442 Personal history of urinary calculi: Secondary | ICD-10-CM | POA: Diagnosis not present

## 2014-08-30 LAB — BASIC METABOLIC PANEL
Anion gap: 7 (ref 5–15)
BUN: 21 mg/dL (ref 6–23)
CO2: 24 mmol/L (ref 19–32)
Calcium: 9.4 mg/dL (ref 8.4–10.5)
Chloride: 108 mmol/L (ref 96–112)
Creatinine, Ser: 1.06 mg/dL (ref 0.50–1.35)
GFR calc Af Amer: >90 mL/min (ref >90)
GFR calc non Af Amer: 78 mL/min — ABNORMAL LOW (ref >90)
Glucose, Bld: 127 mg/dL — ABNORMAL HIGH (ref 70–99)
Potassium: 4.1 mmol/L (ref 3.5–5.1)
Sodium: 139 mmol/L (ref 135–145)

## 2014-08-30 LAB — URINALYSIS, ROUTINE W REFLEX MICROSCOPIC
Bilirubin Urine: NEGATIVE
Glucose, UA: NEGATIVE mg/dL
Ketones, ur: NEGATIVE mg/dL
Leukocytes, UA: NEGATIVE
Nitrite: NEGATIVE
Protein, ur: NEGATIVE mg/dL
Specific Gravity, Urine: 1.026 (ref 1.005–1.030)
Urobilinogen, UA: 0.2 mg/dL (ref 0.0–1.0)
pH: 5.5 (ref 5.0–8.0)

## 2014-08-30 LAB — MAGNESIUM: Magnesium: 2.1 mg/dL (ref 1.5–2.5)

## 2014-08-30 LAB — I-STAT TROPONIN, ED: Troponin i, poc: 0 ng/mL (ref 0.00–0.08)

## 2014-08-30 LAB — URINE MICROSCOPIC-ADD ON

## 2014-08-30 LAB — CBC
HCT: 47.1 % (ref 39.0–52.0)
Hemoglobin: 16.2 g/dL (ref 13.0–17.0)
MCH: 30.7 pg (ref 26.0–34.0)
MCHC: 34.4 g/dL (ref 30.0–36.0)
MCV: 89.4 fL (ref 78.0–100.0)
Platelets: 219 10*3/uL (ref 150–400)
RBC: 5.27 MIL/uL (ref 4.22–5.81)
RDW: 13.8 % (ref 11.5–15.5)
WBC: 7 10*3/uL (ref 4.0–10.5)

## 2014-08-30 NOTE — ED Notes (Signed)
Patient with history of Afib, felt like he was in RVR, converted by himself, woke up later in the night and felt his heart racing again.  Patient is now in afib at 59.  Patient took 300mg  of fleccainide a few hours ago.

## 2014-08-30 NOTE — ED Provider Notes (Addendum)
CSN: [id number]     Arrival date & time 08/30/14  0306 History  This chart was scribed for Ethan Balls, MD by Eustaquio Maize, ED Scribe. This patient was seen in room A03C/A03C and the patient's care was started at 3:38 AM.    Chief Complaint  Patient presents with  . Atrial Fibrillation    The history is provided by the patient and the spouse. No language interpreter was used.     HPI Comments: Ethan Lambert is a 54 y.o. male with past medical history of Atrial fibrillation who presents to the Emergency Department complaining of A fib that began last night around 10 PM (approximately 5.5 hours ago). Pt reports that he felt his heart racing and took his Flecainide and went back to sleep. He states that he woke up at 2 AM in a cold sweat and had shortness of breath, causing him to come to the ED. He mentions that his ears began ringing as well. Spouse states that pt was told by his cardiologist that if the Flecainide does not work within 2 hours that he should come to the ED. He does report that on his way to the ED he went back into sinus rhythm. Pt does admit that he had a stressful day at work yesterday. He also mentions that on Thursday, 08/26/2014, he had myalgias and thought that he could be coming down with the flu. Pt also states that he has had numbness in his right hand, mainly in his 5th digit that began a couple weeks ago. Pt denies chest pain, rhinorrhea, cough, fever, or any other symptoms.    Past Medical History  Diagnosis Date  . Paroxysmal atrial fibrillation     CARDIOLOGIST--  DR Rayann Heman  . Hypertension   . HIV disease dx 32    MONITORED BY INFECTIOUS DISEASE -- DR Herbie Baltimore COMER  . Family history of malignant neoplasm of gastrointestinal tract   . History of CVA (cerebrovascular accident) no residuals    1999--  LEFT FRONTAL & OCCIPITAL , NON-HEMORRHAGIC  . History of nonmelanoma skin cancer   . History of kidney stones   . Right knee meniscal tear    Past Surgical  History  Procedure Laterality Date  . Appendectomy  06-09-2003  . Laparoscopic cholecystectomy  07-08-2003  . Inguinal hernia repair Bilateral 1990's  . Laser ablation anal condyloma  05-18-2001  . Transthoracic echocardiogram  12-01-2008    MODERATE LVH/  EF 55-60%/  MILD MR/  NEGATIVE BUBBLE STUDY  . Negative sleep study  01-03-2012  in epic  . Knee arthroscopy with medial menisectomy Right 12/31/2013    Procedure: RIGHT KNEE ARTHROSCOPY PARTIAL MEDIAL MENISCECTOMY DEBRIDEMENT AND CHONDROPLASTY ;  Surgeon: Sydnee Cabal, MD;  Location: [facility];  Service: Orthopedics;  Laterality: Right;   Family History  Problem Relation Age of Onset  . Arrhythmia Father   . Heart attack Brother   . Prostate cancer Maternal Grandfather   . Colon cancer Maternal Grandfather   . Lung cancer Mother     smoker   History  Substance Use Topics  . Smoking status: Never Smoker   . Smokeless tobacco: Never Used  . Alcohol Use: Yes     Comment: rare    Review of Systems  10 Systems reviewed and all are negative for acute change except as noted in the HPI.    Allergies  Morphine  Home Medications   Prior to Admission medications   Medication Sig Start Date  End Date Taking? Authorizing Provider  cephALEXin (KEFLEX) 500 MG capsule Take 1 capsule (500 mg total) by mouth 3 (three) times daily. 12/31/13   Bryson L Stilwell, PA-C  diltiazem (CARDIZEM CD) 240 MG 24 hr capsule Take 1 capsule by mouth  daily 04/22/14   Thompson Grayer, MD  efavirenz-emtricitabine-tenofovir (ATRIPLA) [phone] MG per tablet Take 1 tablet by mouth at bedtime. 11/09/13   Thayer Headings, MD  ELIQUIS 5 MG TABS tablet Take 1 tablet by mouth  twice a day 04/22/14   Thompson Grayer, MD  FLECAINIDE ACETATE PO Take by mouth as directed. Takes two pills prn atrial fibrillation    Historical Provider, MD  HYDROcodone-acetaminophen (NORCO) 5-325 MG per tablet Take 1-2 tablets by mouth every 6 (six) hours as needed for  moderate pain. 12/31/13   Bryson L Stilwell, PA-C  ondansetron (ZOFRAN ODT) 4 MG disintegrating tablet Take 1 tablet (4 mg total) by mouth every 8 (eight) hours as needed for nausea or vomiting. 12/31/13   Lajean Manes, PA-C   Triage Vitals: BP 131/81 mmHg  Pulse 82  Temp(Src) 98.3 F (36.8 C) (Oral)  Resp 18  Ht 6\' 1"  (1.854 m)  Wt 230 lb (104.327 kg)  BMI 30.35 kg/m2  SpO2 95%   Physical Exam  Constitutional: He is oriented to person, place, and time. Vital signs are normal. He appears well-developed and well-nourished.  Non-toxic appearance. He does not appear ill. No distress.  HENT:  Head: Normocephalic and atraumatic.  Nose: Nose normal.  Mouth/Throat: Oropharynx is clear and moist. No oropharyngeal exudate.  Eyes: Conjunctivae and EOM are normal. Pupils are equal, round, and reactive to light. No scleral icterus.  Neck: Normal range of motion. Neck supple. No tracheal deviation, no edema, no erythema and normal range of motion present. No thyroid mass and no thyromegaly present.  Cardiovascular: Normal rate, regular rhythm, S1 normal, S2 normal, normal heart sounds, intact distal pulses and normal pulses.  Exam reveals no gallop and no friction rub.   No murmur heard. Pulses:      Radial pulses are 2+ on the right side, and 2+ on the left side.       Dorsalis pedis pulses are 2+ on the right side, and 2+ on the left side.  Pulmonary/Chest: Effort normal and breath sounds normal. No respiratory distress. He has no wheezes. He has no rhonchi. He has no rales.  Abdominal: Soft. Normal appearance and bowel sounds are normal. He exhibits no distension, no ascites and no mass. There is no hepatosplenomegaly. There is no tenderness. There is no rebound, no guarding and no CVA tenderness.  Musculoskeletal: Normal range of motion. He exhibits no edema or tenderness.  Lymphadenopathy:    He has no cervical adenopathy.  Neurological: He is alert and oriented to person, place, and time.  He has normal strength. No cranial nerve deficit or sensory deficit.  Skin: Skin is warm, dry and intact. No petechiae and no rash noted. He is not diaphoretic. No erythema. No pallor.  Psychiatric: He has a normal mood and affect. His behavior is normal. Judgment normal.  Nursing note and vitals reviewed.   ED Course  Procedures (including critical care time)  DIAGNOSTIC STUDIES: Oxygen Saturation is 95% on RA, normal by my interpretation.    COORDINATION OF CARE: 3:44 AM-Discussed treatment plan which includes CXR, CBC, troponin with pt at bedside and pt agreed to plan.   Labs Review Labs Reviewed  BASIC METABOLIC PANEL - Abnormal; Notable for  the following:    Glucose, Bld 127 (*)    GFR calc non Af Amer 78 (*)    All other components within normal limits  URINALYSIS, ROUTINE W REFLEX MICROSCOPIC - Abnormal; Notable for the following:    Hgb urine dipstick TRACE (*)    All other components within normal limits  URINE MICROSCOPIC-ADD ON - Abnormal; Notable for the following:    Squamous Epithelial / LPF FEW (*)    Bacteria, UA FEW (*)    All other components within normal limits  CBC  MAGNESIUM  I-STAT TROPOININ, ED    Imaging Review Dg Chest 2 View  08/30/2014   CLINICAL DATA:  Atrial fibrillation.  EXAM: CHEST  2 VIEW  COMPARISON:  08/04/2008  FINDINGS: Minimal atelectasis at the left lung base. The cardiomediastinal contours are normal. Pulmonary vasculature is normal. No consolidation, pleural effusion, or pneumothorax. No acute osseous abnormalities are seen.  IMPRESSION: Minimal atelectasis at the left lung base.  Otherwise clear lungs.   Electronically Signed   By: Jeb Levering M.D.   On: 08/30/2014 03:56     EKG Interpretation   Date/Time:  Tuesday August 30 2014 03:09:04 EST Ventricular Rate:  83 PR Interval:  204 QRS Duration: 114 QT Interval:  390 QTC Calculation: 458 R Axis:   -80 Text Interpretation:  Normal sinus rhythm Left axis deviation No   significant change since last tracing Confirmed by Glynn Octave  [phone]) on 08/30/2014 4:23:05 AM      MDM   Final diagnoses:  None   patient presents to the emergency department after going into atrial fibrillation with RVR. He took his flecainide 300 mg at home. Normally this was also in 2 hours however this time it took over 4 hours. Patient be came concerned and came to the emergency depart. I spoke with cardiology he states that this is okay as long as he has a normal sinus rhythm now. He currently is anything asymptomatic. Electrolytes are normal, infectious workup was negative. I recommend for the patient to follow-up with cardiology within this week for continued management. He was given return precautions to come back for any concerns or if this happens again. Patient indicates understanding. His vital signs remain within his normal limits he is safe for discharge.  I personally performed the services described in this documentation, which was scribed in my presence. The recorded information has been reviewed and is accurate.      Ethan Balls, MD 08/30/14 [id number]  Ethan Balls, MD 08/30/14 [phone]

## 2014-08-30 NOTE — Discharge Instructions (Signed)
Atrial Fibrillation Ethan Lambert, continue to take your medications and use flecainide if your heart rate is fast. Follow-up with cardiology within this week for continued management. If symptoms worsen come back to emergency department immediately. Thank you. Atrial fibrillation is a condition that causes your heart to beat irregularly. It may also cause your heart to beat faster than normal. Atrial fibrillation can prevent your heart from pumping blood normally. It increases your risk of stroke and heart problems. HOME CARE  Take medications as told by your doctor.  Only take medications that your doctor says are safe. Some medications can make the condition worse or happen again.  If blood thinners were prescribed by your doctor, take them exactly as told. Too much can cause bleeding. Too little and you will not have the needed protection against stroke and other problems.  Perform blood tests at home if told by your doctor.  Perform blood tests exactly as told by your doctor.  Do not drink alcohol.  Do not drink beverages with caffeine such as coffee, soda, and some teas.  Maintain a healthy weight.  Do not use diet pills unless your doctor says they are safe. They may make heart problems worse.  Follow diet instructions as told by your doctor.  Exercise regularly as told by your doctor.  Keep all follow-up appointments. GET HELP IF:  You notice a change in the speed, rhythm, or strength of your heartbeat.  You suddenly begin peeing (urinating) more often.  You get tired more easily when moving or exercising. GET HELP RIGHT AWAY IF:   You have chest or belly (abdominal) pain.  You feel sick to your stomach (nauseous).  You are short of breath.  You suddenly have swollen feet and ankles.  You feel dizzy.  You face, arms, or legs feel numb or weak.  There is a change in your vision or speech. MAKE SURE YOU:   Understand these instructions.  Will watch your  condition.  Will get help right away if you are not doing well or get worse. Document Released: 03/19/2008 Document Revised: 10/25/2013 Document Reviewed: 07/21/2012 [facility] Patient Information 2015 [place], [place]. This information is not intended to replace advice given to you by your health care provider. Make sure you discuss any questions you have with your health care provider.

## 2014-09-11 ENCOUNTER — Other Ambulatory Visit: Payer: Self-pay | Admitting: Internal Medicine

## 2014-11-08 ENCOUNTER — Encounter (HOSPITAL_COMMUNITY): Payer: Self-pay | Admitting: Emergency Medicine

## 2014-11-08 ENCOUNTER — Emergency Department (HOSPITAL_COMMUNITY)
Admission: EM | Admit: 2014-11-08 | Discharge: 2014-11-08 | Disposition: A | Discharge status: Home / Self Care | Payer: 59 | Source: Home / Self Care | Attending: Family Medicine | Admitting: Family Medicine

## 2014-11-08 DIAGNOSIS — R202 Paresthesia of skin: Secondary | ICD-10-CM

## 2014-11-08 DIAGNOSIS — M545 Low back pain, unspecified: Secondary | ICD-10-CM

## 2014-11-08 DIAGNOSIS — M79609 Pain in unspecified limb: Secondary | ICD-10-CM

## 2014-11-08 DIAGNOSIS — M5417 Radiculopathy, lumbosacral region: Secondary | ICD-10-CM

## 2014-11-08 DIAGNOSIS — M79605 Pain in left leg: Secondary | ICD-10-CM

## 2014-11-08 MED ORDER — TRAMADOL HCL 50 MG PO TABS: 50.0000 mg | ORAL_TABLET | Freq: Four times a day (QID) | ORAL | Status: DC | PRN

## 2014-11-08 MED ORDER — PREDNISOLONE 5 MG (21) PO TBPK: 1.0000 | ORAL_TABLET | ORAL | Status: DC

## 2014-11-08 NOTE — ED Provider Notes (Signed)
CSN: [id number]     Arrival date & time 11/08/14  1500 History   First MD Initiated Contact with Patient 11/08/14 1632     Chief Complaint  Patient presents with  . 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 Pain is located over the mid spine at L5 S1. Pain radiates to the left lateral lumbosacral area as well as down the buttock posterior thigh and anterior thigh. There is actually less pain in the left lower extremity then there is paresthesia. Denies any known trauma. He has had similar pain in the remote past but it eventually abated spontaneously.   Past Medical History  Diagnosis Date  . Paroxysmal atrial fibrillation     CARDIOLOGIST--  DR Rayann Heman  . Hypertension   . HIV disease dx 34    MONITORED BY INFECTIOUS DISEASE -- DR Herbie Baltimore COMER  . Family history of malignant neoplasm of gastrointestinal tract   . History of CVA (cerebrovascular accident) no residuals    1999--  LEFT FRONTAL & OCCIPITAL , NON-HEMORRHAGIC  . History of nonmelanoma skin cancer   . History of kidney stones   . Right knee meniscal tear    Past Surgical History  Procedure Laterality Date  . Appendectomy  06-09-2003  . Laparoscopic cholecystectomy  07-08-2003  . Inguinal hernia repair Bilateral 1990's  . Laser ablation anal condyloma  05-18-2001  . Transthoracic echocardiogram  12-01-2008    MODERATE LVH/  EF 55-60%/  MILD MR/  NEGATIVE BUBBLE STUDY  . Negative sleep study  01-03-2012  in epic  . Knee arthroscopy with medial menisectomy Right 12/31/2013    Procedure: RIGHT KNEE ARTHROSCOPY PARTIAL MEDIAL MENISCECTOMY DEBRIDEMENT AND CHONDROPLASTY ;  Surgeon: Sydnee Cabal, MD;  Location: [facility];  Service: Orthopedics;  Laterality: Right;    Family History  Problem Relation Age of Onset  . Arrhythmia Father   . Heart attack Brother   . Prostate cancer Maternal Grandfather   . Colon cancer Maternal Grandfather   . Lung cancer Mother     smoker   History  Substance Use Topics  . Smoking status: Never Smoker   . Smokeless tobacco: Never Used  . Alcohol Use: Yes     Comment: rare    Review of Systems  Constitutional: Positive for activity change.  Respiratory: Negative.   Gastrointestinal: Negative.   Genitourinary: Negative.   Musculoskeletal: Positive for myalgias and back pain.       As per HPI  Skin: Negative.   Neurological: Negative for dizziness, weakness, numbness and headaches.    Allergies  Morphine  Home Medications   Prior to Admission medications   Medication Sig Start Date End Date Taking? Authorizing Provider  diltiazem (CARDIZEM CD) 240 MG 24 hr capsule Take 1 capsule by mouth  daily 04/22/14  Yes Thompson Grayer, MD  efavirenz-emtricitabine-tenofovir (ATRIPLA) [phone] MG per tablet Take 1 tablet by mouth at bedtime. 11/09/13  Yes Thayer Headings, MD  ELIQUIS 5 MG TABS tablet Take 1 tablet by mouth  twice a day 04/22/14   Thompson Grayer, MD  flecainide (TAMBOCOR) 150 MG tablet Take 300 mg by mouth 2 (two) times daily as needed (A FIB).    Historical Provider, MD  FLECAINIDE ACETATE PO Take by mouth as directed. Takes two pills prn atrial fibrillation  Historical Provider, MD  Omega-3 Fatty Acids (FISH OIL) 1000 MG CAPS Take 1,000 mg by mouth daily.    Historical Provider, MD  PrednisoLONE (MILLIPRED DP) 5 MG (21) TBPK Take 1 Package by mouth as directed. Medrol Dose Pak. Take as directed. 11/08/14   Janne Napoleon, NP  traMADol (ULTRAM) 50 MG tablet Take 1 tablet (50 mg total) by mouth every 6 (six) hours as needed. 11/08/14   Janne Napoleon, NP   BP 128/91 mmHg  Pulse 68  Temp(Src) 98.3 F (36.8 C) (Oral)  Resp 16  SpO2 97% Physical Exam  Constitutional: He is oriented to person, place, and time.  He appears well-developed and well-nourished. No distress.  Neck: Normal range of motion. Neck supple.  Cardiovascular: Normal rate.   Pulmonary/Chest: Effort normal. No respiratory distress.  Abdominal: There is no tenderness.  Musculoskeletal: He exhibits tenderness.  Minimal tenderness over the lower lumbar sacral spine. But that is the point of maximal pain. Lesser pain to the left para lumbosacral musculature. There is some swelling in the lateral aspect of the waist. It feels more like fatty tissue but it is larger than the right. No palpable mass or suspected hematoma based on appearance and palpation. Minor tenderness to the posterior buttock. Positive for straight leg raise. Raising himself up from a supine position to sitting and standing from a sitting position causes more pain to the lower back. Strength in the left lower leg is near equal to that of the right. Distal neurovascular motor Sentry is intact. Pedal pulses 2+. Patient also made a remark about numbness in the fourth and fifth right digits. Positive Phalen sign. There is also tenderness over the right lateral epicondyles. Distal neurovascular motor sensory is grossly intact to the right upper extremity as well.  Lymphadenopathy:    He has no cervical adenopathy.  Neurological: He is alert and oriented to person, place, and time. He exhibits normal muscle tone.  Skin: Skin is warm.  Psychiatric: He has a normal mood and affect.  Nursing note and vitals reviewed.   ED Course  Procedures (including critical care time) Labs Review Labs Reviewed - No data to display  Imaging Review No results found.   MDM   1. Low back pain radiating to left lower extremity   2. Radiculopathy of lumbosacral region   3. Paresthesia and pain of left extremity    Back pain instructions. Suspect pathology L5-S1 Medrol Dosepack Tramadol 50 mg #15. F/U with orthoped. Approximately 30-35 minutes spent with patient at bedside answering  several questions and examination.  Janne Napoleon, NP 11/08/14 [phone]

## 2014-11-08 NOTE — ED Notes (Signed)
C/o left flank swelling with lower back pain and numbness in the left leg.   States on set was Friday and subsided that day but returned the next morning and has gotten worse.  Denies any urinary symptoms or injury.   Also c/o numbness in the right ring and pinky fingers.   No relief with otc meds or heating pad.

## 2014-11-08 NOTE — Discharge Instructions (Signed)
Back Pain, Adult Low back pain is very common. About 1 in 5 people have back pain.The cause of low back pain is rarely dangerous. The pain often gets better over time.About half of people with a sudden onset of back pain feel better in just 2 weeks. About 8 in 10 people feel better by 6 weeks.  CAUSES Some common causes of back pain include:  Strain of the muscles or ligaments supporting the spine.  Wear and tear (degeneration) of the spinal discs.  Arthritis.  Direct injury to the back. DIAGNOSIS Most of the time, the direct cause of low back pain is not known.However, back pain can be treated effectively even when the exact cause of the pain is unknown.Answering your caregiver's questions about your overall health and symptoms is one of the most accurate ways to make sure the cause of your pain is not dangerous. If your caregiver needs more information, he or she may order lab work or imaging tests (X-rays or MRIs).However, even if imaging tests show changes in your back, this usually does not require surgery. HOME CARE INSTRUCTIONS For many people, back pain returns.Since low back pain is rarely dangerous, it is often a condition that people can learn to [facility] their own.   Remain active. It is stressful on the back to sit or stand in one place. Do not sit, drive, or stand in one place for more than 30 minutes at a time. Take short walks on level surfaces as soon as pain allows.Try to increase the length of time you walk each day.  Do not stay in bed.Resting more than 1 or 2 days can delay your recovery.  Do not avoid exercise or work.Your body is made to move.It is not dangerous to be active, even though your back may hurt.Your back will likely heal faster if you return to being active before your pain is gone.  Pay attention to your body when you bend and lift. Many people have less discomfortwhen lifting if they bend their knees, keep the load close to their bodies,and  avoid twisting. Often, the most comfortable positions are those that put less stress on your recovering back.  Find a comfortable position to sleep. Use a firm mattress and lie on your side with your knees slightly bent. If you lie on your back, put a pillow under your knees.  Only take over-the-counter or prescription medicines as directed by your caregiver. Over-the-counter medicines to reduce pain and inflammation are often the most helpful.Your caregiver may prescribe muscle relaxant drugs.These medicines help dull your pain so you can more quickly return to your normal activities and healthy exercise.  Put ice on the injured area.  Put ice in a plastic bag.  Place a towel between your skin and the bag.  Leave the ice on for 15-20 minutes, 03-04 times a day for the first 2 to 3 days. After that, ice and heat may be alternated to reduce pain and spasms.  Ask your caregiver about trying back exercises and gentle massage. This may be of some benefit.  Avoid feeling anxious or stressed.Stress increases muscle tension and can worsen back pain.It is important to recognize when you are anxious or stressed and learn ways to manage it.Exercise is a great option. SEEK MEDICAL CARE IF:  You have pain that is not relieved with rest or medicine.  You have pain that does not improve in 1 week.  You have new symptoms.  You are generally not feeling well. SEEK  IMMEDIATE MEDICAL CARE IF:   You have pain that radiates from your back into your legs.  You develop new bowel or bladder control problems.  You have unusual weakness or numbness in your arms or legs.  You develop nausea or vomiting.  You develop abdominal pain.  You feel faint. Document Released: 06/10/2005 Document Revised: 12/10/2011 Document Reviewed: 10/12/2013 [facility] Patient Information 2015 [place], [place]. This information is not intended to replace advice given to you by your health care provider. Make sure you  discuss any questions you have with your health care provider.  Lumbosacral Radiculopathy Lumbosacral radiculopathy is a pinched nerve or nerves in the low back (lumbosacral area). When this happens you may have weakness in your legs and may not be able to stand on your toes. You may have pain going down into your legs. There may be difficulties with walking normally. There are many causes of this problem. Sometimes this may happen from an injury, or simply from arthritis or boney problems. It may also be caused by other illnesses such as diabetes. If there is no improvement after treatment, further studies may be done to find the exact cause. DIAGNOSIS  X-rays may be needed if the problems become long standing. Electromyograms may be done. This study is one in which the working of nerves and muscles is studied. HOME CARE INSTRUCTIONS   Applications of ice packs may be helpful. Ice can be used in a plastic bag with a towel around it to prevent frostbite to skin. This may be used every 2 hours for 20 to 30 minutes, or as needed, while awake, or as directed by your caregiver.  Only take over-the-counter or prescription medicines for pain, discomfort, or fever as directed by your caregiver.  If physical therapy was prescribed, follow your caregiver's directions. SEEK IMMEDIATE MEDICAL CARE IF:   You have pain not controlled with medications.  You seem to be getting worse rather than better.  You develop increasing weakness in your legs.  You develop loss of bowel or bladder control.  You have difficulty with walking or balance, or develop clumsiness in the use of your legs.  You have a fever. MAKE SURE YOU:   Understand these instructions.  Will watch your condition.  Will get help right away if you are not doing well or get worse. Document Released: 06/10/2005 Document Revised: 09/02/2011 Document Reviewed: 01/29/2008 [facility] Patient Information 2015 [place], [place]. This information  is not intended to replace advice given to you by your health care provider. Make sure you discuss any questions you have with your health care provider.  Paresthesia Paresthesia is a burning or prickling feeling. This feeling can happen in any part of the body. It often happens in the hands, arms, legs, or feet. HOME CARE  Avoid drinking alcohol.  Try massage or needle therapy (acupuncture) to help with your problems.  Keep all doctor visits as told. GET HELP RIGHT AWAY IF:   You feel weak.  You have trouble walking or moving.  You have problems speaking or seeing.  You feel confused.  You cannot control when you poop (bowel movement) or pee (urinate).  You lose feeling (numbness) after an injury.  You pass out (faint).  Your burning or prickling feeling gets worse when you walk.  You have pain, cramps, or feel dizzy.  You have a rash. MAKE SURE YOU:   Understand these instructions.  Will watch your condition.  Will get help right away if you are not  doing well or get worse. Document Released: 05/23/2008 Document Revised: 09/02/2011 Document Reviewed: 03/01/2011 [facility] Patient Information 2015 [place], [place]. This information is not intended to replace advice given to you by your health care provider. Make sure you discuss any questions you have with your health care provider.  Radicular Pain Radicular pain in either the arm or leg is usually from a bulging or herniated disk in the spine. A piece of the herniated disk may press against the nerves as the nerves exit the spine. This causes pain which is felt at the tips of the nerves down the arm or leg. Other causes of radicular pain may include:  Fractures.  Heart disease.  Cancer.  An abnormal and usually degenerative state of the nervous system or nerves (neuropathy). Diagnosis may require CT or MRI scanning to determine the primary cause.  Nerves that start at the neck (nerve roots) may cause radicular pain  in the outer shoulder and arm. It can spread down to the thumb and fingers. The symptoms vary depending on which nerve root has been affected. In most cases radicular pain improves with conservative treatment. Neck problems may require physical therapy, a neck collar, or cervical traction. Treatment may take many weeks, and surgery may be considered if the symptoms do not improve.  Conservative treatment is also recommended for sciatica. Sciatica causes pain to radiate from the lower back or buttock area down the leg into the foot. Often there is a history of back problems. Most patients with sciatica are better after 2 to 4 weeks of rest and other supportive care. Short term bed rest can reduce the disk pressure considerably. Sitting, however, is not a good position since this increases the pressure on the disk. You should avoid bending, lifting, and all other activities which make the problem worse. Traction can be used in severe cases. Surgery is usually reserved for patients who do not improve within the first months of treatment. Only take over-the-counter or prescription medicines for pain, discomfort, or fever as directed by your caregiver. Narcotics and muscle relaxants may help by relieving more severe pain and spasm and by providing mild sedation. Cold or massage can give significant relief. Spinal manipulation is not recommended. It can increase the degree of disc protrusion. Epidural steroid injections are often effective treatment for radicular pain. These injections deliver medicine to the spinal nerve in the space between the protective covering of the spinal cord and back bones (vertebrae). Your caregiver can give you more information about steroid injections. These injections are most effective when given within two weeks of the onset of pain.  You should see your caregiver for follow up care as recommended. A program for neck and back injury rehabilitation with stretching and strengthening  exercises is an important part of management.  SEEK IMMEDIATE MEDICAL CARE IF:  You develop increased pain, weakness, or numbness in your arm or leg.  You develop difficulty with bladder or bowel control.  You develop abdominal pain. Document Released: 07/18/2004 Document Revised: 09/02/2011 Document Reviewed: 10/03/2008 [facility] Patient Information 2015 [place], [place]. This information is not intended to replace advice given to you by your health care provider. Make sure you discuss any questions you have with your health care provider.

## 2014-12-19 ENCOUNTER — Other Ambulatory Visit: Payer: Self-pay | Admitting: *Deleted

## 2014-12-19 DIAGNOSIS — B20 Human immunodeficiency virus [HIV] disease: Secondary | ICD-10-CM

## 2014-12-19 MED ORDER — EFAVIRENZ-EMTRICITAB-TENOFOVIR 600-200-300 MG PO TABS: 1.0000 | ORAL_TABLET | Freq: Every day | ORAL | Status: DC

## 2014-12-20 ENCOUNTER — Other Ambulatory Visit: Payer: Self-pay

## 2014-12-21 ENCOUNTER — Other Ambulatory Visit: Payer: 59

## 2014-12-21 DIAGNOSIS — B20 Human immunodeficiency virus [HIV] disease: Secondary | ICD-10-CM

## 2014-12-21 DIAGNOSIS — Z113 Encounter for screening for infections with a predominantly sexual mode of transmission: Secondary | ICD-10-CM

## 2014-12-21 DIAGNOSIS — Z79899 Other long term (current) drug therapy: Secondary | ICD-10-CM

## 2014-12-21 LAB — COMPLETE METABOLIC PANEL WITH GFR
ALT: 26 U/L (ref 0–53)
AST: 26 U/L (ref 0–37)
Albumin: 4.1 g/dL (ref 3.5–5.2)
Alkaline Phosphatase: 80 U/L (ref 39–117)
BUN: 17 mg/dL (ref 6–23)
CO2: 26 mEq/L (ref 19–32)
Calcium: 9.5 mg/dL (ref 8.4–10.5)
Chloride: 103 mEq/L (ref 96–112)
Creat: 0.85 mg/dL (ref 0.50–1.35)
GFR, Est African American: >89 mL/min
GFR, Est Non African American: >89 mL/min
Glucose, Bld: 117 mg/dL — ABNORMAL HIGH (ref 70–99)
Potassium: 4.3 mEq/L (ref 3.5–5.3)
Sodium: 142 mEq/L (ref 135–145)
Total Bilirubin: 0.6 mg/dL (ref 0.2–1.2)
Total Protein: 7.4 g/dL (ref 6.0–8.3)

## 2014-12-21 LAB — CBC WITH DIFFERENTIAL/PLATELET
Basophils Absolute: 0.1 10*3/uL (ref 0.0–0.1)
Basophils Relative: 1 % (ref 0–1)
Eosinophils Absolute: 0.2 10*3/uL (ref 0.0–0.7)
Eosinophils Relative: 3 % (ref 0–5)
HCT: 44.6 % (ref 39.0–52.0)
Hemoglobin: 15 g/dL (ref 13.0–17.0)
Lymphocytes Relative: 37 % (ref 12–46)
Lymphs Abs: 1.9 10*3/uL (ref 0.7–4.0)
MCH: 30.1 pg (ref 26.0–34.0)
MCHC: 33.6 g/dL (ref 30.0–36.0)
MCV: 89.6 fL (ref 78.0–100.0)
MPV: 9.4 fL (ref 8.6–12.4)
Monocytes Absolute: 0.6 10*3/uL (ref 0.1–1.0)
Monocytes Relative: 12 % (ref 3–12)
Neutro Abs: 2.4 10*3/uL (ref 1.7–7.7)
Neutrophils Relative %: 47 % (ref 43–77)
Platelets: 211 10*3/uL (ref 150–400)
RBC: 4.98 MIL/uL (ref 4.22–5.81)
RDW: 14.3 % (ref 11.5–15.5)
WBC: 5 10*3/uL (ref 4.0–10.5)

## 2014-12-21 LAB — LIPID PANEL
Cholesterol: 175 mg/dL (ref 0–200)
HDL: 24 mg/dL — ABNORMAL LOW (ref >=40)
LDL Cholesterol: 90 mg/dL (ref 0–99)
Total CHOL/HDL Ratio: 7.3 Ratio
Triglycerides: 305 mg/dL — ABNORMAL HIGH (ref <150)
VLDL: 61 mg/dL — ABNORMAL HIGH (ref 0–40)

## 2014-12-21 LAB — RPR

## 2014-12-22 ENCOUNTER — Encounter: Payer: Self-pay | Admitting: *Deleted

## 2014-12-22 ENCOUNTER — Telehealth: Payer: Self-pay | Admitting: *Deleted

## 2014-12-22 LAB — T-HELPER CELL (CD4) - (RCID CLINIC ONLY)
CD4 % Helper T Cell: 39 % (ref 33–55)
CD4 T Cell Abs: 700 /uL (ref 400–2700)

## 2014-12-22 LAB — URINE CYTOLOGY ANCILLARY ONLY
Chlamydia: NEGATIVE
Neisseria Gonorrhea: NEGATIVE

## 2014-12-22 NOTE — Telephone Encounter (Signed)
called to update fm status, spoke to pt, updated chart accordingly.

## 2014-12-23 LAB — HIV-1 RNA QUANT-NO REFLEX-BLD
HIV 1 RNA Quant: <20 copies/mL (ref <20)
HIV-1 RNA Quant, Log: <1.3 {Log} (ref <1.30)

## 2014-12-28 ENCOUNTER — Encounter: Payer: Self-pay | Admitting: Internal Medicine

## 2014-12-28 ENCOUNTER — Ambulatory Visit (INDEPENDENT_AMBULATORY_CARE_PROVIDER_SITE_OTHER): Payer: 59 | Admitting: Internal Medicine

## 2014-12-28 VITALS — BP 152/98 | HR 71 | Ht 73.0 in | Wt 284.0 lb

## 2014-12-28 DIAGNOSIS — I6350 Cerebral infarction due to unspecified occlusion or stenosis of unspecified cerebral artery: Secondary | ICD-10-CM

## 2014-12-28 DIAGNOSIS — I480 Paroxysmal atrial fibrillation: Secondary | ICD-10-CM

## 2014-12-28 DIAGNOSIS — I10 Essential (primary) hypertension: Secondary | ICD-10-CM

## 2014-12-28 DIAGNOSIS — I639 Cerebral infarction, unspecified: Secondary | ICD-10-CM

## 2014-12-28 LAB — HLA B*5701: HLA-B*5701 w/rflx HLA-B High: NEGATIVE

## 2014-12-28 MED ORDER — DILTIAZEM HCL ER BEADS 360 MG PO CP24: 360.0000 mg | ORAL_CAPSULE | Freq: Every day | ORAL | Status: DC

## 2014-12-28 NOTE — Progress Notes (Signed)
Electrophysiology Office Note   Date:  12/28/2014   ID:  OVID Ethan Lambert, DOB 1960/07/16, MRN [id number]  PCP:  No PCP Per Patient   Primary Electrophysiologist: Thompson Grayer, MD    Chief Complaint  Patient presents with  . Atrial Fibrillation     History of Present Illness: Ethan Lambert is a 54 y.o. male who presents today for electrophysiology evaluation.   Over the past 6 months or so, his afib has increased.  He has required flecainide pill in pocket on several occasions.  His last afib was >6 weeks ago.  He does not feel that it is frequent enough to require daily AAD therapy.  He reports compliance with eliquis.  He has been out of diltiazem for several days.  Today, he denies symptoms of palpitations, chest pain, shortness of breath, orthopnea, PND, lower extremity edema, claudication, dizziness, presyncope, syncope, bleeding, or neurologic sequela. The patient is tolerating medications without difficulties and is otherwise without complaint today.    Past Medical History  Diagnosis Date  . Paroxysmal atrial fibrillation     CARDIOLOGIST--  DR Rayann Heman  . Hypertension   . HIV disease dx 26    MONITORED BY INFECTIOUS DISEASE -- DR Herbie Baltimore COMER  . Family history of malignant neoplasm of gastrointestinal tract   . History of CVA (cerebrovascular accident) no residuals    1999--  LEFT FRONTAL & OCCIPITAL , NON-HEMORRHAGIC  . History of nonmelanoma skin cancer   . History of kidney stones   . Right knee meniscal tear    Past Surgical History  Procedure Laterality Date  . Appendectomy  06-09-2003  . Laparoscopic cholecystectomy  07-08-2003  . Inguinal hernia repair Bilateral 1990's  . Laser ablation anal condyloma  05-18-2001  . Transthoracic echocardiogram  12-01-2008    MODERATE LVH/  EF 55-60%/  MILD MR/  NEGATIVE BUBBLE STUDY  . Negative sleep study  01-03-2012  in epic  . Knee arthroscopy with medial menisectomy Right 12/31/2013    Procedure: RIGHT KNEE  ARTHROSCOPY PARTIAL MEDIAL MENISCECTOMY DEBRIDEMENT AND CHONDROPLASTY ;  Surgeon: Sydnee Cabal, MD;  Location: [facility];  Service: Orthopedics;  Laterality: Right;     Current Outpatient Prescriptions  Medication Sig Dispense Refill  . efavirenz-emtricitabine-tenofovir (ATRIPLA) [phone] MG per tablet Take 1 tablet by mouth at bedtime. 30 tablet 0  . ELIQUIS 5 MG TABS tablet Take 1 tablet by mouth  twice a day 180 tablet 2  . flecainide (TAMBOCOR) 150 MG tablet Take 300 mg by mouth 2 (two) times daily as needed (A FIB).    [name] FLECAINIDE ACETATE PO Take 1 tablet by mouth 2 (two) times daily as needed (AFIB). Pt unsure of strength (12/28/14) Pt also takes 2 (150 mg) tablets by mouth twice daily as needed for AFIB    . Omega-3 Fatty Acids (FISH OIL) 1000 MG CAPS Take 1,000 mg by mouth daily.    [name] diltiazem (CARDIZEM CD) 240 MG 24 hr capsule Take 1 capsule by mouth  daily (Patient not taking: Reported on 12/28/2014) 90 capsule 2   No current facility-administered medications for this visit.    Allergies:   Morphine   Social History:  The patient  reports that he has never smoked. He has never used smokeless tobacco. He reports that he drinks alcohol. He reports that he does not use illicit drugs.   Family History:  The patient's  family history includes Arrhythmia in his father; Colon cancer in his maternal grandfather; Heart  attack in his brother; Hyperlipidemia in his mother; Hypertension in his mother; Lung cancer in his mother; Other in his maternal grandmother; Prostate cancer in his maternal grandfather.    ROS:  Please see the history of present illness.   All other systems are reviewed and negative.    PHYSICAL EXAM: VS:  BP 152/98 mmHg  Pulse 71  Ht 6\' 1"  (1.854 m)  Wt 128.822 kg (284 lb)  BMI 37.48 kg/m2 , BMI Body mass index is 37.48 kg/(m^2). GEN: Well nourished, well developed, in no acute distress HEENT: normal Neck: no JVD, carotid bruits, or  masses Cardiac: RRR; no murmurs, rubs, or gallops,no edema  Respiratory:  clear to auscultation bilaterally, normal work of breathing GI: soft, nontender, nondistended, + BS MS: no deformity or atrophy Skin: warm and dry  Neuro:  Strength and sensation are intact Psych: euthymic mood, full affect  EKG:  EKG is ordered today. The ekg ordered today shows sinus rhythm PR 182 msec,    Recent Labs: 08/30/2014: Magnesium 2.1 12/21/2014: ALT 26; BUN 17; Creat 0.85; Hemoglobin 15.0; Platelets 211; Potassium 4.3; Sodium 142    Lipid Panel     Component Value Date/Time   CHOL 175 12/21/2014 1003   TRIG 305* 12/21/2014 1003   TRIG 369 02/16/2010 0000   HDL 24* 12/21/2014 1003   CHOLHDL 7.3 12/21/2014 1003   VLDL 61* 12/21/2014 1003   LDLCALC 90 12/21/2014 1003     Wt Readings from Last 3 Encounters:  12/28/14 128.822 kg (284 lb)  08/30/14 104.327 kg (230 lb)  12/31/13 109.09 kg (240 lb 8 oz)      Other studies Reviewed: Additional studies/ records that were reviewed today include: Eulis Foster notes   ASSESSMENT AND PLAN:  1.  Paroxysmal atrial fibrillation Recently increased burden Will increase diltiazem to 360mg  daily If afib worsens, could consider flecainide 100mg  BID.  For now, will continue as prn. chads2vasc score is at least 3.  Continue eliquis  2. Hypertension Elevated today Increase diltiazem CD to 360mg  daily  3. Prior stroke Importance of anticoagulation was discussed today  Follow-up:  Will have him see Roderic Palau NP in the AF clinic every 3 months.  I will see when needed  Current medicines are reviewed at length with the patient today.   The patient does not have concerns regarding his medicines.  The following changes were made today:  none   Signed, Thompson Grayer, MD  12/28/2014 10:16 AM     [facility] [address] [place] [address] [phone] (office) [phone] (fax)

## 2014-12-28 NOTE — Patient Instructions (Signed)
Medication Instructions: - Increase diltiazem to 360 mg one capsule by mouth once daily  Labwork: - none  Procedures/Testing: - none  Follow-Up: - Your physician recommends that you schedule a follow-up appointment in: 3 months with Roderic Palau, NP in the A-fib clinic  Any Additional Special Instructions Will Be Listed Below (If Applicable). - none

## 2015-01-18 ENCOUNTER — Other Ambulatory Visit: Payer: Self-pay | Admitting: *Deleted

## 2015-01-18 DIAGNOSIS — B20 Human immunodeficiency virus [HIV] disease: Secondary | ICD-10-CM

## 2015-01-18 MED ORDER — EFAVIRENZ-EMTRICITAB-TENOFOVIR 600-200-300 MG PO TABS: 1.0000 | ORAL_TABLET | Freq: Every day | ORAL | Status: DC

## 2015-01-25 ENCOUNTER — Telehealth: Payer: Self-pay | Admitting: *Deleted

## 2015-01-25 NOTE — Telephone Encounter (Signed)
Left message reminding of appointment tomorrow

## 2015-01-26 ENCOUNTER — Ambulatory Visit (INDEPENDENT_AMBULATORY_CARE_PROVIDER_SITE_OTHER): Payer: 59 | Admitting: Internal Medicine

## 2015-01-26 ENCOUNTER — Encounter: Payer: Self-pay | Admitting: Internal Medicine

## 2015-01-26 VITALS — BP 146/95 | HR 73 | Temp 98.2°F | Ht 73.0 in | Wt 240.0 lb

## 2015-01-26 DIAGNOSIS — B20 Human immunodeficiency virus [HIV] disease: Secondary | ICD-10-CM

## 2015-01-26 DIAGNOSIS — R5383 Other fatigue: Secondary | ICD-10-CM | POA: Insufficient documentation

## 2015-01-26 DIAGNOSIS — I10 Essential (primary) hypertension: Secondary | ICD-10-CM

## 2015-01-26 MED ORDER — ABACAVIR-DOLUTEGRAVIR-LAMIVUD 600-50-300 MG PO TABS: 1.0000 | ORAL_TABLET | Freq: Every day | ORAL | Status: DC

## 2015-01-26 NOTE — Assessment & Plan Note (Signed)
Likely from [place], work.  As above, will change off efavirenz.

## 2015-01-26 NOTE — Progress Notes (Signed)
   Subjective:    Patient ID: Ethan Lambert, male    DOB: 12-Dec-1960, 54 y.o.   MRN: [id number]  HPI  Here for follow up of HIV.  Last seen December 2014 and on Atripla for many years.  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  Takes Atripla daily.  Does get drowsy, light-headed with Atrpila  Works some days and some night shifts.  No missed doses, no n/v/d.  Labs reviewed, CD4 700, viral load undetectable, creat ok.  LDL good, TG up but not fasting. Does not exercise.    Review of Systems  Constitutional: Positive for fatigue. Negative for activity change.  HENT: Negative for trouble swallowing.   Respiratory: Negative for shortness of breath.   Gastrointestinal: Negative for nausea and diarrhea.  Skin: Negative for rash.  Neurological: Negative for dizziness and light-headedness.       Objective:   Physical Exam  Constitutional: He appears well-developed and well-nourished. No distress.  HENT:  Mouth/Throat: No oropharyngeal exudate.  Eyes: No scleral icterus.  Cardiovascular: Normal rate, regular rhythm and normal heart sounds.   No murmur heard. Pulmonary/Chest: Effort normal and breath sounds normal. No respiratory distress. He has no wheezes.  Lymphadenopathy:    He has no cervical adenopathy.  Skin: No rash noted.          Assessment & Plan:

## 2015-01-26 NOTE — Assessment & Plan Note (Signed)
Discussed treatment options and due to Eliquis will use Triumeq.  Labs in 3 months on new regimen.  Discussed side effects.  Counseled by pharmacist as well.

## 2015-01-26 NOTE — Progress Notes (Signed)
Patient ID: Ethan Lambert, male   DOB: [birth year], 54 y.o.   MRN: [id number] HPI: Ethan Lambert is a 54 y.o. male who is here for his routine visit for his HIV  Allergies: Allergies  Allergen Reactions  . Morphine Other (See Comments)    hallucinations    Vitals: Temp: 98.2 F (36.8 C) (08/04 0929) Temp Source: Oral (08/04 0929) BP: 146/95 mmHg (08/04 0929) Pulse Rate: 73 (08/04 0929)  Past Medical History: Past Medical History  Diagnosis Date  . Paroxysmal atrial fibrillation     CARDIOLOGIST--  DR Rayann Heman  . Hypertension   . HIV disease dx 9    MONITORED BY INFECTIOUS DISEASE -- DR Herbie Baltimore COMER  . Family history of malignant neoplasm of gastrointestinal tract   . History of CVA (cerebrovascular accident) no residuals    1999--  LEFT FRONTAL & OCCIPITAL , NON-HEMORRHAGIC  . History of nonmelanoma skin cancer   . History of kidney stones   . Right knee meniscal tear     Social History: History   Social History  . Marital Status: Married    Spouse Name: N/A  . Number of Children: 1  . Years of Education: N/A   Occupational History  . [employer] CLERK Database administrator   Social History Main Topics  . Smoking status: Never Smoker   . Smokeless tobacco: Never Used  . Alcohol Use: 0.0 oz/week    0 Standard drinks or equivalent per week     Comment: rare  . Drug Use: No  . Sexual Activity: Not on file   Other Topics Concern  . None   Social History Narrative   Pt lives in [place].  Unemployed.    Previous Regimen:   Current Regimen: ATP  Labs: HIV 1 RNA QUANT (copies/mL)  Date Value  12/21/2014 <20  05/12/2013 <20  10/28/2012 <20   CD4 T CELL ABS  Date Value  12/21/2014 700 /uL  05/12/2013 540 /uL  10/28/2012 510 cmm   HEP B S AB (no units)  Date Value  08/18/2006 No   HEPATITIS B SURFACE AG (no units)  Date Value  08/18/2006 No   HCV AB (no units)  Date Value  08/18/2006 No    CrCl: CrCl cannot be calculated (Patient  has no serum creatinine result on file.).  Lipids:    Component Value Date/Time   CHOL 175 12/21/2014 1003   TRIG 305* 12/21/2014 1003   TRIG 369 02/16/2010 0000   HDL 24* 12/21/2014 1003   CHOLHDL 7.3 12/21/2014 1003   VLDL 61* 12/21/2014 1003   LDLCALC 90 12/21/2014 1003    Assessment: 54 yo who is here for his visit. He has not been here in over a year. He is still taking his ATP well since his VL is undetectable. We are going to change him to an alternative regimen that would have less side effects. The original plan was to change him to [facility] but he has [facility] insurance so it will be an issue since they prefer Triumeq. Another issue was that he is on Eliquis for afib. This would be an issue with Genvoya or Stribild. Explained it to him. The safer route is to got with Triumeq since it would not interact with it. He is HLA neg.   Recommendations:  Dc ATP Start Triumeq 1 PO qday  Wilfred Lacy, PharmD Clinical Infectious [facility] 01/26/2015, 2:27 PM

## 2015-01-26 NOTE — Assessment & Plan Note (Signed)
bp a little high, will monitor

## 2015-01-27 ENCOUNTER — Other Ambulatory Visit: Payer: Self-pay | Admitting: *Deleted

## 2015-01-27 DIAGNOSIS — B20 Human immunodeficiency virus [HIV] disease: Secondary | ICD-10-CM

## 2015-01-27 MED ORDER — ABACAVIR-DOLUTEGRAVIR-LAMIVUD 600-50-300 MG PO TABS: 1.0000 | ORAL_TABLET | Freq: Every day | ORAL | Status: DC

## 2015-02-04 ENCOUNTER — Ambulatory Visit (INDEPENDENT_AMBULATORY_CARE_PROVIDER_SITE_OTHER): Payer: 59 | Admitting: Family Medicine

## 2015-02-04 VITALS — BP 130/80 | HR 60 | Temp 98.4°F | Resp 16 | Ht 73.0 in | Wt 248.2 lb

## 2015-02-04 DIAGNOSIS — Z7901 Long term (current) use of anticoagulants: Secondary | ICD-10-CM

## 2015-02-04 DIAGNOSIS — M5442 Lumbago with sciatica, left side: Secondary | ICD-10-CM

## 2015-02-04 MED ORDER — PREDNISONE 20 MG PO TABS: ORAL_TABLET | ORAL | Status: DC

## 2015-02-04 MED ORDER — METHOCARBAMOL 500 MG PO TABS: ORAL_TABLET | ORAL | Status: DC

## 2015-02-04 MED ORDER — HYDROCODONE-ACETAMINOPHEN 5-325 MG PO TABS: 1.0000 | ORAL_TABLET | Freq: Four times a day (QID) | ORAL | Status: DC | PRN

## 2015-02-04 NOTE — Progress Notes (Signed)
  Subjective:  Patient ID: Ethan Lambert, male    DOB: 1961/02/09  Age: 54 y.o. MRN: [id number]  54 year old gentleman who comes in here with history of having problems with pain in his back. He been doing some yard work as well as his job in the [employer]. After trimming some bushes on Thursday the pain got a lot worse. He is continued to have persistent severe left low back pain. Actually it is not always persistent but intermittent and if he tries to do anything it flares it right back up. He has some pain and numbness down his leg.  He is on several medications, including anticoagulation therapy for his atrial fibrillation and HIV medication and antihypertensives. He has been stable in his health. His viral count is nondetectable.   Objective:   Patient is fairly comfortable as long as he is sitting still, when he moves around he gets to hurting. He is able to walk with a fairly normal gait however. He is very stiff in his back. There is some mild scoliosis when looking at the back, whether this is chronic or just for muscle spasms and cannot tell. Flexion is painful as is extension. Straight leg raise test is positive on the left at about 60. Right leg is normal. Abdomen soft without mass or tenderness. He has some tenderness in the low back but is not as much tenderness is as it is tightening pain when he tries to use it.  Assessment & Plan:   Assessment:  Lumbar strain and spasm with radiculopathy Chronic anticoagulation therapy  Plan:  No diagnostic testing is needed today. Will treat symptomatically. Need to avoid NSAIDs. If not doing better with muscle relaxant and pain medication and steroids will need to reassess.  Patient Instructions  Take the prednisone 3 pills daily for 2 days, then 2 daily for 2 days, then 1 daily for 2 days. After today's dose begin taking them after breakfast on other days.  Take the methocarbamol (Robaxin) muscle relaxant one pill 3 times  daily and 2 pills at bedtime  Take the hydrocodone/APAP 5/325 one every 4-6 hours as needed for bad pain  Apply ice or heat or alternate the 2 to your low back for about 15 or 20 minutes several times daily  Stay off work through the weekend and returned to your regular shift Wednesday and less doing worse in which case she would need to come back and get rechecked if we have to extend things     HOPPER,DAVID, MD 02/04/2015

## 2015-02-04 NOTE — Patient Instructions (Signed)
Take the prednisone 3 pills daily for 2 days, then 2 daily for 2 days, then 1 daily for 2 days. After today's dose begin taking them after breakfast on other days.  Take the methocarbamol (Robaxin) muscle relaxant one pill 3 times daily and 2 pills at bedtime  Take the hydrocodone/APAP 5/325 one every 4-6 hours as needed for bad pain  Apply ice or heat or alternate the 2 to your low back for about 15 or 20 minutes several times daily  Stay off work through the weekend and returned to your regular shift Wednesday and less doing worse in which case she would need to come back and get rechecked if we have to extend things

## 2015-02-08 ENCOUNTER — Ambulatory Visit (INDEPENDENT_AMBULATORY_CARE_PROVIDER_SITE_OTHER): Payer: 59 | Admitting: Emergency Medicine

## 2015-02-08 ENCOUNTER — Ambulatory Visit (INDEPENDENT_AMBULATORY_CARE_PROVIDER_SITE_OTHER): Payer: 59

## 2015-02-08 VITALS — BP 124/74 | HR 61 | Temp 98.4°F | Resp 16 | Ht 73.0 in | Wt 250.0 lb

## 2015-02-08 DIAGNOSIS — M5442 Lumbago with sciatica, left side: Secondary | ICD-10-CM

## 2015-02-08 NOTE — Progress Notes (Addendum)
Subjective:  This chart was scribed for Arlyss Queen MD, by Tamsen Roers, at [facility].  This patient was seen in room 3 and the patient's care was started at 1:14 PM.    Patient ID: Ethan Lambert, male    DOB: 04/01/1961, 54 y.o.   MRN: [id number]  HPI  HPI Comments: Ethan Lambert is a 54 y.o. male who presents to the [facility] complaining of 8/10 back pain but states that he is now able to walk straighter after seeing Dr. Linna Darner.  Patient saw Dr. Linna Darner four days ago for back pain and was put on steroids, robaxin and hydrocodone (which he has only taken 2-3 when his pain is excruciating.) He did not have any x-rays of his back and states that he has had back pain in the past but it has never been like this.  He was picking up sticks and doing other yard work activities about 10 days ago which he thinks is the reason for his pain.  Patient has a history of atrial fibrillation and takes Eliquis.   Past medical history: HIV (undetecable- goes every 6 months), atrial fibrillation, hypertension.   Patient Active Problem List   Diagnosis Date Noted  . Fatigue 01/26/2015  . S/P right knee arthroscopy 12/31/2013  . INGUINAL HERNIORRHAPHIES, BILATERAL, HX OF 01/15/2010  . VARICOCELE 07/05/2009  . HEMATOSPERMIA 06/27/2009  . Essential hypertension 12/08/2008  . Atrial fibrillation 12/08/2008  . Cerebral artery occlusion with cerebral infarction 12/08/2008  . Human immunodeficiency virus disease 11/18/2006  . TENDINITIS, ELBOW 11/18/2006   Past Medical History  Diagnosis Date  . Paroxysmal atrial fibrillation     CARDIOLOGIST--  DR Rayann Heman  . Hypertension   . HIV disease dx 63    MONITORED BY INFECTIOUS DISEASE -- DR Herbie Baltimore COMER  . Family history of malignant neoplasm of gastrointestinal tract   . History of CVA (cerebrovascular accident) no residuals    1999--  LEFT FRONTAL & OCCIPITAL , NON-HEMORRHAGIC  . History of nonmelanoma  skin cancer   . History of kidney stones   . Right knee meniscal tear   . Anemia   . Blood transfusion without reported diagnosis    Past Surgical History  Procedure Laterality Date  . Appendectomy  06-09-2003  . Laparoscopic cholecystectomy  07-08-2003  . Inguinal hernia repair Bilateral 1990's  . Laser ablation anal condyloma  05-18-2001  . Transthoracic echocardiogram  12-01-2008    MODERATE LVH/  EF 55-60%/  MILD MR/  NEGATIVE BUBBLE STUDY  . Negative sleep study  01-03-2012  in epic  . Knee arthroscopy with medial menisectomy Right 12/31/2013    Procedure: RIGHT KNEE ARTHROSCOPY PARTIAL MEDIAL MENISCECTOMY DEBRIDEMENT AND CHONDROPLASTY ;  Surgeon: Sydnee Cabal, MD;  Location: [place];  Service: Orthopedics;  Laterality: Right;  . Hernia repair     Allergies  Allergen Reactions  . Morphine Other (See Comments)    hallucinations   Prior to Admission medications   Medication Sig Start Date End Date Taking? Authorizing Provider  diltiazem (TIAZAC) 360 MG 24 hr capsule Take 1 capsule (360 mg total) by mouth daily. 12/28/14  Yes Thompson Grayer, MD  efavirenz-emtricitabine-tenofovir (ATRIPLA) 600-200-300 MG per tablet Take 1 tablet by mouth at bedtime.   Yes Historical Provider, MD  ELIQUIS 5 MG TABS tablet Take 1 tablet by mouth  twice a day 04/22/14  Yes Thompson Grayer, MD  flecainide (TAMBOCOR) 150 MG tablet Take 300 mg  by mouth 2 (two) times daily as needed (A FIB).   Yes Historical Provider, MD  HYDROcodone-acetaminophen (NORCO) 5-325 MG per tablet Take 1 tablet by mouth every 6 (six) hours as needed. 02/04/15  Yes Posey Boyer, MD  methocarbamol (ROBAXIN) 500 MG tablet Take one pill 3 times daily and 2 at bedtime for muscle relaxation 02/04/15  Yes Posey Boyer, MD  predniSONE (DELTASONE) 20 MG tablet Take 3 daily for 2 days, then 2 daily for 2 days, then 1 daily for 2 days 02/04/15  Yes Posey Boyer, MD  Abacavir-Dolutegravir-Lamivud 600-50-300 MG TABS Take 1  tablet by mouth daily. Patient not taking: Reported on 02/04/2015 01/27/15   Thayer Headings, MD  Omega-3 Fatty Acids (FISH OIL) 1000 MG CAPS Take 1,000 mg by mouth daily.    Historical Provider, MD   Social History   Social History  . Marital Status: Married    Spouse Name: N/A  . Number of Children: 1  . Years of Education: N/A   Occupational History  . [employer] CLERK Database administrator   Social History Main Topics  . Smoking status: Never Smoker   . Smokeless tobacco: Never Used  . Alcohol Use: 0.0 oz/week    0 Standard drinks or equivalent per week     Comment: rare  . Drug Use: No  . Sexual Activity: Not on file   Other Topics Concern  . Not on file   Social History Narrative   Pt lives in [place].  Unemployed.      Review of Systems  Constitutional: Negative for fever and chills.  Eyes: Negative for pain, discharge, redness and itching.  Gastrointestinal: Negative for nausea and vomiting.  Musculoskeletal: Positive for back pain. Negative for neck pain and neck stiffness.  Allergic/Immunologic: Positive for immunocompromised state.  Neurological: Negative for seizures and speech difficulty.       Objective:   Physical Exam CONSTITUTIONAL: Well developed/well nourished HEAD: Normocephalic/atraumatic EYES: EOMI/PERRL ENMT: Mucous membranes moist NECK: supple no meningeal signs SPINE/BACK:Tender over LS spine on the left. Straight leg positive at 45 degrees with patient seated.  Motor strength is 5/5.   CV: S1/S2 noted, no murmurs/rubs/gallops noted LUNGS: Lungs are clear to auscultation bilaterally, no apparent distress ABDOMEN: soft, nontender, no rebound or guarding, bowel sounds noted throughout abdomen GU:no cva tenderness NEURO: Pt is awake/alert/appropriate, moves all extremitiesx4.  No facial droop.   SKIN: warm, color normal PSYCH: no abnormalities of mood noted, alert and oriented to situation [facility] reading (PRIMARY) by  Dr. Everlene Farrier please  comment on pedicle L2 and the vertebral body L2.Danley Danker Vitals:   02/08/15 1259  BP: 124/74  Pulse: 61  Temp: 98.4 F (36.9 C)  TempSrc: Oral  Resp: 16  Height: 6\' 1"  (1.854 m)  Weight: 250 lb (113.399 kg)  SpO2: 97%       Assessment & Plan:  We'll schedule MRI the lumbar spine to evaluate abnormal area L2. Leave him out of work until Monday for now. No change in medications the radiologist felt these changes noted at L2 are chronic in nature. I personally performed the services described in this documentation, which was scribed in my presence. The recorded information has been reviewed and is accurate.  Nena Jordan, MD

## 2015-02-12 ENCOUNTER — Other Ambulatory Visit: Payer: Self-pay | Admitting: Internal Medicine

## 2015-02-15 ENCOUNTER — Ambulatory Visit (INDEPENDENT_AMBULATORY_CARE_PROVIDER_SITE_OTHER): Payer: 59 | Admitting: Emergency Medicine

## 2015-02-15 VITALS — BP 129/80 | HR 71 | Temp 98.4°F | Resp 16 | Ht 73.0 in | Wt 248.0 lb

## 2015-02-15 DIAGNOSIS — M5442 Lumbago with sciatica, left side: Secondary | ICD-10-CM

## 2015-02-15 NOTE — Progress Notes (Signed)
Subjective:  This chart was scribed for Arlyss Queen MD, by Tamsen Roers, at [facility].  This patient was seen in room 8 and the patient's care was started at 11:07 AM.    Patient ID: Ethan Lambert, male    DOB: [birth year], 54 y.o.   MRN: [id number] Chief Complaint  Patient presents with  . Follow-up    Left-sided low back pain with sciatica    HPI  HPI Comments: Ethan Lambert is a 54 y.o. male who presents to the [facility] for a follow up regarding his left sided lower back pain.  He is here to have paper work completed for his job (he will be going back in two days).  He states that he is now doing better since his last visit and has (4/10) pain.  Recently, he stepped in a hole in his yard and felt a small "jolt" of sharp pain for a moment.  He still has tightness but is now able to bend over and tie his shoes but still has moments of sharp pains which doing certain movements. He has only taken 2 pain medications and is no longer taking steroids.  He will continue to take acetminophen and muscle relaxer's. He has no other concerns today.   Patients MRI has been scheduled and he will be going in either this weekend of next week on Monday or Tuesday.     Patient Active Problem List   Diagnosis Date Noted  . Fatigue 01/26/2015  . S/P right knee arthroscopy 12/31/2013  . INGUINAL HERNIORRHAPHIES, BILATERAL, HX OF 01/15/2010  . VARICOCELE 07/05/2009  . HEMATOSPERMIA 06/27/2009  . Essential hypertension 12/08/2008  . Atrial fibrillation 12/08/2008  . Cerebral artery occlusion with cerebral infarction 12/08/2008  . Human immunodeficiency virus disease 11/18/2006  . TENDINITIS, ELBOW 11/18/2006   Past Medical History  Diagnosis Date  . Paroxysmal atrial fibrillation     CARDIOLOGIST--  DR Rayann Heman  . Hypertension   . HIV disease dx 46    MONITORED BY INFECTIOUS DISEASE -- DR Herbie Baltimore COMER  . Family history of malignant neoplasm of  gastrointestinal tract   . History of CVA (cerebrovascular accident) no residuals    1999--  LEFT FRONTAL & OCCIPITAL , NON-HEMORRHAGIC  . History of nonmelanoma skin cancer   . History of kidney stones   . Right knee meniscal tear   . Anemia   . Blood transfusion without reported diagnosis    Past Surgical History  Procedure Laterality Date  . Appendectomy  06-09-2003  . Laparoscopic cholecystectomy  07-08-2003  . Inguinal hernia repair Bilateral 1990's  . Laser ablation anal condyloma  05-18-2001  . Transthoracic echocardiogram  12-01-2008    MODERATE LVH/  EF 55-60%/  MILD MR/  NEGATIVE BUBBLE STUDY  . Negative sleep study  01-03-2012  in epic  . Knee arthroscopy with medial menisectomy Right 12/31/2013    Procedure: RIGHT KNEE ARTHROSCOPY PARTIAL MEDIAL MENISCECTOMY DEBRIDEMENT AND CHONDROPLASTY ;  Surgeon: Sydnee Cabal, MD;  Location: [place];  Service: Orthopedics;  Laterality: Right;  . Hernia repair     Allergies  Allergen Reactions  . Morphine Other (See Comments)    hallucinations   Prior to Admission medications   Medication Sig Start Date End Date Taking? Authorizing Provider  ATRIPLA [phone] MG per tablet TAKE 1 TABLET BY MOUTH AT BEDTIME. 02/13/15  Yes Truman Hayward, MD  diltiazem [facility]) 360 MG 24 hr capsule  Take 1 capsule (360 mg total) by mouth daily. 12/28/14  Yes Thompson Grayer, MD  efavirenz-emtricitabine-tenofovir (ATRIPLA) 600-200-300 MG per tablet Take 1 tablet by mouth at bedtime.   Yes Historical Provider, MD  ELIQUIS 5 MG TABS tablet Take 1 tablet by mouth  twice a day 04/22/14  Yes Thompson Grayer, MD  flecainide (TAMBOCOR) 150 MG tablet Take 300 mg by mouth 2 (two) times daily as needed (A FIB).   Yes Historical Provider, MD  HYDROcodone-acetaminophen (NORCO) 5-325 MG per tablet Take 1 tablet by mouth every 6 (six) hours as needed. 02/04/15  Yes Posey Boyer, MD  methocarbamol (ROBAXIN) 500 MG tablet Take one pill 3 times daily  and 2 at bedtime for muscle relaxation 02/04/15  Yes Posey Boyer, MD  Omega-3 Fatty Acids (FISH OIL) 1000 MG CAPS Take 1,000 mg by mouth daily.   Yes Historical Provider, MD  predniSONE (DELTASONE) 20 MG tablet Take 3 daily for 2 days, then 2 daily for 2 days, then 1 daily for 2 days 02/04/15  Yes Posey Boyer, MD  Abacavir-Dolutegravir-Lamivud 600-50-300 MG TABS Take 1 tablet by mouth daily. Patient not taking: Reported on 02/04/2015 01/27/15   Thayer Headings, MD   Social History   Social History  . Marital Status: Married    Spouse Name: N/A  . Number of Children: 1  . 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 Smoking status: Never Smoker   . Smokeless tobacco: Never Used  . Alcohol Use: 0.0 oz/week    0 Standard drinks or equivalent per week     Comment: rare  . Drug Use: No  . Sexual Activity: Not on file   Other Topics Concern  . Not on file   Social History Narrative   Pt lives in [place].  Unemployed.         Review of Systems  Constitutional: Negative for fever and chills.  Respiratory: Negative for cough, choking and shortness of breath.   Gastrointestinal: Negative for nausea and vomiting.  Musculoskeletal: Positive for back pain. Negative for neck pain and neck stiffness.       Objective:   Physical Exam HEAD: Normocephalic/atraumatic NECK: supple no meningeal signs SPINE/BACK:tender L5 S1 on the left. His straight leg raising is positive on the left at 75 degrees.   CV: S1/S2 noted, no murmurs/rubs/gallops noted LUNGS: Lungs are clear to auscultation bilaterally, no apparent distress ABDOMEN: soft, nontender, no rebound or guarding, bowel sounds noted throughout abdomen GU:no cva tenderness NEURO: Pt is awake/alert/appropriate, moves all extremitiesx4.  No facial droop.   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 He will continue on his current medication. He has finished his prednisone and will stay on Tylenol and muscle relaxants. He is scheduled for an MRI on Tuesday but will try and move that to Saturday. He will remain out of work until follow-up visit on Monday. He will see Dr. Linna Darner at that time.I personally performed the services described in this documentation,  which was scribed in my presence. The recorded information has been reviewed and is accurate.  Nena Jordan, MD

## 2015-02-16 ENCOUNTER — Telehealth: Payer: Self-pay | Admitting: *Deleted

## 2015-02-16 NOTE — Telephone Encounter (Signed)
LMOM for pt to call back with a fax number to fax his [employer] for [employer] form

## 2015-02-18 ENCOUNTER — Ambulatory Visit
Admission: RE | Admit: 2015-02-18 | Discharge: 2015-02-18 | Disposition: A | Discharge status: Home / Self Care | Payer: 59 | Source: Ambulatory Visit | Attending: Emergency Medicine | Admitting: Emergency Medicine

## 2015-02-18 DIAGNOSIS — M5442 Lumbago with sciatica, left side: Secondary | ICD-10-CM

## 2015-02-20 ENCOUNTER — Ambulatory Visit (INDEPENDENT_AMBULATORY_CARE_PROVIDER_SITE_OTHER): Payer: 59 | Admitting: Emergency Medicine

## 2015-02-20 VITALS — BP 130/80 | HR 67 | Temp 98.4°F | Resp 16 | Ht 73.0 in | Wt 253.0 lb

## 2015-02-20 DIAGNOSIS — M5432 Sciatica, left side: Secondary | ICD-10-CM | POA: Diagnosis not present

## 2015-02-20 NOTE — Patient Instructions (Signed)

## 2015-02-20 NOTE — Progress Notes (Signed)
Subjective:  Patient ID: Ethan Lambert, male    DOB: 07-09-1960  Age: 54 y.o. MRN: [id number]  CC: Follow-up   HPI DRYSTAN READER presents  for review of his MRI. He was treated for back pain since 02/04/2015. He underwent an MRI over the weekend and is committed to review the results. Has persistent left sided radicular symptoms. He is comfortable with medications at work and has had minimal improvement. No numbness tingling or weakness in his leg  History Cassidy has a past medical history of Paroxysmal atrial fibrillation; Hypertension; HIV disease (dx 1999); Family history of malignant neoplasm of gastrointestinal tract; History of CVA (cerebrovascular accident) (no residuals); History of nonmelanoma skin cancer; History of kidney stones; Right knee meniscal tear; Anemia; and Blood transfusion without reported diagnosis.   He has past surgical history that includes Appendectomy (06-09-2003); Laparoscopic cholecystectomy (07-08-2003); Inguinal hernia repair (Bilateral, 1990's); LASER ABLATION ANAL CONDYLOMA (05-18-2001); transthoracic echocardiogram (12-01-2008); NEGATIVE SLEEP STUDY (01-03-2012  in epic); Knee arthroscopy with medial menisectomy (Right, 12/31/2013); and Hernia repair.   His  family history includes Arrhythmia in his father; Colon cancer in his maternal grandfather; Heart attack in his brother; Hyperlipidemia in his mother; Hypertension in his mother; Lung cancer in his mother; Other in his maternal grandmother; Prostate cancer in his maternal grandfather.  He   reports that he has never smoked. He has never used smokeless tobacco. He reports that he drinks alcohol. He reports that he does not use illicit drugs.  Outpatient Prescriptions Prior to Visit  Medication Sig Dispense Refill  . Abacavir-Dolutegravir-Lamivud 600-50-300 MG TABS Take 1 tablet by mouth daily. 30 tablet 5  . ATRIPLA 600-200-300 MG per tablet TAKE 1 TABLET BY MOUTH AT BEDTIME. 30 tablet 4  .  diltiazem (TIAZAC) 360 MG 24 hr capsule Take 1 capsule (360 mg total) by mouth daily. 90 capsule 3  . efavirenz-emtricitabine-tenofovir (ATRIPLA) 600-200-300 MG per tablet Take 1 tablet by mouth at bedtime.    [name] ELIQUIS 5 MG TABS tablet Take 1 tablet by mouth  twice a day 180 tablet 2  . flecainide (TAMBOCOR) 150 MG tablet Take 300 mg by mouth 2 (two) times daily as needed (A FIB).    [name] HYDROcodone-acetaminophen (NORCO) 5-325 MG per tablet Take 1 tablet by mouth every 6 (six) hours as needed. 20 tablet 0  . methocarbamol (ROBAXIN) 500 MG tablet Take one pill 3 times daily and 2 at bedtime for muscle relaxation 40 tablet 0  . Omega-3 Fatty Acids (FISH OIL) 1000 MG CAPS Take 1,000 mg by mouth daily.    . predniSONE (DELTASONE) 20 MG tablet Take 3 daily for 2 days, then 2 daily for 2 days, then 1 daily for 2 days 12 tablet 0   No facility-administered medications prior to visit.    Social History   Social History  . Marital Status: Married    Spouse Name: N/A  . Number of Children: 1  . 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 Smoking status: Never Smoker   . Smokeless tobacco: Never Used  . Alcohol Use: 0.0 oz/week    0 Standard drinks or equivalent per week     Comment: rare  . Drug Use: No  . Sexual Activity: Not Asked   Other Topics Concern  . None   Social History Narrative   Pt lives in [place].  Unemployed.     Review of  Systems  Constitutional: Negative for fever, chills and appetite change.  HENT: Negative for congestion, ear pain, postnasal drip, sinus pressure and sore throat.   Eyes: Negative for pain and redness.  Respiratory: Negative for cough, shortness of breath and wheezing.   Cardiovascular: Negative for leg swelling.  Gastrointestinal: Negative for nausea, vomiting, abdominal pain, diarrhea, constipation and blood in stool.  Endocrine: Negative for polyuria.    Genitourinary: Negative for dysuria, urgency, frequency and flank pain.  Musculoskeletal: Negative for gait problem.  Skin: Negative for rash.  Neurological: Negative for weakness and headaches.  Psychiatric/Behavioral: Negative for confusion and decreased concentration. The patient is not nervous/anxious.     Objective:  BP 130/80 mmHg  Pulse 67  Temp(Src) 98.4 F (36.9 C) (Oral)  Resp 16  Ht 6\' 1"  (1.854 m)  Wt 253 lb (114.76 kg)  BMI 33.39 kg/m2  SpO2 98%  Physical Exam  Constitutional: He is oriented to person, place, and time. He appears well-developed and well-nourished.  HENT:  Head: Normocephalic and atraumatic.  Eyes: Conjunctivae are normal. Pupils are equal, round, and reactive to light.  Pulmonary/Chest: Effort normal.  Musculoskeletal: He exhibits no edema.  Neurological: He is alert and oriented to person, place, and time.  Skin: Skin is dry.  Psychiatric: He has a normal mood and affect. His behavior is normal. Thought content normal.      Assessment & Plan:   [place] was seen today for follow-up.  Diagnoses and all orders for this visit:  Sciatic neuritis, left -     Ambulatory referral to Neurosurgery   I am having Mr. Duke maintain his ELIQUIS, flecainide, Fish Oil, diltiazem, Abacavir-Dolutegravir-Lamivud, efavirenz-emtricitabine-tenofovir, predniSONE, HYDROcodone-acetaminophen, methocarbamol, and ATRIPLA.  No orders of the defined types were placed in this encounter.    Appropriate red flag conditions were discussed with the patient as well as actions that should be taken.  Patient expressed his understanding.  Follow-up: Return if symptoms worsen or fail to improve.  Roselee Culver, MD

## 2015-02-21 ENCOUNTER — Other Ambulatory Visit: Payer: Self-pay

## 2015-02-23 ENCOUNTER — Telehealth: Payer: Self-pay | Admitting: Emergency Medicine

## 2015-02-23 NOTE — Telephone Encounter (Signed)
Patient dropped off FMLA forms on 02/16/2015. I have completed some parts of it but it needs review and signature from you. I will bring these down to 104 today. The forms are due tomorrow 02/24/2015.

## 2015-02-24 ENCOUNTER — Telehealth: Payer: Self-pay | Admitting: Emergency Medicine

## 2015-02-24 NOTE — Telephone Encounter (Signed)
Forms received from Dr. Everlene Farrier. Patient notified he can pick them up from the walk in center.

## 2015-03-01 ENCOUNTER — Telehealth: Payer: Self-pay | Admitting: Internal Medicine

## 2015-03-01 NOTE — Telephone Encounter (Signed)
New message      Request for surgical clearance:  1. What type of surgery is being performed? Back surgery  2. When is this surgery scheduled?  Pending clearance  3. Are there any medications that need to be held prior to surgery and how long? Medical clearance ----not sure of medications to hold  4. Name of physician performing surgery? Dr Lucille Passy  5. What is your office phone and fax number?  Fax [phone]  6. Clearance was faxed to our office 02-22-15

## 2015-03-06 NOTE — Telephone Encounter (Signed)
Follow up      Pt is calling to see if we received clearance for surgery fax from Dr Lucille Passy? It was faxed twice prior to calling on the 7th.  Pt is out of work and need an update on this clearance.  Please call and let him know if we received the fax and how long it will take to get clearance back to surgeon.

## 2015-03-06 NOTE — Telephone Encounter (Signed)
Advised pt that we did receive a phone call from Dr. Ennis Forts office in regards to his surgery. Advised pt that information was sent to Dr. Rayann Heman and is just awaiting his clearance. Informed pt that I would send this information to Dr. Jackalyn Lombard nurse, Claiborne Billings, for follow up. Pt verbalized understanding and was appreciative for call.

## 2015-03-09 ENCOUNTER — Other Ambulatory Visit: Payer: Self-pay | Admitting: Neurosurgery

## 2015-03-09 NOTE — Telephone Encounter (Signed)
Proceed with surgery if medically indicated per Dr Rayann Heman.  Hold Eliquis 3 days prior to surgery

## 2015-03-14 ENCOUNTER — Encounter (HOSPITAL_COMMUNITY)
Admission: RE | Admit: 2015-03-14 | Discharge: 2015-03-14 | Disposition: A | Discharge status: Home / Self Care | Payer: 59 | Source: Ambulatory Visit | Attending: Neurosurgery | Admitting: Neurosurgery

## 2015-03-14 ENCOUNTER — Encounter (HOSPITAL_COMMUNITY): Payer: Self-pay

## 2015-03-14 DIAGNOSIS — Z85828 Personal history of other malignant neoplasm of skin: Secondary | ICD-10-CM | POA: Diagnosis not present

## 2015-03-14 DIAGNOSIS — Z8673 Personal history of transient ischemic attack (TIA), and cerebral infarction without residual deficits: Secondary | ICD-10-CM | POA: Diagnosis not present

## 2015-03-14 DIAGNOSIS — Z7901 Long term (current) use of anticoagulants: Secondary | ICD-10-CM | POA: Diagnosis not present

## 2015-03-14 DIAGNOSIS — Z885 Allergy status to narcotic agent status: Secondary | ICD-10-CM | POA: Diagnosis not present

## 2015-03-14 DIAGNOSIS — I10 Essential (primary) hypertension: Secondary | ICD-10-CM | POA: Diagnosis not present

## 2015-03-14 DIAGNOSIS — I480 Paroxysmal atrial fibrillation: Secondary | ICD-10-CM | POA: Diagnosis not present

## 2015-03-14 DIAGNOSIS — M5126 Other intervertebral disc displacement, lumbar region: Secondary | ICD-10-CM | POA: Diagnosis present

## 2015-03-14 HISTORY — DX: Cardiac arrhythmia, unspecified: I49.9

## 2015-03-14 LAB — CBC
HCT: 43.9 % (ref 39.0–52.0)
Hemoglobin: 15.2 g/dL (ref 13.0–17.0)
MCH: 30.8 pg (ref 26.0–34.0)
MCHC: 34.6 g/dL (ref 30.0–36.0)
MCV: 89 fL (ref 78.0–100.0)
Platelets: 196 10*3/uL (ref 150–400)
RBC: 4.93 MIL/uL (ref 4.22–5.81)
RDW: 13.7 % (ref 11.5–15.5)
WBC: 6.1 10*3/uL (ref 4.0–10.5)

## 2015-03-14 LAB — BASIC METABOLIC PANEL
Anion gap: 9 (ref 5–15)
BUN: 15 mg/dL (ref 6–20)
CO2: 22 mmol/L (ref 22–32)
Calcium: 9.5 mg/dL (ref 8.9–10.3)
Chloride: 108 mmol/L (ref 101–111)
Creatinine, Ser: 1.02 mg/dL (ref 0.61–1.24)
GFR calc Af Amer: >60 mL/min (ref >60)
GFR calc non Af Amer: >60 mL/min (ref >60)
Glucose, Bld: 110 mg/dL — ABNORMAL HIGH (ref 65–99)
Potassium: 4.1 mmol/L (ref 3.5–5.1)
Sodium: 139 mmol/L (ref 135–145)

## 2015-03-14 LAB — SURGICAL PCR SCREEN
MRSA, PCR: NEGATIVE
Staphylococcus aureus: POSITIVE — AB

## 2015-03-14 MED ORDER — CEFAZOLIN SODIUM-DEXTROSE 2-3 GM-% IV SOLR
2.0000 g | INTRAVENOUS | Status: AC
  Administered 2015-03-15: 2 g via INTRAVENOUS

## 2015-03-14 NOTE — Progress Notes (Signed)
error 

## 2015-03-14 NOTE — Progress Notes (Signed)
Anesthesia Chart Review: Patient is a 54 year old male scheduled for left L5-S1 microdiskectomy on 03/15/15 by Dr. Kathyrn Sheriff.  History includes non-smoker, PAF, HTN, HIV (diagnosed '99), CVA '99, skin cancer (non-melanoma), nephrolithiasis, cholecystectomy, appendectomy. BMI is consistent with obesity. No PCP is listed. Has visits with [facility]. ID is Dr. Linus Salmons.   Cardiologist is Dr. Rayann Heman. Telephone encounter per Janan Halter, RN with [facility], "Proceed with surgery if medically indicated per Dr Rayann Heman. Hold Eliquis 3 days prior to surgery."  Meds include Abacavir-Dolutegravir-Lamivud, diltiazem, Eliquis (held 03/10/15), flecainide, Norco.  12/28/14 EKG: NSR, LAD, pulmonary disease pattern.  08/24/09 ETT: Normal - no evidence of ischemia by ST analysis.  12/01/08 Echo: Study Conclusions: 1. Left ventricle: The cavity size was normal. Wall thickness was increased in a pattern of moderate LVH. Systolic function was normal. The estimated ejection fraction was in the range of 55% to 60%. 2. Mitral valve: Mild regurgitation. 3. Impressions: Bubble study negative for right to left shunt  08/30/14 CXR: IMPRESSION: Minimal atelectasis at the left lung base. Otherwise clear lungs.   Preoperative labs noted. LFTs WNL within the past three months.   If no acute changes then I anticipate that he can proceed as planned.  George Hugh [facility] Short Stay Center/Anesthesiology Phone [phone] 03/14/2015 1:57 PM

## 2015-03-15 ENCOUNTER — Ambulatory Visit (HOSPITAL_COMMUNITY): Payer: 59 | Admitting: Vascular Surgery

## 2015-03-15 ENCOUNTER — Ambulatory Visit (HOSPITAL_COMMUNITY): Payer: 59

## 2015-03-15 ENCOUNTER — Observation Stay (HOSPITAL_COMMUNITY)
Admission: RE | Admit: 2015-03-15 | Discharge: 2015-03-16 | Disposition: A | Discharge status: Home / Self Care | Payer: 59 | Source: Ambulatory Visit | Attending: Neurosurgery | Admitting: Neurosurgery

## 2015-03-15 ENCOUNTER — Encounter (HOSPITAL_COMMUNITY): Payer: Self-pay | Admitting: *Deleted

## 2015-03-15 ENCOUNTER — Ambulatory Visit (HOSPITAL_COMMUNITY): Payer: 59 | Admitting: Anesthesiology

## 2015-03-15 ENCOUNTER — Encounter (HOSPITAL_COMMUNITY)
Admission: RE | Disposition: A | Discharge status: Home / Self Care | Payer: Self-pay | Source: Ambulatory Visit | Attending: Neurosurgery

## 2015-03-15 DIAGNOSIS — I10 Essential (primary) hypertension: Secondary | ICD-10-CM | POA: Insufficient documentation

## 2015-03-15 DIAGNOSIS — Z885 Allergy status to narcotic agent status: Secondary | ICD-10-CM | POA: Insufficient documentation

## 2015-03-15 DIAGNOSIS — M5126 Other intervertebral disc displacement, lumbar region: Principal | ICD-10-CM | POA: Diagnosis present

## 2015-03-15 DIAGNOSIS — I480 Paroxysmal atrial fibrillation: Secondary | ICD-10-CM | POA: Insufficient documentation

## 2015-03-15 DIAGNOSIS — Z419 Encounter for procedure for purposes other than remedying health state, unspecified: Secondary | ICD-10-CM

## 2015-03-15 DIAGNOSIS — Z85828 Personal history of other malignant neoplasm of skin: Secondary | ICD-10-CM | POA: Insufficient documentation

## 2015-03-15 DIAGNOSIS — Z8673 Personal history of transient ischemic attack (TIA), and cerebral infarction without residual deficits: Secondary | ICD-10-CM | POA: Insufficient documentation

## 2015-03-15 DIAGNOSIS — Z7901 Long term (current) use of anticoagulants: Secondary | ICD-10-CM | POA: Insufficient documentation

## 2015-03-15 HISTORY — PX: LUMBAR LAMINECTOMY/DECOMPRESSION MICRODISCECTOMY: SHX5026

## 2015-03-15 SURGERY — LUMBAR LAMINECTOMY/DECOMPRESSION MICRODISCECTOMY 1 LEVEL
Anesthesia: General | Laterality: Left

## 2015-03-15 MED ORDER — ONDANSETRON HCL 4 MG/2ML IJ SOLN: 4.0000 mg | INTRAMUSCULAR | Status: DC | PRN

## 2015-03-15 MED ORDER — BISACODYL 10 MG RE SUPP: 10.0000 mg | Freq: Every day | RECTAL | Status: DC | PRN

## 2015-03-15 MED ORDER — LIDOCAINE HCL (CARDIAC) 20 MG/ML IV SOLN
INTRAVENOUS | Status: AC
  Filled 2015-03-15: qty 5

## 2015-03-15 MED ORDER — ABACAVIR SULFATE 300 MG PO TABS
600.0000 mg | ORAL_TABLET | Freq: Every day | ORAL | Status: DC
  Filled 2015-03-15 (×2): qty 2

## 2015-03-15 MED ORDER — DOLUTEGRAVIR SODIUM 50 MG PO TABS
50.0000 mg | ORAL_TABLET | Freq: Every day | ORAL | Status: DC
  Filled 2015-03-15: qty 1

## 2015-03-15 MED ORDER — OXYCODONE-ACETAMINOPHEN 5-325 MG PO TABS
1.0000 | ORAL_TABLET | ORAL | Status: DC | PRN
  Administered 2015-03-15 (×2): 1 via ORAL
  Filled 2015-03-15 (×2): qty 1

## 2015-03-15 MED ORDER — SODIUM CHLORIDE 0.9 % IJ SOLN: 3.0000 mL | INTRAMUSCULAR | Status: DC | PRN

## 2015-03-15 MED ORDER — ACETAMINOPHEN 325 MG PO TABS: 650.0000 mg | ORAL_TABLET | ORAL | Status: DC | PRN

## 2015-03-15 MED ORDER — DOLUTEGRAVIR SODIUM 50 MG PO TABS
50.0000 mg | ORAL_TABLET | Freq: Every day | ORAL | Status: DC
  Filled 2015-03-15 (×2): qty 1

## 2015-03-15 MED ORDER — ONDANSETRON HCL 4 MG/2ML IJ SOLN
INTRAMUSCULAR | Status: AC
  Filled 2015-03-15: qty 2

## 2015-03-15 MED ORDER — BUPIVACAINE HCL (PF) 0.5 % IJ SOLN
INTRAMUSCULAR | Status: DC | PRN
  Administered 2015-03-15: 6 mL

## 2015-03-15 MED ORDER — LAMIVUDINE 150 MG PO TABS
300.0000 mg | ORAL_TABLET | Freq: Every day | ORAL | Status: DC
  Filled 2015-03-15 (×2): qty 2

## 2015-03-15 MED ORDER — PHENOL 1.4 % MT LIQD: 1.0000 | OROMUCOSAL | Status: DC | PRN

## 2015-03-15 MED ORDER — MUPIROCIN 2 % EX OINT
1.0000 "application " | TOPICAL_OINTMENT | Freq: Two times a day (BID) | CUTANEOUS | Status: DC
  Administered 2015-03-15: 1 via NASAL

## 2015-03-15 MED ORDER — LACTATED RINGERS IV SOLN
INTRAVENOUS | Status: DC
  Administered 2015-03-15: 09:00:00 via INTRAVENOUS

## 2015-03-15 MED ORDER — HYDROCODONE-ACETAMINOPHEN 5-325 MG PO TABS: 1.0000 | ORAL_TABLET | Freq: Four times a day (QID) | ORAL | Status: DC | PRN

## 2015-03-15 MED ORDER — SODIUM CHLORIDE 0.9 % IJ SOLN
3.0000 mL | Freq: Two times a day (BID) | INTRAMUSCULAR | Status: DC
  Administered 2015-03-15: 3 mL via INTRAVENOUS

## 2015-03-15 MED ORDER — DOCUSATE SODIUM 100 MG PO CAPS
100.0000 mg | ORAL_CAPSULE | Freq: Two times a day (BID) | ORAL | Status: DC
  Administered 2015-03-15 (×2): 100 mg via ORAL
  Filled 2015-03-15 (×2): qty 1

## 2015-03-15 MED ORDER — GELATIN ABSORBABLE MT POWD
OROMUCOSAL | Status: DC | PRN
  Administered 2015-03-15: 11:00:00 via TOPICAL

## 2015-03-15 MED ORDER — PROPOFOL 10 MG/ML IV BOLUS
INTRAVENOUS | Status: DC | PRN
  Administered 2015-03-15: 200 mg via INTRAVENOUS

## 2015-03-15 MED ORDER — GLYCOPYRROLATE 0.2 MG/ML IJ SOLN
INTRAMUSCULAR | Status: DC | PRN
  Administered 2015-03-15: 0.4 mg via INTRAVENOUS

## 2015-03-15 MED ORDER — ROCURONIUM BROMIDE 100 MG/10ML IV SOLN
INTRAVENOUS | Status: DC | PRN
  Administered 2015-03-15: 50 mg via INTRAVENOUS
  Administered 2015-03-15: 20 mg via INTRAVENOUS

## 2015-03-15 MED ORDER — FLECAINIDE ACETATE 100 MG PO TABS
300.0000 mg | ORAL_TABLET | Freq: Two times a day (BID) | ORAL | Status: DC | PRN
  Filled 2015-03-15: qty 3

## 2015-03-15 MED ORDER — ABACAVIR-DOLUTEGRAVIR-LAMIVUD 600-50-300 MG PO TABS: 1.0000 | ORAL_TABLET | Freq: Every day | ORAL | Status: DC

## 2015-03-15 MED ORDER — ROCURONIUM BROMIDE 50 MG/5ML IV SOLN
INTRAVENOUS | Status: AC
  Filled 2015-03-15: qty 1

## 2015-03-15 MED ORDER — CEFAZOLIN SODIUM-DEXTROSE 2-3 GM-% IV SOLR
INTRAVENOUS | Status: AC
  Filled 2015-03-15: qty 50

## 2015-03-15 MED ORDER — ONDANSETRON HCL 4 MG/2ML IJ SOLN
INTRAMUSCULAR | Status: DC | PRN
  Administered 2015-03-15: 4 mg via INTRAVENOUS

## 2015-03-15 MED ORDER — DEXAMETHASONE SODIUM PHOSPHATE 4 MG/ML IJ SOLN
INTRAMUSCULAR | Status: DC | PRN
  Administered 2015-03-15: 4 mg via INTRAVENOUS

## 2015-03-15 MED ORDER — MIDAZOLAM HCL 2 MG/2ML IJ SOLN
INTRAMUSCULAR | Status: AC
  Filled 2015-03-15: qty 4

## 2015-03-15 MED ORDER — ABACAVIR SULFATE-LAMIVUDINE 600-300 MG PO TABS
1.0000 | ORAL_TABLET | Freq: Every day | ORAL | Status: DC
  Filled 2015-03-15: qty 1

## 2015-03-15 MED ORDER — LIDOCAINE-EPINEPHRINE 1 %-1:100000 IJ SOLN
INTRAMUSCULAR | Status: DC | PRN
  Administered 2015-03-15: 6 mL

## 2015-03-15 MED ORDER — CEFAZOLIN SODIUM-DEXTROSE 2-3 GM-% IV SOLR
2.0000 g | Freq: Three times a day (TID) | INTRAVENOUS | Status: AC
  Administered 2015-03-15 (×2): 2 g via INTRAVENOUS
  Filled 2015-03-15 (×3): qty 50

## 2015-03-15 MED ORDER — THROMBIN 5000 UNITS EX SOLR
CUTANEOUS | Status: DC | PRN
  Administered 2015-03-15 (×2): 5000 [IU] via TOPICAL

## 2015-03-15 MED ORDER — HEMOSTATIC AGENTS (NO CHARGE) OPTIME
TOPICAL | Status: DC | PRN
  Administered 2015-03-15: 1 via TOPICAL

## 2015-03-15 MED ORDER — ACETAMINOPHEN 650 MG RE SUPP: 650.0000 mg | RECTAL | Status: DC | PRN

## 2015-03-15 MED ORDER — PANTOPRAZOLE SODIUM 40 MG PO TBEC
40.0000 mg | DELAYED_RELEASE_TABLET | Freq: Every day | ORAL | Status: DC
  Administered 2015-03-15: 40 mg via ORAL
  Filled 2015-03-15: qty 1

## 2015-03-15 MED ORDER — MIDAZOLAM HCL 5 MG/5ML IJ SOLN
INTRAMUSCULAR | Status: DC | PRN
  Administered 2015-03-15: 2 mg via INTRAVENOUS

## 2015-03-15 MED ORDER — OXYCODONE HCL 5 MG/5ML PO SOLN: 5.0000 mg | Freq: Once | ORAL | Status: DC | PRN

## 2015-03-15 MED ORDER — PROPOFOL 10 MG/ML IV BOLUS
INTRAVENOUS | Status: AC
  Filled 2015-03-15: qty 20

## 2015-03-15 MED ORDER — DIAZEPAM 5 MG PO TABS: 5.0000 mg | ORAL_TABLET | Freq: Four times a day (QID) | ORAL | Status: DC | PRN

## 2015-03-15 MED ORDER — PANTOPRAZOLE SODIUM 40 MG IV SOLR: 40.0000 mg | Freq: Every day | INTRAVENOUS | Status: DC

## 2015-03-15 MED ORDER — LIDOCAINE HCL (CARDIAC) 20 MG/ML IV SOLN
INTRAVENOUS | Status: DC | PRN
  Administered 2015-03-15: 70 mg via INTRAVENOUS

## 2015-03-15 MED ORDER — DILTIAZEM HCL ER BEADS 240 MG PO CP24
360.0000 mg | ORAL_CAPSULE | Freq: Every day | ORAL | Status: DC
  Filled 2015-03-15: qty 1

## 2015-03-15 MED ORDER — FENTANYL CITRATE (PF) 250 MCG/5ML IJ SOLN
INTRAMUSCULAR | Status: AC
  Filled 2015-03-15: qty 5

## 2015-03-15 MED ORDER — PROMETHAZINE HCL 25 MG/ML IJ SOLN
INTRAMUSCULAR | Status: AC
  Filled 2015-03-15: qty 1

## 2015-03-15 MED ORDER — OXYCODONE HCL 5 MG PO TABS: 5.0000 mg | ORAL_TABLET | Freq: Once | ORAL | Status: DC | PRN

## 2015-03-15 MED ORDER — SODIUM CHLORIDE 0.9 % IR SOLN
Status: DC | PRN
  Administered 2015-03-15: 11:00:00

## 2015-03-15 MED ORDER — SODIUM CHLORIDE 0.9 % IV SOLN: 250.0000 mL | INTRAVENOUS | Status: DC

## 2015-03-15 MED ORDER — PROMETHAZINE HCL 25 MG/ML IJ SOLN
6.2500 mg | INTRAMUSCULAR | Status: DC | PRN
  Administered 2015-03-15: 6.25 mg via INTRAVENOUS

## 2015-03-15 MED ORDER — NEOSTIGMINE METHYLSULFATE 10 MG/10ML IV SOLN
INTRAVENOUS | Status: DC | PRN
  Administered 2015-03-15: 3 mg via INTRAVENOUS

## 2015-03-15 MED ORDER — HYDROMORPHONE HCL 1 MG/ML IJ SOLN
INTRAMUSCULAR | Status: AC
  Filled 2015-03-15: qty 1

## 2015-03-15 MED ORDER — SODIUM CHLORIDE 0.9 % IV SOLN
INTRAVENOUS | Status: DC
Start: 2015-03-15 — End: 2015-03-16

## 2015-03-15 MED ORDER — 0.9 % SODIUM CHLORIDE (POUR BTL) OPTIME
TOPICAL | Status: DC | PRN
  Administered 2015-03-15: 1000 mL

## 2015-03-15 MED ORDER — FENTANYL CITRATE (PF) 100 MCG/2ML IJ SOLN
INTRAMUSCULAR | Status: DC | PRN
  Administered 2015-03-15 (×2): 50 ug via INTRAVENOUS
  Administered 2015-03-15: 150 ug via INTRAVENOUS

## 2015-03-15 MED ORDER — SENNA 8.6 MG PO TABS
1.0000 | ORAL_TABLET | Freq: Two times a day (BID) | ORAL | Status: DC
  Administered 2015-03-15 (×2): 8.6 mg via ORAL
  Filled 2015-03-15 (×2): qty 1

## 2015-03-15 MED ORDER — METHYLPREDNISOLONE ACETATE 80 MG/ML IJ SUSP
INTRAMUSCULAR | Status: DC | PRN
  Administered 2015-03-15: 80 mg

## 2015-03-15 MED ORDER — MENTHOL 3 MG MT LOZG: 1.0000 | LOZENGE | OROMUCOSAL | Status: DC | PRN

## 2015-03-15 MED ORDER — HYDROMORPHONE HCL 1 MG/ML IJ SOLN
0.2500 mg | INTRAMUSCULAR | Status: DC | PRN
  Administered 2015-03-15: 0.5 mg via INTRAVENOUS

## 2015-03-15 SURGICAL SUPPLY — 58 items
APL SKNCLS STERI-STRIP NONHPOA (GAUZE/BANDAGES/DRESSINGS)
BAG DECANTER FOR FLEXI CONT (MISCELLANEOUS) ×2 IMPLANT
BENZOIN TINCTURE PRP APPL 2/3 (GAUZE/BANDAGES/DRESSINGS) IMPLANT
BLADE CLIPPER SURG (BLADE) IMPLANT
BLADE SURG 11 STRL SS (BLADE) ×2 IMPLANT
BUR MATCHSTICK NEURO 3.0 LAGG (BURR) ×2 IMPLANT
CANISTER SUCT 3000ML PPV (MISCELLANEOUS) ×2 IMPLANT
DECANTER SPIKE VIAL GLASS SM (MISCELLANEOUS) ×2 IMPLANT
DEVICE DISSECT PLASMABLAD 3.0S (MISCELLANEOUS) IMPLANT
DRAPE LAPAROTOMY 100X72X124 (DRAPES) ×2 IMPLANT
DRAPE MICROSCOPE LEICA (MISCELLANEOUS) ×2 IMPLANT
DRAPE POUCH INSTRU U-SHP 10X18 (DRAPES) ×2 IMPLANT
DRAPE SURG 17X23 STRL (DRAPES) ×2 IMPLANT
DRSG OPSITE POSTOP 3X4 (GAUZE/BANDAGES/DRESSINGS) ×2 IMPLANT
DURAPREP 26ML APPLICATOR (WOUND CARE) ×2 IMPLANT
ELECT REM PT RETURN 9FT ADLT (ELECTROSURGICAL) ×2
ELECTRODE REM PT RTRN 9FT ADLT (ELECTROSURGICAL) ×1 IMPLANT
GAUZE SPONGE 4X4 12PLY STRL (GAUZE/BANDAGES/DRESSINGS) IMPLANT
GAUZE SPONGE 4X4 16PLY XRAY LF (GAUZE/BANDAGES/DRESSINGS) IMPLANT
GLOVE BIOGEL PI IND STRL 7.5 (GLOVE) ×1 IMPLANT
GLOVE BIOGEL PI INDICATOR 7.5 (GLOVE) ×1
GLOVE ECLIPSE 7.0 STRL STRAW (GLOVE) ×2 IMPLANT
GLOVE EXAM NITRILE LRG STRL (GLOVE) IMPLANT
GLOVE EXAM NITRILE MD LF STRL (GLOVE) IMPLANT
GLOVE EXAM NITRILE XL STR (GLOVE) IMPLANT
GLOVE EXAM NITRILE XS STR PU (GLOVE) IMPLANT
GOWN STRL REUS W/ TWL LRG LVL3 (GOWN DISPOSABLE) ×2 IMPLANT
GOWN STRL REUS W/ TWL XL LVL3 (GOWN DISPOSABLE) IMPLANT
GOWN STRL REUS W/TWL 2XL LVL3 (GOWN DISPOSABLE) IMPLANT
GOWN STRL REUS W/TWL LRG LVL3 (GOWN DISPOSABLE) ×4
GOWN STRL REUS W/TWL XL LVL3 (GOWN DISPOSABLE)
HEMOSTAT POWDER KIT SURGIFOAM (HEMOSTASIS) ×2 IMPLANT
KIT BASIN OR (CUSTOM PROCEDURE TRAY) ×2 IMPLANT
KIT ROOM TURNOVER OR (KITS) ×2 IMPLANT
LIQUID BAND (GAUZE/BANDAGES/DRESSINGS) ×2 IMPLANT
NDL HYPO 18GX1.5 BLUNT FILL (NEEDLE) IMPLANT
NDL HYPO 25X1 1.5 SAFETY (NEEDLE) ×1 IMPLANT
NDL SPNL 18GX3.5 QUINCKE PK (NEEDLE) IMPLANT
NEEDLE HYPO 18GX1.5 BLUNT FILL (NEEDLE) IMPLANT
NEEDLE HYPO 25X1 1.5 SAFETY (NEEDLE) ×2 IMPLANT
NEEDLE SPNL 18GX3.5 QUINCKE PK (NEEDLE) IMPLANT
NS IRRIG 1000ML POUR BTL (IV SOLUTION) ×2 IMPLANT
PACK LAMINECTOMY NEURO (CUSTOM PROCEDURE TRAY) ×2 IMPLANT
PAD ARMBOARD 7.5X6 YLW CONV (MISCELLANEOUS) ×6 IMPLANT
PLASMABLADE ×1 IMPLANT
PLASMABLADE 3.0S (MISCELLANEOUS) ×2
RUBBERBAND STERILE (MISCELLANEOUS) ×4 IMPLANT
SPONGE LAP 4X18 X RAY DECT (DISPOSABLE) IMPLANT
SPONGE SURGIFOAM ABS GEL SZ50 (HEMOSTASIS) ×2 IMPLANT
STRIP CLOSURE SKIN 1/2X4 (GAUZE/BANDAGES/DRESSINGS) IMPLANT
SUT VIC AB 0 CT1 18XCR BRD8 (SUTURE) ×1 IMPLANT
SUT VIC AB 0 CT1 8-18 (SUTURE) ×2
SUT VIC AB 2-0 CT1 18 (SUTURE) IMPLANT
SUT VICRYL 3-0 RB1 18 ABS (SUTURE) ×4 IMPLANT
SYR 3ML LL SCALE MARK (SYRINGE) IMPLANT
TOWEL OR 17X24 6PK STRL BLUE (TOWEL DISPOSABLE) ×2 IMPLANT
TOWEL OR 17X26 10 PK STRL BLUE (TOWEL DISPOSABLE) ×2 IMPLANT
WATER STERILE IRR 1000ML POUR (IV SOLUTION) ×2 IMPLANT

## 2015-03-15 NOTE — H&P (Signed)
CC:  Back and left leg pain  HPI: Ethan Lambert is a 54 year old man seen for initial consultation in the office. He comes in today with a primary complaint of about 4-5 week history of left-sided low back pain. The pain started initially when he was trimming some hedges. Initially, he had severe left-sided back pain, with numbness and tingling involving the back of his left leg, and into his left foot. The pain was bad enough that he had trouble walking. He initially went to urgent care where he was placed on steroids, pain medications, and muscle relaxers. The course of steroids did provide some relief, however since he finished the course, the his pain returned. After a week or 2, the numbness in the left leg got a little bit better, but he has had continued persistent severe left-sided back pain. 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 He says that even routine household chores make his back pain significantly worse.  PMH: Past Medical History  Diagnosis Date  . Paroxysmal atrial fibrillation     CARDIOLOGIST--  DR Rayann Heman  . Hypertension   . HIV disease dx 73    MONITORED BY INFECTIOUS DISEASE -- DR Herbie Baltimore COMER  . Family history of malignant neoplasm of gastrointestinal tract   . History of CVA (cerebrovascular accident) no residuals    1999--  LEFT FRONTAL & OCCIPITAL , NON-HEMORRHAGIC  . History of nonmelanoma skin cancer   . History of kidney stones   . Right knee meniscal tear   . Anemia   . Blood transfusion without reported diagnosis   . Dysrhythmia   . Cancer     skin    PSH: Past Surgical History  Procedure Laterality Date  . Appendectomy  06-09-2003  . Laparoscopic cholecystectomy  07-08-2003  . Inguinal hernia repair Bilateral 1990's  . Laser ablation anal condyloma  05-18-2001  . Transthoracic echocardiogram  12-01-2008    MODERATE LVH/  EF 55-60%/  MILD MR/  NEGATIVE BUBBLE STUDY  . Negative sleep study   01-03-2012  in epic  . Knee arthroscopy with medial menisectomy Right 12/31/2013    Procedure: RIGHT KNEE ARTHROSCOPY PARTIAL MEDIAL MENISCECTOMY DEBRIDEMENT AND CHONDROPLASTY ;  Surgeon: Sydnee Cabal, MD;  Location: [place];  Service: Orthopedics;  Laterality: Right;  . Hernia repair      SH: Social History  Substance Use Topics  . Smoking status: Never Smoker   . Smokeless tobacco: Never Used  . Alcohol Use: 0.0 oz/week    0 Standard drinks or equivalent per week     Comment: rare    MEDS: Prior to Admission medications   Medication Sig Start Date End Date Taking? Authorizing Provider  Abacavir-Dolutegravir-Lamivud [id number] MG TABS Take 1 tablet by mouth daily. 01/27/15  Yes Thayer Headings, MD  diltiazem [facility]) 360 MG 24 hr capsule Take 1 capsule (360 mg total) by mouth daily. 12/28/14  Yes Thompson Grayer, MD  ELIQUIS 5 MG TABS tablet Take 1 tablet by mouth  twice a day 04/22/14  Yes Thompson Grayer, MD  flecainide (TAMBOCOR) 150 MG tablet Take 300 mg by mouth 2 (two) times daily as needed (A FIB).   Yes Historical Provider, MD  HYDROcodone-acetaminophen (NORCO/VICODIN) 5-325 MG per tablet Take 1 tablet by mouth every 6 (six) hours as needed for moderate pain.   Yes Historical Provider, MD    ALLERGY: Allergies  Allergen Reactions  .  Morphine Other (See Comments)    hallucinations    ROS: ROS  NEUROLOGIC EXAM: Awake, alert, oriented Memory and concentration grossly intact Speech fluent, appropriate CN grossly intact Motor exam: Upper Extremities Deltoid Bicep Tricep Grip  Right 5/5 5/5 5/5 5/5  Left 5/5 5/5 5/5 5/5   Lower Extremity IP Quad PF DF EHL  Right 5/5 5/5 5/5 5/5 5/5  Left 5/5 5/5 5/5 5/5 5/5   Sensation grossly intact to LT  IMGAING: MRI of the lumbar spine was reviewed. This demonstrates a left eccentric L5-S1 disc herniation with compression of the traversing left S1 nerve root.  IMPRESSION: 54 year old man with left-sided back  pain related to a large left L5-S1 disc herniation. He has failed a trial of conservative treatments.  PLAN: - Proceed with surgical decompression via left-sided L5-S1 laminotomy and microdiscectomy   The radiographic findings and general treatment options were discussed in detail with the patient. Given symtpoms despite conservative therapies, I recommended the above surgical decompression. The risks of the surgery were discussed in detail, including but not limited to bleeding, infection, nerve injury resulting in leg/foot weakness or bowel/bladder dysfunction, and CSF leak. Possible outcomes of surgery were also discussed including the possibility of uncomplicated surgery but persistence of pain symptoms.  The patient understood our discussion and is willing to proceed with surgery. All questions were answered.

## 2015-03-15 NOTE — Anesthesia Preprocedure Evaluation (Addendum)
Anesthesia Evaluation  Patient identified by MRN, date of birth, ID band Patient awake    Reviewed: Allergy & Precautions, NPO status , Patient's Chart, lab work & pertinent test results  Airway Mallampati: II  TM Distance: >3 FB Neck ROM: Full    Dental  (+) Dental Advisory Given, Teeth Intact   Pulmonary neg pulmonary ROS,    breath sounds clear to auscultation       Cardiovascular hypertension, + Peripheral Vascular Disease  + dysrhythmias Atrial Fibrillation  Rhythm:Regular Rate:Normal     Neuro/Psych CVA    GI/Hepatic negative GI ROS, Neg liver ROS,   Endo/Other  Morbid obesity  Renal/GU negative Renal ROS     Musculoskeletal  (+) Arthritis ,   Abdominal   Peds  Hematology HIV   Anesthesia Other Findings   Reproductive/Obstetrics                          Anesthesia Physical Anesthesia Plan  ASA: III  Anesthesia Plan: General   Post-op Pain Management:    Induction: Intravenous  Airway Management Planned: Oral ETT  Additional Equipment:   Intra-op Plan:   Post-operative Plan: Extubation in OR  Informed Consent: I have reviewed the patients History and Physical, chart, labs and discussed the procedure including the risks, benefits and alternatives for the proposed anesthesia with the patient or authorized representative who has indicated his/her understanding and acceptance.   Dental advisory given  Plan Discussed with: CRNA  Anesthesia Plan Comments:        Anesthesia Quick Evaluation

## 2015-03-15 NOTE — Anesthesia Postprocedure Evaluation (Signed)
  Anesthesia Post-op Note  Patient: Ethan Lambert  Procedure(s) Performed: Procedure(s) with comments: Left Lumbar five- sacral one microdiskectomy (Left) - Left L5S1 microdiskectomy  Patient Location: PACU  Anesthesia Type:General  Level of Consciousness: awake and alert   Airway and Oxygen Therapy: Patient Spontanous Breathing  Post-op Pain: mild  Post-op Assessment: Post-op Vital signs reviewed   LLE Sensation: Full sensation   RLE Sensation: Full sensation      Post-op Vital Signs: Reviewed  Last Vitals:  Filed Vitals:   03/15/15 1326  BP: 128/87  Pulse: 68  Temp: 36.8 C  Resp: 16    Complications: No apparent anesthesia complications

## 2015-03-15 NOTE — Progress Notes (Signed)
Pt doing well postop. Surgical site soreness, otherwise no complaints. Has ambulated, voiding normally.  EXAM:  BP 129/68 mmHg  Pulse 65  Temp(Src) 98.6 F (37 C) (Oral)  Resp 16  Wt 116.166 kg (256 lb 1.6 oz)  SpO2 95%  Awake, alert, oriented  Speech fluent, appropriate  CN grossly intact  5/5 BUE/BLE  Wound c/d/i  IMPRESSION:  54 y.o. male postop left L5-S1 microdiscectomy, doing well  PLAN: - D/C tomorrow - Pt already has plan to restart Eliquis in a few days.

## 2015-03-15 NOTE — Transfer of Care (Signed)
Immediate Anesthesia Transfer of Care Note  Patient: Ethan Lambert  Procedure(s) Performed: Procedure(s) with comments: Left Lumbar five- sacral one microdiskectomy (Left) - Left L5S1 microdiskectomy  Patient Location: PACU  Anesthesia Type:General  Level of Consciousness: awake, alert  and oriented  Airway & Oxygen Therapy: Patient Spontanous Breathing and Patient connected to nasal cannula oxygen  Post-op Assessment: Report given to RN, Post -op Vital signs reviewed and stable and Patient moving all extremities X 4  Post vital signs: Reviewed and stable  Last Vitals:  Filed Vitals:   03/15/15 1200  BP:   Pulse:   Temp: 36.4 C  Resp:     Complications: No apparent anesthesia complications

## 2015-03-15 NOTE — Op Note (Signed)
PREOP DIAGNOSIS: Lumbar disc herniation, left L5-S1  POSTOP DIAGNOSIS: Same  PROCEDURE: 1. Left L5 laminotomy and microdiscectomy for decompression of nerve root 2. Use of operating microscope  SURGEON: Dr. Consuella Lose, MD  ASSISTANT: Dr. Newman Pies, MD  ANESTHESIA: General Endotracheal  EBL: 50cc  SPECIMENS: None  DRAINS: None  COMPLICATIONS: None immediate  CONDITION: Hemodynamically stable to PACU  HISTORY: Ethan Lambert is a 54 y.o. male initially presenting to the outpatient clinic with back and left leg pain. MRI demontrated a left L5-S1 disc herniation. He failed a reasonable attempt at conservative treatment and presents today for elective microdiscectomy.  PROCEDURE IN DETAIL: After informed consent was obtained and witnessed, the patient was brought to the operating room. After induction of general anesthesia, the patient was positioned on the operative table in the prone position with all pressure points meticulously padded. The skin of the low back was then prepped and draped in the usual sterile fashion.  Under fluoroscopy, the correct level was identified and marked out on the skin, and after timeout was conducted, the skin was infiltrated with local anesthetic. Skin incision was then made sharply and electrocautery was used to dissect the subcutaneous tissue until the lumbodorsal fascia was identified. The fascia was then incised using electrocautery and the lamina at the L5 and S1 levels was identified and dissection was carried out in the subperiosteal plane. Self-retaining retractor was then placed, and intraoperative x-ray was taken to confirm we were at the correct level.  Using a high-speed drill, laminotomy was completed with a partial medial facetectomy. The ligamentum flavum was then identified and removed and the lateral edge of the thecal sac was identified. This was then traced down to identify the traversing nerve root. Focal disc herniation  was identified at the level of the disc space compressing the S1 root. The posterior annulus was then incised and using a combination of dissectors, the herniated disc fragment was removed. The decompression of the nerve root was confirmed using a dissector.  Hemostasis was then secured using a combination of morcellized Gelfoam and thrombin and bipolar electrocautery. The wound is irrigated with copious amounts of antibiotic saline irrigation. The nerve root was then covered with a long-acting steroid solution. Self-retaining retractor was then removed, and the wound is closed in layers using a combination of interrupted 0 Vicryl and 3-0 Vicryl stitches. The skin was closed using standard skin glue.  At the end of the case all sponge, needle, and instrument counts were correct. The patient was then transferred to the stretcher and taken to the postanesthesia care unit in stable hemodynamic condition.

## 2015-03-15 NOTE — Progress Notes (Signed)
Pt transported per [facility], NT

## 2015-03-15 NOTE — Anesthesia Procedure Notes (Signed)
Procedure Name: Intubation Date/Time: 03/15/2015 10:34 AM Performed by: Kyung Rudd Pre-anesthesia Checklist: Patient identified, Emergency Drugs available, Suction available, Patient being monitored and Timeout performed Patient Re-evaluated:Patient Re-evaluated prior to inductionOxygen Delivery Method: Circle system utilized Preoxygenation: Pre-oxygenation with 100% oxygen Intubation Type: IV induction Ventilation: Mask ventilation without difficulty and Oral airway inserted - appropriate to patient size Laryngoscope Size: Mac and 4 Grade View: Grade II Tube type: Oral Tube size: 7.5 mm Number of attempts: 1 Airway Equipment and Method: Stylet Placement Confirmation: ETT inserted through vocal cords under direct vision,  positive ETCO2 and breath sounds checked- equal and bilateral Secured at: 22 cm Tube secured with: Tape Dental Injury: Teeth and Oropharynx as per pre-operative assessment

## 2015-03-16 DIAGNOSIS — M5126 Other intervertebral disc displacement, lumbar region: Secondary | ICD-10-CM | POA: Diagnosis not present

## 2015-03-16 NOTE — Discharge Summary (Signed)
Physician Discharge Summary  Patient ID: Ethan Lambert MRN: [id number] DOB/AGE: March 22, 1961 54 y.o.  Admit date: 03/15/2015 Discharge date: 03/16/2015  Admission Diagnoses: Left L5-S1 herniated disc, stenosis, lumbago, lumbar radiculopathy  Discharge Diagnoses:  Active Problems:   HNP (herniated nucleus pulposus), lumbar   Discharged Condition: good  Hospital CourDr. Kathyrn Sheriff performed a left L5-S1 discectomy on the patient on 03/15/2015.  The patient's postoperative course was unremarkable. On postoperative day #1 he requested discharge to home. The patient was given oral and written discharge instructions. All his questions were answered. Consults:None  Significant Diagnostic Stnone Treatments: Left L5-S1 discectomy using microdissection  Discharge Exam: Blood pressure 129/75, pulse 93, temperature 98.4 F (36.9 C), temperature source Oral, resp. rate 18, weight 116.166 kg (256 lb 1.6 oz), SpO2 94 %.  the patient is alert and pleasant. He looks well. He is in no apparent distress. His dressing is clean and dry. Strength is grossly normal in his lower extremities.  Disposition: Home  Discharge Instructions    Call MD for:  difficulty breathing, headache or visual disturbances    Complete by:  As directed      Call MD for:  extreme fatigue    Complete by:  As directed      Call MD for:  hives    Complete by:  As directed      Call MD for:  persistant dizziness or light-headedness    Complete by:  As directed      Call MD for:  persistant nausea and vomiting    Complete by:  As directed      Call MD for:  redness, tenderness, or signs of infection (pain, swelling, redness, odor or green/yellow discharge around incision site)    Complete by:  As directed      Call MD for:  severe uncontrolled pain    Complete by:  As directed      Call MD for:  temperature >100.4    Complete by:  As directed      Diet - low sodium heart healthy    Complete by:  As directed      Discharge instructions    Complete by:  As directed   Call [phone] for a followup appointment. Take a stool softener while you are using pain medications.     Driving Restrictions    Complete by:  As directed   Do not drive for 2 weeks.     Increase activity slowly    Complete by:  As directed      Lifting restrictions    Complete by:  As directed   Do not lift more than 5 pounds. No excessive bending or twisting.     May shower / Bathe    Complete by:  As directed   He may shower after the pain she is removed 3 days after surgery. Leave the incision alone.     Remove dressing in 48 hours    Complete by:  As directed   Your stitches are under the scan and will dissolve by themselves. The Steri-Strips will fall off after you take a few showers. Do not rub back or pick at the wound, Leave the wound alone.            Medication List    STOP taking these medications        ELIQUIS 5 MG Tabs tablet  Generic drug:  apixaban      TAKE these medications  Abacavir-Dolutegravir-Lamivud 600-50-300 MG Tabs  Take 1 tablet by mouth daily.     diltiazem 360 MG 24 hr capsule  Commonly known as:  TIAZAC  Take 1 capsule (360 mg total) by mouth daily.     flecainide 150 MG tablet  Commonly known as:  TAMBOCOR  Take 300 mg by mouth 2 (two) times daily as needed (A FIB).     HYDROcodone-acetaminophen 5-325 MG per tablet  Commonly known as:  NORCO/VICODIN  Take 1-2 tablets by mouth every 6 (six) hours as needed for moderate pain.           Follow-up Information    Follow up with [facility], NEELESH, C, MD In 2 weeks.   Specialty:  Neurosurgery   Contact information:   [address] [place] [address] [phone]       Signed: Ophelia Charter 03/16/2015, 7:53 AM

## 2015-03-16 NOTE — Progress Notes (Signed)
Pt doing well. Pt given D/C instructions with Rx, verbal understanding was provided. Pt's incision is clean and dry with no sign of infection. Pt's IV was removed prior to D/C. Pt D/C'd home via walking @ 0920 per MD order. Pt is stable @ D/C and has no other needs at this time. Holli Humbles, RN

## 2015-03-16 NOTE — Discharge Instructions (Signed)
Microdiskectomy  Your spine is made up of bones called vertebrae. Oval-shaped disks filled with a thick liquid sit between the vertebrae. The disks function like cushions and keep the bones from rubbing together. However, an injury or normal aging can cause a disk to bulge out (herniated disk). The herniated disk can press on a nerve root, which is painful. The pain may be in the lower back, or it might shoot down a leg. There also may be tingling or numbness. One way to stop the pain is with a minimally invasive surgery called microdiskectomy. The part of the disk that sticks out from the spine is removed. Only a small surgical cut (incision) is made in the back. Microdiskectomy is easier on the muscles and other tissues than traditional surgery.  LET YOUR CAREGIVER KNOW ABOUT:  Any allergies.  The last time you had anything to eat or drink. This includes water, gum, and candy.  All medicines you are taking, including herbs, eyedrops, over-the-counter medicines and creams.  Blood thinners (anticoagulants), aspirin, or other drugs that could affect blood clotting.  Any history of blood clots.  Any history of bleeding or other blood problems.  Use of steroids (by mouth or as creams).  Previous problems with anesthesia.  Family history of anesthetic complications.  Possibility of pregnancy, if this applies.  Previous surgery.  Smoking history.  Any recent symptoms of colds or infections.  Other health problems. RISKS AND COMPLICATIONS   Leaking of spinal fluid. If this happens, you will need to stay in bed for a few days.  Bleeding.  Pain.  Infection near the incision.  Nerve damage.  Blood clot in a leg. The clot can move to the lungs. This can be very serious.  A herniated disk might come back (recur) and require additional surgery. BEFORE THE PROCEDURE   A medical evaluation will be done. This may include:  A physical exam.  Blood tests.  A test that checks  heart rhythm (electrocardiography).  Imaging tests such as a myelogram, chest X-ray, or MRI.  Ask your caregiver about changing or stopping your regular medicines.  You may need to stop using aspirin and nonsteroidal anti-inflammatory drugs (NSAIDs). This includes prescription drugs and over-the-counter drugs. If possible, do this 10 to 14 days before the procedure or as directed. You may also need to stop taking vitamin E.  If you take blood thinners, ask your caregiver when you should stop taking them.  You may need a stool-softening medicine a few days before the procedure.  Stop smoking 2 weeks before the procedure if you smoke. Smoking can slow down the healing process.  The day before the procedure, eat only a light dinner. Do not eat or drink anything for at least 8 hours before the procedure. Ask if it is okay to take any needed medicines with a sip of water.  You may talk with the person who will be in charge of the anesthetic medicine during the procedure.  Arrive at least 1 to 2 hours before the procedure or as directed. PROCEDURE  Small monitors will be put on your body. They are used to check your heart, blood pressure, and oxygen level.  An intravenous (IV) access tube will be inserted in your vein. Medicine will be able to flow directly into your body through the IV.  You might be given a medicine to help you relax (sedative).  You will be given a medicine that makes you sleep (general anesthetic).  Your back will  be cleaned with a special solution to kill germs on the skin.  Once you are asleep, the surgeon will make a small incision in the middle of your lower back. It is often just 1 to 2 inches long.  Muscles in the back are not cut. They are moved to the side.  Often, a small piece of bone needs to be removed. This creates a better "window" so the surgeon can see the herniated disk.  The surgeon uses a microscope to check the disk and nerves near it.  The  part of the disk that is causing problems is removed.  The area under the skin is closed with stitches. In time, these will go away on their own.  The skin is closed with small stitches (sutures) or staples.  A small bandage (dressing) is put over the incision.  The procedure usually takes about 1 hour. AFTER THE PROCEDURE   You will stay in a recovery area until the anesthesia has worn off. Your blood pressure and pulse will be checked every so often.  Some pain is normal after this surgery. You will probably be given pain medicine while in the recovery area.  You may be able to go home the same day as the surgery. Once you get up and start walking, you will be able to go home. Sometimes, an overnight stay is needed. Document Released: 05/29/2009 Document Revised: 09/02/2011 Document Reviewed: 05/29/2009 [facility] Patient Information 2015 [place], [place]. This information is not intended to replace advice given to you by your health care provider. Make sure you discuss any questions you have with your health care provider.

## 2015-03-17 ENCOUNTER — Encounter (HOSPITAL_COMMUNITY): Payer: Self-pay | Admitting: Neurosurgery

## 2015-03-17 DIAGNOSIS — Z0271 Encounter for disability determination: Secondary | ICD-10-CM

## 2015-03-27 ENCOUNTER — Encounter (HOSPITAL_COMMUNITY): Payer: Self-pay | Admitting: Nurse Practitioner

## 2015-03-27 ENCOUNTER — Ambulatory Visit (HOSPITAL_COMMUNITY)
Admission: RE | Admit: 2015-03-27 | Discharge: 2015-03-27 | Disposition: A | Discharge status: Home / Self Care | Payer: 59 | Source: Ambulatory Visit | Attending: Nurse Practitioner | Admitting: Nurse Practitioner

## 2015-03-27 VITALS — BP 132/76 | HR 60 | Ht 73.0 in | Wt 258.6 lb

## 2015-03-27 DIAGNOSIS — Z6834 Body mass index (BMI) 34.0-34.9, adult: Secondary | ICD-10-CM | POA: Diagnosis not present

## 2015-03-27 DIAGNOSIS — B20 Human immunodeficiency virus [HIV] disease: Secondary | ICD-10-CM | POA: Insufficient documentation

## 2015-03-27 DIAGNOSIS — Z885 Allergy status to narcotic agent status: Secondary | ICD-10-CM | POA: Insufficient documentation

## 2015-03-27 DIAGNOSIS — R635 Abnormal weight gain: Secondary | ICD-10-CM | POA: Insufficient documentation

## 2015-03-27 DIAGNOSIS — Z79899 Other long term (current) drug therapy: Secondary | ICD-10-CM | POA: Diagnosis not present

## 2015-03-27 DIAGNOSIS — I10 Essential (primary) hypertension: Secondary | ICD-10-CM | POA: Insufficient documentation

## 2015-03-27 DIAGNOSIS — Z7902 Long term (current) use of antithrombotics/antiplatelets: Secondary | ICD-10-CM | POA: Insufficient documentation

## 2015-03-27 DIAGNOSIS — Z8673 Personal history of transient ischemic attack (TIA), and cerebral infarction without residual deficits: Secondary | ICD-10-CM | POA: Insufficient documentation

## 2015-03-27 DIAGNOSIS — I480 Paroxysmal atrial fibrillation: Secondary | ICD-10-CM | POA: Insufficient documentation

## 2015-03-27 DIAGNOSIS — Z8249 Family history of ischemic heart disease and other diseases of the circulatory system: Secondary | ICD-10-CM | POA: Diagnosis not present

## 2015-03-27 NOTE — Progress Notes (Signed)
Patient ID: Ethan Lambert, male   DOB: [birth year], 54 y.o.   MRN: [id number]     Primary Care Physician: No PCP Per Patient Referring Physician: Dr. Philip Lambert is a 54 y.o. male with a h/o PAF for evaluation in the afib clinic. He reports just a few episodes of afib that are converted quickly, within one hour, with flecainide 150 mg x 2 tablets.  He feels that his afib burden is still manageable with prn flecainide and he does not thinks he needs daily flecainide. He continues on eliquis 5 mg bid without bleeding issues. We discussed risk factors for afib issues, he does weigh more than ever, he thinks because of inactivity, he hopes to get back to exercising soon with recent back surgery. No alcohol use, mod caffeine, usually not a trigger, negative sleep study in past.  Today, he denies symptoms of palpitations, chest pain, shortness of breath, orthopnea, PND, lower extremity edema, dizziness, presyncope, syncope, or neurologic sequela. The patient is tolerating medications without difficulties and is otherwise without complaint today.   Past Medical History  Diagnosis Date  . Paroxysmal atrial fibrillation (HCC)     CARDIOLOGIST--  DR Ethan Lambert  . Hypertension   . 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 Family history of malignant neoplasm of gastrointestinal tract   . History of CVA (cerebrovascular accident) no residuals    1999--  LEFT FRONTAL & OCCIPITAL , NON-HEMORRHAGIC  . History of nonmelanoma skin cancer   . History of kidney stones   . Right knee meniscal tear   . Anemia   . Blood transfusion without reported diagnosis   . Dysrhythmia   . Cancer [facility])     skin   Past Surgical History  Procedure Laterality Date  . Appendectomy  06-09-2003  . Laparoscopic cholecystectomy  07-08-2003  . Inguinal hernia repair Bilateral 1990's  . Laser ablation anal condyloma  05-18-2001  . Transthoracic echocardiogram  12-01-2008      MODERATE LVH/  EF 55-60%/  MILD MR/  NEGATIVE BUBBLE STUDY  . Negative sleep study  01-03-2012  in epic  . Knee arthroscopy with medial menisectomy Right 12/31/2013    Procedure: RIGHT KNEE ARTHROSCOPY PARTIAL MEDIAL MENISCECTOMY DEBRIDEMENT AND CHONDROPLASTY ;  Surgeon: Ethan Cabal, MD;  Location: [place];  Service: Orthopedics;  Laterality: Right;  . Hernia repair    . Lumbar laminectomy/decompression microdiscectomy Left 03/15/2015    Procedure: Left Lumbar five- sacral one microdiskectomy;  Surgeon: Ethan Lose, MD;  Location: [place] [facility]UR[facility]S;  Service: Neurosurgery;  Laterality: Left;  Left L5S1 microdiskectomy    Current Outpatient Prescriptions  Medication Sig Dispense Refill  . Abacavir-Dolutegravir-Lamivud 600-50-300 MG TABS Take 1 tablet by mouth daily. 30 tablet 5  . apixaban (ELIQUIS) 5 MG TABS tablet Take 5 mg by mouth 2 (two) times daily.    [name] diltiazem (TIAZAC) 360 MG 24 hr capsule Take 1 capsule (360 mg total) by mouth daily. 90 capsule 3  . flecainide (TAMBOCOR) 150 MG tablet Take 300 mg by mouth 2 (two) times daily as needed (A FIB).    [name] HYDROcodone-acetaminophen (NORCO/VICODIN) 5-325 MG per tablet Take 1-2 tablets by mouth every 6 (six) hours as needed for moderate pain. 60 tablet 0   No current facility-administered medications for this encounter.    Allergies  Allergen Reactions  . Morphine Other (See Comments)    hallucinations  Social History   Social History  . Marital Status: Married    Spouse Name: N/A  . Number of Children: 1  . Years of Education: N/A   Occupational History  . [employer] CLERK Database administrator   Social History Main Topics  . Smoking status: Never Smoker   . Smokeless tobacco: Never Used  . Alcohol Use: 0.0 oz/week    0 Standard drinks or equivalent per week     Comment: rare  . Drug Use: No  . Sexual Activity: Not on file   Other Topics Concern  . Not on file   Social History Narrative    Pt lives in [place].  Unemployed.    Family History  Problem Relation Age of Onset  . Arrhythmia Father   . Heart attack Brother   . Prostate cancer Maternal Grandfather   . Colon cancer Maternal Grandfather   . Lung cancer Mother     smoker  . Hypertension Mother   . Hyperlipidemia Mother   . Other Maternal Grandmother     harding of the arteries    ROS- All systems are reviewed and negative except as per the HPI above  Physical Exam: Filed Vitals:   03/27/15 0841  BP: 132/76  Pulse: 60  Height: 6\' 1"  (1.854 m)  Weight: 258 lb 9.6 oz (117.3 kg)    GEN- The patient is well appearing, alert and oriented x 3 today.   Head- normocephalic, atraumatic Eyes-  Sclera clear, conjunctiva pink Ears- hearing intact Oropharynx- clear Neck- supple, no JVP Lymph- no cervical lymphadenopathy Lungs- Clear to ausculation bilaterally, normal work of breathing Heart- Regular rate and rhythm, no murmurs, rubs or gallops, PMI not laterally displaced GI- soft, NT, ND, + BS Extremities- no clubbing, cyanosis, or edema MS- no significant deformity or atrophy Skin- no rash or lesion Psych- euthymic mood, full affect Neuro- strength and sensation are intact  EKG- NSR, RBBB Epic records reviewed  Assessment and Plan: 1. PAF Doing well with prn flecainide. Aware that he cannot take the 300 mg dose more than once a week.  Continue eliquis, no bleeding issues. Continue diltiazem  2. Inactivity/weight gain Increase activity with weight loss encouraged, to diminish afib burden  F/u in afib clinic in 3-4 months  Ethan Lambert, [facility] [address] [place], [place] [address] [phone]

## 2015-05-09 ENCOUNTER — Other Ambulatory Visit: Payer: Self-pay | Admitting: Internal Medicine

## 2015-05-19 ENCOUNTER — Emergency Department (HOSPITAL_COMMUNITY)
Admission: EM | Admit: 2015-05-19 | Discharge: 2015-05-19 | Disposition: A | Discharge status: Home / Self Care | Payer: 59 | Attending: Emergency Medicine | Admitting: Emergency Medicine

## 2015-05-19 ENCOUNTER — Encounter (HOSPITAL_COMMUNITY): Payer: Self-pay | Admitting: *Deleted

## 2015-05-19 ENCOUNTER — Emergency Department (HOSPITAL_COMMUNITY): Payer: 59

## 2015-05-19 DIAGNOSIS — Z87442 Personal history of urinary calculi: Secondary | ICD-10-CM | POA: Diagnosis not present

## 2015-05-19 DIAGNOSIS — Z87828 Personal history of other (healed) physical injury and trauma: Secondary | ICD-10-CM | POA: Insufficient documentation

## 2015-05-19 DIAGNOSIS — Z7902 Long term (current) use of antithrombotics/antiplatelets: Secondary | ICD-10-CM | POA: Insufficient documentation

## 2015-05-19 DIAGNOSIS — I480 Paroxysmal atrial fibrillation: Secondary | ICD-10-CM | POA: Diagnosis not present

## 2015-05-19 DIAGNOSIS — Z862 Personal history of diseases of the blood and blood-forming organs and certain disorders involving the immune mechanism: Secondary | ICD-10-CM | POA: Diagnosis not present

## 2015-05-19 DIAGNOSIS — Z85828 Personal history of other malignant neoplasm of skin: Secondary | ICD-10-CM | POA: Insufficient documentation

## 2015-05-19 DIAGNOSIS — Z21 Asymptomatic human immunodeficiency virus [HIV] infection status: Secondary | ICD-10-CM | POA: Diagnosis not present

## 2015-05-19 DIAGNOSIS — R05 Cough: Secondary | ICD-10-CM | POA: Insufficient documentation

## 2015-05-19 DIAGNOSIS — I10 Essential (primary) hypertension: Secondary | ICD-10-CM | POA: Insufficient documentation

## 2015-05-19 DIAGNOSIS — Z79899 Other long term (current) drug therapy: Secondary | ICD-10-CM | POA: Diagnosis not present

## 2015-05-19 DIAGNOSIS — Z8673 Personal history of transient ischemic attack (TIA), and cerebral infarction without residual deficits: Secondary | ICD-10-CM | POA: Diagnosis not present

## 2015-05-19 DIAGNOSIS — R058 Other specified cough: Secondary | ICD-10-CM

## 2015-05-19 MED ORDER — ALBUTEROL SULFATE HFA 108 (90 BASE) MCG/ACT IN AERS: 2.0000 | INHALATION_SPRAY | RESPIRATORY_TRACT | Status: DC | PRN

## 2015-05-19 MED ORDER — IPRATROPIUM-ALBUTEROL 0.5-2.5 (3) MG/3ML IN SOLN
3.0000 mL | Freq: Once | RESPIRATORY_TRACT | Status: AC
  Administered 2015-05-19: 3 mL via RESPIRATORY_TRACT
  Filled 2015-05-19: qty 3

## 2015-05-19 NOTE — Discharge Instructions (Signed)
Take your hydrocodone-acetaminophen tablet at bedtime as needed to help suppress the cough.   Cough, Adult Coughing is a reflex that clears your throat and your airways. Coughing helps to heal and protect your lungs. It is normal to cough occasionally, but a cough that happens with other symptoms or lasts a long time may be a sign of a condition that needs treatment. A cough may last only 2-3 weeks (acute), or it may last longer than 8 weeks (chronic). CAUSES Coughing is commonly caused by:  Breathing in substances that irritate your lungs.  A viral or bacterial respiratory infection.  Allergies.  Asthma.  Postnasal drip.  Smoking.  Acid backing up from the stomach into the esophagus (gastroesophageal reflux).  Certain medicines.  Chronic lung problems, including COPD (or rarely, lung cancer).  Other medical conditions such as heart failure. HOME CARE INSTRUCTIONS  Pay attention to any changes in your symptoms. Take these actions to help with your discomfort:  Take medicines only as told by your health care provider.  If you were prescribed an antibiotic medicine, take it as told by your health care provider. Do not stop taking the antibiotic even if you start to feel better.  Talk with your health care provider before you take a cough suppressant medicine.  Drink enough fluid to keep your urine clear or pale yellow.  If the air is dry, use a cold steam vaporizer or humidifier in your bedroom or your home to help loosen secretions.  Avoid anything that causes you to cough at work or at home.  If your cough is worse at night, try sleeping in a semi-upright position.  Avoid cigarette smoke. If you smoke, quit smoking. If you need help quitting, ask your health care provider.  Avoid caffeine.  Avoid alcohol.  Rest as needed. SEEK MEDICAL CARE IF:   You have new symptoms.  You cough up pus.  Your cough does not get better after 2-3 weeks, or your cough gets  worse.  You cannot control your cough with suppressant medicines and you are losing sleep.  You develop pain that is getting worse or pain that is not controlled with pain medicines.  You have a fever.  You have unexplained weight loss.  You have night sweats. SEEK IMMEDIATE MEDICAL CARE IF:  You cough up blood.  You have difficulty breathing.  Your heartbeat is very fast.   This information is not intended to replace advice given to you by your health care provider. Make sure you discuss any questions you have with your health care provider.   Document Released: 12/07/2010 Document Revised: 03/01/2015 Document Reviewed: 08/17/2014 Elsevier Interactive Patient Education 2016 Elsevier Inc.  Albuterol inhalation aerosol What is this medicine? ALBUTEROL (al Norm[place]ean [place]) is a bronchodilator. It helps open up the airways in your lungs to make it easier to breathe. This medicine is used to treat and to prevent bronchospasm. This medicine may be used for other purposes; ask your health care provider or pharmacist if you have questions. What should I tell my health care provider before I take this medicine? They need to know if you have any of the following conditions: -diabetes -heart disease or irregular heartbeat -high blood pressure -pheochromocytoma -seizures -thyroid disease -an unusual or allergic reaction to albuterol, levalbuterol, sulfites, other medicines, foods, dyes, or preservatives -pregnant or trying to get pregnant -breast-feeding How should I use this medicine? This medicine is for inhalation through the mouth. Follow the directions on your prescription label.  Take your medicine at regular intervals. Do not use more often than directed. Make sure that you are using your inhaler correctly. Ask you doctor or health care provider if you have any questions. Talk to your pediatrician regarding the use of this medicine in children. Special care may be  needed. Overdosage: If you think you have taken too much of this medicine contact a poison control center or emergency room at once. NOTE: This medicine is only for you. Do not share this medicine with others. What if I miss a dose? If you miss a dose, use it as soon as you can. If it is almost time for your next dose, use only that dose. Do not use double or extra doses. What may interact with this medicine? -anti-infectives like chloroquine and pentamidine -caffeine -cisapride -diuretics -medicines for colds -medicines for depression or for emotional or psychotic conditions -medicines for weight loss including some herbal products -methadone -some antibiotics like clarithromycin, erythromycin, levofloxacin, and linezolid -some heart medicines -steroid hormones like dexamethasone, cortisone, hydrocortisone -theophylline -thyroid hormones This list may not describe all possible interactions. Give your health care provider a list of all the medicines, herbs, non-prescription drugs, or dietary supplements you use. Also tell them if you smoke, drink alcohol, or use illegal drugs. Some items may interact with your medicine. What should I watch for while using this medicine? Tell your doctor or health care professional if your symptoms do not improve. Do not use extra albuterol. If your asthma or bronchitis gets worse while you are using this medicine, call your doctor right away. If your mouth gets dry try chewing sugarless gum or sucking hard candy. Drink water as directed. What side effects may I notice from receiving this medicine? Side effects that you should report to your doctor or health care professional as soon as possible: -allergic reactions like skin rash, itching or hives, swelling of the face, lips, or tongue -breathing problems -chest pain -feeling faint or lightheaded, falls -high blood pressure -irregular heartbeat -fever -muscle cramps or weakness -pain, tingling,  numbness in the hands or feet -vomiting Side effects that usually do not require medical attention (report to your doctor or health care professional if they continue or are bothersome): -cough -difficulty sleeping -headache -nervousness or trembling -stomach upset -stuffy or runny nose -throat irritation -unusual taste This list may not describe all possible side effects. Call your doctor for medical advice about side effects. You may report side effects to FDA at 1-800-FDA-1088. Where should I keep my medicine? Keep out of the reach of children. Store at room temperature between 15 and 30 degrees C (59 and 86 degrees F). The contents are under pressure and may burst when exposed to heat or flame. Do not freeze. This medicine does not work as well if it is too cold. Throw away any unused medicine after the expiration date. Inhalers need to be thrown away after the labeled number of puffs have been used or by the expiration date; whichever comes first. Ventolin HFA should be thrown away 12 months after removing from foil pouch. Check the instructions that come with your medicine. NOTE: This sheet is a summary. It may not cover all possible information. If you have questions about this medicine, talk to your doctor, pharmacist, or health care provider.    2016, Elsevier/Gold Standard. (2012-11-26 10:57:17)

## 2015-05-19 NOTE — ED Notes (Signed)
Pt c/o productive cough x 7 days. States mucous is light green. Denies sore throat. Also c/o nasal congestion.

## 2015-05-19 NOTE — ED Provider Notes (Signed)
CSN: [id number]     Arrival date & time 05/19/15  0213 History  By signing my name below, I, Soijett Blue, attest that this documentation has been prepared under the direction and in the presence of Delora Fuel, MD. Electronically Signed: Soijett Blue, ED Scribe. 05/19/2015. 3:00 AM.   Chief Complaint  Patient presents with  . Cough      The history is provided by the patient. No language interpreter was used.    Ethan Lambert is a 54 y.o. male with a PMHx of HTN, HIV, who presents to the Emergency Department complaining of dry cough x 7 days. He notes that his cough was initially productive of light green sputum. He notes that when he tries to go to sleep he will wake up due to trouble breathing. He reports that he has had his flu shot for the year. He states that he is having associated symptoms of nasal congestion and chills. He states that he has tried Robitussin and nasonex with no relief for his symptoms. He denies sore throat, fever, n/v/d, and any other symptoms. He notes that he has sick contacts at this time of someone with similar symptoms. Pt denies smoking cigarettes at this time. He states that he has been taking all of his medications as prescribed.    Past Medical History  Diagnosis Date  . Paroxysmal atrial fibrillation (HCC)     CARDIOLOGIST--  DR Rayann Heman  . Hypertension   . 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 Family history of malignant neoplasm of gastrointestinal tract   . History of CVA (cerebrovascular accident) no residuals    1999--  LEFT FRONTAL & OCCIPITAL , NON-HEMORRHAGIC  . History of nonmelanoma skin cancer   . History of kidney stones   . Right knee meniscal tear   . Anemia   . Blood transfusion without reported diagnosis   . Dysrhythmia   . Cancer [facility])     skin   Past Surgical History  Procedure Laterality Date  . Appendectomy  06-09-2003  . Laparoscopic cholecystectomy  07-08-2003  . Inguinal  hernia repair Bilateral 1990's  . Laser ablation anal condyloma  05-18-2001  . Transthoracic echocardiogram  12-01-2008    MODERATE LVH/  EF 55-60%/  MILD MR/  NEGATIVE BUBBLE STUDY  . Negative sleep study  01-03-2012  in epic  . Knee arthroscopy with medial menisectomy Right 12/31/2013    Procedure: RIGHT KNEE ARTHROSCOPY PARTIAL MEDIAL MENISCECTOMY DEBRIDEMENT AND CHONDROPLASTY ;  Surgeon: Sydnee Cabal, MD;  Location: [facility];  Service: Orthopedics;  Laterality: Right;  . Hernia repair    . Lumbar laminectomy/decompression microdiscectomy Left 03/15/2015    Procedure: Left Lumbar five- sacral one microdiskectomy;  Surgeon: Consuella Lose, MD;  Location: [place] NEURO ORS;  Service: Neurosurgery;  Laterality: Left;  Left L5S1 microdiskectomy   Family History  Problem Relation Age of Onset  . Arrhythmia Father   . Heart attack Brother   . Prostate cancer Maternal Grandfather   . Colon cancer Maternal Grandfather   . Lung cancer Mother     smoker  . Hypertension Mother   . Hyperlipidemia Mother   . Other Maternal Grandmother     harding of the arteries   Social History  Substance Use Topics  . Smoking status: Never Smoker   . Smokeless tobacco: Never Used  . Alcohol Use: 0.0 oz/week    0 Standard drinks or equivalent per  week     Comment: rare    Review of Systems  All other systems reviewed and are negative.   Allergies  Morphine  Home Medications   Prior to Admission medications   Medication Sig Start Date End Date Taking? Authorizing Provider  Abacavir-Dolutegravir-Lamivud [id number] MG TABS Take 1 tablet by mouth daily. 01/27/15   Thayer Headings, MD  diltiazem [facility]) 360 MG 24 hr capsule Take 1 capsule (360 mg total) by mouth daily. 12/28/14   Thompson Grayer, MD  ELIQUIS 5 MG TABS tablet Take 1 tablet by mouth  twice a day 05/10/15   Thompson Grayer, MD  flecainide (TAMBOCOR) 150 MG tablet Take 300 mg by mouth 2 (two) times daily as needed (A FIB).     Historical Provider, MD  HYDROcodone-acetaminophen (NORCO/VICODIN) 5-325 MG per tablet Take 1-2 tablets by mouth every 6 (six) hours as needed for moderate pain. 03/15/15   Consuella Lose, MD   BP 166/101 mmHg  Pulse 85  Temp(Src) 97.7 F (36.5 C) (Oral)  Resp 18  SpO2 95% Physical Exam  Constitutional: He is oriented to person, place, and time. He appears well-developed and well-nourished. No distress.  HENT:  Head: Normocephalic and atraumatic.  Eyes: EOM are normal. Pupils are equal, round, and reactive to light.  Neck: Normal range of motion. Neck supple.  Cardiovascular: Normal rate, regular rhythm and normal heart sounds.  Exam reveals no gallop and no friction rub.   No murmur heard. Pulmonary/Chest: Effort normal. He has no wheezes. He has no rales. He exhibits no tenderness.  Prolonged exhalation phase. No rales or wheezes.   Abdominal: Soft. Bowel sounds are normal. He exhibits no distension and no mass. There is no tenderness.  Musculoskeletal: Normal range of motion. He exhibits no edema.  Neurological: He is alert and oriented to person, place, and time. No cranial nerve deficit. He exhibits normal muscle tone. Coordination normal.  Skin: Skin is warm and dry. No rash noted.  Psychiatric: He has a normal mood and affect. His behavior is normal. Judgment and thought content normal.  Nursing note and vitals reviewed.   ED Course  Procedures (including critical care time) DIAGNOSTIC STUDIES: Oxygen Saturation is 95% on RA, adequate by my interpretation.    COORDINATION OF CARE: 2:59 AM Discussed treatment plan with pt at bedside which includes CXR and breathing treatment and pt agreed to plan.  Imaging Review Dg Chest 2 View  05/19/2015  CLINICAL DATA:  Productive cough for 7 days.  Shortness of breath. EXAM: CHEST  2 VIEW COMPARISON:  08/30/2014 FINDINGS: Normal heart size and pulmonary vascularity. Linear atelectasis or fibrosis in the lung bases. No focal airspace  disease or consolidation in the lungs. No blunting of costophrenic angles. No pneumothorax. No change since prior study. IMPRESSION: Linear atelectasis or fibrosis in the lung bases. No evidence of active pulmonary disease. Electronically Signed   By: Lucienne Capers M.D.   On: 05/19/2015 03:37   I have personally reviewed and evaluated these images as part of my medical decision-making.  MDM   Final diagnoses:  Nonproductive cough    Cough which is currently nonproductive. There is some bronchospastic component. Old records are reviewed showing he has HIV disease but last CD4 count was done in June and was 700 and viral load was undetectable at that time. He is compliant with HIV medications, so risk of pneumocystis carina pneumonia is very low. He is sent for chest x-ray which shows no evidence of pneumonia. He  is given albuterol with ipratropium via nebulizer with significant improvement in cough. He does have hydrocodone-acetaminophen at home leftover from recent surgery. He is given a prescription for albuterol inhaler and advised to use hydrocodone-acetaminophen as needed to help suppress cough at night so he can sleep.  I personally performed the services described in this documentation, which was scribed in my presence. The recorded information has been reviewed and is accurate.      Delora Fuel, MD [id number]

## 2015-05-25 ENCOUNTER — Other Ambulatory Visit: Payer: 59

## 2015-05-25 DIAGNOSIS — B20 Human immunodeficiency virus [HIV] disease: Secondary | ICD-10-CM

## 2015-05-25 LAB — CBC WITH DIFFERENTIAL/PLATELET
Basophils Absolute: 0.1 10*3/uL (ref 0.0–0.1)
Basophils Relative: 1 % (ref 0–1)
Eosinophils Absolute: 0.2 10*3/uL (ref 0.0–0.7)
Eosinophils Relative: 4 % (ref 0–5)
HCT: 41.5 % (ref 39.0–52.0)
Hemoglobin: 14.6 g/dL (ref 13.0–17.0)
Lymphocytes Relative: 37 % (ref 12–46)
Lymphs Abs: 2.3 10*3/uL (ref 0.7–4.0)
MCH: 31.2 pg (ref 26.0–34.0)
MCHC: 35.2 g/dL (ref 30.0–36.0)
MCV: 88.7 fL (ref 78.0–100.0)
MPV: 9.4 fL (ref 8.6–12.4)
Monocytes Absolute: 0.7 10*3/uL (ref 0.1–1.0)
Monocytes Relative: 11 % (ref 3–12)
Neutro Abs: 2.9 10*3/uL (ref 1.7–7.7)
Neutrophils Relative %: 47 % (ref 43–77)
Platelets: 248 10*3/uL (ref 150–400)
RBC: 4.68 MIL/uL (ref 4.22–5.81)
RDW: 14.7 % (ref 11.5–15.5)
WBC: 6.2 10*3/uL (ref 4.0–10.5)

## 2015-05-25 LAB — COMPLETE METABOLIC PANEL WITH GFR
ALT: 32 U/L (ref 9–46)
AST: 30 U/L (ref 10–35)
Albumin: 4 g/dL (ref 3.6–5.1)
Alkaline Phosphatase: 65 U/L (ref 40–115)
BUN: 15 mg/dL (ref 7–25)
CO2: 21 mmol/L (ref 20–31)
Calcium: 9.2 mg/dL (ref 8.6–10.3)
Chloride: 105 mmol/L (ref 98–110)
Creat: 1.03 mg/dL (ref 0.70–1.33)
GFR, Est African American: >89 mL/min (ref >=60)
GFR, Est Non African American: 82 mL/min (ref >=60)
Glucose, Bld: 108 mg/dL — ABNORMAL HIGH (ref 65–99)
Potassium: 4 mmol/L (ref 3.5–5.3)
Sodium: 138 mmol/L (ref 135–146)
Total Bilirubin: 0.7 mg/dL (ref 0.2–1.2)
Total Protein: 7.4 g/dL (ref 6.1–8.1)

## 2015-05-26 LAB — T-HELPER CELL (CD4) - (RCID CLINIC ONLY)
CD4 % Helper T Cell: 32 % — ABNORMAL LOW (ref 33–55)
CD4 T Cell Abs: 800 /uL (ref 400–2700)

## 2015-05-28 LAB — HIV-1 RNA QUANT-NO REFLEX-BLD
HIV 1 RNA Quant: <20 copies/mL (ref <20)
HIV-1 RNA Quant, Log: <1.3 Log copies/mL (ref <1.30)

## 2015-06-08 ENCOUNTER — Ambulatory Visit (INDEPENDENT_AMBULATORY_CARE_PROVIDER_SITE_OTHER): Payer: 59 | Admitting: Internal Medicine

## 2015-06-08 ENCOUNTER — Encounter: Payer: Self-pay | Admitting: Internal Medicine

## 2015-06-08 VITALS — BP 144/92 | HR 62 | Temp 98.0°F | Wt 262.0 lb

## 2015-06-08 DIAGNOSIS — Z79899 Other long term (current) drug therapy: Secondary | ICD-10-CM

## 2015-06-08 DIAGNOSIS — Z113 Encounter for screening for infections with a predominantly sexual mode of transmission: Secondary | ICD-10-CM | POA: Insufficient documentation

## 2015-06-08 DIAGNOSIS — B20 Human immunodeficiency virus [HIV] disease: Secondary | ICD-10-CM

## 2015-06-08 DIAGNOSIS — R5383 Other fatigue: Secondary | ICD-10-CM | POA: Diagnosis not present

## 2015-06-08 NOTE — Progress Notes (Signed)
   Subjective:    Patient ID: Ethan Lambert, male    DOB: [birth year], 54 y.o.   MRN: [id number]  HPI  Here for follow up of HIV.   Last seen December 2014 and on Atripla for many years.  I changed him to Triumeq and taking daily.  No issues and pleased with new regimen.  No missed doses, no n/v/d.  Labs reviewed, CD4 800, viral load undetectable, creat ok.  Had back surgery and weight gain from being sedentary.  Now back to work.  Feels well.  More energy now off of Atripla.   Review of Systems  Constitutional: Negative for activity change and fatigue.  HENT: Negative for trouble swallowing.   Gastrointestinal: Negative for nausea and diarrhea.  Skin: Negative for rash.  Neurological: Negative for dizziness and light-headedness.       Objective:   Physical Exam  Constitutional: He appears well-developed and well-nourished. No distress.  HENT:  Mouth/Throat: No oropharyngeal exudate.  Eyes: No scleral icterus.  Cardiovascular: Normal rate, regular rhythm and normal heart sounds.   No murmur heard. Pulmonary/Chest: Effort normal and breath sounds normal. No respiratory distress. He has no wheezes.  Lymphadenopathy:    He has no cervical adenopathy.  Skin: No rash noted.          Assessment & Plan:

## 2015-06-08 NOTE — Assessment & Plan Note (Signed)
Doing good on new regimen and improved side effect profile. RTC 6 months.

## 2015-06-08 NOTE — Assessment & Plan Note (Signed)
Now resolved, sleeping better.

## 2015-07-11 ENCOUNTER — Inpatient Hospital Stay (HOSPITAL_COMMUNITY): Admission: RE | Admit: 2015-07-11 | Payer: Self-pay | Source: Ambulatory Visit | Admitting: Nurse Practitioner

## 2015-09-13 ENCOUNTER — Other Ambulatory Visit: Payer: Self-pay | Admitting: Internal Medicine

## 2015-10-15 ENCOUNTER — Other Ambulatory Visit: Payer: Self-pay | Admitting: Internal Medicine

## 2015-10-16 ENCOUNTER — Other Ambulatory Visit: Payer: Self-pay | Admitting: Internal Medicine

## 2015-11-27 ENCOUNTER — Other Ambulatory Visit: Payer: 59

## 2015-11-27 DIAGNOSIS — Z79899 Other long term (current) drug therapy: Secondary | ICD-10-CM

## 2015-11-27 DIAGNOSIS — Z113 Encounter for screening for infections with a predominantly sexual mode of transmission: Secondary | ICD-10-CM

## 2015-11-27 DIAGNOSIS — B20 Human immunodeficiency virus [HIV] disease: Secondary | ICD-10-CM

## 2015-11-27 LAB — CBC WITH DIFFERENTIAL/PLATELET
Basophils Absolute: 66 cells/uL (ref 0–200)
Basophils Relative: 1 %
Eosinophils Absolute: 198 cells/uL (ref 15–500)
Eosinophils Relative: 3 %
HCT: 43.6 % (ref 38.5–50.0)
Hemoglobin: 14.8 g/dL (ref 13.2–17.1)
Lymphocytes Relative: 31 %
Lymphs Abs: 2046 cells/uL (ref 850–3900)
MCH: 30.7 pg (ref 27.0–33.0)
MCHC: 33.9 g/dL (ref 32.0–36.0)
MCV: 90.5 fL (ref 80.0–100.0)
MPV: 9.5 fL (ref 7.5–12.5)
Monocytes Absolute: 726 cells/uL (ref 200–950)
Monocytes Relative: 11 %
Neutro Abs: 3564 cells/uL (ref 1500–7800)
Neutrophils Relative %: 54 %
Platelets: 229 10*3/uL (ref 140–400)
RBC: 4.82 MIL/uL (ref 4.20–5.80)
RDW: 14.5 % (ref 11.0–15.0)
WBC: 6.6 10*3/uL (ref 3.8–10.8)

## 2015-11-27 LAB — LIPID PANEL
Cholesterol: 183 mg/dL (ref 125–200)
HDL: 32 mg/dL — ABNORMAL LOW (ref >=40)
LDL Cholesterol: 115 mg/dL (ref <130)
Total CHOL/HDL Ratio: 5.7 Ratio — ABNORMAL HIGH (ref <=5.0)
Triglycerides: 178 mg/dL — ABNORMAL HIGH (ref <150)
VLDL: 36 mg/dL — ABNORMAL HIGH (ref <30)

## 2015-11-27 LAB — COMPLETE METABOLIC PANEL WITH GFR
ALT: 18 U/L (ref 9–46)
AST: 21 U/L (ref 10–35)
Albumin: 4.1 g/dL (ref 3.6–5.1)
Alkaline Phosphatase: 67 U/L (ref 40–115)
BUN: 15 mg/dL (ref 7–25)
CO2: 22 mmol/L (ref 20–31)
Calcium: 8.7 mg/dL (ref 8.6–10.3)
Chloride: 106 mmol/L (ref 98–110)
Creat: 0.91 mg/dL (ref 0.70–1.33)
GFR, Est African American: >89 mL/min (ref >=60)
GFR, Est Non African American: >89 mL/min (ref >=60)
Glucose, Bld: 112 mg/dL — ABNORMAL HIGH (ref 65–99)
Potassium: 4 mmol/L (ref 3.5–5.3)
Sodium: 140 mmol/L (ref 135–146)
Total Bilirubin: 0.8 mg/dL (ref 0.2–1.2)
Total Protein: 6.8 g/dL (ref 6.1–8.1)

## 2015-11-28 LAB — URINE CYTOLOGY ANCILLARY ONLY
Chlamydia: NEGATIVE
Neisseria Gonorrhea: NEGATIVE

## 2015-11-28 LAB — RPR

## 2015-11-28 LAB — T-HELPER CELL (CD4) - (RCID CLINIC ONLY)
CD4 % Helper T Cell: 36 % (ref 33–55)
CD4 T Cell Abs: 740 /uL (ref 400–2700)

## 2015-11-28 LAB — HIV-1 RNA QUANT-NO REFLEX-BLD
HIV 1 RNA Quant: <20 copies/mL (ref <20)
HIV-1 RNA Quant, Log: <1.3 Log copies/mL (ref <1.30)

## 2015-12-10 ENCOUNTER — Other Ambulatory Visit: Payer: Self-pay | Admitting: Internal Medicine

## 2015-12-11 ENCOUNTER — Ambulatory Visit: Payer: Self-pay | Admitting: Internal Medicine

## 2015-12-27 ENCOUNTER — Encounter: Payer: Self-pay | Admitting: Gastroenterology

## 2016-01-03 ENCOUNTER — Encounter: Payer: Self-pay | Admitting: Internal Medicine

## 2016-01-03 ENCOUNTER — Ambulatory Visit (INDEPENDENT_AMBULATORY_CARE_PROVIDER_SITE_OTHER): Payer: 59 | Admitting: Internal Medicine

## 2016-01-03 VITALS — BP 130/82 | HR 68 | Temp 98.5°F | Ht 73.0 in | Wt 257.0 lb

## 2016-01-03 DIAGNOSIS — R5383 Other fatigue: Secondary | ICD-10-CM

## 2016-01-03 DIAGNOSIS — B20 Human immunodeficiency virus [HIV] disease: Secondary | ICD-10-CM | POA: Diagnosis not present

## 2016-01-03 NOTE — Progress Notes (Signed)
   Subjective:    Patient ID: Ethan Lambert, male    DOB: [birth year], 55 y.o.   MRN: [id number]  HPI  Here for follow up of HIV.   Previously on Atripla and  I changed him to Triumeq and taking daily.  No issues and pleased with new regimen.  No missed doses, no n/v/d.  Labs reviewed, CD4 740, viral load undetectable, creat ok.  Now back to work.  Feels well.  More energy now off of Atripla. May be taking care of his partner's niece's new baby due to her drug use.    Review of Systems  Constitutional: Negative for activity change and fatigue.  HENT: Negative for trouble swallowing.   Gastrointestinal: Negative for nausea and diarrhea.  Skin: Negative for rash.  Neurological: Negative for dizziness and light-headedness.       Objective:   Physical Exam  Constitutional: He appears well-developed and well-nourished. No distress.  HENT:  Mouth/Throat: No oropharyngeal exudate.  Eyes: No scleral icterus.  Cardiovascular: Normal rate, regular rhythm and normal heart sounds.   No murmur heard. Pulmonary/Chest: Effort normal and breath sounds normal. No respiratory distress. He has no wheezes.  Lymphadenopathy:    He has no cervical adenopathy.  Skin: No rash noted.          Assessment & Plan:

## 2016-01-03 NOTE — Assessment & Plan Note (Signed)
Improved with change to [employer]

## 2016-01-03 NOTE — Assessment & Plan Note (Signed)
Doing great.  rtc 6 months.  

## 2016-02-01 ENCOUNTER — Other Ambulatory Visit: Payer: Self-pay | Admitting: Internal Medicine

## 2016-02-13 ENCOUNTER — Telehealth: Payer: Self-pay | Admitting: Internal Medicine

## 2016-02-13 NOTE — Telephone Encounter (Signed)
Ethan Lambert is calling because he is having issues w/ AFIB it is happening more frequently . States he has had maybe 4 episodes within the past 3 weeks . Please call

## 2016-02-13 NOTE — Telephone Encounter (Signed)
Called pt and discussed options re weekly afib epiosdes, burden has increased. He states that he has gained some weight lately and has increased caffeine by way ofsoft drinks. Advised to cut back on caffeine and calories as possible triggers. I also discussed using daily flecainide vrs" PIP" flecainide which is still working for pt to convert afib episodes. He wants to defer daily flecainide if possible but is interested in discussing options possibly  an ablation. He has an appointment with Dr. Rayann Heman 9/18 at 3pm. He will work on the above lifestyle issues.

## 2016-02-22 ENCOUNTER — Encounter: Payer: Self-pay | Admitting: Internal Medicine

## 2016-03-11 ENCOUNTER — Encounter: Payer: Self-pay | Admitting: Internal Medicine

## 2016-03-11 ENCOUNTER — Ambulatory Visit (INDEPENDENT_AMBULATORY_CARE_PROVIDER_SITE_OTHER): Payer: 59 | Admitting: Internal Medicine

## 2016-03-11 ENCOUNTER — Encounter (INDEPENDENT_AMBULATORY_CARE_PROVIDER_SITE_OTHER): Payer: Self-pay

## 2016-03-11 VITALS — BP 126/80 | HR 72 | Ht 73.0 in | Wt 259.4 lb

## 2016-03-11 DIAGNOSIS — I480 Paroxysmal atrial fibrillation: Secondary | ICD-10-CM | POA: Diagnosis not present

## 2016-03-11 DIAGNOSIS — R0683 Snoring: Secondary | ICD-10-CM | POA: Diagnosis not present

## 2016-03-11 MED ORDER — FLECAINIDE ACETATE 150 MG PO TABS: 300.0000 mg | ORAL_TABLET | Freq: Two times a day (BID) | ORAL | 3 refills | Status: DC | PRN

## 2016-03-11 NOTE — Progress Notes (Signed)
Electrophysiology Office Note   Date:  03/11/2016   ID:  Ethan Lambert, DOB Jul 03, 1960, MRN [id number]  PCP:  none  Primary Electrophysiologist: Thompson Grayer, MD    Chief Complaint  Patient presents with  . Atrial Fibrillation     History of Present Illness: Ethan Lambert is a 55 y.o. male who presents today for electrophysiology evaluation.   He continues to have afib.  Episodes occur about once per month and typically improve with flecainide.  Recently he had an episode with associated SOB.  He could not get up his steps at home to get his flecainide.  He had to call EMS.  He found this episode very worrisome and is fearful of recurrent afib.  He reports compliance with eliquis.   He finds that afib is more likely to occur if he lays on his L side. He snores at night and frequently stops breathing.  He has had sleep study several years ago but was unable to fall asleep at the time.  He is interested in a home sleep study. Today, he denies symptoms of palpitations, chest pain, shortness of breath, orthopnea, PND, lower extremity edema, claudication, dizziness, presyncope, syncope, bleeding, or neurologic sequela. The patient is tolerating medications without difficulties and is otherwise without complaint today.    Past Medical History:  Diagnosis Date  . Anemia   . Blood transfusion without reported diagnosis   . Cancer ([place])    skin  . Dysrhythmia   . Family history of malignant neoplasm of gastrointestinal tract   . 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 History of kidney stones   . History of nonmelanoma skin cancer   . HIV disease ([place]) dx 1999   MONITORED BY INFECTIOUS DISEASE -- DR [place]  . Hypertension   . Paroxysmal atrial fibrillation (HCC)    CARDIOLOGIST--  DR Rayann Heman  . Right knee meniscal tear    Past Surgical History:  Procedure Laterality Date  . APPENDECTOMY  06-09-2003    . HERNIA REPAIR    . INGUINAL HERNIA REPAIR Bilateral 1990's  . KNEE ARTHROSCOPY WITH MEDIAL MENISECTOMY Right 12/31/2013   Procedure: RIGHT KNEE ARTHROSCOPY PARTIAL MEDIAL MENISCECTOMY DEBRIDEMENT AND CHONDROPLASTY ;  Surgeon: Sydnee Cabal, MD;  Location: [facility];  Service: Orthopedics;  Laterality: Right;  . LAPAROSCOPIC CHOLECYSTECTOMY  07-08-2003  . LASER ABLATION ANAL CONDYLOMA  05-18-2001  . LUMBAR LAMINECTOMY/DECOMPRESSION MICRODISCECTOMY Left 03/15/2015   Procedure: Left Lumbar five- sacral one microdiskectomy;  Surgeon: Consuella Lose, MD;  Location: [facility];  Service: Neurosurgery;  Laterality: Left;  Left L5S1 microdiskectomy  . NEGATIVE SLEEP STUDY  01-03-2012  in epic  . TRANSTHORACIC ECHOCARDIOGRAM  12-01-2008   MODERATE LVH/  EF 55-60%/  MILD MR/  NEGATIVE BUBBLE STUDY     Current Outpatient Prescriptions  Medication Sig Dispense Refill  . apixaban (ELIQUIS) 5 MG TABS tablet Take 1 tablet (5 mg total) by mouth 2 (two) times daily. Patient is overdue for an appointment. Please call and schedule for further refills 180 tablet 0  . diltiazem (TIAZAC) 360 MG 24 hr capsule Take 360 mg by mouth daily.    . flecainide (TAMBOCOR) 150 MG tablet Take 300 mg by mouth 2 (two) times daily as needed (A FIB).    . TRIUMEQ 600-50-300 MG tablet TAKE 1 TABLET BY MOUTH DAILY. 30 tablet 5   No current facility-administered medications for this visit.  Allergies:   Morphine   Social History:  The patient  reports that he has never smoked. He has never used smokeless tobacco. He reports that he drinks alcohol. He reports that he does not use drugs.   Family History:  The patient's  family history includes Arrhythmia in his father; Colon cancer in his maternal grandfather; Heart attack in his brother; Hyperlipidemia in his mother; Hypertension in his mother; Lung cancer in his mother; Other in his maternal grandmother; Prostate cancer in his maternal grandfather.     ROS:  Please see the history of present illness.   All other systems are reviewed and negative.    PHYSICAL EXAM: VS:  BP 126/80   Pulse 72   Ht 6\' 1"  (1.854 m)   Wt 259 lb 6.4 oz (117.7 kg)   BMI 34.22 kg/m  , BMI Body mass index is 34.22 kg/m. GEN: Well nourished, well developed, in no acute distress  HEENT: normal  Neck: no JVD, carotid bruits, or masses Cardiac: RRR; no murmurs, rubs, or gallops,no edema  Respiratory:  clear to auscultation bilaterally, normal work of breathing GI: soft, nontender, nondistended, + BS MS: no deformity or atrophy  Skin: warm and dry  Neuro:  Strength and sensation are intact Psych: euthymic mood, full affect  EKG:  EKG is ordered today. The ekg ordered today shows sinus rhythm 72 bpm, PR 202 msec,  LAHB   Recent Labs: 11/27/2015: ALT 18; BUN 15; Creat 0.91; Hemoglobin 14.8; Platelets 229; Potassium 4.0; Sodium 140    Lipid Panel     Component Value Date/Time   CHOL 183 11/27/2015 0915   TRIG 178 (H) 11/27/2015 0915   TRIG 369 02/16/2010 0000   HDL 32 (L) 11/27/2015 0915   CHOLHDL 5.7 (H) 11/27/2015 0915   VLDL 36 (H) 11/27/2015 0915   LDLCALC 115 11/27/2015 0915     Wt Readings from Last 3 Encounters:  03/11/16 259 lb 6.4 oz (117.7 kg)  01/03/16 257 lb (116.6 kg)  06/08/15 262 lb (118.8 kg)      Other studies Reviewed: Additional studies/ records that were reviewed today include AF clinic notes  ASSESSMENT AND PLAN:  1.  Paroxysmal atrial fibrillation Recently increased burden Therapeutic strategies for afib including medicine and ablation were discussed in detail with the patient today. Risk, benefits, and alternatives to EP study and radiofrequency ablation for afib were also discussed in detail today.  I did also offer flecainide 100mg  BID.  At this time, he would prefer to continue pill in pocket flecainide.  He will work on lifestyle change. Given prior stroke, would continue long term anticoagulation. Echo to  evaluate AF.  2. Hypertension Stable No change required today  3. Snoring He snores at night and frequently stops breathing.  He has had sleep study several years ago but was unable to fall asleep at the time.  He is interested in a home sleep study.  I will refer to Dr Oneal Grout for consideration of home sleep study and management of snoring.  Follow-up:  Return to see me in 2 months.  Current medicines are reviewed at length with the patient today.   The patient does not have concerns regarding his medicines.  The following changes were made today:  none   Signed, Thompson Grayer, MD  03/11/2016 3:31 PM     [facility] [address] [place] [address] [phone] (office) [phone] (fax)

## 2016-03-11 NOTE — Patient Instructions (Signed)
Medication Instructions:  Your physician recommends that you continue on your current medications as directed. Please refer to the Current Medication list given to you today.   Labwork: None ordered   Testing/Procedures: Your physician has requested that you have an echocardiogram. Echocardiography is a painless test that uses sound waves to create images of your heart. It provides your doctor with information about the size and shape of your heart and how well your heart's chambers and valves are working. This procedure takes approximately one hour. There are no restrictions for this procedure.    Follow-Up: You have been referred to Dr Lelon Mast   Your physician recommends that you schedule a follow-up appointment in: 2 months with Dr Rayann Heman   Any Other Special Instructions Will Be Listed Below (If Applicable).     If you need a refill on your cardiac medications before your next appointment, please call your pharmacy.

## 2016-03-13 ENCOUNTER — Other Ambulatory Visit: Payer: Self-pay

## 2016-03-13 ENCOUNTER — Ambulatory Visit (HOSPITAL_COMMUNITY): Payer: 59 | Attending: Cardiology

## 2016-03-13 DIAGNOSIS — B20 Human immunodeficiency virus [HIV] disease: Secondary | ICD-10-CM | POA: Diagnosis not present

## 2016-03-13 DIAGNOSIS — I480 Paroxysmal atrial fibrillation: Secondary | ICD-10-CM

## 2016-03-13 DIAGNOSIS — I517 Cardiomegaly: Secondary | ICD-10-CM | POA: Insufficient documentation

## 2016-03-13 DIAGNOSIS — I4891 Unspecified atrial fibrillation: Secondary | ICD-10-CM | POA: Diagnosis present

## 2016-03-13 DIAGNOSIS — I351 Nonrheumatic aortic (valve) insufficiency: Secondary | ICD-10-CM | POA: Insufficient documentation

## 2016-03-13 DIAGNOSIS — Z8249 Family history of ischemic heart disease and other diseases of the circulatory system: Secondary | ICD-10-CM | POA: Insufficient documentation

## 2016-04-28 ENCOUNTER — Other Ambulatory Visit: Payer: Self-pay | Admitting: Internal Medicine

## 2016-05-15 ENCOUNTER — Ambulatory Visit: Payer: Self-pay | Admitting: Internal Medicine

## 2016-06-28 ENCOUNTER — Other Ambulatory Visit: Payer: Self-pay

## 2016-06-28 MED ORDER — ABACAVIR-DOLUTEGRAVIR-LAMIVUD 600-50-300 MG PO TABS: 1.0000 | ORAL_TABLET | Freq: Every day | ORAL | 5 refills | Status: DC

## 2016-06-30 ENCOUNTER — Encounter (HOSPITAL_COMMUNITY): Payer: Self-pay | Admitting: Emergency Medicine

## 2016-06-30 ENCOUNTER — Ambulatory Visit (HOSPITAL_COMMUNITY)
Admission: EM | Admit: 2016-06-30 | Discharge: 2016-06-30 | Disposition: A | Discharge status: Home / Self Care | Payer: 59 | Attending: Family Medicine | Admitting: Family Medicine

## 2016-06-30 DIAGNOSIS — R69 Illness, unspecified: Secondary | ICD-10-CM

## 2016-06-30 DIAGNOSIS — J111 Influenza due to unidentified influenza virus with other respiratory manifestations: Secondary | ICD-10-CM

## 2016-06-30 MED ORDER — TOBRAMYCIN-DEXAMETHASONE 0.3-0.1 % OP SUSP: 1.0000 [drp] | OPHTHALMIC | 0 refills | Status: DC

## 2016-06-30 MED ORDER — IPRATROPIUM BROMIDE 0.06 % NA SOLN: 2.0000 | Freq: Four times a day (QID) | NASAL | 1 refills | Status: DC

## 2016-06-30 MED ORDER — AZITHROMYCIN 250 MG PO TABS: ORAL_TABLET | ORAL | 0 refills | Status: DC

## 2016-06-30 NOTE — Discharge Instructions (Signed)
Drink plenty of fluids as discussed, use medicine as prescribed, and mucinex or delsym for cough. Return or see your doctor if further problems °

## 2016-06-30 NOTE — ED Provider Notes (Signed)
[facility]    CSN: [id number] Arrival date & time: 06/30/16  1205     History   Chief Complaint Chief Complaint  Patient presents with  . URI    HPI Ethan Lambert is a 56 y.o. male.   The history is provided by the patient.  URI  Presenting symptoms: congestion, cough and rhinorrhea   Presenting symptoms: no fever   Severity:  Moderate Onset quality:  Gradual Duration:  1 week Progression:  Worsening Chronicity:  New Risk factors: sick contacts     Past Medical History:  Diagnosis Date  . Anemia   . Blood transfusion without reported diagnosis   . Cancer ([place])    skin  . Dysrhythmia   . Family history of malignant neoplasm of gastrointestinal tract   . 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 History of kidney stones   . History of nonmelanoma skin cancer   . HIV disease ([place]) dx 1999   MONITORED BY INFECTIOUS DISEASE -- DR [place]  . Hypertension   . Paroxysmal atrial fibrillation (HCC)    CARDIOLOGIST--  DR Rayann Heman  . Right knee meniscal tear     Patient Active Problem List   Diagnosis Date Noted  . Screening examination for venereal disease 06/08/2015  . Encounter for long-term (current) use of medications 06/08/2015  . HNP (herniated nucleus pulposus), lumbar 03/15/2015  . Fatigue 01/26/2015  . S/P right knee arthroscopy 12/31/2013  . INGUINAL HERNIORRHAPHIES, BILATERAL, HX OF 01/15/2010  . VARICOCELE 07/05/2009  . HEMATOSPERMIA 06/27/2009  . Essential hypertension 12/08/2008  . Atrial fibrillation ([place]) 12/08/2008  . Cerebral artery occlusion with cerebral infarction ([place]) 12/08/2008  . Human immunodeficiency virus disease ([place]) 11/18/2006  . TENDINITIS, ELBOW 11/18/2006    Past Surgical History:  Procedure Laterality Date  . APPENDECTOMY  06-09-2003  . HERNIA REPAIR    . INGUINAL HERNIA REPAIR Bilateral 1990's  . KNEE ARTHROSCOPY WITH MEDIAL  MENISECTOMY Right 12/31/2013   Procedure: RIGHT KNEE ARTHROSCOPY PARTIAL MEDIAL MENISCECTOMY DEBRIDEMENT AND CHONDROPLASTY ;  Surgeon: Sydnee Cabal, MD;  Location: [facility];  Service: Orthopedics;  Laterality: Right;  . LAPAROSCOPIC CHOLECYSTECTOMY  07-08-2003  . LASER ABLATION ANAL CONDYLOMA  05-18-2001  . LUMBAR LAMINECTOMY/DECOMPRESSION MICRODISCECTOMY Left 03/15/2015   Procedure: Left Lumbar five- sacral one microdiskectomy;  Surgeon: Consuella Lose, MD;  Location: [facility];  Service: Neurosurgery;  Laterality: Left;  Left L5S1 microdiskectomy  . NEGATIVE SLEEP STUDY  01-03-2012  in epic  . TRANSTHORACIC ECHOCARDIOGRAM  12-01-2008   MODERATE LVH/  EF 55-60%/  MILD MR/  NEGATIVE BUBBLE STUDY       Home Medications    Prior to Admission medications   Medication Sig Start Date End Date Taking? Authorizing Provider  abacavir-dolutegravir-lamiVUDine (TRIUMEQ) 600-50-300 MG tablet Take 1 tablet by mouth daily. 06/28/16   Thayer Headings, MD  diltiazem [facility]) 360 MG 24 hr capsule TAKE 1 CAPSULE BY MOUTH  DAILY 04/30/16   Thompson Grayer, MD  ELIQUIS 5 MG TABS tablet TAKE 1 TABLET BY MOUTH  TWICE A DAY 04/30/16   Thompson Grayer, MD  flecainide (TAMBOCOR) 150 MG tablet Take 2 tablets (300 mg total) by mouth 2 (two) times daily as needed (A FIB). 03/11/16   Thompson Grayer, MD    Family History Family History  Problem Relation Age of Onset  . Arrhythmia Father   . Heart attack Brother   .  Prostate cancer Maternal Grandfather   . Colon cancer Maternal Grandfather   . Lung cancer Mother     smoker  . Hypertension Mother   . Hyperlipidemia Mother   . Other Maternal Grandmother     harding of the arteries    Social History Social History  Substance Use Topics  . Smoking status: Never Smoker  . Smokeless tobacco: Never Used  . Alcohol use 0.0 oz/week     Comment: rarely; 1-2 per month     Allergies   Morphine   Review of Systems Review of Systems    Constitutional: Negative for chills and fever.  HENT: Positive for congestion, postnasal drip and rhinorrhea.   Eyes: Positive for redness.  Respiratory: Positive for cough. Negative for shortness of breath.   Cardiovascular: Negative.   Gastrointestinal: Negative.   All other systems reviewed and are negative.    Physical Exam Triage Vital Signs ED Triage Vitals  Enc Vitals Group     BP 06/30/16 1229 127/73     Pulse Rate 06/30/16 1229 74     Resp 06/30/16 1229 16     Temp 06/30/16 1229 98.3 F (36.8 C)     Temp Source 06/30/16 1229 Oral     SpO2 06/30/16 1229 96 %     Weight 06/30/16 1233 260 lb (117.9 kg)     Height 06/30/16 1233 6\' 1"  (1.854 m)     Head Circumference --      Peak Flow --      Pain Score 06/30/16 1234 4     Pain Loc --      Pain Edu? --      Excl. in [place]? --    No data found.   Updated Vital Signs BP 127/73 (BP Location: Left Arm)   Pulse 74   Temp 98.3 F (36.8 C) (Oral)   Resp 16   Ht 6\' 1"  (1.854 m)   Wt 260 lb (117.9 kg)   SpO2 96%   BMI 34.30 kg/m   Visual Acuity Right Eye Distance:   Left Eye Distance:   Bilateral Distance:    Right Eye Near:   Left Eye Near:    Bilateral Near:     Physical Exam  Constitutional: He is oriented to person, place, and time. He appears well-developed and well-nourished.  HENT:  Right Ear: External ear normal.  Left Ear: External ear normal.  Nose: Nose normal.  Mouth/Throat: Oropharynx is clear and moist.  Eyes: EOM are normal. Pupils are equal, round, and reactive to light. Right conjunctiva is injected. Left conjunctiva is injected.  Neck: Normal range of motion. Neck supple.  Cardiovascular: Normal rate and regular rhythm.   Pulmonary/Chest: Effort normal and breath sounds normal.  Lymphadenopathy:    He has cervical adenopathy.  Neurological: He is alert and oriented to person, place, and time.  Skin: Skin is warm and dry.  Nursing note and vitals reviewed.    UC Treatments / Results   Labs (all labs ordered are listed, but only abnormal results are displayed) Labs Reviewed - No data to display  EKG  EKG Interpretation None       Radiology No results found.  Procedures Procedures (including critical care time)  Medications Ordered in UC Medications - No data to display   Initial Impression / Assessment and Plan / UC Course  I have reviewed the triage vital signs and the nursing notes.  Pertinent labs & imaging results that were available during my care  of the patient were reviewed by me and considered in my medical decision making (see chart for details).  Clinical Course       Final Clinical Impressions(s) / UC Diagnoses   Final diagnoses:  None    New Prescriptions New Prescriptions   No medications on file     Billy Fischer, MD 06/30/16 1351

## 2016-06-30 NOTE — ED Triage Notes (Signed)
PT reports productive cough, sore throat, runny eyes, green mucus, facial pain for 1 week. PT has used robitussin and ibuprofen at home.

## 2016-07-01 ENCOUNTER — Other Ambulatory Visit: Payer: Self-pay | Admitting: Pharmacist

## 2016-07-01 MED ORDER — ABACAVIR-DOLUTEGRAVIR-LAMIVUD 600-50-300 MG PO TABS: 1.0000 | ORAL_TABLET | Freq: Every day | ORAL | 5 refills | Status: DC

## 2016-09-26 ENCOUNTER — Ambulatory Visit (INDEPENDENT_AMBULATORY_CARE_PROVIDER_SITE_OTHER): Payer: 59 | Admitting: Physician Assistant

## 2016-09-26 ENCOUNTER — Ambulatory Visit (INDEPENDENT_AMBULATORY_CARE_PROVIDER_SITE_OTHER): Payer: 59

## 2016-09-26 ENCOUNTER — Encounter: Payer: Self-pay | Admitting: Physician Assistant

## 2016-09-26 VITALS — BP 132/87 | HR 61 | Temp 98.6°F | Resp 16 | Wt 262.4 lb

## 2016-09-26 DIAGNOSIS — M19019 Primary osteoarthritis, unspecified shoulder: Secondary | ICD-10-CM | POA: Diagnosis not present

## 2016-09-26 DIAGNOSIS — M25511 Pain in right shoulder: Secondary | ICD-10-CM

## 2016-09-26 MED ORDER — CYCLOBENZAPRINE HCL 10 MG PO TABS: 10.0000 mg | ORAL_TABLET | Freq: Three times a day (TID) | ORAL | 0 refills | Status: DC | PRN

## 2016-09-26 MED ORDER — HYDROCODONE-ACETAMINOPHEN 5-325 MG PO TABS: 1.0000 | ORAL_TABLET | Freq: Four times a day (QID) | ORAL | 0 refills | Status: DC | PRN

## 2016-09-26 NOTE — Progress Notes (Signed)
THIS NOTE IS USED FOR EDUCATIONAL PURPOSES ONLY!!!   Name: Ethan Lambert  DOB: Mar 05, 1961  Age: 56 y.o. Sex: male  CC:  Chief Complaint  Patient presents with  . Shoulder Injury    right 09/25/16 lifting concreate lid and heard "pop" cant lift or reach now w/o pain    PCP: No PCP Per Patient  HPI: Patient reports to [place] today with complaints of Right shoulder pain since yesterday. Patient reports that he was lifting 100lbs worth of concrete from the ground and turning to place it elsewhere and felt a pop in his right shoulder. He reports that the pain is 0/10 when he is lying still. He reports pain is increased with movement, especially with flexion/extension/ER of the shoulder. He denies previous injuries to this shoulder. He reports taking ibuprofen x3 yesterday and has not taken anything today. He denies any weakness in the shoulder, denies numbness/tingling down the right arm and into the neck. 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   ROS:  Constitutional: Negative for activity change, appetite change, fatigue and unexpected weight change.  HENT: Negative for congestion, dental problem, ear pain, hearing loss, mouth sores, postnasal drip, rhinorrhea, sneezing, sore throat, tinnitus and trouble swallowing.   Eyes: Negative for photophobia, pain, redness and visual disturbance.  Respiratory: Negative for cough, chest tightness and shortness of breath.   Cardiovascular: Negative for chest pain, palpitations and leg swelling.  Gastrointestinal: Negative for abdominal pain, blood in stool, constipation, diarrhea, nausea and vomiting.  Genitourinary: Negative for dysuria, frequency, hematuria and urgency.  Musculoskeletal: Negative for arthralgias, gait problem, myalgias and neck stiffness.  Skin: Negative for rash.  Neurological: Negative for dizziness, speech difficulty, weakness, light-headedness, numbness and headaches.   Hematological: Negative for adenopathy.  Psychiatric/Behavioral: Negative for confusion and sleep disturbance. The patient is not nervous/anxious.    PMH:  Patient Active Problem List   Diagnosis Date Noted  . Shoulder pain, right 09/26/2016  . Screening examination for venereal disease 06/08/2015  . Encounter for long-term (current) use of medications 06/08/2015  . HNP (herniated nucleus pulposus), lumbar 03/15/2015  . Fatigue 01/26/2015  . S/P right knee arthroscopy 12/31/2013  . INGUINAL HERNIORRHAPHIES, BILATERAL, HX OF 01/15/2010  . VARICOCELE 07/05/2009  . HEMATOSPERMIA 06/27/2009  . Essential hypertension 12/08/2008  . Atrial fibrillation ([place]) 12/08/2008  . Cerebral artery occlusion with cerebral infarction ([place]) 12/08/2008  . Human immunodeficiency virus disease ([place]) 11/18/2006  . TENDINITIS, ELBOW 11/18/2006    Allergies:  Allergies  Allergen Reactions  . Morphine Other (See Comments)    hallucinations    Medications:  Current Outpatient Prescriptions on File Prior to Visit  Medication Sig Dispense Refill  . abacavir-dolutegravir-lamiVUDine (TRIUMEQ) 600-50-300 MG tablet Take 1 tablet by mouth daily. 30 tablet 5  . diltiazem (TIAZAC) 360 MG 24 hr capsule TAKE 1 CAPSULE BY MOUTH  DAILY 90 capsule 2  . ELIQUIS 5 MG TABS tablet TAKE 1 TABLET BY MOUTH  TWICE A DAY 180 tablet 2  . flecainide (TAMBOCOR) 150 MG tablet Take 2 tablets (300 mg total) by mouth 2 (two) times daily as needed (A FIB). 30 tablet 3  . azithromycin (ZITHROMAX Z-PAK) 250 MG tablet Take as directed on pack (Patient not taking: Reported on 09/26/2016) 6 tablet 0  . ipratropium (ATROVENT) 0.06 % nasal spray Place 2 sprays into both nostrils 4 (four) times daily. (Patient not taking: Reported on 09/26/2016) 15 mL 1  .  tobramycin-dexamethasone (TOBRADEX) ophthalmic solution Place 1 drop into both eyes every 4 (four) hours while awake. (Patient not taking: Reported on 09/26/2016) 5 mL 0   No current  facility-administered medications on file prior to visit.     PE:  GS: WDWN male sitting on exam table in NAD.  Vitals: BP (!) 144/89 (Cuff Size: Large)   Pulse 61   Temp 98.6 F (37 C) (Oral)   Resp 16   Wt 262 lb 6.4 oz (119 kg)   SpO2 97%   BMI 34.62 kg/m  HEENT: Normocephalic, atruamatic. PEARRL. No cervical lymphadenopathy. No thyroid nodules, normal size, and equal bilaterally. Ears: Bilateral ears without erythema, TM clear with good Ethan of light. TM without bulging. Nose: Patent, without erythema or discharge. Mouth/Throat: Moist mucous membranes. No erythema. Tonsils without erythema or tonsillar exudate.  Cardiovascular: RRR. No S3 or S4. No murmurs, rubs, or gallops. Pulses 2+ and equal bilateral in the upper and lower extremities. No pitting edema. No varicosities, clubbing, or cyanosis.  Pulm: CTA bilaterally. No expiratory muscle use while breathing.  MSK: Shoulder levels equal. No cervical neck TTP. TTP of this [place] joint on the right shoulder. No bony TTP of the right upper extremity. + Hawkins test, + Speeds test. Negative cross arm abduction test. Strength 5/5 in all directions at the shoulder and elbow bilaterally. Sensation intact at the distal upper extremities. Capillary refill <2 sec. Radial pulses 2+ and equal. Decreased passive and active ROM due to pain of the R shoulder with flexion, extension, ER and abduction. Full IR ROM.  Neuro: CN 2-12 grossly intact.  Psych: A&O x 4. Mood and affect appropriate for situation.  Skin: Warm and dry. No rashes or excoriations on exposed skin.   A&P:  1. Acute pain of right shoulder - most likely rotator cuff strain  Plan:  Imaging: DG Shoulder Right: No acute findings. AC joint osteoarthritis.  Pharm: Cyclobenzaprine (FLEXERIL) 10 MG tablet, HYDROcodone-acetaminophen ([place]) 5-325 MG tablet Note for work for light duty        Respectfully,  Delilah Shan, PA-S2

## 2016-09-26 NOTE — Progress Notes (Signed)
Patient ID: Ethan Lambert, male    DOB: [birth year], 56 y.o.   MRN: [id number]  PCP: No PCP Per Patient  Chief Complaint  Patient presents with  . Shoulder Injury    right 09/25/16 lifting concreate lid and heard "pop" cant lift or reach now w/o pain    Subjective:   Presents for evaluation of RIGHT shoulder pain that began yesterday after lifting the concrete lid of a septic tank.  Estimate weight of the lid is 100 lbs.  He was down in the hole, just above waist deep. He raised the lid out of the hole, about chest high, and twisted to the RIGHT to put it down when he felt and heard a POP in the RIGHT shoulder and immediately felt pain. He is unable to raise the arm or move it away from his body, due to the pain. No weakness, not dropping things, no paresthesias.  At rest/without movement, he is pain free.  He is RIGHT hand dominant. No previous shoulder/arm injury.  Ibuprofen and ice at home yesterday without benefit.    Review of Systems  Respiratory: Negative for shortness of breath.   Cardiovascular: Negative for chest pain.  Musculoskeletal: Positive for arthralgias and myalgias. Negative for back pain, gait problem, joint swelling, neck pain and neck stiffness.  Neurological: Negative for weakness and numbness.       Patient Active Problem List   Diagnosis Date Noted  . Shoulder pain, right 09/26/2016  . Screening examination for venereal disease 06/08/2015  . Encounter for long-term (current) use of medications 06/08/2015  . HNP (herniated nucleus pulposus), lumbar 03/15/2015  . Fatigue 01/26/2015  . S/P right knee arthroscopy 12/31/2013  . INGUINAL HERNIORRHAPHIES, BILATERAL, HX OF 01/15/2010  . VARICOCELE 07/05/2009  . HEMATOSPERMIA 06/27/2009  . Essential hypertension 12/08/2008  . Atrial fibrillation ([place]) 12/08/2008  . Cerebral artery occlusion with cerebral infarction ([place]) 12/08/2008  . Human immunodeficiency virus disease ([place]) 11/18/2006  .  TENDINITIS, ELBOW 11/18/2006     Prior to Admission medications   Medication Sig Start Date End Date Taking? Authorizing Provider  abacavir-dolutegravir-lamiVUDine (TRIUMEQ) 600-50-300 MG tablet Take 1 tablet by mouth daily. 07/01/16  Yes Thayer Headings, MD  diltiazem [facility]) 360 MG 24 hr capsule TAKE 1 CAPSULE BY MOUTH  DAILY 04/30/16  Yes Thompson Grayer, MD  ELIQUIS 5 MG TABS tablet TAKE 1 TABLET BY MOUTH  TWICE A DAY 04/30/16  Yes Thompson Grayer, MD  flecainide (TAMBOCOR) 150 MG tablet Take 2 tablets (300 mg total) by mouth 2 (two) times daily as needed (A FIB). 03/11/16  Yes Thompson Grayer, MD  azithromycin (ZITHROMAX Z-PAK) 250 MG tablet Take as directed on pack Patient not taking: Reported on 09/26/2016 06/30/16   Billy Fischer, MD  ipratropium (ATROVENT) 0.06 % nasal spray Place 2 sprays into both nostrils 4 (four) times daily. Patient not taking: Reported on 09/26/2016 06/30/16   Billy Fischer, MD  tobramycin-dexamethasone [facility]) ophthalmic solution Place 1 drop into both eyes every 4 (four) hours while awake. Patient not taking: Reported on 09/26/2016 06/30/16   Billy Fischer, MD     Allergies  Allergen Reactions  . Morphine Other (See Comments)    hallucinations       Objective:  Physical Exam  Constitutional: He is oriented to person, place, and time. He appears well-developed and well-nourished. He is active and cooperative. No distress.  BP 132/87   Pulse 61   Temp 98.6 F (37 C) (  Oral)   Resp 16   Wt 262 lb 6.4 oz (119 kg)   SpO2 97%   BMI 34.62 kg/m    Eyes: Conjunctivae are normal.  Pulmonary/Chest: Effort normal.  Musculoskeletal:       Right shoulder: He exhibits decreased range of motion, tenderness (AC joint and posterior) and pain. He exhibits no bony tenderness, no swelling, no effusion, no crepitus, no deformity, no laceration, no spasm, normal pulse and normal strength.       Left shoulder: Normal.       Right elbow: Normal.      Right wrist: Normal.       Right  upper arm: Normal.       Right forearm: Normal.       Arms:      Right hand: Normal.  Neurological: He is alert and oriented to person, place, and time.  Psychiatric: He has a normal mood and affect. His speech is normal and behavior is normal.       Dg Shoulder Right  Result Date: 09/26/2016 CLINICAL DATA:  Right shoulder pain. EXAM: RIGHT SHOULDER - 2+ VIEW COMPARISON:  05/19/2015 FINDINGS: There is no evidence of fracture or dislocation. Degenerative joint disease identified at the [facility] joint. Soft tissues are unremarkable. IMPRESSION: 1. No acute findings. 2. AC joint osteoarthritis. Electronically Signed   By: Kerby Moors M.D.   On: 09/26/2016 12:31       Assessment & Plan:   Problem List Items Addressed This Visit    Shoulder pain, right - Primary   Relevant Medications   cyclobenzaprine (FLEXERIL) 10 MG tablet   HYDROcodone-acetaminophen (NORCO) 5-325 MG tablet   Other Relevant Orders   DG Shoulder Right (Completed)   Acromioclavicular joint arthritis   Relevant Medications   cyclobenzaprine (FLEXERIL) 10 MG tablet   HYDROcodone-acetaminophen (NORCO) 5-325 MG tablet     Likely rotator cuff injury, but radiographs reveal OA of the AC joint. Stop ibuprofen, as he uses Eliquis. Ice. Rest. Muscle Relaxer and Opiate for pain relief.   Return if symptoms worsen or fail to improve.   Fara Chute, PA-C [facility] [place]

## 2016-09-26 NOTE — Patient Instructions (Addendum)
STOP the ibuprofen. It presents an increased bleeding risk and cannot be used with the Eliquis.  ICE. Rest. Return if you are not improving next week.    IF you received an x-ray today, you will receive an invoice from [facility]. Please contact [facility] at [phone] with questions or concerns regarding your invoice.   IF you received labwork today, you will receive an invoice from [employer]. Please contact [facility] at [phone] with questions or concerns regarding your invoice.   Our billing staff will not be able to assist you with questions regarding bills from these companies.  You will be contacted with the lab results as soon as they are available. The fastest way to get your results is to activate your My Chart account. Instructions are located on the last page of this paperwork. If you have not heard from [place] regarding the results in 2 weeks, please contact this office.

## 2016-10-01 ENCOUNTER — Ambulatory Visit (INDEPENDENT_AMBULATORY_CARE_PROVIDER_SITE_OTHER): Payer: 59 | Admitting: Emergency Medicine

## 2016-10-01 VITALS — BP 130/84 | HR 82 | Temp 98.2°F | Resp 17 | Ht 73.5 in | Wt 265.0 lb

## 2016-10-01 DIAGNOSIS — S43401A Unspecified sprain of right shoulder joint, initial encounter: Secondary | ICD-10-CM | POA: Insufficient documentation

## 2016-10-01 DIAGNOSIS — S43401D Unspecified sprain of right shoulder joint, subsequent encounter: Secondary | ICD-10-CM

## 2016-10-01 NOTE — Patient Instructions (Addendum)
Able to return to work full duties.    IF you received an x-ray today, you will receive an invoice from [facility]. Please contact [facility] at [phone] with questions or concerns regarding your invoice.   IF you received labwork today, you will receive an invoice from [facility]. Please contact [facility] at [phone] with questions or concerns regarding your invoice.   Our billing staff will not be able to assist you with questions regarding bills from these companies.  You will be contacted with the lab results as soon as they are available. The fastest way to get your results is to activate your My Chart account. Instructions are located on the last page of this paperwork. If you have not heard from [place] regarding the results in 2 weeks, please contact this office.

## 2016-10-01 NOTE — Progress Notes (Signed)
Ethan Lambert 56 y.o.   Chief Complaint  Patient presents with  . Follow-up    wants letter to return to work     HISTORY OF PRESENT ILLNESS: This is a 56 y.o. male s/p right shoulder injury 1 week ago; feels much better and ready to go back to full duties at work; needs work note.  HPI   Prior to Admission medications   Medication Sig Start Date End Date Taking? Authorizing Provider  abacavir-dolutegravir-lamiVUDine (TRIUMEQ) 600-50-300 MG tablet Take 1 tablet by mouth daily. 07/01/16  Yes Thayer Headings, MD  cyclobenzaprine (FLEXERIL) 10 MG tablet Take 1 tablet (10 mg total) by mouth 3 (three) times daily as needed for muscle spasms. 09/26/16  Yes Chelle Jeffery, PA-C  diltiazem (TIAZAC) 360 MG 24 hr capsule TAKE 1 CAPSULE BY MOUTH  DAILY 04/30/16  Yes Thompson Grayer, MD  ELIQUIS 5 MG TABS tablet TAKE 1 TABLET BY MOUTH  TWICE A DAY 04/30/16  Yes Thompson Grayer, MD  flecainide (TAMBOCOR) 150 MG tablet Take 2 tablets (300 mg total) by mouth 2 (two) times daily as needed (A FIB). 03/11/16  Yes Thompson Grayer, MD  ipratropium (ATROVENT) 0.06 % nasal spray Place 2 sprays into both nostrils 4 (four) times daily. 06/30/16  Yes Billy Fischer, MD    Allergies  Allergen Reactions  . Morphine Other (See Comments)    hallucinations    Patient Active Problem List   Diagnosis Date Noted  . Shoulder pain, right 09/26/2016  . Acromioclavicular joint arthritis 09/26/2016  . Screening examination for venereal disease 06/08/2015  . Encounter for long-term (current) use of medications 06/08/2015  . HNP (herniated nucleus pulposus), lumbar 03/15/2015  . Fatigue 01/26/2015  . S/P right knee arthroscopy 12/31/2013  . INGUINAL HERNIORRHAPHIES, BILATERAL, HX OF 01/15/2010  . VARICOCELE 07/05/2009  . HEMATOSPERMIA 06/27/2009  . Essential hypertension 12/08/2008  . Atrial fibrillation ([place]) 12/08/2008  . Cerebral artery occlusion with cerebral infarction ([place]) 12/08/2008  . Human immunodeficiency virus  disease ([place]) 11/18/2006  . TENDINITIS, ELBOW 11/18/2006    Past Medical History:  Diagnosis Date  . Anemia   . Blood transfusion without reported diagnosis   . Cancer ([place])    skin  . Dysrhythmia   . Family history of malignant neoplasm of gastrointestinal tract   . 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 History of kidney stones   . History of nonmelanoma skin cancer   . HIV disease ([place]) dx 1999   MONITORED BY INFECTIOUS DISEASE -- DR [place]  . Hypertension   . Paroxysmal atrial fibrillation ([facility])    CARDIOLOGIST--  DR Rayann Heman  . Right knee meniscal tear     Past Surgical History:  Procedure Laterality Date  . APPENDECTOMY  06-09-2003  . HERNIA REPAIR    . INGUINAL HERNIA REPAIR Bilateral 1990's  . KNEE ARTHROSCOPY WITH MEDIAL MENISECTOMY Right 12/31/2013   Procedure: RIGHT KNEE ARTHROSCOPY PARTIAL MEDIAL MENISCECTOMY DEBRIDEMENT AND CHONDROPLASTY ;  Surgeon: Sydnee Cabal, MD;  Location: [place];  Service: Orthopedics;  Laterality: Right;  . LAPAROSCOPIC CHOLECYSTECTOMY  07-08-2003  . LASER ABLATION ANAL CONDYLOMA  05-18-2001  . LUMBAR LAMINECTOMY/DECOMPRESSION MICRODISCECTOMY Left 03/15/2015   Procedure: Left Lumbar five- sacral one microdiskectomy;  Surgeon: Consuella Lose, MD;  Location: [place] NEURO ORS;  Service: Neurosurgery;  Laterality: Left;  Left L5S1 microdiskectomy  . NEGATIVE SLEEP STUDY  01-03-2012  in epic  .  TRANSTHORACIC ECHOCARDIOGRAM  12-01-2008   MODERATE LVH/  EF 55-60%/  MILD MR/  NEGATIVE BUBBLE STUDY    Social History   Social History  . Marital status: Married    Spouse name: N/A  . Number of children: 1  . Years of education: N/A   Occupational History  . [employer] CLERK Database administrator   Social History Main Topics  . Smoking status: Never Smoker  . Smokeless tobacco: Never Used  . Alcohol use 0.0 oz/week     Comment: rarely; 1-2 per  month  . Drug use: No  . Sexual activity: Not on file   Other Topics Concern  . Not on file   Social History Narrative   Pt lives in [place].  Unemployed.    Family History  Problem Relation Age of Onset  . Arrhythmia Father   . Heart attack Brother   . Prostate cancer Maternal Grandfather   . Colon cancer Maternal Grandfather   . Lung cancer Mother     smoker  . Hypertension Mother   . Hyperlipidemia Mother   . Other Maternal Grandmother     harding of the arteries     Review of Systems  Musculoskeletal: Positive for joint pain (right shoulder: improved).  All other systems reviewed and are negative.    Physical Exam  Constitutional: He is oriented to person, place, and time. He appears well-developed and well-nourished.  HENT:  Head: Normocephalic and atraumatic.  Eyes: EOM are normal. Pupils are equal, round, and reactive to light.  Neck: Normal range of motion.  Cardiovascular: Normal rate.   Pulmonary/Chest: Effort normal.  Musculoskeletal:  Right shoulder: FROM, non-tender  Neurological: He is alert and oriented to person, place, and time.  Skin: Skin is warm and dry.  Psychiatric: He has a normal mood and affect. His behavior is normal.  Vitals reviewed.    ASSESSMENT & PLAN: Ethan Lambert was seen today for follow-up.  Diagnoses and all orders for this visit:  Sprain of right shoulder, unspecified shoulder sprain type, subsequent encounter Comments: resolved   Able to return to work full duties. No restrictions or limitations. Note written.   Agustina Caroli, MD [facility]

## 2016-11-27 ENCOUNTER — Other Ambulatory Visit: Payer: Self-pay | Admitting: Internal Medicine

## 2016-11-27 DIAGNOSIS — Z5181 Encounter for therapeutic drug level monitoring: Secondary | ICD-10-CM

## 2016-11-27 DIAGNOSIS — I4891 Unspecified atrial fibrillation: Secondary | ICD-10-CM

## 2016-11-27 IMAGING — MR MR LUMBAR SPINE W/O CM
5 series · 42 of 48 positions shown · non-contrast
Comparison: Lumbar radiographs 02/08/2015

CLINICAL DATA: Left-sided low back pain.  Left leg pain

EXAM:
MRI LUMBAR SPINE WITHOUT CONTRAST
TECHNIQUE: Multiplanar, multisequence MR imaging of the lumbar spine was
performed. No intravenous contrast was administered.

[Series 3: T2 · sagittal · 4.0mm · 0.88mm/px · 7 of 15 slices shown (1 of 2)]
[im 1/15]
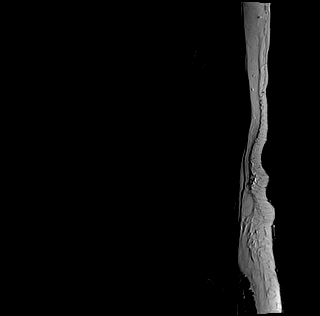
[im 3/15]
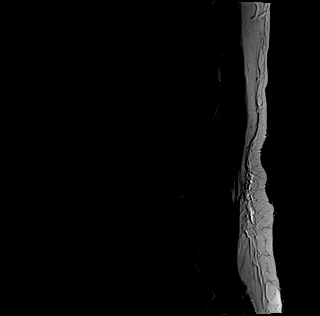
[im 5/15]
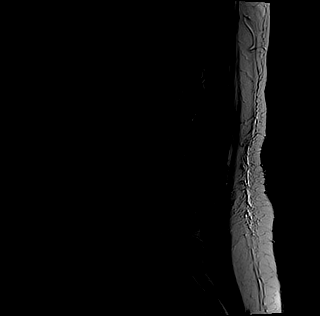
[im 8/15]
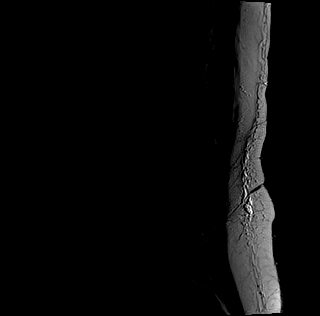
[im 10/15]
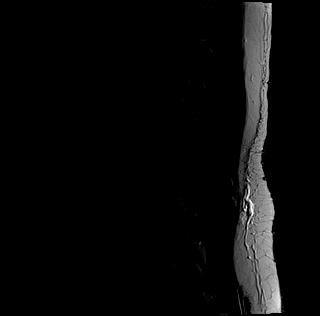
[im 12/15]
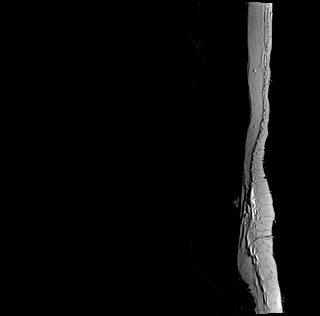
[im 15/15]
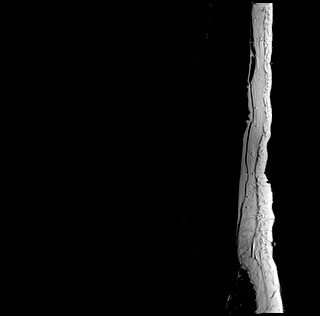

[Series 4: tirm sag · sagittal · 4.0mm · 0.55mm/px · 7 of 15 slices shown]
[im 1/15]
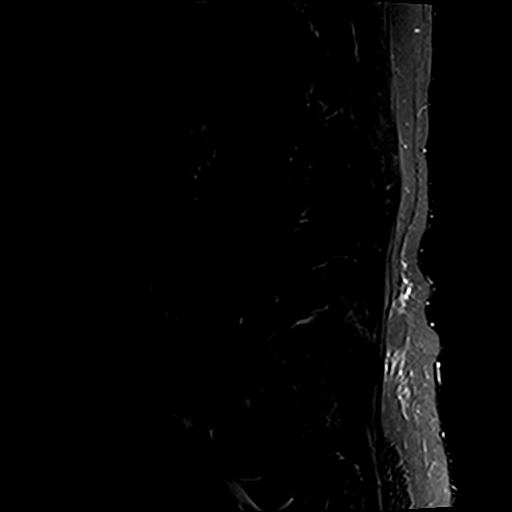
[im 3/15]
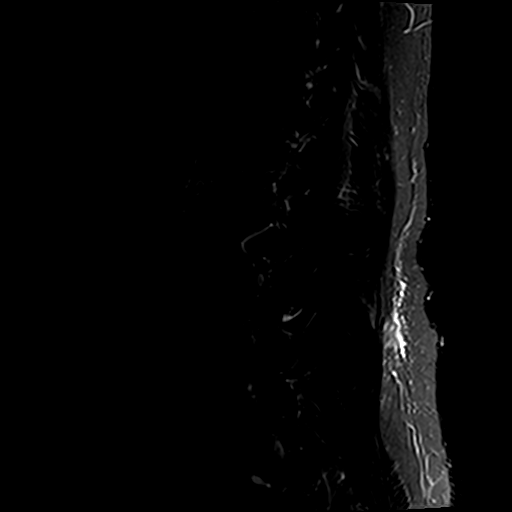
[im 5/15]
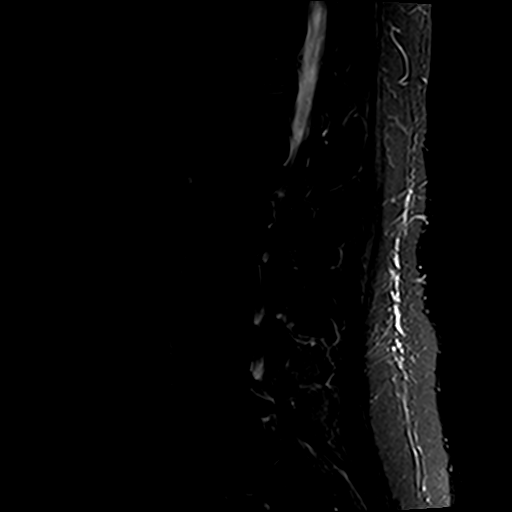
[im 8/15]
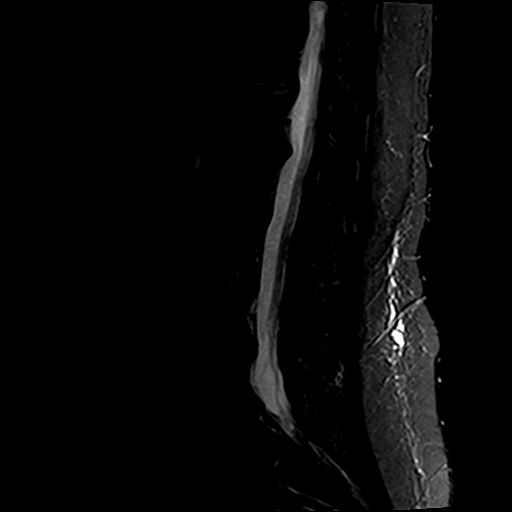
[im 10/15]
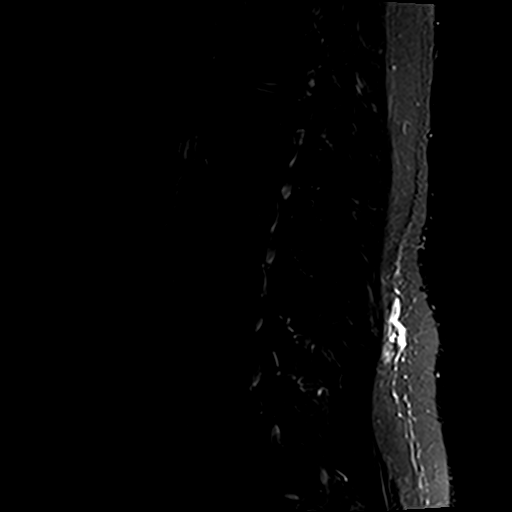
[im 12/15]
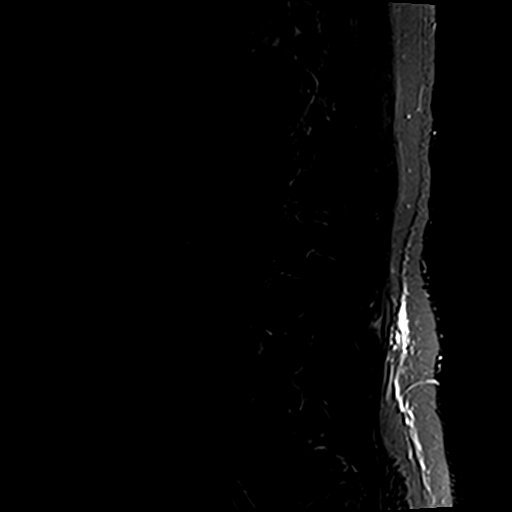
[im 15/15]
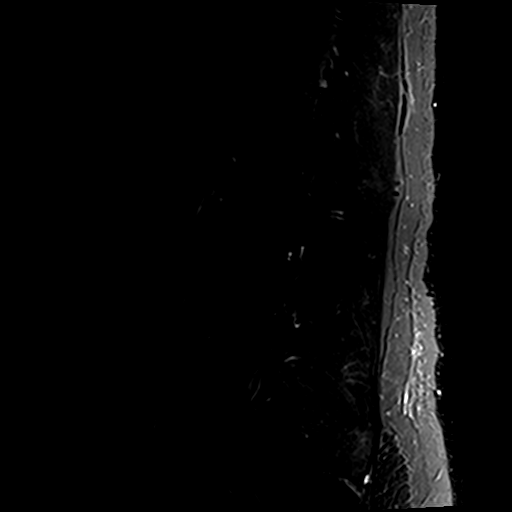

[Series 5: T1 · sagittal · 4.0mm · 0.88mm/px · 6 of 15 slices shown (1 of 2)]
[im 1/15]
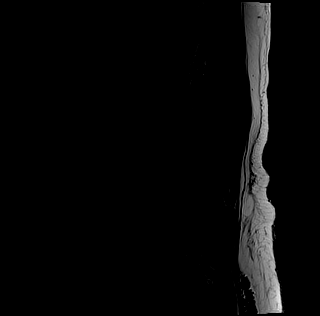
[im 3/15]
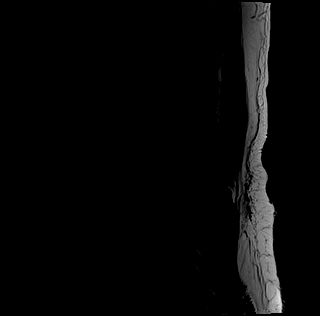
[im 6/15]
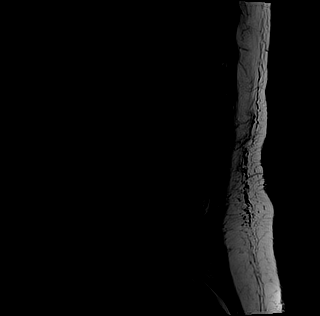
[im 9/15]
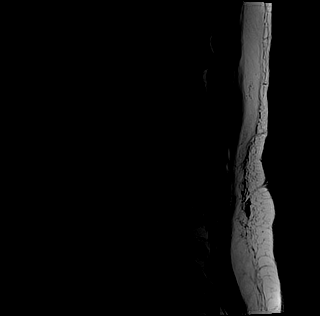
[im 12/15]
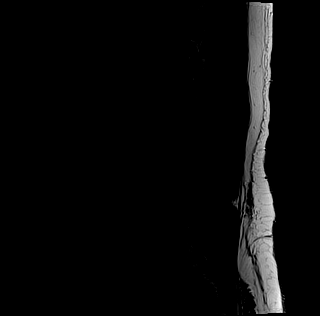
[im 15/15]
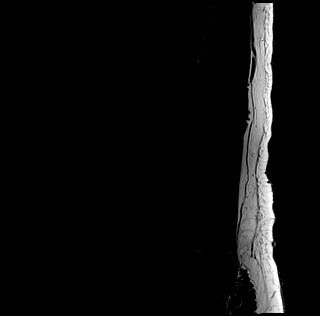

[Series 6: T1 · axial · 4.0mm · 0.70mm/px · z∈[-71,+117]mm · 8 of 33 slices shown (2 of 2)]
[im 1/33]
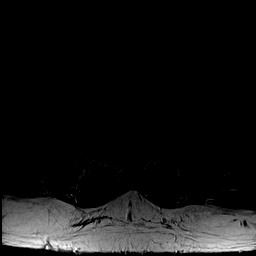
[im 5/33]
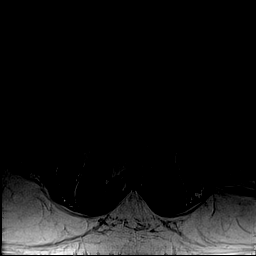
[im 10/33]
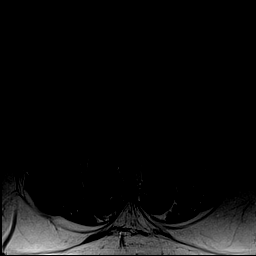
[im 15/33]
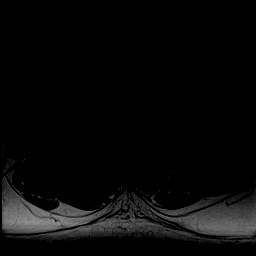
[im 18/33]
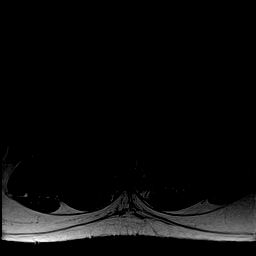
[im 23/33]
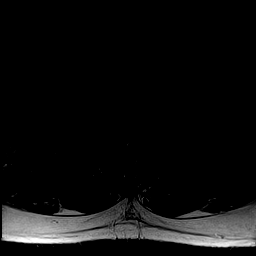
[im 28/33]
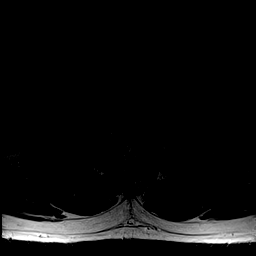
[im 33/33]
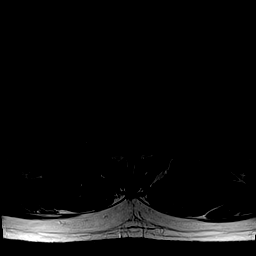

[Series 7: T2 · axial · 4.0mm · 0.70mm/px · z∈[-71,+117]mm · 14 of 33 slices shown (2 of 2)]
[im 1/33]
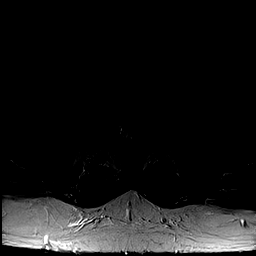
[im 3/33]
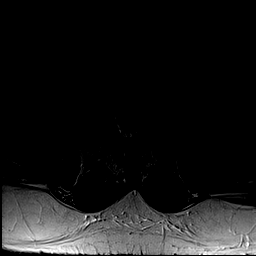
[im 5/33]
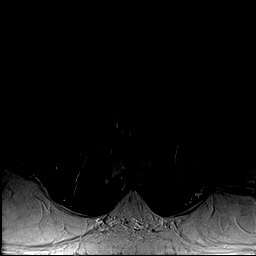
[im 8/33]
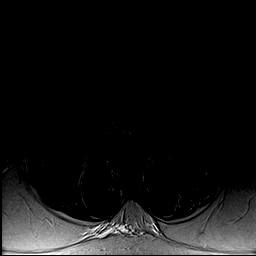
[im 10/33]
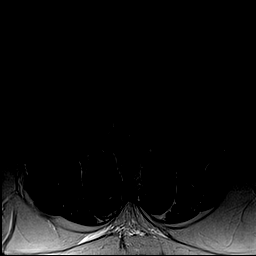
[im 13/33]
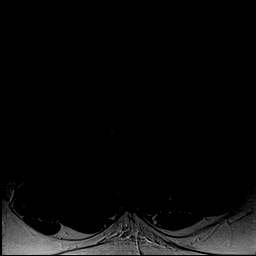
[im 15/33]
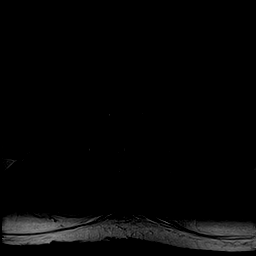
[im 18/33]
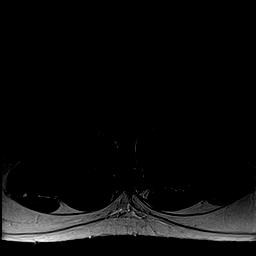
[im 20/33]
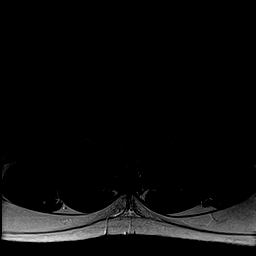
[im 23/33]
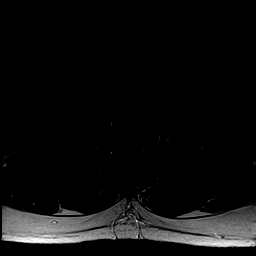
[im 25/33]
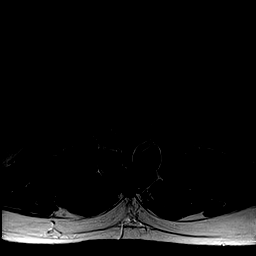
[im 28/33]
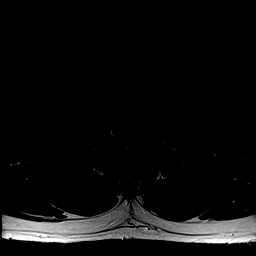
[im 30/33]
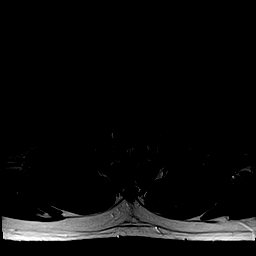
[im 33/33]
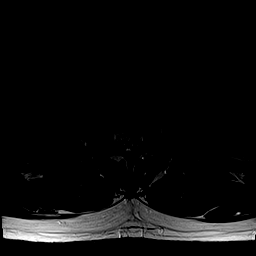

[42 of 48 positions shown; findings below may reference images not displayed]

FINDINGS: Normal lumbar alignment. Negative for fracture or mass lesion. Conus
medullaris normal and terminates at L1-2

L1-2: Disc degeneration with disc bulging. Mild anterior spurring
accounting for x-ray abnormality. No lesion of the pedicle as
questioned on x-ray. No significant spinal stenosis. Mild facet
degeneration.

L2-3: Mild disc degeneration and disc bulging. Mild facet
degeneration without significant spinal stenosis

L3-4:  Mild disc and facet degeneration without stenosis

L4-5: Mild disc bulging and mild facet hypertrophy without
significant spinal stenosis

L5-S1: Moderately large left-sided disc protrusion with impingement
of the left S1 nerve root. S1 nerve root is flattened and displaced.
No significant spinal stenosis.
IMPRESSION: Moderately large left-sided disc protrusion L5-S1 with compression
of the left S1 nerve root

Mild degenerative changes at other levels as described above.

## 2016-11-27 NOTE — Telephone Encounter (Signed)
Pt last saw Dr Rayann Heman on 03/11/16, last labs on 11/27/15 Creat 0.91, weight 120.2kg, age 56 based on specified criteria pt is on appropriate dosage of Eliquis 5mg  BID.  Pt needs repeat CBC and BMP for future refills.  Will refill current rx x one 3 month supply and await lab results for future refills.  Checked with [place], PharmD pt's current anti viral med Perlie Gold is not contraindicated with Eliquis.

## 2016-11-27 NOTE — Telephone Encounter (Signed)
Pt called back, advised pt needs to come in for CBC and BMP prior to next refill in 3 months.  Pt states he will call back to schedule lab appointment.  Orders for BMP and CBC placed in Epic.

## 2016-12-08 ENCOUNTER — Other Ambulatory Visit: Payer: Self-pay | Admitting: Internal Medicine

## 2016-12-08 ENCOUNTER — Encounter (HOSPITAL_COMMUNITY): Payer: Self-pay

## 2016-12-08 ENCOUNTER — Emergency Department (HOSPITAL_COMMUNITY): Payer: 59

## 2016-12-08 ENCOUNTER — Emergency Department (HOSPITAL_COMMUNITY)
Admission: EM | Admit: 2016-12-08 | Discharge: 2016-12-08 | Disposition: A | Discharge status: Home / Self Care | Payer: 59 | Attending: Emergency Medicine | Admitting: Emergency Medicine

## 2016-12-08 DIAGNOSIS — Z85828 Personal history of other malignant neoplasm of skin: Secondary | ICD-10-CM | POA: Diagnosis not present

## 2016-12-08 DIAGNOSIS — I4891 Unspecified atrial fibrillation: Secondary | ICD-10-CM | POA: Diagnosis present

## 2016-12-08 DIAGNOSIS — Z8673 Personal history of transient ischemic attack (TIA), and cerebral infarction without residual deficits: Secondary | ICD-10-CM | POA: Diagnosis not present

## 2016-12-08 DIAGNOSIS — Z79899 Other long term (current) drug therapy: Secondary | ICD-10-CM | POA: Diagnosis not present

## 2016-12-08 DIAGNOSIS — I10 Essential (primary) hypertension: Secondary | ICD-10-CM | POA: Insufficient documentation

## 2016-12-08 DIAGNOSIS — Z7901 Long term (current) use of anticoagulants: Secondary | ICD-10-CM | POA: Insufficient documentation

## 2016-12-08 DIAGNOSIS — I480 Paroxysmal atrial fibrillation: Secondary | ICD-10-CM | POA: Diagnosis not present

## 2016-12-08 LAB — CBC
HCT: 46.4 % (ref 39.0–52.0)
Hemoglobin: 15.2 g/dL (ref 13.0–17.0)
MCH: 30.6 pg (ref 26.0–34.0)
MCHC: 32.8 g/dL (ref 30.0–36.0)
MCV: 93.5 fL (ref 78.0–100.0)
Platelets: 244 10*3/uL (ref 150–400)
RBC: 4.96 MIL/uL (ref 4.22–5.81)
RDW: 13.8 % (ref 11.5–15.5)
WBC: 5.9 10*3/uL (ref 4.0–10.5)

## 2016-12-08 LAB — BASIC METABOLIC PANEL
Anion gap: 7 (ref 5–15)
BUN: 11 mg/dL (ref 6–20)
CO2: 22 mmol/L (ref 22–32)
Calcium: 9.2 mg/dL (ref 8.9–10.3)
Chloride: 110 mmol/L (ref 101–111)
Creatinine, Ser: 0.99 mg/dL (ref 0.61–1.24)
GFR calc Af Amer: >60 mL/min (ref >60)
GFR calc non Af Amer: >60 mL/min (ref >60)
Glucose, Bld: 93 mg/dL (ref 65–99)
Potassium: 4.2 mmol/L (ref 3.5–5.1)
Sodium: 139 mmol/L (ref 135–145)

## 2016-12-08 NOTE — ED Notes (Signed)
Pt concerned after going back into afib after taking him meds, pt states he has never converted to NSR then back into afib in the past after taking meds. Pt states he became slighly shob when in a fib but nothing now and denies CP. Pt report to R calf area x 3 months, worse recently. Pt states pain intermittent with no precipitating factors. No redness or edema noted.  Pt A & O, on CCM. NSR

## 2016-12-08 NOTE — Discharge Instructions (Signed)
It was our pleasure to provide your ER care today - we hope that you feel better.  Continue your medications.  Follow up with your cardiologist in the next couple weeks.   Return to ER if worse, persistent fast heart beat, fainting, trouble breathing, other concern.

## 2016-12-08 NOTE — ED Triage Notes (Signed)
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  Pt took flecanide, converted out of A fib, then back into A fib, on way to ED pt converted back into NSR.  Pt reports "just feel tired".  No chest pain, shortness of breath at this time.

## 2016-12-08 NOTE — ED Provider Notes (Signed)
[facility] DEPT Provider Note   CSN: [id number] Arrival date & time: 12/08/16  1759     History   Chief Complaint Chief Complaint  Patient presents with  . Atrial Fibrillation    HPI KIENAN DOUBLIN is a 56 y.o. male.  Patient with hx afib, states had episode earlier today, took his flecainide, 45 minutes later in sinus, but then briefly recurred.  Now in sinus rhythm.  States recent health at baseline. Taking meds/eliquis as prescribed. No syncope. No chest pain or discomfort. No fever or chills.  Pt states otherwise feels well.    The history is provided by the patient.  Atrial Fibrillation  Pertinent negatives include no chest pain, no abdominal pain, no headaches and no shortness of breath.    Past Medical History:  Diagnosis Date  . Anemia   . Blood transfusion without reported diagnosis   . Cancer ([place])    skin  . Dysrhythmia   . Family history of malignant neoplasm of gastrointestinal tract   . 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 History of kidney stones   . History of nonmelanoma skin cancer   . HIV disease ([place]) dx 1999   MONITORED BY INFECTIOUS DISEASE -- DR [place]  . Hypertension   . Paroxysmal atrial fibrillation (HCC)    CARDIOLOGIST--  DR Rayann Heman  . Right knee meniscal tear     Patient Active Problem List   Diagnosis Date Noted  . Sprain of shoulder, right 10/01/2016  . Shoulder pain, right 09/26/2016  . Acromioclavicular joint arthritis 09/26/2016  . Screening examination for venereal disease 06/08/2015  . Encounter for long-term (current) use of medications 06/08/2015  . HNP (herniated nucleus pulposus), lumbar 03/15/2015  . Fatigue 01/26/2015  . S/P right knee arthroscopy 12/31/2013  . INGUINAL HERNIORRHAPHIES, BILATERAL, HX OF 01/15/2010  . VARICOCELE 07/05/2009  . HEMATOSPERMIA 06/27/2009  . Essential hypertension 12/08/2008  . Atrial fibrillation ([place])  12/08/2008  . Cerebral artery occlusion with cerebral infarction ([place]) 12/08/2008  . Human immunodeficiency virus disease ([place]) 11/18/2006    Past Surgical History:  Procedure Laterality Date  . APPENDECTOMY  06-09-2003  . HERNIA REPAIR    . INGUINAL HERNIA REPAIR Bilateral 1990's  . KNEE ARTHROSCOPY WITH MEDIAL MENISECTOMY Right 12/31/2013   Procedure: RIGHT KNEE ARTHROSCOPY PARTIAL MEDIAL MENISCECTOMY DEBRIDEMENT AND CHONDROPLASTY ;  Surgeon: Sydnee Cabal, MD;  Location: [facility];  Service: Orthopedics;  Laterality: Right;  . LAPAROSCOPIC CHOLECYSTECTOMY  07-08-2003  . LASER ABLATION ANAL CONDYLOMA  05-18-2001  . LUMBAR LAMINECTOMY/DECOMPRESSION MICRODISCECTOMY Left 03/15/2015   Procedure: Left Lumbar five- sacral one microdiskectomy;  Surgeon: Consuella Lose, MD;  Location: [facility];  Service: Neurosurgery;  Laterality: Left;  Left L5S1 microdiskectomy  . NEGATIVE SLEEP STUDY  01-03-2012  in epic  . TRANSTHORACIC ECHOCARDIOGRAM  12-01-2008   MODERATE LVH/  EF 55-60%/  MILD MR/  NEGATIVE BUBBLE STUDY       Home Medications    Prior to Admission medications   Medication Sig Start Date End Date Taking? Authorizing Provider  abacavir-dolutegravir-lamiVUDine (TRIUMEQ) 600-50-300 MG tablet Take 1 tablet by mouth daily. 07/01/16   Comer, Okey Regal, MD  cyclobenzaprine (FLEXERIL) 10 MG tablet Take 1 tablet (10 mg total) by mouth 3 (three) times daily as needed for muscle spasms. 09/26/16   Harrison Mons, PA-C  diltiazem (TIAZAC) 360 MG 24 hr capsule TAKE 1 CAPSULE BY MOUTH  DAILY 11/27/16  Allred, Jeneen Rinks, MD  ELIQUIS 5 MG TABS tablet TAKE 1 TABLET BY MOUTH  TWICE A DAY 11/27/16   Allred, Jeneen Rinks, MD  flecainide (TAMBOCOR) 150 MG tablet Take 2 tablets (300 mg total) by mouth 2 (two) times daily as needed (A FIB). 03/11/16   Allred, Jeneen Rinks, MD  ipratropium (ATROVENT) 0.06 % nasal spray Place 2 sprays into both nostrils 4 (four) times daily. 06/30/16   Billy Fischer, MD     Family History Family History  Problem Relation Age of Onset  . Arrhythmia Father   . Heart attack Brother   . Prostate cancer Maternal Grandfather   . Colon cancer Maternal Grandfather   . Lung cancer Mother        smoker  . Hypertension Mother   . Hyperlipidemia Mother   . Other Maternal Grandmother        harding of the arteries    Social History Social History  Substance Use Topics  . Smoking status: Never Smoker  . Smokeless tobacco: Never Used  . Alcohol use 0.0 oz/week     Comment: rarely; 1-2 per month     Allergies   Morphine   Review of Systems Review of Systems  Constitutional: Negative for chills and fever.  HENT: Negative for sore throat.   Eyes: Negative for redness.  Respiratory: Negative for shortness of breath.   Cardiovascular: Positive for palpitations. Negative for chest pain.  Gastrointestinal: Negative for abdominal pain and vomiting.  Genitourinary: Negative for flank pain.  Musculoskeletal: Negative for neck pain.  Skin: Negative for rash.  Neurological: Negative for syncope and headaches.  Hematological: Does not bruise/bleed easily.  Psychiatric/Behavioral: Negative for confusion.     Physical Exam Updated Vital Signs BP (!) 144/93   Pulse 65   Temp 98.3 F (36.8 C) (Oral)   Resp 19   SpO2 94%   Physical Exam  Constitutional: He appears well-developed and well-nourished. No distress.  HENT:  Mouth/Throat: Oropharynx is clear and moist.  Eyes: Conjunctivae are normal.  Neck: Neck supple. No tracheal deviation present. No thyromegaly present.  Cardiovascular: Normal rate, regular rhythm, normal heart sounds and intact distal pulses.  Exam reveals no gallop and no friction rub.   No murmur heard. Pulmonary/Chest: Effort normal and breath sounds normal. No accessory muscle usage. No respiratory distress.  Abdominal: Soft. Bowel sounds are normal. He exhibits no distension. There is no tenderness.  Musculoskeletal: He  exhibits no edema or tenderness.  Neurological: He is alert.  Skin: Skin is warm and dry. He is not diaphoretic.  Psychiatric: He has a normal mood and affect.  Nursing note and vitals reviewed.    ED Treatments / Results  Labs (all labs ordered are listed, but only abnormal results are displayed) Results for orders placed or performed during the hospital encounter of 96/75/91  Basic metabolic panel  Result Value Ref Range   Sodium 139 135 - 145 mmol/L   Potassium 4.2 3.5 - 5.1 mmol/L   Chloride 110 101 - 111 mmol/L   CO2 22 22 - 32 mmol/L   Glucose, Bld 93 65 - 99 mg/dL   BUN 11 6 - 20 mg/dL   Creatinine, Ser 0.99 0.61 - 1.24 mg/dL   Calcium 9.2 8.9 - 10.3 mg/dL   GFR calc non Af Amer >60 >60 mL/min   GFR calc Af Amer >60 >60 mL/min   Anion gap 7 5 - 15  CBC  Result Value Ref Range   WBC 5.9 4.0 -  10.5 K/uL   RBC 4.96 4.22 - 5.81 MIL/uL   Hemoglobin 15.2 13.0 - 17.0 g/dL   HCT 46.4 39.0 - 52.0 %   MCV 93.5 78.0 - 100.0 fL   MCH 30.6 26.0 - 34.0 pg   MCHC 32.8 30.0 - 36.0 g/dL   RDW 13.8 11.5 - 15.5 %   Platelets 244 150 - 400 K/uL   Dg Chest 2 View  Result Date: 12/08/2016 CLINICAL DATA:  Atrial fibrillation.  Sharp pain. EXAM: CHEST  2 VIEW COMPARISON:  May 19, 2015 FINDINGS: The heart size and mediastinal contours are within normal limits. Both lungs are clear. The visualized skeletal structures are unremarkable. IMPRESSION: No active cardiopulmonary disease. Electronically Signed   By: Dorise Bullion III M.D   On: 12/08/2016 19:06    EKG  EKG Interpretation  Date/Time:  Sunday December 08 2016 18:06:58 EDT Ventricular Rate:  74 PR Interval:  208 QRS Duration: 116 QT Interval:  392 QTC Calculation: 435 R Axis:   -102 Text Interpretation:  Normal sinus rhythm Right superior axis deviation No significant change since last tracing Confirmed by Ashok Cordia  MD, Lennette Bihari ([id number]) on 12/08/2016 7:24:18 PM       Radiology Dg Chest 2 View  Result Date:  12/08/2016 CLINICAL DATA:  Atrial fibrillation.  Sharp pain. EXAM: CHEST  2 VIEW COMPARISON:  May 19, 2015 FINDINGS: The heart size and mediastinal contours are within normal limits. Both lungs are clear. The visualized skeletal structures are unremarkable. IMPRESSION: No active cardiopulmonary disease. Electronically Signed   By: Dorise Bullion III M.D   On: 12/08/2016 19:06    Procedures Procedures (including critical care time)  Medications Ordered in ED Medications - No data to display   Initial Impression / Assessment and Plan / ED Course  I have reviewed the triage vital signs and the nursing notes.  Pertinent labs & imaging results that were available during my care of the patient were reviewed by me and considered in my medical decision making (see chart for details).  Iv ns. Labs. Monitor.  Pt remains in sinus rhythm, and is asymptomatic.   Reviewed nursing notes and prior charts for additional history.   Pt currently appears stable for d/c.    Final Clinical Impressions(s) / ED Diagnoses   Final diagnoses:  None    New Prescriptions New Prescriptions   No medications on file     Lajean Saver, MD 12/08/16 1930

## 2016-12-10 ENCOUNTER — Encounter (HOSPITAL_COMMUNITY): Payer: Self-pay | Admitting: Nurse Practitioner

## 2016-12-10 ENCOUNTER — Ambulatory Visit (HOSPITAL_COMMUNITY)
Admission: RE | Admit: 2016-12-10 | Discharge: 2016-12-10 | Disposition: A | Discharge status: Home / Self Care | Payer: 59 | Source: Ambulatory Visit | Attending: Nurse Practitioner | Admitting: Nurse Practitioner

## 2016-12-10 VITALS — BP 126/78 | HR 66 | Ht 73.5 in | Wt 258.6 lb

## 2016-12-10 DIAGNOSIS — Z87442 Personal history of urinary calculi: Secondary | ICD-10-CM | POA: Diagnosis not present

## 2016-12-10 DIAGNOSIS — Z8673 Personal history of transient ischemic attack (TIA), and cerebral infarction without residual deficits: Secondary | ICD-10-CM | POA: Diagnosis not present

## 2016-12-10 DIAGNOSIS — I10 Essential (primary) hypertension: Secondary | ICD-10-CM | POA: Insufficient documentation

## 2016-12-10 DIAGNOSIS — I480 Paroxysmal atrial fibrillation: Secondary | ICD-10-CM | POA: Diagnosis present

## 2016-12-10 DIAGNOSIS — Z85828 Personal history of other malignant neoplasm of skin: Secondary | ICD-10-CM | POA: Diagnosis not present

## 2016-12-10 MED ORDER — FLECAINIDE ACETATE 150 MG PO TABS
ORAL_TABLET | ORAL | 1 refills | Status: DC
Start: 2016-12-10 — End: 2018-03-17

## 2016-12-10 NOTE — Progress Notes (Signed)
Patient ID: Ethan Lambert, male   DOB: 1961/03/14, 56 y.o.   MRN: [id number]     Primary Care Physician: Patient, No Pcp Per Referring Physician: Dr. Philip Aspen is a 56 y.o. male with a h/o PAF for evaluation in the afib clinic for recent afib visit to the ER.[name] He reports just a rare episode  of afib that is converted quickly, within one-two hours, with flecainide 150 mg x 2 tablets. He woke up with afib the other day, took flecainide and went back to sleep. He woke up in SR but then it came back in a few minutes. He went to the ER. He had converted on the way but he decided to be checked anyway.  He feels that his afib burden is still manageable with prn flecainide and he does not thinks he needs daily flecainide. He continues on eliquis 5 mg bid without bleeding issues. We discussed risk factors for afib issues, he does weigh more than before, he thinks because of inactivity.   No alcohol use, mod caffeine, usually not a trigger, negative sleep study in past.  Today, he denies symptoms of palpitations, chest pain, shortness of breath, orthopnea, PND, lower extremity edema, dizziness, presyncope, syncope, or neurologic sequela. The patient is tolerating medications without difficulties and is otherwise without complaint today.   Past Medical History:  Diagnosis Date  . Anemia   . Blood transfusion without reported diagnosis   . Cancer ([place])    skin  . Dysrhythmia   . Family history of malignant neoplasm of gastrointestinal tract   . 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 History of kidney stones   . History of nonmelanoma skin cancer   . HIV disease ([place]) dx 1999   MONITORED BY INFECTIOUS DISEASE -- DR [place]  . Hypertension   . Paroxysmal atrial fibrillation (HCC)    CARDIOLOGIST--  DR Rayann Heman  . Right knee meniscal tear    Past Surgical History:  Procedure Laterality Date  . APPENDECTOMY   06-09-2003  . HERNIA REPAIR    . INGUINAL HERNIA REPAIR Bilateral 1990's  . KNEE ARTHROSCOPY WITH MEDIAL MENISECTOMY Right 12/31/2013   Procedure: RIGHT KNEE ARTHROSCOPY PARTIAL MEDIAL MENISCECTOMY DEBRIDEMENT AND CHONDROPLASTY ;  Surgeon: Sydnee Cabal, MD;  Location: [facility];  Service: Orthopedics;  Laterality: Right;  . LAPAROSCOPIC CHOLECYSTECTOMY  07-08-2003  . LASER ABLATION ANAL CONDYLOMA  05-18-2001  . LUMBAR LAMINECTOMY/DECOMPRESSION MICRODISCECTOMY Left 03/15/2015   Procedure: Left Lumbar five- sacral one microdiskectomy;  Surgeon: Consuella Lose, MD;  Location: [facility];  Service: Neurosurgery;  Laterality: Left;  Left L5S1 microdiskectomy  . NEGATIVE SLEEP STUDY  01-03-2012  in epic  . TRANSTHORACIC ECHOCARDIOGRAM  12-01-2008   MODERATE LVH/  EF 55-60%/  MILD MR/  NEGATIVE BUBBLE STUDY    Current Outpatient Prescriptions  Medication Sig Dispense Refill  . diltiazem (TIAZAC) 360 MG 24 hr capsule TAKE 1 CAPSULE BY MOUTH  DAILY 90 capsule 0  . ELIQUIS 5 MG TABS tablet TAKE 1 TABLET BY MOUTH  TWICE A DAY 180 tablet 0  . flecainide (TAMBOCOR) 150 MG tablet Take 2 tablets (300mg ) for breakthrough afib. 4 days between doses. 10 tablet 1  . TRIUMEQ 600-50-300 MG tablet TAKE 1 TABLET BY MOUTH EVERY DAY 30 tablet 0   No current facility-administered medications for this encounter.     Allergies  Allergen Reactions  .  Morphine Other (See Comments)    hallucinations    Social History   Social History  . Marital status: Married    Spouse name: N/A  . Number of children: 1  . Years of education: N/A   Occupational History  . [employer] CLERK Database administrator   Social History Main Topics  . Smoking status: Never Smoker  . Smokeless tobacco: Never Used  . Alcohol use 0.0 oz/week     Comment: rarely; 1-2 per month  . Drug use: No  . Sexual activity: Not on file   Other Topics Concern  . Not on file   Social History Narrative   Pt lives in  [place].  Unemployed.    Family History  Problem Relation Age of Onset  . Arrhythmia Father   . Heart attack Brother   . Prostate cancer Maternal Grandfather   . Colon cancer Maternal Grandfather   . Lung cancer Mother        smoker  . Hypertension Mother   . Hyperlipidemia Mother   . Other Maternal Grandmother        harding of the arteries    ROS- All systems are reviewed and negative except as per the HPI above  Physical Exam: Vitals:   12/10/16 1357  BP: 126/78  Pulse: 66  Weight: 258 lb 9.6 oz (117.3 kg)  Height: 6' 1.5" (1.867 m)    GEN- The patient is well appearing, alert and oriented x 3 today.   Head- normocephalic, atraumatic Eyes-  Sclera clear, conjunctiva pink Ears- hearing intact Oropharynx- clear Neck- supple, no JVP Lymph- no cervical lymphadenopathy Lungs- Clear to ausculation bilaterally, normal work of breathing Heart- Regular rate and rhythm, no murmurs, rubs or gallops, PMI not laterally displaced GI- soft, NT, ND, + BS Extremities- no clubbing, cyanosis, or edema MS- no significant deformity or atrophy Skin- no rash or lesion Psych- euthymic mood, full affect Neuro- strength and sensation are intact  EKG- NSR, IRBBB, LAD,pr int 198 ms, qrs int 114 ms, qtc 415 ms Epic records reviewed  Assessment and Plan: 1. PAF Doing well with prn flecainide. Aware that he cannot take the 300 mg dose more than  every 4 days Continue eliquis, no bleeding issues. Continue diltiazem  2. Inactivity/weight gain Increase activity with weight loss encouraged, to diminish afib burden  F/u With Dr. Rayann Heman  in 3-4 months  Geroge Lambert. Ethan Lambert, [facility] [address] [place], [place] [address] [phone]

## 2016-12-30 ENCOUNTER — Other Ambulatory Visit: Payer: Self-pay | Admitting: Internal Medicine

## 2017-01-15 ENCOUNTER — Other Ambulatory Visit: Payer: Self-pay | Admitting: Internal Medicine

## 2017-01-27 ENCOUNTER — Other Ambulatory Visit: Payer: Self-pay | Admitting: *Deleted

## 2017-01-27 MED ORDER — DILTIAZEM HCL ER BEADS 360 MG PO CP24: 360.0000 mg | ORAL_CAPSULE | Freq: Every day | ORAL | 0 refills | Status: DC

## 2017-01-29 ENCOUNTER — Other Ambulatory Visit: Payer: 59

## 2017-01-29 ENCOUNTER — Other Ambulatory Visit (HOSPITAL_COMMUNITY)
Admission: RE | Admit: 2017-01-29 | Discharge: 2017-01-29 | Disposition: A | Discharge status: Home / Self Care | Payer: 59 | Source: Ambulatory Visit | Attending: Internal Medicine | Admitting: Internal Medicine

## 2017-01-29 DIAGNOSIS — B20 Human immunodeficiency virus [HIV] disease: Secondary | ICD-10-CM | POA: Diagnosis present

## 2017-01-29 DIAGNOSIS — Z113 Encounter for screening for infections with a predominantly sexual mode of transmission: Secondary | ICD-10-CM | POA: Diagnosis not present

## 2017-01-29 DIAGNOSIS — Z79899 Other long term (current) drug therapy: Secondary | ICD-10-CM

## 2017-01-29 LAB — CBC WITH DIFFERENTIAL/PLATELET
Basophils Absolute: 74 cells/uL (ref 0–200)
Basophils Relative: 1 %
Eosinophils Absolute: 148 cells/uL (ref 15–500)
Eosinophils Relative: 2 %
HCT: 43.3 % (ref 38.5–50.0)
Hemoglobin: 14.7 g/dL (ref 13.2–17.1)
Lymphocytes Relative: 26 %
Lymphs Abs: 1924 cells/uL (ref 850–3900)
MCH: 31.7 pg (ref 27.0–33.0)
MCHC: 33.9 g/dL (ref 32.0–36.0)
MCV: 93.5 fL (ref 80.0–100.0)
MPV: 9.4 fL (ref 7.5–12.5)
Monocytes Absolute: 666 cells/uL (ref 200–950)
Monocytes Relative: 9 %
Neutro Abs: 4588 cells/uL (ref 1500–7800)
Neutrophils Relative %: 62 %
Platelets: 261 10*3/uL (ref 140–400)
RBC: 4.63 MIL/uL (ref 4.20–5.80)
RDW: 14.3 % (ref 11.0–15.0)
WBC: 7.4 10*3/uL (ref 3.8–10.8)

## 2017-01-30 LAB — T-HELPER CELL (CD4) - (RCID CLINIC ONLY)
CD4 % Helper T Cell: 37 % (ref 33–55)
CD4 T Cell Abs: 760 /uL (ref 400–2700)

## 2017-01-30 LAB — COMPLETE METABOLIC PANEL WITH GFR
ALT: 22 U/L (ref 9–46)
AST: 24 U/L (ref 10–35)
Albumin: 4.4 g/dL (ref 3.6–5.1)
Alkaline Phosphatase: 85 U/L (ref 40–115)
BUN: 19 mg/dL (ref 7–25)
CO2: 19 mmol/L — ABNORMAL LOW (ref 20–32)
Calcium: 9.2 mg/dL (ref 8.6–10.3)
Chloride: 106 mmol/L (ref 98–110)
Creat: 1.11 mg/dL (ref 0.70–1.33)
GFR, Est African American: 85 mL/min (ref >=60)
GFR, Est Non African American: 74 mL/min (ref >=60)
Glucose, Bld: 103 mg/dL — ABNORMAL HIGH (ref 65–99)
Potassium: 4.1 mmol/L (ref 3.5–5.3)
Sodium: 139 mmol/L (ref 135–146)
Total Bilirubin: 0.7 mg/dL (ref 0.2–1.2)
Total Protein: 7.4 g/dL (ref 6.1–8.1)

## 2017-01-30 LAB — URINE CYTOLOGY ANCILLARY ONLY
Chlamydia: NEGATIVE
Neisseria Gonorrhea: NEGATIVE

## 2017-01-30 LAB — RPR

## 2017-02-01 LAB — HIV-1 RNA QUANT-NO REFLEX-BLD
HIV 1 RNA Quant: <20 copies/mL
HIV-1 RNA Quant, Log: <1.3 Log copies/mL

## 2017-02-03 ENCOUNTER — Ambulatory Visit (INDEPENDENT_AMBULATORY_CARE_PROVIDER_SITE_OTHER): Payer: 59

## 2017-02-03 ENCOUNTER — Encounter: Payer: Self-pay | Admitting: Urgent Care

## 2017-02-03 ENCOUNTER — Ambulatory Visit (INDEPENDENT_AMBULATORY_CARE_PROVIDER_SITE_OTHER): Payer: 59 | Admitting: Urgent Care

## 2017-02-03 VITALS — BP 142/76 | HR 72 | Temp 98.2°F | Resp 18 | Ht 73.5 in | Wt 258.4 lb

## 2017-02-03 DIAGNOSIS — M5030 Other cervical disc degeneration, unspecified cervical region: Secondary | ICD-10-CM

## 2017-02-03 DIAGNOSIS — Z7901 Long term (current) use of anticoagulants: Secondary | ICD-10-CM | POA: Diagnosis not present

## 2017-02-03 DIAGNOSIS — M25511 Pain in right shoulder: Secondary | ICD-10-CM

## 2017-02-03 DIAGNOSIS — M542 Cervicalgia: Secondary | ICD-10-CM | POA: Diagnosis not present

## 2017-02-03 DIAGNOSIS — B20 Human immunodeficiency virus [HIV] disease: Secondary | ICD-10-CM | POA: Diagnosis not present

## 2017-02-03 MED ORDER — PREDNISONE 20 MG PO TABS: ORAL_TABLET | ORAL | 0 refills | Status: DC

## 2017-02-03 NOTE — Patient Instructions (Addendum)
Shoulder Pain Many things can cause shoulder pain, including:  An injury to the area.  Overuse of the shoulder.  Arthritis.  The source of the pain can be:  Inflammation.  An injury to the shoulder joint.  An injury to a tendon, ligament, or bone.  Follow these instructions at home: Take these actions to help with your pain:  Squeeze a soft ball or a foam pad as much as possible. This helps to keep the shoulder from swelling. It also helps to strengthen the arm.  Take over-the-counter and prescription medicines only as told by your health care provider.  If directed, apply ice to the area: ? Put ice in a plastic bag. ? Place a towel between your skin and the bag. ? Leave the ice on for 20 minutes, 2-3 times per day. Stop applying ice if it does not help with the pain.  If you were given a shoulder sling or immobilizer: ? Wear it as told. ? Remove it to shower or bathe. ? Move your arm as little as possible, but keep your hand moving to prevent swelling.  Contact a health care provider if:  Your pain gets worse.  Your pain is not relieved with medicines.  New pain develops in your arm, hand, or fingers. Get help right away if:  Your arm, hand, or fingers: ? Tingle. ? Become numb. ? Become swollen. ? Become painful. ? Turn white or blue. This information is not intended to replace advice given to you by your health care provider. Make sure you discuss any questions you have with your health care provider. Document Released: 03/20/2005 Document Revised: 02/04/2016 Document Reviewed: 10/03/2014 Elsevier Interactive Patient Education  2017 [employer].     IF you received an x-ray today, you will receive an invoice from [facility]. Please contact [facility] at [phone] with questions or concerns regarding your invoice.   IF you received labwork today, you will receive an invoice from [facility]. Please contact [facility] at [phone] with  questions or concerns regarding your invoice.   Our billing staff will not be able to assist you with questions regarding bills from these companies.  You will be contacted with the lab results as soon as they are available. The fastest way to get your results is to activate your My Chart account. Instructions are located on the last page of this paperwork. If you have not heard from [place] regarding the results in 2 weeks, please contact this office.

## 2017-02-03 NOTE — Progress Notes (Signed)
MRN: [id number] DOB: Sep 03, 1960  Subjective:   Ethan Lambert is a 56 y.o. male presenting for chief complaint of Shoulder Pain (Rt shoulder since about April. Sharp pain.)  Reports 4 month history of persistent, sharp, daily right shoulder pain. Pain is severe when he lays on it, also worsened with over-head activity and can radiate toward his right elbow. He does also have intermittent neck and right trapezius pain which is milder in nature. Has tried ibuprofen with good results. 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 Denies fever, warmth, erythema, trauma, falls. 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  Ethan Lambert has a current medication list which includes the following prescription(s): diltiazem, eliquis, flecainide, and triumeq. Also is allergic to morphine.  Ethan Lambert  has a past medical history of Anemia; Blood transfusion without reported diagnosis; Cancer ([place]); Dysrhythmia; Family history of malignant neoplasm of gastrointestinal tract; History of CVA (cerebrovascular accident) (no residuals); History of kidney stones; History of nonmelanoma skin cancer; HIV disease ([place]) (dx 1999); Hypertension; Paroxysmal atrial fibrillation (HCC); and Right knee meniscal tear. Also  has a past surgical history that includes Appendectomy (06-09-2003); Laparoscopic cholecystectomy (07-08-2003); Inguinal hernia repair (Bilateral, 1990's); LASER ABLATION ANAL CONDYLOMA (05-18-2001); transthoracic echocardiogram (12-01-2008); NEGATIVE SLEEP STUDY (01-03-2012  in epic); Knee arthroscopy with medial menisectomy (Right, 12/31/2013); Hernia repair; and Lumbar laminectomy/decompression microdiscectomy (Left, 03/15/2015).  Objective:   Vitals: BP (!) 142/76   Pulse 72   Temp 98.2 F (36.8 C) (Oral)   Resp 18   Ht 6' 1.5" (1.867 m)   Wt 258 lb 6.4 oz (117.2 kg)   SpO2 95%    BMI 33.63 kg/m   Physical Exam  Constitutional: He is oriented to person, place, and time. He appears well-developed and well-nourished.  Cardiovascular: Normal rate.   Pulmonary/Chest: Effort normal.  Musculoskeletal:       Right shoulder: He exhibits decreased range of motion (external rotation) and tenderness (deltoid and AC joint). He exhibits no swelling, no effusion, no crepitus, no deformity, no laceration and normal strength.       Cervical back: He exhibits tenderness (over right trapezius). He exhibits normal range of motion, no bony tenderness, no swelling, no edema, no deformity and no spasm.  Negative Spurling, Hawkin, Neer and Empty-Beer-Can tests.  Neurological: He is alert and oriented to person, place, and time.   Dg Cervical Spine Complete  Result Date: 02/03/2017 CLINICAL DATA:  Neck, shoulder and right arm pain EXAM: CERVICAL SPINE - COMPLETE 4+ VIEW COMPARISON:  None. FINDINGS: Early degenerative changes in the facet joints bilaterally. Slight bilateral neural foraminal narrowing at C3-4. No fracture or malalignment. Normal prevertebral soft tissues. IMPRESSION: Early spondylosis.  No acute findings. Electronically Signed   By: Rolm Baptise M.D.   On: 02/03/2017 15:33   Dg Shoulder Right  Result Date: 02/03/2017 CLINICAL DATA:  Persistent right shoulder pain EXAM: RIGHT SHOULDER - 2+ VIEW COMPARISON:  None. FINDINGS: There is no evidence of fracture or dislocation. There is no evidence of arthropathy or other focal bone abnormality. Soft tissues are unremarkable. IMPRESSION: No acute osseous injury of the right shoulder. Electronically Signed   By: Kathreen Devoid   On: 02/03/2017 15:33    Assessment and Plan :   1. Acute pain of right shoulder 2. Neck pain 3. Chronic anticoagulation 4. HIV disease (Ethan Lambert) 5. Degenerative disc disease, cervical -  Patient has been advised not to use NSAIDs due to history of stroke and current use of Eliquis. Due to severity of his pain,  chronic nature, I offered patient a short steroid course and work restrictions. If he does not improve, we may need to pursue MRI for soft tissue injury although physical exam findings do not demonstrate signs of rotator cuff injury. Will consider referral to PT, ortho. Recheck in 1 week.  Jaynee Eagles, PA-C [facility] [phone] 02/03/2017  2:51 PM

## 2017-02-10 ENCOUNTER — Ambulatory Visit: Payer: 59 | Admitting: Urgent Care

## 2017-02-11 ENCOUNTER — Ambulatory Visit: Payer: Self-pay | Admitting: Internal Medicine

## 2017-02-12 ENCOUNTER — Other Ambulatory Visit: Payer: Self-pay | Admitting: *Deleted

## 2017-02-12 ENCOUNTER — Ambulatory Visit (INDEPENDENT_AMBULATORY_CARE_PROVIDER_SITE_OTHER): Payer: 59 | Admitting: Urgent Care

## 2017-02-12 ENCOUNTER — Encounter: Payer: Self-pay | Admitting: Internal Medicine

## 2017-02-12 ENCOUNTER — Encounter: Payer: Self-pay | Admitting: Urgent Care

## 2017-02-12 ENCOUNTER — Ambulatory Visit (INDEPENDENT_AMBULATORY_CARE_PROVIDER_SITE_OTHER): Payer: 59 | Admitting: Internal Medicine

## 2017-02-12 VITALS — BP 117/74 | HR 60 | Temp 98.9°F | Resp 16 | Ht 73.5 in | Wt 255.0 lb

## 2017-02-12 VITALS — BP 135/79 | HR 70 | Temp 97.6°F | Wt 256.0 lb

## 2017-02-12 DIAGNOSIS — M47812 Spondylosis without myelopathy or radiculopathy, cervical region: Secondary | ICD-10-CM

## 2017-02-12 DIAGNOSIS — M25511 Pain in right shoulder: Secondary | ICD-10-CM

## 2017-02-12 DIAGNOSIS — G8929 Other chronic pain: Secondary | ICD-10-CM | POA: Diagnosis not present

## 2017-02-12 DIAGNOSIS — Z5181 Encounter for therapeutic drug level monitoring: Secondary | ICD-10-CM | POA: Diagnosis not present

## 2017-02-12 DIAGNOSIS — B20 Human immunodeficiency virus [HIV] disease: Secondary | ICD-10-CM

## 2017-02-12 DIAGNOSIS — Z79899 Other long term (current) drug therapy: Secondary | ICD-10-CM | POA: Diagnosis not present

## 2017-02-12 DIAGNOSIS — Z113 Encounter for screening for infections with a predominantly sexual mode of transmission: Secondary | ICD-10-CM | POA: Diagnosis not present

## 2017-02-12 MED ORDER — ABACAVIR-DOLUTEGRAVIR-LAMIVUD 600-50-300 MG PO TABS
1.0000 | ORAL_TABLET | Freq: Every day | ORAL | 6 refills | Status: DC
Start: 2017-02-12 — End: 2017-10-29

## 2017-02-12 MED ORDER — CYCLOBENZAPRINE HCL 5 MG PO TABS: 5.0000 mg | ORAL_TABLET | Freq: Three times a day (TID) | ORAL | 1 refills | Status: DC | PRN

## 2017-02-12 NOTE — Progress Notes (Addendum)
  MRN: [id number] DOB: Apr 27, 1961  Subjective:   Ethan Lambert is a 56 y.o. male presenting for Follow-up  Last OV was 03/06/2017, was started on conservative management for right shoulder pain. He has completed a short steroid course and notes some improvement. However, he still has consistent sharp intermittent pains over his right shoulder that radiate down his deltoid and into his triceps at times. He has a lot of difficulty raising his arm higher than 90 degrees. Also has some intermittent clicking and popping of his shoulder. Denies fever, neck pain, swelling, redness, trauma.   Jameek has a current medication list which includes the following prescription(s): diltiazem, eliquis, flecainide, triumeq, and prednisone. Also is allergic to morphine.  Afshin  has a past medical history of Anemia; Blood transfusion without reported diagnosis; Cancer ([place]); Dysrhythmia; Family history of malignant neoplasm of gastrointestinal tract; History of CVA (cerebrovascular accident) (no residuals); History of kidney stones; History of nonmelanoma skin cancer; HIV disease ([place]) (dx 1999); Hypertension; Paroxysmal atrial fibrillation (HCC); and Right knee meniscal tear. Also  has a past surgical history that includes Appendectomy (06-09-2003); Laparoscopic cholecystectomy (07-08-2003); Inguinal hernia repair (Bilateral, 1990's); LASER ABLATION ANAL CONDYLOMA (05-18-2001); transthoracic echocardiogram (12-01-2008); NEGATIVE SLEEP STUDY (01-03-2012  in epic); Knee arthroscopy with medial menisectomy (Right, 12/31/2013); Hernia repair; and Lumbar laminectomy/decompression microdiscectomy (Left, 03/15/2015).  Objective:   Vitals: BP 117/74 (BP Location: Right Arm, Patient Position: Sitting, Cuff Size: Large)   Pulse 60   Temp 98.9 F (37.2 C) (Oral)   Resp 16   Ht 6' 1.5" (1.867 m)   Wt 255 lb (115.7 kg)   SpO2 95%   BMI 33.19 kg/m   Physical Exam  Constitutional: He is oriented to person, place, and time.  He appears well-developed and well-nourished.  Cardiovascular: Normal rate.   Pulmonary/Chest: Effort normal.  Musculoskeletal:       Right shoulder: He exhibits decreased range of motion (external rotation significantly limited), spasm (over right trapezius) and decreased strength (secondary to pain). He exhibits no tenderness (negative Hawkins, Neer tests), no bony tenderness, no swelling, no effusion, no crepitus and no deformity.  Neurological: He is alert and oriented to person, place, and time.   Assessment and Plan :   1. Chronic right shoulder pain 2. Osteoarthritis of cervical spine, unspecified spinal osteoarthritis complication status - Unclear etiology. I offered patient MRI order and would like him to start PT. However, patient prefers to see specialist given the nature of his work. He would like to try and resolve this issue as soon as possible. I explained that ortho may need to pursue an MRI as well. Patient verbalized understanding. He will discuss the use of NSAIDs with his physician today given that he is on Eliquis. Work restrictions maintained. Follow up in 2 weeks if he has not yet seen ortho.  Jaynee Eagles, PA-C [facility] [phone] 02/12/2017  8:30 AM

## 2017-02-12 NOTE — Progress Notes (Signed)
   Subjective:    Patient ID: Ethan Lambert, male    DOB: 1960/06/29, 56 y.o.   MRN: [id number]  HPI Here for follow up of HIV Has been on Triumeq and denies any missed doses.  No associated n/v/d.  No rash.  CD4 of 760 and viral load < 20.  Recently has had a painful shoulder.  No other new issues.  Still in custody of now 75 month old girl.  Still with same partner who is on PrEP.    Review of Systems  Constitutional: Negative for fatigue.  Gastrointestinal: Negative for diarrhea and nausea.  Skin: Negative for rash.  Neurological: Negative for dizziness.       Objective:   Physical Exam  Constitutional: He appears well-developed and well-nourished. No distress.  HENT:  Mouth/Throat: No oropharyngeal exudate.  Eyes: No scleral icterus.  Cardiovascular: Normal rate, regular rhythm and normal heart sounds.   No murmur heard. Pulmonary/Chest: Effort normal and breath sounds normal. No respiratory distress.  Lymphadenopathy:    He has no cervical adenopathy.  Skin: No rash noted.   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

## 2017-02-12 NOTE — Assessment & Plan Note (Signed)
Doing great.  He can rtc in 1 year.

## 2017-02-12 NOTE — Assessment & Plan Note (Signed)
Screened negative 

## 2017-02-12 NOTE — Assessment & Plan Note (Signed)
Creat, LFTs wnl.  

## 2017-02-12 NOTE — Patient Instructions (Addendum)
If you experience drowsiness during the day with Flexeril then take 5-10mg  at night only.    Shoulder Pain Many things can cause shoulder pain, including:  An injury to the area.  Overuse of the shoulder.  Arthritis.  The source of the pain can be:  Inflammation.  An injury to the shoulder joint.  An injury to a tendon, ligament, or bone.  Follow these instructions at home: Take these actions to help with your pain:  Squeeze a soft ball or a foam pad as much as possible. This helps to keep the shoulder from swelling. It also helps to strengthen the arm.  Take over-the-counter and prescription medicines only as told by your health care provider.  If directed, apply ice to the area: ? Put ice in a plastic bag. ? Place a towel between your skin and the bag. ? Leave the ice on for 20 minutes, 2-3 times per day. Stop applying ice if it does not help with the pain.  If you were given a shoulder sling or immobilizer: ? Wear it as told. ? Remove it to shower or bathe. ? Move your arm as little as possible, but keep your hand moving to prevent swelling.  Contact a health care provider if:  Your pain gets worse.  Your pain is not relieved with medicines.  New pain develops in your arm, hand, or fingers. Get help right away if:  Your arm, hand, or fingers: ? Tingle. ? Become numb. ? Become swollen. ? Become painful. ? Turn white or blue. This information is not intended to replace advice given to you by your health care provider. Make sure you discuss any questions you have with your health care provider. Document Released: 03/20/2005 Document Revised: 02/04/2016 Document Reviewed: 10/03/2014 Elsevier Interactive Patient Education  2017 [employer].     IF you received an x-ray today, you will receive an invoice from [facility]. Please contact [facility] at [phone] with questions or concerns regarding your invoice.   IF you received  labwork today, you will receive an invoice from [facility]. Please contact [facility] at [phone] with questions or concerns regarding your invoice.   Our billing staff will not be able to assist you with questions regarding bills from these companies.  You will be contacted with the lab results as soon as they are available. The fastest way to get your results is to activate your My Chart account. Instructions are located on the last page of this paperwork. If you have not heard from [place] regarding the results in 2 weeks, please contact this office.

## 2017-03-26 ENCOUNTER — Ambulatory Visit (INDEPENDENT_AMBULATORY_CARE_PROVIDER_SITE_OTHER): Payer: 59 | Admitting: Internal Medicine

## 2017-03-26 ENCOUNTER — Encounter: Payer: Self-pay | Admitting: Internal Medicine

## 2017-03-26 VITALS — BP 131/88 | HR 52 | Ht 73.5 in | Wt 260.2 lb

## 2017-03-26 DIAGNOSIS — G4733 Obstructive sleep apnea (adult) (pediatric): Secondary | ICD-10-CM

## 2017-03-26 DIAGNOSIS — I10 Essential (primary) hypertension: Secondary | ICD-10-CM

## 2017-03-26 DIAGNOSIS — I480 Paroxysmal atrial fibrillation: Secondary | ICD-10-CM | POA: Diagnosis not present

## 2017-03-26 MED ORDER — DILTIAZEM HCL ER COATED BEADS 240 MG PO CP24: 240.0000 mg | ORAL_CAPSULE | Freq: Every day | ORAL | 3 refills | Status: DC

## 2017-03-26 NOTE — Progress Notes (Signed)
PCP: Patient, No Pcp Per   Primary EP: Dr Rayann Heman  Ethan Lambert is a 56 y.o. male who presents today for routine electrophysiology followup.  Since last being seen in our clinic, the patient reports doing very well.  Today, he denies symptoms of palpitations, chest pain, shortness of breath,  lower extremity edema, dizziness, presyncope, or syncope.  The patient is otherwise without complaint today.   Past Medical History:  Diagnosis Date  . Anemia   . Blood transfusion without reported diagnosis   . Cancer ([place])    skin  . Dysrhythmia   . Family history of malignant neoplasm of gastrointestinal tract   . 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 History of kidney stones   . History of nonmelanoma skin cancer   . HIV disease ([place]) dx 1999   MONITORED BY INFECTIOUS DISEASE -- DR [place]  . Hypertension   . Paroxysmal atrial fibrillation (HCC)    CARDIOLOGIST--  DR Rayann Heman  . Right knee meniscal tear    Past Surgical History:  Procedure Laterality Date  . APPENDECTOMY  06-09-2003  . HERNIA REPAIR    . INGUINAL HERNIA REPAIR Bilateral 1990's  . KNEE ARTHROSCOPY WITH MEDIAL MENISECTOMY Right 12/31/2013   Procedure: RIGHT KNEE ARTHROSCOPY PARTIAL MEDIAL MENISCECTOMY DEBRIDEMENT AND CHONDROPLASTY ;  Surgeon: Sydnee Cabal, MD;  Location: [facility];  Service: Orthopedics;  Laterality: Right;  . LAPAROSCOPIC CHOLECYSTECTOMY  07-08-2003  . LASER ABLATION ANAL CONDYLOMA  05-18-2001  . LUMBAR LAMINECTOMY/DECOMPRESSION MICRODISCECTOMY Left 03/15/2015   Procedure: Left Lumbar five- sacral one microdiskectomy;  Surgeon: Consuella Lose, MD;  Location: [facility];  Service: Neurosurgery;  Laterality: Left;  Left L5S1 microdiskectomy  . NEGATIVE SLEEP STUDY  01-03-2012  in epic  . TRANSTHORACIC ECHOCARDIOGRAM  12-01-2008   MODERATE LVH/  EF 55-60%/  MILD MR/  NEGATIVE BUBBLE STUDY    ROS- all  systems are reviewed and negatives except as per HPI above  Current Outpatient Prescriptions  Medication Sig Dispense Refill  . abacavir-dolutegravir-lamiVUDine (TRIUMEQ) 600-50-300 MG tablet Take 1 tablet by mouth daily. 30 tablet 6  . cyclobenzaprine (FLEXERIL) 5 MG tablet Take 1 tablet (5 mg total) by mouth 3 (three) times daily as needed. 90 tablet 1  . diltiazem (TIAZAC) 360 MG 24 hr capsule Take 1 capsule (360 mg total) by mouth daily. 90 capsule 0  . ELIQUIS 5 MG TABS tablet TAKE 1 TABLET BY MOUTH  TWICE A DAY 180 tablet 0  . flecainide (TAMBOCOR) 150 MG tablet Take 2 tablets (300mg ) for breakthrough afib. 4 days between doses. 10 tablet 1   No current facility-administered medications for this visit.     Physical Exam: Vitals:   03/26/17 0925  BP: 131/88  Pulse: (!) 52  SpO2: 98%  Weight: 260 lb 3.2 oz (118 kg)  Height: 6' 1.5" (1.867 m)    GEN- The patient is well appearing, alert and oriented x 3 today.   Head- normocephalic, atraumatic Eyes-  Sclera clear, conjunctiva pink Ears- hearing intact Oropharynx- clear Lungs- Clear to ausculation bilaterally, normal work of breathing Heart- Regular rate and rhythm, no murmurs, rubs or gallops, PMI not laterally displaced GI- soft, NT, ND, + BS Extremities- no clubbing, cyanosis, or edema  EKG tracing ordered today is personally reviewed and shows sinus bradycardia 52 bpm, PR 212 msec,   Echo 03/13/17 reveals normal EF, no significatn valvualr disease, mild atrial  enlargement  Assessment and Plan:  1. Paroxysmal atrial fibrillation Doing reasonably well Further medical optimization is limited by bradycardia.  Reduce diltiazem CD to 240mg  daily today Therapeutic strategies for afib including medicine and ablation were discussed in detail with the patient today. Risk, benefits, and alternatives to EP study and radiofrequency ablation for afib were also discussed in detail today. These risks include but are not limited to  stroke, bleeding, vascular damage, tamponade, perforation, damage to the esophagus, lungs, and other structures, pulmonary vein stenosis, worsening renal function, and death. The patient understands these risk and wishes to consider this further.  He will contact our office if he decides to proceed.  Would anticipate cardiac CT prior to ablation to exclude LAA thrombus.   2. HTN Stable No change required today  3. OSA He has seen Dr Ron Parker previously Unable to afford mouth prosthesis   Thompson Grayer MD, [facility] 03/26/2017 10:19 AM

## 2017-03-26 NOTE — Addendum Note (Signed)
Addended by: Frederik Schmidt on: 03/26/2017 10:48 AM   Modules accepted: Orders

## 2017-03-26 NOTE — Patient Instructions (Addendum)
Medication Instructions:  Your physician has recommended you make the following change in your medication:  1) Decrease Diltazem to 240 mg daily.   Labwork: None   Testing/Procedures: None   Follow-Up: Your physician wants you to follow-up in: 12 months with Dr. Rayann Heman. You will receive a reminder letter in the mail two months in advance. If you don't receive a letter, please call our office to schedule the follow-up appointment.   Any Other Special Instructions Will Be Listed Below (If Applicable).     If you need a refill on your cardiac medications before your next appointment, please call your pharmacy.

## 2017-04-10 ENCOUNTER — Ambulatory Visit (INDEPENDENT_AMBULATORY_CARE_PROVIDER_SITE_OTHER): Payer: 59 | Admitting: Orthopaedic Surgery

## 2017-04-10 ENCOUNTER — Encounter (INDEPENDENT_AMBULATORY_CARE_PROVIDER_SITE_OTHER): Payer: Self-pay | Admitting: Orthopaedic Surgery

## 2017-04-10 DIAGNOSIS — M25511 Pain in right shoulder: Secondary | ICD-10-CM | POA: Diagnosis not present

## 2017-04-10 DIAGNOSIS — G8929 Other chronic pain: Secondary | ICD-10-CM

## 2017-04-10 NOTE — Progress Notes (Signed)
Office Visit Note   Patient: Ethan Lambert           Date of Birth: 1960-10-01           MRN: [id number] Visit Date: 04/10/2017              Requested by: Jaynee Eagles, PA-C [place], [place] [address] PCP: Patient, No Pcp Per   Assessment & Plan: Visit Diagnoses:  1. Chronic right shoulder pain     Plan: Overall impression is rotator cuff tendinosis. I recommend 6 weeks of physical therapy and subacromial injection which we performed today. Follow-up in 6 weeks for recheck. If not significantly better and may need to order MRI to evaluate for rotator cuff tear  Follow-Up Instructions: Return in about 6 weeks (around 05/22/2017).   Orders:  No orders of the defined types were placed in this encounter.  No orders of the defined types were placed in this encounter.     Procedures: Large Joint Inj Date/Time: 04/10/2017 8:44 AM Performed by: Leandrew Koyanagi Authorized by: Leandrew Koyanagi   Consent Given by:  Patient Timeout: prior to procedure the correct patient, procedure, and site was verified   Indications:  Pain Location:  Shoulder Site:  R subacromial bursa Prep: patient was prepped and draped in usual sterile fashion   Needle Size:  22 G Approach:  Posterior Ultrasound Guidance: No   Fluoroscopic Guidance: No       Clinical Data: No additional findings.   Subjective: Chief Complaint  Patient presents with  . Right Shoulder - Pain    Patient of 56 year old gentleman who comes in with chronic right shoulder pain for several months. His PCP is referred him to [place] for further evaluation and treatment. He has discomfort with certain activities when he is reaching out to the side and picking up things.  It causes pain in the deltoid region. Denies any numbness or tingling or radicular symptoms. He is right-hand dominant. Denies any injuries. Pain does not radiate.    Review of Systems  Constitutional: Negative.   All other systems reviewed and are  negative.    Objective: Vital Signs: There were no vitals taken for this visit.  Physical Exam  Constitutional: He is oriented to person, place, and time. He appears well-developed and well-nourished.  HENT:  Head: Normocephalic and atraumatic.  Eyes: Pupils are equal, round, and reactive to light.  Neck: Neck supple.  Pulmonary/Chest: Effort normal.  Abdominal: Soft.  Musculoskeletal: Normal range of motion.  Neurological: He is alert and oriented to person, place, and time.  Skin: Skin is warm.  Psychiatric: He has a normal mood and affect. His behavior is normal. Judgment and thought content normal.  Nursing note and vitals reviewed.   Ortho Exam Right shoulder exam shows mildly positive Neer impingement. Negative Hawkins impingement. Empty can testing and infraspinatus testing are grossly intact with mild pain and very mild weakness. Mildly positive cross adduction sign. Negative crank test. Specialty Comments:  No specialty comments available.  Imaging: No results found.   PMFS History: Patient Active Problem List   Diagnosis Date Noted  . Medication monitoring encounter 02/12/2017  . Sprain of shoulder, right 10/01/2016  . Shoulder pain, right 09/26/2016  . Acromioclavicular joint arthritis 09/26/2016  . Screening examination for venereal disease 06/08/2015  . Encounter for long-term (current) use of medications 06/08/2015  . HNP (herniated nucleus pulposus), lumbar 03/15/2015  . Fatigue 01/26/2015  . S/P right knee arthroscopy 12/31/2013  .  INGUINAL HERNIORRHAPHIES, BILATERAL, HX OF 01/15/2010  . VARICOCELE 07/05/2009  . HEMATOSPERMIA 06/27/2009  . Essential hypertension 12/08/2008  . Atrial fibrillation ([place]) 12/08/2008  . Cerebral artery occlusion with cerebral infarction ([place]) 12/08/2008  . Human immunodeficiency virus disease ([place]) 11/18/2006   Past Medical History:  Diagnosis Date  . Anemia   . Blood transfusion without reported diagnosis   . Cancer  ([place])    skin  . Family history of malignant neoplasm of gastrointestinal tract   . 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 History of kidney stones   . History of nonmelanoma skin cancer   . HIV disease ([place]) dx 1999   MONITORED BY INFECTIOUS DISEASE -- DR [place]  . Hypertension   . Paroxysmal atrial fibrillation (HCC)    CARDIOLOGIST--  DR Rayann Heman  . Right knee meniscal tear     Family History  Problem Relation Age of Onset  . Arrhythmia Father   . Heart attack Brother   . Prostate cancer Maternal Grandfather   . Colon cancer Maternal Grandfather   . Lung cancer Mother        smoker  . Hypertension Mother   . Hyperlipidemia Mother   . Other Maternal Grandmother        harding of the arteries    Past Surgical History:  Procedure Laterality Date  . APPENDECTOMY  06-09-2003  . HERNIA REPAIR    . INGUINAL HERNIA REPAIR Bilateral 1990's  . KNEE ARTHROSCOPY WITH MEDIAL MENISECTOMY Right 12/31/2013   Procedure: RIGHT KNEE ARTHROSCOPY PARTIAL MEDIAL MENISCECTOMY DEBRIDEMENT AND CHONDROPLASTY ;  Surgeon: Sydnee Cabal, MD;  Location: [facility];  Service: Orthopedics;  Laterality: Right;  . LAPAROSCOPIC CHOLECYSTECTOMY  07-08-2003  . LASER ABLATION ANAL CONDYLOMA  05-18-2001  . LUMBAR LAMINECTOMY/DECOMPRESSION MICRODISCECTOMY Left 03/15/2015   Procedure: Left Lumbar five- sacral one microdiskectomy;  Surgeon: Consuella Lose, MD;  Location: [facility];  Service: Neurosurgery;  Laterality: Left;  Left L5S1 microdiskectomy  . NEGATIVE SLEEP STUDY  01-03-2012  in epic  . TRANSTHORACIC ECHOCARDIOGRAM  12-01-2008   MODERATE LVH/  EF 55-60%/  MILD MR/  NEGATIVE BUBBLE STUDY   Social History   Occupational History  . [employer] CLERK Database administrator   Social History Main Topics  . Smoking status: Never Smoker  . Smokeless tobacco: Never Used  . Alcohol use 0.0 oz/week     Comment:  rarely; 1-2 per month  . Drug use: No  . Sexual activity: Not on file

## 2017-10-29 ENCOUNTER — Other Ambulatory Visit: Payer: Self-pay | Admitting: Internal Medicine

## 2017-11-04 ENCOUNTER — Other Ambulatory Visit: Payer: Self-pay

## 2017-11-04 ENCOUNTER — Ambulatory Visit: Payer: 59 | Admitting: Physician Assistant

## 2017-11-04 ENCOUNTER — Encounter: Payer: Self-pay | Admitting: Physician Assistant

## 2017-11-04 VITALS — BP 130/70 | HR 82 | Temp 99.0°F | Resp 18 | Ht 73.5 in | Wt 263.4 lb

## 2017-11-04 DIAGNOSIS — W19XXXA Unspecified fall, initial encounter: Secondary | ICD-10-CM

## 2017-11-04 DIAGNOSIS — S39012A Strain of muscle, fascia and tendon of lower back, initial encounter: Secondary | ICD-10-CM | POA: Diagnosis not present

## 2017-11-04 DIAGNOSIS — M545 Low back pain, unspecified: Secondary | ICD-10-CM

## 2017-11-04 MED ORDER — TRAMADOL HCL 50 MG PO TABS: 50.0000 mg | ORAL_TABLET | Freq: Three times a day (TID) | ORAL | 0 refills | Status: DC | PRN

## 2017-11-04 MED ORDER — CYCLOBENZAPRINE HCL 5 MG PO TABS
5.0000 mg | ORAL_TABLET | Freq: Three times a day (TID) | ORAL | 0 refills | Status: DC | PRN
Start: 2017-11-04 — End: 2018-05-11

## 2017-11-04 NOTE — Patient Instructions (Addendum)
IF you received an x-ray today, you will receive an invoice from [facility]. Please contact [facility] at [phone] with questions or concerns regarding your invoice.   IF you received labwork today, you will receive an invoice from [facility]. Please contact [facility] at [phone] with questions or concerns regarding your invoice.   Our billing staff will not be able to assist you with questions regarding bills from these companies.  You will be contacted with the lab results as soon as they are available. The fastest way to get your results is to activate your My Chart account. Instructions are located on the last page of this paperwork. If you have not heard from [place] regarding the results in 2 weeks, please contact this office.     Low Back Sprain Rehab Ask your health care provider which exercises are safe for you. Do exercises exactly as told by your health care provider and adjust them as directed. It is normal to feel mild stretching, pulling, tightness, or discomfort as you do these exercises, but you should stop right away if you feel sudden pain or your pain gets worse. Do not begin these exercises until told by your health care provider. Stretching and range of motion exercises These exercises warm up your muscles and joints and improve the movement and flexibility of your back. These exercises also help to relieve pain, numbness, and tingling. Exercise A: Lumbar rotation  1. Lie on your back on a firm surface and bend your knees. 2. Straighten your arms out to your sides so each arm forms an "L" shape with a side of your body (a 90 degree angle). 3. Slowly move both of your knees to one side of your body until you feel a stretch in your lower back. Try not to let your shoulders move off of the floor. 4. Hold for __________ seconds. 5. Tense your abdominal muscles and slowly move your knees back to the starting position. 6. Repeat this exercise on the  other side of your body. Repeat __________ times. Complete this exercise __________ times a day. Exercise B: Prone extension on elbows  1. Lie on your abdomen on a firm surface. 2. Prop yourself up on your elbows. 3. Use your arms to help lift your chest up until you feel a gentle stretch in your abdomen and your lower back. ? This will place some of your body weight on your elbows. If this is uncomfortable, try stacking pillows under your chest. ? Your hips should stay down, against the surface that you are lying on. Keep your hip and back muscles relaxed. 4. Hold for __________ seconds. 5. Slowly relax your upper body and return to the starting position. Repeat __________ times. Complete this exercise __________ times a day. Strengthening exercises These exercises build strength and endurance in your back. Endurance is the ability to use your muscles for a long time, even after they get tired. Exercise C: Pelvic tilt 1. Lie on your back on a firm surface. Bend your knees and keep your feet flat. 2. Tense your abdominal muscles. Tip your pelvis up toward the ceiling and flatten your lower back into the floor. ? To help with this exercise, you may place a small towel under your lower back and try to push your back into the towel. 3. Hold for __________ seconds. 4. Let your muscles relax completely before you repeat this exercise. Repeat __________ times. Complete this exercise __________ times a day. Exercise D: Alternating arm  and leg raises  1. Get on your hands and knees on a firm surface. If you are on a hard floor, you may want to use padding to cushion your knees, such as an exercise mat. 2. Line up your arms and legs. Your hands should be below your shoulders, and your knees should be below your hips. 3. Lift your left leg behind you. At the same time, raise your right arm and straighten it in front of you. ? Do not lift your leg higher than your hip. ? Do not lift your arm higher  than your shoulder. ? Keep your abdominal and back muscles tight. ? Keep your hips facing the ground. ? Do not arch your back. ? Keep your balance carefully, and do not hold your breath. 4. Hold for __________ seconds. 5. Slowly return to the starting position and repeat with your right leg and your left arm. Repeat __________ times. Complete this exercise __________ times a day. Exercise E: Abdominal set with straight leg raise  1. Lie on your back on a firm surface. 2. Bend one of your knees and keep your other leg straight. 3. Tense your abdominal muscles and lift your straight leg up, 4-6 inches (10-15 cm) off the ground. 4. Keep your abdominal muscles tight and hold for __________ seconds. ? Do not hold your breath. ? Do not arch your back. Keep it flat against the ground. 5. Keep your abdominal muscles tense as you slowly lower your leg back to the starting position. 6. Repeat with your other leg. Repeat __________ times. Complete this exercise __________ times a day. Posture and body mechanics  Body mechanics refers to the movements and positions of your body while you do your daily activities. Posture is part of body mechanics. Good posture and healthy body mechanics can help to relieve stress in your body's tissues and joints. Good posture means that your spine is in its natural S-curve position (your spine is neutral), your shoulders are pulled back slightly, and your head is not tipped forward. The following are general guidelines for applying improved posture and body mechanics to your everyday activities. Standing   When standing, keep your spine neutral and your feet about hip-width apart. Keep a slight bend in your knees. Your ears, shoulders, and hips should line up.  When you do a task in which you stand in one place for a long time, place one foot up on a stable object that is 2-4 inches (5-10 cm) high, such as a footstool. This helps keep your spine  neutral. Sitting   When sitting, keep your spine neutral and keep your feet flat on the floor. Use a footrest, if necessary, and keep your thighs parallel to the floor. Avoid rounding your shoulders, and avoid tilting your head forward.  When working at a desk or a computer, keep your desk at a height where your hands are slightly lower than your elbows. Slide your chair under your desk so you are close enough to maintain good posture.  When working at a computer, place your monitor at a height where you are looking straight ahead and you do not have to tilt your head forward or downward to look at the screen. Resting   When lying down and resting, avoid positions that are most painful for you.  If you have pain with activities such as sitting, bending, stooping, or squatting (flexion-based activities), lie in a position in which your body does not bend very much. For example,  example, avoid curling up on your side with your arms and knees near your chest (fetal position).  If you have pain with activities such as standing for a long time or reaching with your arms (extension-based activities), lie with your spine in a neutral position and bend your knees slightly. Try the following positions:  Lying on your side with a pillow between your knees.  Lying on your back with a pillow under your knees. Lifting   When lifting objects, keep your feet at least shoulder-width apart and tighten your abdominal muscles.  Bend your knees and hips and keep your spine neutral. It is important to lift using the strength of your legs, not your back. Do not lock your knees straight out.  Always ask for help to lift heavy or awkward objects. This information is not intended to replace advice given to you by your health care provider. Make sure you discuss any questions you have with your health care provider. Document Released: 06/10/2005 Document Revised: 02/15/2016 Document Reviewed: 03/22/2015 Elsevier  Interactive Patient Education  2018 Elsevier Inc.  

## 2017-11-04 NOTE — Progress Notes (Signed)
Ethan Lambert  MRN: [id number] DOB: 08-31-1960  PCP: Patient, No Pcp Per  Chief Complaint  Patient presents with  . Back Pain    injuried back porch swing fell sunday middle and right side of back     Subjective:  Pt presents to clinic for back pain.  He was sitting in a porch swinging and it fell from the left side of its attached and he landed on bottom to the left side.  He did not have pain until later that night in his back. The pain is still there but it has gotten better.  The pain is in the middle and right side of his back and then radiates into right buttocks when moving - worst is going up and down steps.  Pain radiates into buttocks but not into leg or foot.  No paresthesias.  He has used Advil which has helped but he is not supposed to take it because he is on eliquis due to A fib.  History is obtained by patient.  Review of Systems  Genitourinary: Negative.   Musculoskeletal: Positive for back pain. Negative for gait problem.  Neurological: Negative for weakness.    Patient Active Problem List   Diagnosis Date Noted  . Medication monitoring encounter 02/12/2017  . Sprain of shoulder, right 10/01/2016  . Shoulder pain, right 09/26/2016  . Acromioclavicular joint arthritis 09/26/2016  . Screening examination for venereal disease 06/08/2015  . Encounter for long-term (current) use of medications 06/08/2015  . HNP (herniated nucleus pulposus), lumbar 03/15/2015  . Fatigue 01/26/2015  . S/P right knee arthroscopy 12/31/2013  . INGUINAL HERNIORRHAPHIES, BILATERAL, HX OF 01/15/2010  . VARICOCELE 07/05/2009  . HEMATOSPERMIA 06/27/2009  . Essential hypertension 12/08/2008  . Atrial fibrillation ([place]) 12/08/2008  . 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 Human immunodeficiency virus disease ([place]) 11/18/2006    Current Outpatient Medications on File Prior to Visit  Medication Sig Dispense Refill  . ELIQUIS 5 MG TABS tablet TAKE 1  TABLET BY MOUTH  TWICE A DAY 180 tablet 0  . flecainide (TAMBOCOR) 150 MG tablet Take 2 tablets (300mg ) for breakthrough afib. 4 days between doses. 10 tablet 1  . TRIUMEQ 600-50-300 MG tablet TAKE 1 TABLET BY MOUTH EVERY DAY 30 tablet 5  . diltiazem (CARDIZEM CD) 240 MG 24 hr capsule Take 1 capsule (240 mg total) by mouth daily. 90 capsule 3   No current facility-administered medications on file prior to visit.     Allergies  Allergen Reactions  . Morphine Other (See Comments)    hallucinations    Past Medical History:  Diagnosis Date  . Anemia   . Blood transfusion without reported diagnosis   . Cancer ([place])    skin  . Family history of malignant neoplasm of gastrointestinal tract   . 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 History of kidney stones   . History of nonmelanoma skin cancer   . HIV disease ([place]) dx 1999   MONITORED BY INFECTIOUS DISEASE -- DR [place]  . Hypertension   . Paroxysmal atrial fibrillation (HCC)    CARDIOLOGIST--  DR Rayann Heman  . Right knee meniscal tear    Social History   Social History Narrative   Pt lives in [place].  Unemployed.   Social History   Tobacco Use  . Smoking status: Never Smoker  . Smokeless tobacco: Never Used  Substance Use Topics  .  Alcohol use: Yes    Alcohol/week: 0.0 oz    Comment: rarely; 1-2 per month  . Drug use: No   family history includes Arrhythmia in his father; Colon cancer in his maternal grandfather; Heart attack in his brother; Hyperlipidemia in his mother; Hypertension in his mother; Lung cancer in his mother; Other in his maternal grandmother; Prostate cancer in his maternal grandfather.     Objective:  BP 130/70   Pulse 82   Temp 99 F (37.2 C) (Oral)   Resp 18   Ht 6' 1.5" (1.867 m)   Wt 263 lb 6.4 oz (119.5 kg)   SpO2 97%   BMI 34.28 kg/m  Body mass index is 34.28 kg/m.  Physical Exam  Constitutional: He is  oriented to person, place, and time. He appears well-developed and well-nourished.  HENT:  Head: Normocephalic and atraumatic.  Right Ear: External ear normal.  Left Ear: External ear normal.  Eyes: Conjunctivae are normal.  Neck: Normal range of motion.  Pulmonary/Chest: Effort normal.  Musculoskeletal:       Lumbar back: He exhibits tenderness (central low back and more off the the right side over the paralumbar spine), pain and spasm. He exhibits normal range of motion (pain with extension - tight with flexion).       Back:  Neurological: He is alert and oriented to person, place, and time. He has normal strength and normal reflexes. No sensory deficit. Gait normal.  Reflex Scores:      Patellar reflexes are 2+ on the right side and 2+ on the left side.      Achilles reflexes are 2+ on the right side and 2+ on the left side. Skin: Skin is warm and dry.  Psychiatric: Judgment normal.  Vitals reviewed.   Assessment and Plan :  Strain of lumbar region, initial encounter - Plan: cyclobenzaprine (FLEXERIL) 5 MG tablet, traMADol (ULTRAM) 50 MG tablet -patient had  an injury which caused him to twist his back resulting in likely muscular spasm contributing to pain.  He will use Flexeril 3 times a day for the next 2 days while he is off of work and then as needed after that.  We did give him Ultram for his pain as he cannot take anti-inflammatories due to medication reactions  Fall, initial encounter -   Lumbar pain -very mild tenderness to palpation over his lumbar spine.  His scar is identified but he does not have pain over this area.  X-ray was deferred due to improvement in symptoms of the last 24 hours.  He will use heat and gentle stretches for his back.  He will follow-up in 1 week if he is not improved sooner if he has any warning signs that were discussed with him at this visit.   Windell Hummingbird PA-C  [facility] 11/04/2017 12:27 PM

## 2018-03-17 ENCOUNTER — Other Ambulatory Visit: Payer: Self-pay | Admitting: *Deleted

## 2018-03-17 MED ORDER — DILTIAZEM HCL ER COATED BEADS 240 MG PO CP24: 240.0000 mg | ORAL_CAPSULE | Freq: Every day | ORAL | 0 refills | Status: DC

## 2018-03-17 MED ORDER — FLECAINIDE ACETATE 150 MG PO TABS: ORAL_TABLET | ORAL | 0 refills | Status: DC

## 2018-03-19 NOTE — Addendum Note (Signed)
Addended by: Janyce Llanos F on: 03/19/2018 12:18 PM   Modules accepted: Orders

## 2018-03-20 ENCOUNTER — Other Ambulatory Visit: Payer: 59

## 2018-03-20 ENCOUNTER — Other Ambulatory Visit (HOSPITAL_COMMUNITY)
Admission: RE | Admit: 2018-03-20 | Discharge: 2018-03-20 | Disposition: A | Discharge status: Home / Self Care | Payer: 59 | Source: Ambulatory Visit | Attending: Internal Medicine | Admitting: Internal Medicine

## 2018-03-20 DIAGNOSIS — Z113 Encounter for screening for infections with a predominantly sexual mode of transmission: Secondary | ICD-10-CM

## 2018-03-20 DIAGNOSIS — B20 Human immunodeficiency virus [HIV] disease: Secondary | ICD-10-CM | POA: Insufficient documentation

## 2018-03-20 DIAGNOSIS — Z79899 Other long term (current) drug therapy: Secondary | ICD-10-CM

## 2018-03-20 LAB — T-HELPER CELL (CD4) - (RCID CLINIC ONLY)
CD4 % Helper T Cell: 30 % — ABNORMAL LOW (ref 33–55)
CD4 T Cell Abs: 680 /uL (ref 400–2700)

## 2018-03-23 LAB — COMPLETE METABOLIC PANEL WITH GFR
AG Ratio: 1.4 (calc) (ref 1.0–2.5)
ALT: 28 U/L (ref 9–46)
AST: 26 U/L (ref 10–35)
Albumin: 4.3 g/dL (ref 3.6–5.1)
Alkaline phosphatase (APISO): 72 U/L (ref 40–115)
BUN: 18 mg/dL (ref 7–25)
CO2: 29 mmol/L (ref 20–32)
Calcium: 9.4 mg/dL (ref 8.6–10.3)
Chloride: 104 mmol/L (ref 98–110)
Creat: 1.15 mg/dL (ref 0.70–1.33)
GFR, Est African American: 81 mL/min/{1.73_m2} (ref >=60)
GFR, Est Non African American: 70 mL/min/{1.73_m2} (ref >=60)
Globulin: 3.1 g/dL (calc) (ref 1.9–3.7)
Glucose, Bld: 107 mg/dL — ABNORMAL HIGH (ref 65–99)
Potassium: 4.8 mmol/L (ref 3.5–5.3)
Sodium: 138 mmol/L (ref 135–146)
Total Bilirubin: 0.7 mg/dL (ref 0.2–1.2)
Total Protein: 7.4 g/dL (ref 6.1–8.1)

## 2018-03-23 LAB — RPR: RPR Ser Ql: NONREACTIVE

## 2018-03-23 LAB — CBC WITH DIFFERENTIAL/PLATELET
Basophils Absolute: 31 cells/uL (ref 0–200)
Basophils Relative: 0.5 %
Eosinophils Absolute: 159 cells/uL (ref 15–500)
Eosinophils Relative: 2.6 %
HCT: 41.8 % (ref 38.5–50.0)
Hemoglobin: 14.4 g/dL (ref 13.2–17.1)
Lymphs Abs: 2159 cells/uL (ref 850–3900)
MCH: 30.5 pg (ref 27.0–33.0)
MCHC: 34.4 g/dL (ref 32.0–36.0)
MCV: 88.6 fL (ref 80.0–100.0)
MPV: 10.1 fL (ref 7.5–12.5)
Monocytes Relative: 12.2 %
Neutro Abs: 3007 cells/uL (ref 1500–7800)
Neutrophils Relative %: 49.3 %
Platelets: 251 10*3/uL (ref 140–400)
RBC: 4.72 10*6/uL (ref 4.20–5.80)
RDW: 13.6 % (ref 11.0–15.0)
Total Lymphocyte: 35.4 %
WBC mixed population: 744 cells/uL (ref 200–950)
WBC: 6.1 10*3/uL (ref 3.8–10.8)

## 2018-03-23 LAB — LIPID PANEL
Cholesterol: 165 mg/dL (ref <200)
HDL: 26 mg/dL — ABNORMAL LOW (ref >40)
LDL Cholesterol (Calc): 97 mg/dL (calc)
Non-HDL Cholesterol (Calc): 139 mg/dL (calc) — ABNORMAL HIGH (ref <130)
Total CHOL/HDL Ratio: 6.3 (calc) — ABNORMAL HIGH (ref <5.0)
Triglycerides: 291 mg/dL — ABNORMAL HIGH (ref <150)

## 2018-03-23 LAB — URINE CYTOLOGY ANCILLARY ONLY
Chlamydia: NEGATIVE
Neisseria Gonorrhea: NEGATIVE

## 2018-03-23 LAB — HIV-1 RNA QUANT-NO REFLEX-BLD
HIV 1 RNA Quant: <20 copies/mL
HIV-1 RNA Quant, Log: <1.3 Log copies/mL

## 2018-04-06 ENCOUNTER — Other Ambulatory Visit: Payer: Self-pay | Admitting: Internal Medicine

## 2018-04-06 ENCOUNTER — Encounter: Payer: Self-pay | Admitting: Internal Medicine

## 2018-04-14 ENCOUNTER — Encounter: Payer: Self-pay | Admitting: Internal Medicine

## 2018-04-23 ENCOUNTER — Other Ambulatory Visit: Payer: Self-pay

## 2018-04-23 ENCOUNTER — Encounter: Payer: 59 | Admitting: Internal Medicine

## 2018-04-23 ENCOUNTER — Emergency Department (HOSPITAL_COMMUNITY): Payer: 59

## 2018-04-23 ENCOUNTER — Emergency Department (HOSPITAL_COMMUNITY)
Admission: EM | Admit: 2018-04-23 | Discharge: 2018-04-24 | Disposition: E | Payer: 59 | Attending: Emergency Medicine | Admitting: Emergency Medicine

## 2018-04-23 ENCOUNTER — Encounter (HOSPITAL_COMMUNITY): Payer: Self-pay | Admitting: Emergency Medicine

## 2018-04-23 DIAGNOSIS — S46912A Strain of unspecified muscle, fascia and tendon at shoulder and upper arm level, left arm, initial encounter: Secondary | ICD-10-CM | POA: Insufficient documentation

## 2018-04-23 DIAGNOSIS — B20 Human immunodeficiency virus [HIV] disease: Secondary | ICD-10-CM | POA: Insufficient documentation

## 2018-04-23 DIAGNOSIS — Y92410 Unspecified street and highway as the place of occurrence of the external cause: Secondary | ICD-10-CM | POA: Insufficient documentation

## 2018-04-23 DIAGNOSIS — Y9389 Activity, other specified: Secondary | ICD-10-CM | POA: Insufficient documentation

## 2018-04-23 DIAGNOSIS — Y999 Unspecified external cause status: Secondary | ICD-10-CM | POA: Insufficient documentation

## 2018-04-23 DIAGNOSIS — Z85828 Personal history of other malignant neoplasm of skin: Secondary | ICD-10-CM | POA: Diagnosis not present

## 2018-04-23 DIAGNOSIS — Z79899 Other long term (current) drug therapy: Secondary | ICD-10-CM | POA: Insufficient documentation

## 2018-04-23 DIAGNOSIS — I10 Essential (primary) hypertension: Secondary | ICD-10-CM | POA: Diagnosis not present

## 2018-04-23 DIAGNOSIS — S4992XA Unspecified injury of left shoulder and upper arm, initial encounter: Secondary | ICD-10-CM | POA: Diagnosis present

## 2018-04-23 DIAGNOSIS — Z7901 Long term (current) use of anticoagulants: Secondary | ICD-10-CM | POA: Diagnosis not present

## 2018-04-23 MED ORDER — METHOCARBAMOL 750 MG PO TABS: 750.0000 mg | ORAL_TABLET | Freq: Four times a day (QID) | ORAL | 0 refills | Status: DC

## 2018-04-23 NOTE — ED Notes (Signed)
Patient transported to X-ray 
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
## 2018-04-23 NOTE — ED Notes (Signed)
Pt offered work note but declined due to his scheduled date of return to work being longer than note offered. Pt will f/u w/ PCM and ortho team for further eval.
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
## 2018-04-23 NOTE — ED Notes (Addendum)
Family at bedside. 

## 2018-04-24 DEATH — deceased

## 2018-04-28 ENCOUNTER — Encounter (INDEPENDENT_AMBULATORY_CARE_PROVIDER_SITE_OTHER): Payer: Self-pay | Admitting: Orthopaedic Surgery

## 2018-04-28 ENCOUNTER — Ambulatory Visit (INDEPENDENT_AMBULATORY_CARE_PROVIDER_SITE_OTHER): Payer: 59 | Admitting: Orthopaedic Surgery

## 2018-04-28 ENCOUNTER — Other Ambulatory Visit: Payer: Self-pay | Admitting: Internal Medicine

## 2018-04-28 VITALS — Ht 73.5 in | Wt 262.4 lb

## 2018-04-28 DIAGNOSIS — M25512 Pain in left shoulder: Secondary | ICD-10-CM
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
## 2018-04-29 ENCOUNTER — Other Ambulatory Visit: Payer: Self-pay | Admitting: Internal Medicine

## 2018-05-04 ENCOUNTER — Ambulatory Visit: Payer: Self-pay | Admitting: Internal Medicine

## 2018-05-11 ENCOUNTER — Ambulatory Visit: Payer: 59 | Admitting: Internal Medicine

## 2018-05-11 ENCOUNTER — Encounter: Payer: Self-pay | Admitting: Internal Medicine

## 2018-05-11 VITALS — BP 153/95 | HR 67 | Temp 98.0°F | Ht 72.0 in | Wt 262.5 lb

## 2018-05-11 VITALS — BP 134/86 | HR 64 | Ht 73.0 in | Wt 269.4 lb

## 2018-05-11 DIAGNOSIS — Z79899 Other long term (current) drug therapy: Secondary | ICD-10-CM | POA: Diagnosis not present

## 2018-05-11 DIAGNOSIS — Z113 Encounter for screening for infections with a predominantly sexual mode of transmission: Secondary | ICD-10-CM

## 2018-05-11 DIAGNOSIS — G4733 Obstructive sleep apnea (adult) (pediatric): Secondary | ICD-10-CM | POA: Diagnosis not present

## 2018-05-11 DIAGNOSIS — I10 Essential (primary) hypertension: Secondary | ICD-10-CM | POA: Diagnosis not present

## 2018-05-11 DIAGNOSIS — I480 Paroxysmal atrial fibrillation: Secondary | ICD-10-CM | POA: Diagnosis not present

## 2018-05-11 DIAGNOSIS — B20 Human immunodeficiency virus [HIV] disease: Secondary | ICD-10-CM | POA: Diagnosis not present

## 2018-05-11 DIAGNOSIS — Z23 Encounter for immunization: Secondary | ICD-10-CM | POA: Diagnosis not present

## 2018-05-11 DIAGNOSIS — Z01810 Encounter for preprocedural cardiovascular examination: Secondary | ICD-10-CM | POA: Insufficient documentation

## 2018-05-11 LAB — CBC WITH DIFFERENTIAL/PLATELET
Basophils Absolute: 0.1 10*3/uL (ref 0.0–0.2)
Basos: 1 %
EOS (ABSOLUTE): 0.1 10*3/uL (ref 0.0–0.4)
Eos: 2 %
Hematocrit: 43.9 % (ref 37.5–51.0)
Hemoglobin: 15.2 g/dL (ref 13.0–17.7)
Immature Grans (Abs): 0 10*3/uL (ref 0.0–0.1)
Immature Granulocytes: 0 %
Lymphocytes Absolute: 2.4 10*3/uL (ref 0.7–3.1)
Lymphs: 32 %
MCH: 30.5 pg (ref 26.6–33.0)
MCHC: 34.6 g/dL (ref 31.5–35.7)
MCV: 88 fL (ref 79–97)
Monocytes Absolute: 0.8 10*3/uL (ref 0.1–0.9)
Monocytes: 10 %
Neutrophils Absolute: 4.1 10*3/uL (ref 1.4–7.0)
Neutrophils: 55 %
Platelets: 276 10*3/uL (ref 150–450)
RBC: 4.99 x10E6/uL (ref 4.14–5.80)
RDW: 13.2 % (ref 12.3–15.4)
WBC: 7.5 10*3/uL (ref 3.4–10.8)

## 2018-05-11 LAB — BASIC METABOLIC PANEL
BUN/Creatinine Ratio: 17 (ref 9–20)
BUN: 17 mg/dL (ref 6–24)
CO2: 22 mmol/L (ref 20–29)
Calcium: 9.6 mg/dL (ref 8.7–10.2)
Chloride: 102 mmol/L (ref 96–106)
Creatinine, Ser: 0.99 mg/dL (ref 0.76–1.27)
GFR calc Af Amer: 97 mL/min/{1.73_m2} (ref >59)
GFR calc non Af Amer: 84 mL/min/{1.73_m2} (ref >59)
Glucose: 112 mg/dL — ABNORMAL HIGH (ref 65–99)
Potassium: 4.4 mmol/L (ref 3.5–5.2)
Sodium: 140 mmol/L (ref 134–144)

## 2018-05-11 MED ORDER — DILTIAZEM HCL ER COATED BEADS 240 MG PO CP24: 240.0000 mg | ORAL_CAPSULE | Freq: Every day | ORAL | 3 refills | Status: DC

## 2018-05-11 MED ORDER — FLECAINIDE ACETATE 100 MG PO TABS: 100.0000 mg | ORAL_TABLET | Freq: Two times a day (BID) | ORAL | 3 refills | Status: DC

## 2018-05-11 MED ORDER — METOPROLOL TARTRATE 100 MG PO TABS: 100.0000 mg | ORAL_TABLET | Freq: Once | ORAL | 0 refills | Status: DC

## 2018-05-11 NOTE — H&P (View-Only) (Signed)
PCP: Patient, No Pcp Per   Primary EP: Dr Rayann Heman  Ethan Lambert is a 57 y.o. male who presents today for routine electrophysiology followup.  Since last being seen in our clinic, the patient reports doing very well.  He has continues to have increasing frequency and duration of atrial fibrillation.  Episodes occur about every 2 weeks and terminate with pill in pocket flecainide.  He has symptoms of palpitations and fatigue.  He has not had much success with weight loss.  He is not treating his OSA.  Today, he denies symptoms of palpitations, chest pain, shortness of breath,  lower extremity edema, dizziness, presyncope, or syncope.  The patient is otherwise without complaint today.   Past Medical History:  Diagnosis Date  . Anemia   . Blood transfusion without reported diagnosis   . Cancer ([place])    skin  . Family history of malignant neoplasm of gastrointestinal tract   . 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 History of kidney stones   . History of nonmelanoma skin cancer   . HIV disease ([place]) dx 1999   MONITORED BY INFECTIOUS DISEASE -- DR [place]  . Hypertension   . Paroxysmal atrial fibrillation ([facility])    CARDIOLOGIST--  DR Rayann Heman  . Right knee meniscal tear    Past Surgical History:  Procedure Laterality Date  . APPENDECTOMY  06-09-2003  . HERNIA REPAIR    . INGUINAL HERNIA REPAIR Bilateral 1990's  . KNEE ARTHROSCOPY WITH MEDIAL MENISECTOMY Right 12/31/2013   Procedure: RIGHT KNEE ARTHROSCOPY PARTIAL MEDIAL MENISCECTOMY DEBRIDEMENT AND CHONDROPLASTY ;  Surgeon: Sydnee Cabal, MD;  Location: [facility];  Service: Orthopedics;  Laterality: Right;  . LAPAROSCOPIC CHOLECYSTECTOMY  07-08-2003  . LASER ABLATION ANAL CONDYLOMA  05-18-2001  . LUMBAR LAMINECTOMY/DECOMPRESSION MICRODISCECTOMY Left 03/15/2015   Procedure: Left Lumbar five- sacral one microdiskectomy;  Surgeon: Consuella Lose, MD;  Location: [facility];  Service: Neurosurgery;  Laterality: Left;  Left L5S1 microdiskectomy  . NEGATIVE SLEEP STUDY  01-03-2012  in epic  . TRANSTHORACIC ECHOCARDIOGRAM  12-01-2008   MODERATE LVH/  EF 55-60%/  MILD MR/  NEGATIVE BUBBLE STUDY    ROS- all systems are reviewed and negatives except as per HPI above  Current Outpatient Medications  Medication Sig Dispense Refill  . diltiazem (CARDIZEM CD) 240 MG 24 hr capsule TAKE 1 CAPSULE BY MOUTH  DAILY 90 capsule 0  . ELIQUIS 5 MG TABS tablet TAKE 1 TABLET BY MOUTH  TWICE A DAY 180 tablet 0  . flecainide (TAMBOCOR) 150 MG tablet TAKE 2 TABLETS BY MOUTH FOR BREAKTHROUGH AFIB. 4 DAYS  BETWEEN DOSES. 45 tablet 0  . TRIUMEQ 600-50-300 MG tablet TAKE 1 TABLET BY MOUTH  EVERY DAY 30 tablet 0   No current facility-administered medications for this visit.     Physical Exam: Vitals:   05/11/18 0827  BP: 134/86  Pulse: 64  SpO2: 97%  Weight: 269 lb 6.4 oz (122.2 kg)  Height: 6\' 1"  (1.854 m)    GEN- The patient is well appearing, alert and oriented x 3 today.   Head- normocephalic, atraumatic Eyes-  Sclera clear, conjunctiva pink Ears- hearing intact Oropharynx- clear Lungs- Clear to ausculation bilaterally, normal work of breathing Heart- Regular rate and rhythm, no murmurs, rubs or gallops, PMI not laterally displaced GI- soft, NT, ND, + BS Extremities- no clubbing, cyanosis, or edema  Wt Readings from Last  3 Encounters:  05/11/18 269 lb 6.4 oz (122.2 kg)  04/28/18 262 lb 5.6 oz (119 kg)  04/04/2018 262 lb 5.6 oz (119 kg)    EKG tracing ordered today is personally reviewed and shows sinus rhythm, PR 212 msec, QRS 122 msec, IVCD Qtc 441 msec  Assessment and Plan:  1. Paroxysmal atrial fibrillation The patient has symptomatic, recurrent paroxysmal atrial fibrillation. he has failed medical therapy with diltiazem and flecainide.  Today, I will change his flecainide to 100mg  BID from prn.  He will return in 1 week  for an ekg. Chads2vasc score is 3.  he is anticoagulated with eliquis . Therapeutic strategies for afib including medicine and ablation were discussed in detail with the patient today. Risk, benefits, and alternatives to EP study and radiofrequency ablation for afib were also discussed in detail today. These risks include but are not limited to stroke, bleeding, vascular damage, tamponade, perforation, damage to the esophagus, lungs, and other structures, pulmonary vein stenosis, worsening renal function, and death. The patient understands these risk and wishes to proceed.  We will therefore proceed with catheter ablation at the next available time.  Carto, ICE, anesthesia are requested for the procedure.  Will also obtain cardiac CT prior to the procedure to exclude LAA thrombus and further evaluate atrial anatomy.  2. OSA Importance of treatment discussed at length today He could not afford the out of pocket costs of dental appliance offered by Dr Ron Parker.  3. Obesity Body mass index is 35.54 kg/m. Lifestyle modification discussed at length today  4. HTN Stable No change required today  Thompson Grayer MD, [facility] 05/11/2018 9:13 AM
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
## 2018-05-11 NOTE — Assessment & Plan Note (Signed)
Doing well and can rtc in 1 year.

## 2018-05-11 NOTE — Assessment & Plan Note (Signed)
Screened negative 

## 2018-05-11 NOTE — Patient Instructions (Addendum)
Medication Instructions:  Your physician has recommended you make the following change in your medication:   1.  Change your flecainide--you will no longer use your flecainide as a "pill in pocket" You are going to start flecainide 100 mg-- Take one tablet by mouth twice a day.    Labwork: You will get lab work today: BMP and CBC  Testing/Procedures:  You will need a nurse visit for a 12 lead EKG 7 days after starting flecainide. Please schedule nurse visit.  Your physician has requested that you have cardiac CT. Cardiac computed tomography (CT) is a painless test that uses an x-ray machine to take clear, detailed pictures of your heart. For further information please visit [facility]. Please follow instruction sheet as given.  You will get a call from our office to schedule the date for this test.  Your physician has recommended that you have an ablation. Catheter ablation is a medical procedure used to treat some cardiac arrhythmias (irregular heartbeats). During catheter ablation, a long, thin, flexible tube is put into a blood vessel in your groin (upper thigh), or neck. This tube is called an ablation catheter. It is then guided to your heart through the blood vessel. Radio frequency waves destroy small areas of heart tissue where abnormal heartbeats may cause an arrhythmia to start. Please see the instruction sheet given to you today.  Follow-Up: You will follow up with Roderic Palau, NP with the Atrial fibrillation (Afib) clinic 4 weeks after your ablation.  You will follow up with Dr. Rayann Heman 3 months after your procedure.    CARDIAC CT INSTRUCTIONS:  Please arrive at the [facility] main entrance of [facility] (30-45 minutes prior to test start time)  [facility], [place] [address] [phone]  Proceed to the [facility] Radiology Department (First Floor).  Please follow these instructions carefully (unless otherwise  directed):  On the Night Before the Test: . Be sure to Drink plenty of water. . Do not consume any caffeinated/decaffeinated beverages or chocolate 12 hours prior to your test. . Do not take any antihistamines 12 hours prior to your test.  On the Day of the Test: . Drink plenty of water. Do not drink any water within one hour of the test. . Do not eat any food 4 hours prior to the test. . You may take your regular medications prior to the test.  . Take metoprolol (Lopressor) two hours prior to test.                 -If HR is less than 55 BPM- No Beta Blocker                -IF HR is greater than 55 BPM and patient is less than or equal to 10 yrs old Lopressor 100mg  x1.                  After the Test: . Drink plenty of water. . After receiving IV contrast, you may experience a mild flushed feeling. This is normal. . On occasion, you may experience a mild rash up to 24 hours after the test. This is not dangerous. If this occurs, you can take Benadryl 25 mg and increase your fluid intake. . If you experience trouble breathing, this can be serious. If it is severe call 911 IMMEDIATELY. If it is mild, please call our office.    ABLATION INSTRUCTIONS:  Please arrive to ADMITTING down the hall from  the [facility] main entrance of [facility] hospital at: 8:30 am on May 28, 2018 Do not eat or drink after midnight prior to procedure On the morning of your procedure do not take any medications. Plan for one night stay.   You will need someone to drive you home at discharge.    Cardiac Ablation Cardiac ablation is a procedure to disable (ablate) a small amount of heart tissue in very specific places. The heart has many electrical connections. Sometimes these connections are abnormal and can cause the heart to beat very fast or irregularly. Ablating some of the problem areas can improve the heart rhythm or return it to normal. Ablation may be done for people who:  Have  Wolff-Parkinson-White syndrome.  Have fast heart rhythms (tachycardia).  Have taken medicines for an abnormal heart rhythm (arrhythmia) that were not effective or caused side effects.  Have a high-risk heartbeat that may be life-threatening.  During the procedure, a small incision is made in the neck or the groin, and a long, thin, flexible tube (catheter) is inserted into the incision and moved to the heart. Small devices (electrodes) on the tip of the catheter will send out electrical currents. A type of X-ray (fluoroscopy) will be used to help guide the catheter and to provide images of the heart. Tell a health care provider about:  Any allergies you have.  All medicines you are taking, including vitamins, herbs, eye drops, creams, and over-the-counter medicines.  Any problems you or family members have had with anesthetic medicines.  Any blood disorders you have.  Any surgeries you have had.  Any medical conditions you have, such as kidney failure.  Whether you are pregnant or may be pregnant. What are the risks? Generally, this is a safe procedure. However, problems may occur, including:  Infection.  Bruising and bleeding at the catheter insertion site.  Bleeding into the chest, especially into the sac that surrounds the heart. This is a serious complication.  Stroke or blood clots.  Damage to other structures or organs.  Allergic reaction to medicines or dyes.  Need for a permanent pacemaker if the normal electrical system is damaged. A pacemaker is a small computer that sends electrical signals to the heart and helps your heart beat normally.  The procedure not being fully effective. This may not be recognized until months later. Repeat ablation procedures are sometimes required.  What happens before the procedure?  Follow instructions from your health care provider about eating or drinking restrictions.  Ask your health care provider about: ? Changing or  stopping your regular medicines. This is especially important if you are taking diabetes medicines or blood thinners. ? Taking medicines such as aspirin and ibuprofen. These medicines can thin your blood. Do not take these medicines before your procedure if your health care provider instructs you not to.  Plan to have someone take you home from the hospital or clinic.  If you will be going home right after the procedure, plan to have someone with you for 24 hours. What happens during the procedure?  To lower your risk of infection: ? Your health care team will wash or sanitize their hands. ? Your skin will be washed with soap. ? Hair may be removed from the incision area.  An IV tube will be inserted into one of your veins.  You will be given a medicine to help you relax (sedative).  The skin on your neck or groin will be numbed.  An incision  will be made in your neck or your groin.  A needle will be inserted through the incision and into a large vein in your neck or groin.  A catheter will be inserted into the needle and moved to your heart.  Dye may be injected through the catheter to help your surgeon see the area of the heart that needs treatment.  Electrical currents will be sent from the catheter to ablate heart tissue in desired areas. There are three types of energy that may be used to ablate heart tissue: ? Heat (radiofrequency energy). ? Laser energy. ? Extreme cold (cryoablation).  When the necessary tissue has been ablated, the catheter will be removed.  Pressure will be held on the catheter insertion area to prevent excessive bleeding.  A bandage (dressing) will be placed over the catheter insertion area. The procedure may vary among health care providers and hospitals. What happens after the procedure?  Your blood pressure, heart rate, breathing rate, and blood oxygen level will be monitored until the medicines you were given have worn off.  Your catheter  insertion area will be monitored for bleeding. You will need to lie still for a few hours to ensure that you do not bleed from the catheter insertion area.  Do not drive for 24 hours or as long as directed by your health care provider. Summary  Cardiac ablation is a procedure to disable (ablate) a small amount of heart tissue in very specific places. Ablating some of the problem areas can improve the heart rhythm or return it to normal.  During the procedure, electrical currents will be sent from the catheter to ablate heart tissue in desired areas. This information is not intended to replace advice given to you by your health care provider. Make sure you discuss any questions you have with your health care provider. Document Released: 10/27/2008 Document Revised: 04/29/2016 Document Reviewed: 04/29/2016 Elsevier Interactive Patient Education  [employer].

## 2018-05-11 NOTE — Assessment & Plan Note (Signed)
Prevnar and flu shot given today

## 2018-05-11 NOTE — Progress Notes (Signed)
PCP: Patient, No Pcp Per   Primary EP: Dr Rayann Heman  DAEJON LICH is a 57 y.o. male who presents today for routine electrophysiology followup.  Since last being seen in our clinic, the patient reports doing very well.  He has continues to have increasing frequency and duration of atrial fibrillation.  Episodes occur about every 2 weeks and terminate with pill in pocket flecainide.  He has symptoms of palpitations and fatigue.  He has not had much success with weight loss.  He is not treating his OSA.  Today, he denies symptoms of palpitations, chest pain, shortness of breath,  lower extremity edema, dizziness, presyncope, or syncope.  The patient is otherwise without complaint today.   Past Medical History:  Diagnosis Date  . Anemia   . Blood transfusion without reported diagnosis   . Cancer ([place])    skin  . Family history of malignant neoplasm of gastrointestinal tract   . 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 History of kidney stones   . History of nonmelanoma skin cancer   . HIV disease ([place]) dx 1999   MONITORED BY INFECTIOUS DISEASE -- DR Glenaire  . Hypertension   . Paroxysmal atrial fibrillation (HCC)    CARDIOLOGIST--  DR Rayann Heman  . Right knee meniscal tear    Past Surgical History:  Procedure Laterality Date  . APPENDECTOMY  06-09-2003  . HERNIA REPAIR    . INGUINAL HERNIA REPAIR Bilateral 1990's  . KNEE ARTHROSCOPY WITH MEDIAL MENISECTOMY Right 12/31/2013   Procedure: RIGHT KNEE ARTHROSCOPY PARTIAL MEDIAL MENISCECTOMY DEBRIDEMENT AND CHONDROPLASTY ;  Surgeon: Sydnee Cabal, MD;  Location: [facility];  Service: Orthopedics;  Laterality: Right;  . LAPAROSCOPIC CHOLECYSTECTOMY  07-08-2003  . LASER ABLATION ANAL CONDYLOMA  05-18-2001  . LUMBAR LAMINECTOMY/DECOMPRESSION MICRODISCECTOMY Left 03/15/2015   Procedure: Left Lumbar five- sacral one microdiskectomy;  Surgeon: Consuella Lose, MD;  Location: [facility];  Service: Neurosurgery;  Laterality: Left;  Left L5S1 microdiskectomy  . NEGATIVE SLEEP STUDY  01-03-2012  in epic  . TRANSTHORACIC ECHOCARDIOGRAM  12-01-2008   MODERATE LVH/  EF 55-60%/  MILD MR/  NEGATIVE BUBBLE STUDY    ROS- all systems are reviewed and negatives except as per HPI above  Current Outpatient Medications  Medication Sig Dispense Refill  . diltiazem (CARDIZEM CD) 240 MG 24 hr capsule TAKE 1 CAPSULE BY MOUTH  DAILY 90 capsule 0  . ELIQUIS 5 MG TABS tablet TAKE 1 TABLET BY MOUTH  TWICE A DAY 180 tablet 0  . flecainide (TAMBOCOR) 150 MG tablet TAKE 2 TABLETS BY MOUTH FOR BREAKTHROUGH AFIB. 4 DAYS  BETWEEN DOSES. 45 tablet 0  . TRIUMEQ 600-50-300 MG tablet TAKE 1 TABLET BY MOUTH  EVERY DAY 30 tablet 0   No current facility-administered medications for this visit.     Physical Exam: Vitals:   05/11/18 0827  BP: 134/86  Pulse: 64  SpO2: 97%  Weight: 269 lb 6.4 oz (122.2 kg)  Height: 6\' 1"  (1.854 m)    GEN- The patient is well appearing, alert and oriented x 3 today.   Head- normocephalic, atraumatic Eyes-  Sclera clear, conjunctiva pink Ears- hearing intact Oropharynx- clear Lungs- Clear to ausculation bilaterally, normal work of breathing Heart- Regular rate and rhythm, no murmurs, rubs or gallops, PMI not laterally displaced GI- soft, NT, ND, + BS Extremities- no clubbing, cyanosis, or edema  Wt Readings from Last  3 Encounters:  05/11/18 269 lb 6.4 oz (122.2 kg)  04/28/18 262 lb 5.6 oz (119 kg)  04/11/2018 262 lb 5.6 oz (119 kg)    EKG tracing ordered today is personally reviewed and shows sinus rhythm, PR 212 msec, QRS 122 msec, IVCD Qtc 441 msec  Assessment and Plan:  1. Paroxysmal atrial fibrillation The patient has symptomatic, recurrent paroxysmal atrial fibrillation. he has failed medical therapy with diltiazem and flecainide.  Today, I will change his flecainide to 100mg  BID from prn.  He will return in 1 week  for an ekg. Chads2vasc score is 3.  he is anticoagulated with eliquis . Therapeutic strategies for afib including medicine and ablation were discussed in detail with the patient today. Risk, benefits, and alternatives to EP study and radiofrequency ablation for afib were also discussed in detail today. These risks include but are not limited to stroke, bleeding, vascular damage, tamponade, perforation, damage to the esophagus, lungs, and other structures, pulmonary vein stenosis, worsening renal function, and death. The patient understands these risk and wishes to proceed.  We will therefore proceed with catheter ablation at the next available time.  Carto, ICE, anesthesia are requested for the procedure.  Will also obtain cardiac CT prior to the procedure to exclude LAA thrombus and further evaluate atrial anatomy.  2. OSA Importance of treatment discussed at length today He could not afford the out of pocket costs of dental appliance offered by Dr Ron Parker.  3. Obesity Body mass index is 35.54 kg/m. Lifestyle modification discussed at length today  4. HTN Stable No change required today  Thompson Grayer MD, [facility] 05/11/2018 9:13 AM

## 2018-05-19 ENCOUNTER — Ambulatory Visit (INDEPENDENT_AMBULATORY_CARE_PROVIDER_SITE_OTHER): Payer: 59

## 2018-05-19 ENCOUNTER — Ambulatory Visit (INDEPENDENT_AMBULATORY_CARE_PROVIDER_SITE_OTHER): Payer: 59 | Admitting: Orthopaedic Surgery

## 2018-05-19 ENCOUNTER — Encounter (INDEPENDENT_AMBULATORY_CARE_PROVIDER_SITE_OTHER): Payer: Self-pay | Admitting: Orthopaedic Surgery

## 2018-05-19 VITALS — BP 138/84 | HR 70 | Ht 73.0 in | Wt 272.8 lb

## 2018-05-19 DIAGNOSIS — I480 Paroxysmal atrial fibrillation: Secondary | ICD-10-CM | POA: Diagnosis not present

## 2018-05-19 DIAGNOSIS — M7552 Bursitis of left shoulder: Secondary | ICD-10-CM | POA: Diagnosis not present

## 2018-05-19 MED ORDER — METHYLPREDNISOLONE ACETATE 40 MG/ML IJ SUSP
40.0000 mg | INTRAMUSCULAR | Status: AC | PRN
  Administered 2018-05-19: 40 mg via INTRA_ARTICULAR

## 2018-05-19 MED ORDER — LIDOCAINE HCL 2 % IJ SOLN
2.0000 mL | INTRAMUSCULAR | Status: AC | PRN
  Administered 2018-05-19: 2 mL

## 2018-05-19 MED ORDER — BUPIVACAINE HCL 0.25 % IJ SOLN
2.0000 mL | INTRAMUSCULAR | Status: AC | PRN
  Administered 2018-05-19: 2 mL via INTRA_ARTICULAR

## 2018-05-19 NOTE — Patient Instructions (Signed)
Please keep all your upcoming appointments.
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
## 2018-05-19 NOTE — Progress Notes (Signed)
Mr. Ethan Lambert is in today for EKG one week after increasing Flecainide from PRN to 100 mg BID. Since increasing Flecainide, he had one instance of afib. He states he called the on-call provider and was told since his heart was not racing to "wait it out." The episode stopped after about 2 hours. He has no complaints today.  EKG reviewed with Dr. Curt Bears, who states to continue current treatment plan. EKG uploaded into Epic.  Upcoming appointments confirmed with patient.   Today: HR= 70 BP= 138/84  He understands he will be called if Dr. Rayann Heman has any further recommendations to add.

## 2018-05-25 ENCOUNTER — Other Ambulatory Visit: Payer: Self-pay | Admitting: Internal Medicine

## 2018-05-26 ENCOUNTER — Ambulatory Visit (HOSPITAL_COMMUNITY)
Admission: RE | Admit: 2018-05-26 | Discharge: 2018-05-26 | Disposition: A | Discharge status: Home / Self Care | Payer: 59 | Source: Ambulatory Visit | Attending: Internal Medicine | Admitting: Internal Medicine

## 2018-05-26 ENCOUNTER — Ambulatory Visit (HOSPITAL_COMMUNITY): Admission: RE | Admit: 2018-05-26 | Payer: 59 | Source: Ambulatory Visit

## 2018-05-26 DIAGNOSIS — I480 Paroxysmal atrial fibrillation: Secondary | ICD-10-CM | POA: Diagnosis present

## 2018-05-26 MED ORDER — IOPAMIDOL (ISOVUE-370) INJECTION 76%
80.0000 mL | Freq: Once | INTRAVENOUS | Status: AC | PRN
  Administered 2018-05-26: 80 mL via INTRAVENOUS

## 2018-05-27 NOTE — Anesthesia Preprocedure Evaluation (Addendum)
Anesthesia Evaluation  Patient identified by MRN, date of birth, ID band Patient awake    Reviewed: Allergy & Precautions, NPO status , Patient's Chart, lab work & pertinent test results  History of Anesthesia Complications Negative for: history of anesthetic complications  Airway Mallampati: II  TM Distance: >3 FB Neck ROM: Full    Dental  (+) Dental Advisory Given, Missing,    Pulmonary neg pulmonary ROS,    Pulmonary exam normal breath sounds clear to auscultation       Cardiovascular hypertension, Pt. on home beta blockers and Pt. on medications Normal cardiovascular exam+ dysrhythmias Atrial Fibrillation  Rhythm:Regular Rate:Normal  TTE 2017: Normal LV size with mild LV hypertrophy. EF 55-60%. Normal diastolic function. Normal RV size and systolic function.   Neuro/Psych CVA, No Residual Symptoms    GI/Hepatic negative GI ROS, Neg liver ROS,   Endo/Other  negative endocrine ROS  Renal/GU negative Renal ROS     Musculoskeletal  (+) Arthritis , Osteoarthritis,    Abdominal   Peds  Hematology  (+) HIV,   Anesthesia Other Findings Day of surgery medications reviewed with the patient.  Reproductive/Obstetrics                           Anesthesia Physical Anesthesia Plan  ASA: II  Anesthesia Plan: General   Post-op Pain Management:    Induction: Intravenous  PONV Risk Score and Plan: 2 and Treatment may vary due to age or medical condition, Ondansetron, Dexamethasone and Midazolam  Airway Management Planned: Oral ETT  Additional Equipment:   Intra-op Plan:   Post-operative Plan: Extubation in OR  Informed Consent:   Dental advisory given  Plan Discussed with: CRNA, Anesthesiologist and Surgeon  Anesthesia Plan Comments:       Anesthesia Quick Evaluation

## 2018-05-28 ENCOUNTER — Ambulatory Visit (HOSPITAL_COMMUNITY)
Admission: RE | Admit: 2018-05-28 | Discharge: 2018-05-29 | Disposition: A | Discharge status: Home / Self Care | Payer: 59 | Source: Ambulatory Visit | Attending: Internal Medicine | Admitting: Internal Medicine

## 2018-05-28 ENCOUNTER — Ambulatory Visit (HOSPITAL_COMMUNITY): Payer: 59 | Admitting: Anesthesiology

## 2018-05-28 ENCOUNTER — Encounter (HOSPITAL_COMMUNITY)
Admission: RE | Disposition: A | Discharge status: Home / Self Care | Payer: Self-pay | Source: Ambulatory Visit | Attending: Internal Medicine

## 2018-05-28 ENCOUNTER — Encounter (HOSPITAL_COMMUNITY): Payer: Self-pay | Admitting: Certified Registered Nurse Anesthetist

## 2018-05-28 ENCOUNTER — Other Ambulatory Visit: Payer: Self-pay

## 2018-05-28 DIAGNOSIS — Z8673 Personal history of transient ischemic attack (TIA), and cerebral infarction without residual deficits: Secondary | ICD-10-CM | POA: Insufficient documentation

## 2018-05-28 DIAGNOSIS — Z981 Arthrodesis status: Secondary | ICD-10-CM | POA: Insufficient documentation

## 2018-05-28 DIAGNOSIS — Z885 Allergy status to narcotic agent status: Secondary | ICD-10-CM | POA: Diagnosis not present

## 2018-05-28 DIAGNOSIS — G4733 Obstructive sleep apnea (adult) (pediatric): Secondary | ICD-10-CM | POA: Insufficient documentation

## 2018-05-28 DIAGNOSIS — I480 Paroxysmal atrial fibrillation: Secondary | ICD-10-CM | POA: Diagnosis present

## 2018-05-28 DIAGNOSIS — Z9049 Acquired absence of other specified parts of digestive tract: Secondary | ICD-10-CM | POA: Insufficient documentation

## 2018-05-28 DIAGNOSIS — B20 Human immunodeficiency virus [HIV] disease: Secondary | ICD-10-CM | POA: Insufficient documentation

## 2018-05-28 DIAGNOSIS — E669 Obesity, unspecified: Secondary | ICD-10-CM | POA: Diagnosis not present

## 2018-05-28 DIAGNOSIS — I10 Essential (primary) hypertension: Secondary | ICD-10-CM | POA: Diagnosis not present

## 2018-05-28 DIAGNOSIS — Z6835 Body mass index (BMI) 35.0-35.9, adult: Secondary | ICD-10-CM | POA: Diagnosis not present

## 2018-05-28 DIAGNOSIS — Z79899 Other long term (current) drug therapy: Secondary | ICD-10-CM | POA: Insufficient documentation

## 2018-05-28 DIAGNOSIS — Z7901 Long term (current) use of anticoagulants: Secondary | ICD-10-CM | POA: Diagnosis not present

## 2018-05-28 HISTORY — DX: Unspecified asthma, uncomplicated: J45.909

## 2018-05-28 HISTORY — DX: Cardiac murmur, unspecified: R01.1

## 2018-05-28 HISTORY — DX: Personal history of other medical treatment: Z92.89

## 2018-05-28 HISTORY — DX: Cerebral infarction, unspecified: I63.9

## 2018-05-28 HISTORY — DX: Sleep apnea, unspecified: G47.30

## 2018-05-28 HISTORY — PX: ATRIAL FIBRILLATION ABLATION: EP1191

## 2018-05-28 HISTORY — DX: Unspecified osteoarthritis, unspecified site: M19.90

## 2018-05-28 LAB — POCT ACTIVATED CLOTTING TIME: Activated Clotting Time: 169 seconds

## 2018-05-28 SURGERY — ATRIAL FIBRILLATION ABLATION
Anesthesia: General

## 2018-05-28 MED ORDER — ISOPROTERENOL HCL 0.2 MG/ML IJ SOLN
INTRAMUSCULAR | Status: AC
  Filled 2018-05-28: qty 5

## 2018-05-28 MED ORDER — SODIUM CHLORIDE 0.9 % IV SOLN
INTRAVENOUS | Status: DC | PRN
  Administered 2018-05-28: 15 ug/min via INTRAVENOUS

## 2018-05-28 MED ORDER — LIDOCAINE HCL (CARDIAC) PF 100 MG/5ML IV SOSY
PREFILLED_SYRINGE | INTRAVENOUS | Status: DC | PRN
  Administered 2018-05-28: 80 mg via INTRAVENOUS

## 2018-05-28 MED ORDER — DEXAMETHASONE SODIUM PHOSPHATE 10 MG/ML IJ SOLN
INTRAMUSCULAR | Status: DC | PRN
  Administered 2018-05-28: 10 mg via INTRAVENOUS

## 2018-05-28 MED ORDER — HEPARIN (PORCINE) IN NACL 1000-0.9 UT/500ML-% IV SOLN
INTRAVENOUS | Status: DC | PRN
  Administered 2018-05-28 (×2): 500 mL

## 2018-05-28 MED ORDER — ONDANSETRON HCL 4 MG/2ML IJ SOLN
INTRAMUSCULAR | Status: DC | PRN
  Administered 2018-05-28: 4 mg via INTRAVENOUS

## 2018-05-28 MED ORDER — LACTATED RINGERS IV SOLN: INTRAVENOUS | Status: DC | PRN

## 2018-05-28 MED ORDER — FENTANYL CITRATE (PF) 100 MCG/2ML IJ SOLN
INTRAMUSCULAR | Status: DC | PRN
  Administered 2018-05-28: 100 ug via INTRAVENOUS

## 2018-05-28 MED ORDER — HYDROCODONE-ACETAMINOPHEN 5-325 MG PO TABS: 1.0000 | ORAL_TABLET | ORAL | Status: DC | PRN

## 2018-05-28 MED ORDER — PROTAMINE SULFATE 10 MG/ML IV SOLN
INTRAVENOUS | Status: DC | PRN
  Administered 2018-05-28 (×4): 10 mg via INTRAVENOUS

## 2018-05-28 MED ORDER — HEPARIN SODIUM (PORCINE) 1000 UNIT/ML IJ SOLN
INTRAMUSCULAR | Status: AC
  Filled 2018-05-28: qty 2

## 2018-05-28 MED ORDER — ABACAVIR-DOLUTEGRAVIR-LAMIVUD 600-50-300 MG PO TABS
1.0000 | ORAL_TABLET | Freq: Every evening | ORAL | Status: DC
  Administered 2018-05-28: 1 via ORAL
  Filled 2018-05-28: qty 1

## 2018-05-28 MED ORDER — SODIUM CHLORIDE 0.9% FLUSH
3.0000 mL | Freq: Two times a day (BID) | INTRAVENOUS | Status: DC
  Administered 2018-05-28: 3 mL via INTRAVENOUS

## 2018-05-28 MED ORDER — ISOPROTERENOL HCL 0.2 MG/ML IJ SOLN
INTRAVENOUS | Status: DC | PRN
  Administered 2018-05-28: 20 ug/min via INTRAVENOUS

## 2018-05-28 MED ORDER — MIDAZOLAM HCL 5 MG/5ML IJ SOLN
INTRAMUSCULAR | Status: DC | PRN
  Administered 2018-05-28: 2 mg via INTRAVENOUS

## 2018-05-28 MED ORDER — SODIUM CHLORIDE 0.9 % IV SOLN: 250.0000 mL | INTRAVENOUS | Status: DC | PRN

## 2018-05-28 MED ORDER — ROCURONIUM BROMIDE 10 MG/ML (PF) SYRINGE
PREFILLED_SYRINGE | INTRAVENOUS | Status: DC | PRN
  Administered 2018-05-28: 50 mg via INTRAVENOUS
  Administered 2018-05-28 (×4): 10 mg via INTRAVENOUS

## 2018-05-28 MED ORDER — BUPIVACAINE HCL (PF) 0.25 % IJ SOLN
INTRAMUSCULAR | Status: DC | PRN
  Administered 2018-05-28: 30 mL

## 2018-05-28 MED ORDER — SUGAMMADEX SODIUM 200 MG/2ML IV SOLN
INTRAVENOUS | Status: DC | PRN
  Administered 2018-05-28: 200 mg via INTRAVENOUS

## 2018-05-28 MED ORDER — ACETAMINOPHEN 325 MG PO TABS: 650.0000 mg | ORAL_TABLET | ORAL | Status: DC | PRN

## 2018-05-28 MED ORDER — OFF THE BEAT BOOK
Freq: Once | Status: AC
  Administered 2018-05-28: 17:00:00
  Filled 2018-05-28: qty 1

## 2018-05-28 MED ORDER — PROPOFOL 10 MG/ML IV BOLUS
INTRAVENOUS | Status: DC | PRN
  Administered 2018-05-28: 200 mg via INTRAVENOUS

## 2018-05-28 MED ORDER — APIXABAN 5 MG PO TABS
5.0000 mg | ORAL_TABLET | Freq: Two times a day (BID) | ORAL | Status: DC
  Administered 2018-05-28 – 2018-05-29 (×2): 5 mg via ORAL
  Filled 2018-05-28 (×2): qty 1

## 2018-05-28 MED ORDER — PHENYLEPHRINE 40 MCG/ML (10ML) SYRINGE FOR IV PUSH (FOR BLOOD PRESSURE SUPPORT)
PREFILLED_SYRINGE | INTRAVENOUS | Status: DC | PRN
  Administered 2018-05-28: 40 ug via INTRAVENOUS
  Administered 2018-05-28 (×2): 80 ug via INTRAVENOUS

## 2018-05-28 MED ORDER — ONDANSETRON HCL 4 MG/2ML IJ SOLN: 4.0000 mg | Freq: Four times a day (QID) | INTRAMUSCULAR | Status: DC | PRN

## 2018-05-28 MED ORDER — HEPARIN SODIUM (PORCINE) 1000 UNIT/ML IJ SOLN
INTRAMUSCULAR | Status: DC | PRN
  Administered 2018-05-28: 5000 [IU] via INTRAVENOUS
  Administered 2018-05-28: 6000 [IU] via INTRAVENOUS
  Administered 2018-05-28: 5000 [IU] via INTRAVENOUS

## 2018-05-28 MED ORDER — HEPARIN SODIUM (PORCINE) 1000 UNIT/ML IJ SOLN
INTRAMUSCULAR | Status: DC | PRN
  Administered 2018-05-28: 12000 [IU] via INTRAVENOUS
  Administered 2018-05-28 (×3): 1000 [IU] via INTRAVENOUS

## 2018-05-28 MED ORDER — BUPIVACAINE HCL (PF) 0.25 % IJ SOLN
INTRAMUSCULAR | Status: AC
  Filled 2018-05-28: qty 30

## 2018-05-28 MED ORDER — SODIUM CHLORIDE 0.9 % IV SOLN
INTRAVENOUS | Status: DC
  Administered 2018-05-28 (×2): via INTRAVENOUS

## 2018-05-28 MED ORDER — SODIUM CHLORIDE 0.9% FLUSH: 3.0000 mL | INTRAVENOUS | Status: DC | PRN

## 2018-05-28 SURGICAL SUPPLY — 18 items
BLANKET WARM UNDERBOD FULL ACC (MISCELLANEOUS) ×1 IMPLANT
CATH MAPPNG PENTARAY F 2-6-2MM (CATHETERS) IMPLANT
CATH NAVISTAR SMARTTOUCH DF (ABLATOR) ×1 IMPLANT
CATH SOUNDSTAR 3D IMAGING (CATHETERS) ×1 IMPLANT
CATH WEBSTER BI DIR CS D-F CRV (CATHETERS) ×1 IMPLANT
COVER SWIFTLINK CONNECTOR (BAG) ×1 IMPLANT
NDL BAYLIS TRANSSEPTAL 71CM (NEEDLE) IMPLANT
NEEDLE BAYLIS TRANSSEPTAL 71CM (NEEDLE) ×2 IMPLANT
PACK EP LATEX FREE (CUSTOM PROCEDURE TRAY) ×2
PACK EP LF (CUSTOM PROCEDURE TRAY) ×1 IMPLANT
PAD PRO RADIOLUCENT 2001M-C (PAD) ×2 IMPLANT
PATCH CARTO3 (PAD) ×1 IMPLANT
PENTARAY F 2-6-2MM (CATHETERS) ×2
SHEATH AVANTI 11F 11CM (SHEATH) ×1 IMPLANT
SHEATH PINNACLE 7F 10CM (SHEATH) ×2 IMPLANT
SHEATH PINNACLE 9F 10CM (SHEATH) ×1 IMPLANT
SHEATH SWARTZ TS SL2 63CM 8.5F (SHEATH) ×1 IMPLANT
TUBING SMART ABLATE COOLFLOW (TUBING) ×1 IMPLANT

## 2018-05-28 NOTE — Anesthesia Procedure Notes (Signed)
Procedure Name: Intubation Date/Time: 05/28/2018 11:17 AM Performed by: Carney Living, CRNA Pre-anesthesia Checklist: Patient identified, Emergency Drugs available, Suction available, Patient being monitored and Timeout performed Patient Re-evaluated:Patient Re-evaluated prior to induction Oxygen Delivery Method: Circle system utilized Preoxygenation: Pre-oxygenation with 100% oxygen Induction Type: IV induction Ventilation: Mask ventilation without difficulty and Oral airway inserted - appropriate to patient size Laryngoscope Size: Mac and 4 Grade View: Grade II Tube type: Oral Tube size: 7.5 mm Number of attempts: 1 Airway Equipment and Method: Stylet Placement Confirmation: ETT inserted through vocal cords under direct vision,  positive ETCO2 and breath sounds checked- equal and bilateral Secured at: 23 cm Tube secured with: Tape Dental Injury: Teeth and Oropharynx as per pre-operative assessment

## 2018-05-28 NOTE — Anesthesia Postprocedure Evaluation (Signed)
Anesthesia Post Note  Patient: DEWARD SEBEK  Procedure(s) Performed: ATRIAL FIBRILLATION ABLATION (N/A )     Patient location during evaluation: PACU Anesthesia Type: General Level of consciousness: awake and alert Pain management: pain level controlled Vital Signs Assessment: post-procedure vital signs reviewed and stable Respiratory status: spontaneous breathing, nonlabored ventilation and respiratory function stable Cardiovascular status: blood pressure returned to baseline and stable Postop Assessment: no apparent nausea or vomiting Anesthetic complications: no    Last Vitals:  Vitals:   05/28/18 1410 05/28/18 1415  BP: 124/84 130/83  Pulse: 76 79  Resp: 18 16  Temp:    SpO2: 92% 92%    Last Pain:  Vitals:   05/28/18 1357  TempSrc:   PainSc: 0-No pain                 Brennan Bailey

## 2018-05-28 NOTE — Discharge Instructions (Signed)
Post procedure care instructions No driving for 4 days. No lifting over 5 lbs for 1 week. No vigorous or sexual activity for 1 week. You may return to work on 06/04/18. Keep procedure site clean & dry. If you notice increased pain, swelling, bleeding or pus, call/return!  You may shower, but no soaking baths/hot tubs/pools for 1 week.     You have an appointment set up with the [facility].  Multiple studies have shown that being followed by a dedicated atrial fibrillation clinic in addition to the standard care you receive from your other physicians improves health. We believe that enrollment in the atrial fibrillation clinic will allow [place] to better care for you.   The phone number to the [facility] is [phone]. The clinic is staffed Monday through Friday from 8:30am to 5pm.  Parking Directions: The clinic is located in the Heart and Vascular Building connected to [facility]. 1)From [address] turn on to [address] and go to the 3rd entrance  (Heart and Vascular entrance) on the right. 2)Look to the right for Heart &Vascular Parking Garage. 3)A code for the entrance is required please call the clinic to receive this.   4)Take the elevators to the 1st floor. Registration is in the room with the glass walls at the end of the hallway.  If you have any trouble parking or locating the clinic, please dont hesitate to call [phone].

## 2018-05-28 NOTE — Interval H&P Note (Signed)
History and Physical Interval Note:  05/28/2018 10:50 AM  Ethan Lambert  has presented today for surgery, with the diagnosis of a-fib  The various methods of treatment have been discussed with the patient and family. After consideration of risks, benefits and other options for treatment, the patient has consented to  Procedure(s): ATRIAL FIBRILLATION ABLATION (N/A) as a surgical intervention .  The patient's history has been reviewed, patient examined, no change in status, stable for surgery.  I have reviewed the patient's chart and labs.  Questions were answered to the patient's satisfaction.    He reports compliance with eliquis without interruption. Cardiac CT reviewed with the patient today.  Thompson Grayer MD, [place] 05/28/2018 10:51 AM

## 2018-05-28 NOTE — Discharge Summary (Addendum)
ELECTROPHYSIOLOGY PROCEDURE DISCHARGE SUMMARY    Patient ID: Ethan Lambert,  MRN: [id number], DOB/AGE: 08/20/1960 57 y.o.  Admit date: 05/28/2018 Discharge date:05/29/18  Primary Care Physician: Patient, No Pcp Per  Electrophysiologist: Thompson Grayer, MD  Primary Discharge Diagnosis:  1. Paroxysmal AFib     CHA2DS2Vasc is 3, on Eliquis, appropriately dosed  Secondary Discharge Diagnosis:  1. HIV 2. HTN 3. OSA 4. CVA (old)  Procedures This Admission:  1.  Electrophysiology study and radiofrequency catheter ablation on 05/28/18 by Dr Thompson Grayer.   This study demonstrated      Brief HPI: Ethan Lambert is a 57 y.o. male with a history of paroxysmal atrial fibrillation.  He failed medical therapy with flecainide. Risks, benefits, and alternatives to catheter ablation of atrial fibrillation were reviewed with the patient who wished to proceed.  The patient underwent cardiac CT prior to the procedure which demonstrated no LAA thrombus.    Hospital Course:  The patient was admitted and underwent EPS/RFCA of atrial fibrillation with details as outlined above.  The patient was monitored on telemetry overnight which demonstrated SR.  R groin was without complication on the day of discharge.  The patient feels well this morning, no CP or SOB, he was examined by Dr. Rayann Heman and considered to be stable for discharge.  Wound care and restrictions were reviewed with the patient.  The patient will be seen at the Afib clinic in 4 weeks and Dr Rayann Heman in 12 weeks for post ablation follow up.   Physical Exam: Vitals:   05/28/18 2000 05/29/18 0450 05/29/18 0455 05/29/18 0710  BP:  132/84  125/84  Pulse: 82 69  80  Resp: 17 17 (!) 23 14  Temp:  98.3 F (36.8 C)  98.2 F (36.8 C)  TempSrc:  Oral  Oral  SpO2: 94% 95%  94%  Weight:   121.3 kg   Height:        GEN- The patient is well appearing, alert and oriented x 3 today.   HEENT: normocephalic, atraumatic; sclera clear, conjunctiva  pink; hearing intact; oropharynx clear; neck supple  Lungs- CTA b/l, normal work of breathing.  No wheezes, rales, rhonchi Heart- RRR, no murmurs, rubs or gallops  GI- soft, non-tender, non-distended Extremities- no clubbing, cyanosis, or edema; DP/PT 2+ bilaterally, R groin without hematoma/bruit MS- no significant deformity or atrophy Skin- warm and dry, no rash or lesion Psych- euthymic mood, full affect Neuro- strength and sensation are intact   Labs:   Lab Results  Component Value Date   WBC 7.5 05/11/2018   HGB 15.2 05/11/2018   HCT 43.9 05/11/2018   MCV 88 05/11/2018   PLT 276 05/11/2018   No results for input(s): NA, K, CL, CO2, BUN, CREATININE, CALCIUM, PROT, BILITOT, ALKPHOS, ALT, AST, GLUCOSE in the last 168 hours.  Invalid input(s): LABALBU   Discharge Medications:  Allergies as of 05/29/2018      Reactions   Morphine Other (See Comments)   hallucinations      Medication List    TAKE these medications   diltiazem 240 MG 24 hr capsule Commonly known as:  CARDIZEM CD Take 1 capsule (240 mg total) by mouth daily. What changed:  when to take this   ELIQUIS 5 MG Tabs tablet Generic drug:  apixaban TAKE 1 TABLET BY MOUTH  TWICE A DAY What changed:  how much to take   flecainide 100 MG tablet Commonly known as:  TAMBOCOR Take 1 tablet (100 mg  total) by mouth 2 (two) times daily.   metoprolol tartrate 100 MG tablet Commonly known as:  LOPRESSOR Take 1 tablet (100 mg total) by mouth once for 1 dose. Take one tablet 2 hours prior to ct   pantoprazole 40 MG tablet Commonly known as:  PROTONIX Take 1 tablet (40 mg total) by mouth daily.   TRIUMEQ [id number] MG tablet Generic drug:  abacavir-dolutegravir-lamiVUDine TAKE 1 TABLET BY MOUTH  EVERY DAY What changed:  when to take this       Disposition:   Home Discharge Instructions    Diet - low sodium heart healthy   Complete by:  As directed    Increase activity slowly   Complete by:  As directed       Follow-up Information    [facility].   Specialty:  Cardiology Why:  06/22/18 @ 9:00AM Contact information: [address] [id number] [place] [address] [phone]       Thompson Grayer, MD Follow up.   Specialty:  Cardiology Why:  09/09/18 @ 9:45PM Contact information: [place] [address] [phone]           Duration of Discharge Encounter: Greater than 30 minutes including physician time.  Signed, Tommye Standard, PA-C 05/29/2018 8:21 AM  I have seen, examined the patient, and reviewed the above assessment and plan.  Changes to above are made where necessary.  On exam, RRR.  Doing well s/p ablation.  DC to home with routine follow-up.  Co Sign: Thompson Grayer, MD 05/29/2018 11:09 PM

## 2018-05-28 NOTE — Progress Notes (Signed)
Site area: Right groin a 7,9, and 11 french venous sheaths were removed  Site Prior to Removal:  Level 0  Pressure Applied For 20 MINUTES    Bedrest Beginning at 1500p  Manual:   Yes.    Patient Status During Pull:  stable  Post Pull Groin Site:  Level 0  Post Pull Instructions Given:  Yes.    Post Pull Pulses Present:  Yes.    Dressing Applied:  Yes.    Comments:  VS remain stable

## 2018-05-28 NOTE — Transfer of Care (Signed)
Immediate Anesthesia Transfer of Care Note  Patient: Ethan Lambert  Procedure(s) Performed: ATRIAL FIBRILLATION ABLATION (N/A )  Patient Location: Cath Lab  Anesthesia Type:General  Level of Consciousness: awake, alert , oriented and patient cooperative  Airway & Oxygen Therapy: Patient Spontanous Breathing and Patient connected to nasal cannula oxygen  Post-op Assessment: Report given to RN, Post -op Vital signs reviewed and stable and Patient moving all extremities X 4  Post vital signs: Reviewed and stable  Last Vitals:  Vitals Value Taken Time  BP 131/81 05/28/2018  2:05 PM  Temp 36.3 C 05/28/2018  2:00 PM  Pulse 78 05/28/2018  2:06 PM  Resp 17 05/28/2018  2:06 PM  SpO2 92 % 05/28/2018  2:06 PM  Vitals shown include unvalidated device data.  Last Pain:  Vitals:   05/28/18 1357  TempSrc:   PainSc: 0-No pain      Patients Stated Pain Goal: 3 (46/50/35 4656)  Complications: No apparent anesthesia complications

## 2018-05-29 ENCOUNTER — Encounter (HOSPITAL_COMMUNITY): Payer: Self-pay | Admitting: Internal Medicine

## 2018-05-29 DIAGNOSIS — I10 Essential (primary) hypertension: Secondary | ICD-10-CM | POA: Diagnosis not present

## 2018-05-29 DIAGNOSIS — I480 Paroxysmal atrial fibrillation: Secondary | ICD-10-CM

## 2018-05-29 DIAGNOSIS — E669 Obesity, unspecified: Secondary | ICD-10-CM | POA: Diagnosis not present

## 2018-05-29 DIAGNOSIS — B20 Human immunodeficiency virus [HIV] disease: Secondary | ICD-10-CM | POA: Diagnosis not present

## 2018-05-29 LAB — POCT ACTIVATED CLOTTING TIME
Activated Clotting Time: 219 seconds
Activated Clotting Time: 246 seconds
Activated Clotting Time: 257 seconds

## 2018-05-29 MED ORDER — PANTOPRAZOLE SODIUM 40 MG PO TBEC: 40.0000 mg | DELAYED_RELEASE_TABLET | Freq: Every day | ORAL | 0 refills | Status: DC

## 2018-06-08 ENCOUNTER — Ambulatory Visit: Payer: Self-pay | Admitting: Internal Medicine

## 2018-06-15 ENCOUNTER — Other Ambulatory Visit (HOSPITAL_COMMUNITY): Payer: Self-pay | Admitting: Physician Assistant

## 2018-06-15 NOTE — Telephone Encounter (Signed)
Patient pharmacy is requesting refill on pantoprazole. Would Dr. Rayann Heman like to refill this medication?

## 2018-06-22 ENCOUNTER — Ambulatory Visit (HOSPITAL_COMMUNITY)
Admission: RE | Admit: 2018-06-22 | Discharge: 2018-06-22 | Disposition: A | Discharge status: Home / Self Care | Payer: 59 | Source: Ambulatory Visit | Attending: Nurse Practitioner | Admitting: Nurse Practitioner

## 2018-06-22 ENCOUNTER — Encounter (HOSPITAL_COMMUNITY): Payer: Self-pay | Admitting: Nurse Practitioner

## 2018-06-22 ENCOUNTER — Other Ambulatory Visit: Payer: Self-pay | Admitting: Physician Assistant

## 2018-06-22 VITALS — BP 140/82 | HR 65 | Ht 73.0 in | Wt 271.0 lb

## 2018-06-22 DIAGNOSIS — Z801 Family history of malignant neoplasm of trachea, bronchus and lung: Secondary | ICD-10-CM | POA: Insufficient documentation

## 2018-06-22 DIAGNOSIS — Z9889 Other specified postprocedural states: Secondary | ICD-10-CM | POA: Diagnosis not present

## 2018-06-22 DIAGNOSIS — R635 Abnormal weight gain: Secondary | ICD-10-CM | POA: Diagnosis not present

## 2018-06-22 DIAGNOSIS — Z8249 Family history of ischemic heart disease and other diseases of the circulatory system: Secondary | ICD-10-CM | POA: Insufficient documentation

## 2018-06-22 DIAGNOSIS — Z79899 Other long term (current) drug therapy: Secondary | ICD-10-CM | POA: Diagnosis not present

## 2018-06-22 DIAGNOSIS — B20 Human immunodeficiency virus [HIV] disease: Secondary | ICD-10-CM | POA: Diagnosis not present

## 2018-06-22 DIAGNOSIS — I10 Essential (primary) hypertension: Secondary | ICD-10-CM | POA: Insufficient documentation

## 2018-06-22 DIAGNOSIS — I480 Paroxysmal atrial fibrillation: Secondary | ICD-10-CM | POA: Insufficient documentation

## 2018-06-22 MED ORDER — METOPROLOL SUCCINATE ER 25 MG PO TB24: 12.5000 mg | ORAL_TABLET | Freq: Every day | ORAL | 3 refills | Status: DC

## 2018-06-22 MED ORDER — PANTOPRAZOLE SODIUM 40 MG PO TBEC: 40.0000 mg | DELAYED_RELEASE_TABLET | Freq: Every day | ORAL | 3 refills | Status: DC

## 2018-06-22 NOTE — Patient Instructions (Signed)
Metoprolol 1/2 tablet once a day
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
## 2018-06-30 ENCOUNTER — Encounter (INDEPENDENT_AMBULATORY_CARE_PROVIDER_SITE_OTHER): Payer: Self-pay | Admitting: Orthopaedic Surgery

## 2018-06-30 ENCOUNTER — Ambulatory Visit (INDEPENDENT_AMBULATORY_CARE_PROVIDER_SITE_OTHER): Payer: 59 | Admitting: Orthopaedic Surgery

## 2018-06-30 DIAGNOSIS — M25512 Pain in left shoulder: Secondary | ICD-10-CM

## 2018-06-30 DIAGNOSIS — M25511 Pain in right shoulder: Secondary | ICD-10-CM

## 2018-06-30 DIAGNOSIS — G8929 Other chronic pain: Secondary | ICD-10-CM
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
## 2018-07-01 ENCOUNTER — Ambulatory Visit
Admission: RE | Admit: 2018-07-01 | Discharge: 2018-07-01 | Disposition: A | Discharge status: Home / Self Care | Payer: 59 | Source: Ambulatory Visit | Attending: Orthopaedic Surgery | Admitting: Orthopaedic Surgery

## 2018-07-01 DIAGNOSIS — G8929 Other chronic pain: Secondary | ICD-10-CM

## 2018-07-01 DIAGNOSIS — M25511 Pain in right shoulder: Principal | ICD-10-CM

## 2018-07-10 ENCOUNTER — Ambulatory Visit (INDEPENDENT_AMBULATORY_CARE_PROVIDER_SITE_OTHER): Payer: 59 | Admitting: Orthopaedic Surgery

## 2018-07-10 ENCOUNTER — Encounter (INDEPENDENT_AMBULATORY_CARE_PROVIDER_SITE_OTHER): Payer: Self-pay | Admitting: Orthopaedic Surgery

## 2018-07-10 DIAGNOSIS — M75102 Unspecified rotator cuff tear or rupture of left shoulder, not specified as traumatic: Secondary | ICD-10-CM

## 2018-07-10 NOTE — Progress Notes (Signed)
Office Visit Note   Patient: Ethan Lambert           Date of Birth: 26-Jul-1960           MRN: [id number] Visit Date: 07/10/2018              Requested by: No referring provider defined for this encounter. PCP: Patient, No Pcp Per   Assessment & Plan: Visit Diagnoses:  1. Rotator cuff syndrome of left shoulder     Plan: MRI findings are consistent with tendinosis of the supraspinatus and infraspinatus without any tearing.  He has some degenerative findings of the [facility] joint and the glenohumeral joint.  Overall these findings should resolve with conservative treatment.  At this point he feels that he can return back to work without restrictions.  We discussed that the patient can let us know if he feels that he is not able to perform this will duty.  I will see him back as needed. Total face to face encounter time was greater than 25 minutes and over half of this time was spent in counseling and/or coordination of care.  Follow-Up Instructions: Return if symptoms worsen or fail to improve.   Orders:  No orders of the defined types were placed in this encounter.  No orders of the defined types were placed in this encounter.     Procedures: No procedures performed   Clinical Data: No additional findings.   Subjective: Chief Complaint  Patient presents with  . Left Shoulder - Pain    Ethan Lambert returns today for MRI review.  He states that most the time his left shoulder does not bother him it is mainly when he has to drive or raise his arm for a sustained period of time that it starts to bother him.  He does have some trouble sleeping at night also.   Review of Systems   Objective: Vital Signs: There were no vitals taken for this visit.  Physical Exam  Ortho Exam Left shoulder exam shows normal strength to manual muscle testing.  Normal range of motion. Specialty Comments:  No specialty comments available.  Imaging: No results found.   PMFS History: Patient  Active Problem List   Diagnosis Date Noted  . Rotator cuff syndrome of left shoulder 07/10/2018  . Paroxysmal atrial fibrillation ([place]) 05/28/2018  . Subacromial bursitis of left shoulder joint 05/19/2018  . Screening examination for sexually transmitted disease 05/11/2018  . Need for prophylactic vaccination against Streptococcus pneumoniae (pneumococcus) 05/11/2018  . Medication monitoring encounter 02/12/2017  . Sprain of shoulder, right 10/01/2016  . Shoulder pain, right 09/26/2016  . Acromioclavicular joint arthritis 09/26/2016  . Encounter for long-term (current) use of medications 06/08/2015  . HNP (herniated nucleus pulposus), lumbar 03/15/2015  . Fatigue 01/26/2015  . S/P right knee arthroscopy 12/31/2013  . INGUINAL HERNIORRHAPHIES, BILATERAL, HX OF 01/15/2010  . VARICOCELE 07/05/2009  . HEMATOSPERMIA 06/27/2009  . Essential hypertension 12/08/2008  . Atrial fibrillation ([place]) 12/08/2008  . Cerebral artery occlusion with cerebral infarction ([place]) 12/08/2008  . Human immunodeficiency virus disease ([place]) 11/18/2006   Past Medical History:  Diagnosis Date  . Anemia   . Arthritis    "probably right ring finger joint" (05/28/2018)  . Childhood asthma   . CVA (cerebral vascular accident) ([place]) 1999 X 2   LEFT FRONTAL & OCCIPITAL , NON-HEMORRHAGIC; no residual  . Family history of malignant neoplasm of gastrointestinal tract   . Heart murmur   . History of blood transfusion 1999   "  related to the stroke"  . History of kidney stones   . HIV disease ([place]) dx 1999   MONITORED BY INFECTIOUS DISEASE -- DR Gilgo  . Hypertension   . Paroxysmal atrial fibrillation (HCC)    CARDIOLOGIST--  DR Rayann Heman  . Right knee meniscal tear   . Skin cancer    skin  . Sleep apnea    "didn't follow thru w/mask" (05/28/2018)    Family History  Problem Relation Age of Onset  . Arrhythmia Father   . Heart attack Brother   . Prostate cancer Maternal Grandfather   . Colon cancer  Maternal Grandfather   . Lung cancer Mother        smoker  . Hypertension Mother   . Hyperlipidemia Mother   . Other Maternal Grandmother        harding of the arteries    Past Surgical History:  Procedure Laterality Date  . APPENDECTOMY  06-09-2003  . ATRIAL FIBRILLATION ABLATION  05/28/2018  . ATRIAL FIBRILLATION ABLATION N/A 05/28/2018   Procedure: ATRIAL FIBRILLATION ABLATION;  Surgeon: Thompson Grayer, MD;  Location: [facility];  Service: Cardiovascular;  Laterality: N/A;  . BACK SURGERY    . INGUINAL HERNIA REPAIR Bilateral 1990s  . KNEE ARTHROSCOPY WITH MEDIAL MENISECTOMY Right 12/31/2013   Procedure: RIGHT KNEE ARTHROSCOPY PARTIAL MEDIAL MENISCECTOMY DEBRIDEMENT AND CHONDROPLASTY ;  Surgeon: Sydnee Cabal, MD;  Location: [facility];  Service: Orthopedics;  Laterality: Right;  . LAPAROSCOPIC CHOLECYSTECTOMY  07-08-2003  . LASER ABLATION ANAL CONDYLOMA  05-18-2001  . LUMBAR LAMINECTOMY/DECOMPRESSION MICRODISCECTOMY Left 03/15/2015   Procedure: Left Lumbar five- sacral one microdiskectomy;  Surgeon: Consuella Lose, MD;  Location: [facility];  Service: Neurosurgery;  Laterality: Left;  Left L5S1 microdiskectomy  . NEGATIVE SLEEP STUDY  01-03-2012  in epic  . SKIN CANCER EXCISION Right    "cut it off my shoulder"  . TRANSTHORACIC ECHOCARDIOGRAM  12-01-2008   MODERATE LVH/  EF 55-60%/  MILD MR/  NEGATIVE BUBBLE STUDY   Social History   Occupational History  . 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 Smoking status: Never Smoker  . Smokeless tobacco: Never Used  Substance and Sexual Activity  . Alcohol use: Yes    Alcohol/week: 0.0 standard drinks    Comment: 05/28/2018 "rarely; 1-2 per month"  . Drug use: Never  . Sexual activity: Yes    Partners: Male

## 2018-07-18 ENCOUNTER — Other Ambulatory Visit: Payer: Self-pay | Admitting: Internal Medicine

## 2018-07-30 ENCOUNTER — Other Ambulatory Visit: Payer: Self-pay | Admitting: Internal Medicine

## 2018-07-30 MED ORDER — FLECAINIDE ACETATE 100 MG PO TABS: 100.0000 mg | ORAL_TABLET | Freq: Two times a day (BID) | ORAL | 3 refills | Status: DC

## 2018-08-13 ENCOUNTER — Encounter: Payer: Self-pay | Admitting: Internal Medicine

## 2018-08-19 ENCOUNTER — Telehealth (INDEPENDENT_AMBULATORY_CARE_PROVIDER_SITE_OTHER): Payer: Self-pay | Admitting: Orthopaedic Surgery

## 2018-08-19 NOTE — Telephone Encounter (Signed)
Received a call from [facility] @ [facility] stating she had received records but was missing the MRI report. I faxed to her at [phone]

## 2018-09-09 ENCOUNTER — Ambulatory Visit: Payer: Self-pay | Admitting: Internal Medicine

## 2018-10-08 ENCOUNTER — Other Ambulatory Visit: Payer: Self-pay

## 2018-10-08 ENCOUNTER — Telehealth (INDEPENDENT_AMBULATORY_CARE_PROVIDER_SITE_OTHER): Payer: 59 | Admitting: Family Medicine

## 2018-10-08 DIAGNOSIS — M25562 Pain in left knee: Secondary | ICD-10-CM

## 2018-10-08 NOTE — Progress Notes (Signed)
Telemedicine Encounter- SOAP NOTE Established Patient  I discussed the limitations, risks, security and privacy concerns of performing an evaluation and management service by telephone and the availability of in person appointments. I also discussed with the patient that there may be a patient responsible charge related to this service. The patient expressed understanding and agreed to proceed.  This telephone encounter was conducted with the patient's  verbal consent via audio telecommunications: yes Patient was instructed to have this encounter in a suitably private space; and to only have persons present to whom they give permission to participate. In addition, patient identity was confirmed by use of name plus two identifiers (DOB and address).  I spent a total of 53min talking with the patient or their proxy.  left knee pain-started last Sat  Subjective   Ethan Lambert is a 58 y.o. male established patient. Telephone visit today for knee pain  HPI Pt reports burning sensation in outer thigh noted last Sat on the left side of the leg-completely resolved by Sunday.  Sunday pt had difficulty walking due to pain in the left thigh, knee and calf. Monday-pain in the left leg caused difficulty standing and  the left lower leg pain with walking.  No pain with sitting. Pt is now wearing a brace-otc-soft black support-around knee. Pt states improved symptoms after rest on Tuesday better with ability to walking with no assistance. While at work -left knee pain started worse going down the stairs becoming unbearable. Walking was painful but not as bad as stair climbing.  Pt states knee pain only currently with no pain in the calf, thigh. Pain Scale 10/10 with stairs. pt with a torn mensicus on the right several years ago with repair after a work injury. Pt states left  knee currently feels unstable-similar to the right in the past.. Pt states lateral pain where pain now located shows-no redness or   swelling. Pt states ice was placed on the  knee on Tuesday intermittently. Going  downstairs continues to be painful 10/10.   Right knee surgery 3 years ago due to an on the job injury. pt twisted foot causing knee injury due to a meniscal tear.  Current injury with no relationship with work. Pt states last Thursday he was riding his tractor that has a clutching driving with left foot but otherwise no different activities.No ice yesterday-pt sat at shop having brakes -used can to assist with walking and resting.  Wore brace on his knee. Pt took tylenol. Pt not suppose to take ibuprofen due to afib-cardiology appt follow-post ablation and doing well. Pt states heart rate normal-no longer in a fib. Pt states several days since any symptoms. Pt states flecainide twice a day to maintain rate.   Patient Active Problem List   Diagnosis Date Noted  . Rotator cuff syndrome of left shoulder 07/10/2018  . Paroxysmal atrial fibrillation ([place]) 05/28/2018  . Subacromial bursitis of left shoulder joint 05/19/2018  . Screening examination for sexually transmitted disease 05/11/2018  . Need for prophylactic vaccination against Streptococcus pneumoniae (pneumococcus) 05/11/2018  . Medication monitoring encounter 02/12/2017  . Sprain of shoulder, right 10/01/2016  . Shoulder pain, right 09/26/2016  . Acromioclavicular joint arthritis 09/26/2016  . Encounter for long-term (current) use of medications 06/08/2015  . HNP (herniated nucleus pulposus), lumbar 03/15/2015  . Fatigue 01/26/2015  . S/P right knee arthroscopy 12/31/2013  . INGUINAL HERNIORRHAPHIES, BILATERAL, HX OF 01/15/2010  . VARICOCELE 07/05/2009  . HEMATOSPERMIA 06/27/2009  . Essential hypertension  12/08/2008  . Atrial fibrillation ([place]) 12/08/2008  . Cerebral artery occlusion with cerebral infarction ([place]) 12/08/2008  . Human immunodeficiency virus disease ([place]) 11/18/2006    Past Medical History:  Diagnosis Date  . Anemia   . Arthritis     "probably right ring finger joint" (05/28/2018)  . Childhood asthma   . CVA (cerebral vascular accident) ([place]) 1999 X 2   LEFT FRONTAL & OCCIPITAL , NON-HEMORRHAGIC; no residual  . Family history of malignant neoplasm of gastrointestinal tract   . Heart murmur   . History of blood transfusion 1999   "related to the stroke"  . History of kidney stones   . HIV disease ([place]) dx 1999   MONITORED BY INFECTIOUS DISEASE -- DR [place]  . Hypertension   . Paroxysmal atrial fibrillation (H[facility])    CARDIOLOGIST--  DR Rayann Heman  . Right knee meniscal tear   . Skin cancer    skin  . Sleep apnea    "didn't follow thru w/mask" (05/28/2018)    Current Outpatient Medications  Medication Sig Dispense Refill  . diltiazem (CARDIZEM CD) 240 MG 24 hr capsule TAKE 1 CAPSULE BY MOUTH  DAILY 90 capsule 2  . ELIQUIS 5 MG TABS tablet TAKE 1 TABLET BY MOUTH  TWICE A DAY (Patient taking differently: Take 5 mg by mouth 2 (two) times daily. ) 180 tablet 0  . flecainide (TAMBOCOR) 100 MG tablet Take 1 tablet (100 mg total) by mouth 2 (two) times daily. 180 tablet 3  . metoprolol succinate (TOPROL XL) 25 MG 24 hr tablet Take 0.5 tablets (12.5 mg total) by mouth daily. 30 tablet 3  . TRIUMEQ 600-50-300 MG tablet TAKE 1 TABLET BY MOUTH  EVERY DAY (Patient taking differently: Take 1 tablet by mouth every evening. ) 30 tablet 5  . pantoprazole (PROTONIX) 40 MG tablet Take 1 tablet (40 mg total) by mouth daily. (Patient not taking: Reported on 10/08/2018) 45 tablet 3   No current facility-administered medications for this visit.     Allergies  Allergen Reactions  . Morphine Other (See Comments)    hallucinations    Social History   Socioeconomic History  . Marital status: Married    Spouse name: Not on file  . Number of children: 1  . Years of education: Not on file  . Highest education level: Not on file  Occupational History  . Occupation: [employer] Armed forces operational officer: [employer]  Social Needs   . Financial resource strain: Not on file  . Food insecurity:    Worry: Not on file    Inability: Not on file  . Transportation needs:    Medical: Not on file    Non-medical: Not on file  Tobacco Use  . Smoking status: Never Smoker  . Smokeless tobacco: Never Used  Substance and Sexual Activity  . Alcohol use: Yes    Alcohol/week: 0.0 standard drinks    Comment: 05/28/2018 "rarely; 1-2 per month"  . Drug use: Never  . Sexual activity: Yes    Partners: Male  Lifestyle  . Physical activity:    Days per week: Not on file    Minutes per session: Not on file  . Stress: Not on file  Relationships  . Social connections:    Talks on phone: Not on file    Gets together: Not on file    Attends religious service: Not on file    Active member of club or organization: Not on file  Attends meetings of clubs or organizations: Not on file    Relationship status: Not on file  . Intimate partner violence:    Fear of current or ex partner: Not on file    Emotionally abused: Not on file    Physically abused: Not on file    Forced sexual activity: Not on file  Other Topics Concern  . Not on file  Social History Narrative   Pt lives in [place].  Unemployed.    ROS CONSTITUTIONAL: no  fever lesions CV: no chest pain, irregular heart beat-improved RESP: no SOB, no cough, SW:HQPR knee pain-lateral-no swelling, redness, FROM at rest.pain with a sensation of instability with going DOWN stairs. NO POSTERIOR PAIN. NO INJURY at home or work. PRIOR history of meniscal repair on the right   Objective   Vitals as reported by the patient:none Acute pain of left knee-rest , ice, elevation-use tylenol -max 3 g/day Avoid bending, lifting, squatting, stair climbing. Off work till 4/22-call Monday if no improvement-pt has a ortho that has followed for other ortho concerns. Pt understands to call for increase swelling, redness or other concerns-currently on blood thinner for afib. NO CALF PAIN  currently-lateral knee pain   I discussed the assessment and treatment plan with the patient. The patient was provided an opportunity to ask questions and all were answered. The patient agreed with the plan and demonstrated an understanding of the instructions.   The patient was advised to call back or seek an in-person evaluation if the symptoms worsen or if the condition fails to improve as anticipated.  I provided 40 minutes of non-face-to-face time during this encounter.   Hannah Beat, MD  Primary Care at [facility] 10-08-18

## 2018-10-08 NOTE — Progress Notes (Signed)
Left Knee pain. Started last Saturday.

## 2018-10-09 ENCOUNTER — Other Ambulatory Visit: Payer: Self-pay | Admitting: Internal Medicine

## 2018-10-09 MED ORDER — APIXABAN 5 MG PO TABS: 5.0000 mg | ORAL_TABLET | Freq: Two times a day (BID) | ORAL | 1 refills | Status: DC

## 2018-10-09 NOTE — Telephone Encounter (Signed)
Pt last saw Roderic Palau, NP on 06/12/18, last labs 05/11/18 Creat 0.99, age 58, weight 122.9kg, based on specified criteria pt is on appropriate dosage of Eliquis 5mg  BID.  Will refill rx.

## 2018-10-09 NOTE — Telephone Encounter (Signed)
Pt calling requesting a refill on Eliquis 5 mg tablet be sent to [facility] mail order pharmacy. Please address

## 2018-10-13 ENCOUNTER — Telehealth (INDEPENDENT_AMBULATORY_CARE_PROVIDER_SITE_OTHER): Payer: 59 | Admitting: Family Medicine

## 2018-10-13 ENCOUNTER — Other Ambulatory Visit: Payer: Self-pay

## 2018-10-13 DIAGNOSIS — M25562 Pain in left knee: Secondary | ICD-10-CM

## 2018-10-13 NOTE — Progress Notes (Signed)
Telemedicine Encounter- SOAP NOTE Established Patient  I discussed the limitations, risks, security and privacy concerns of performing an evaluation and management service by telephone and the availability of in person appointments. I also discussed with the patient that there may be a patient responsible charge related to this service. The patient expressed understanding and agreed to proceed.  This telephone encounter was conducted with the patient's verbal consent via audio telecommunications: yes Patient was instructed to have this encounter in a suitably private space; and to only have persons present to whom they give permission to participate. In addition, patient identity was confirmed by use of name plus two identifiers (DOB and address).  I spent a total of10 talking with the patient   Cc: left knee pain-instablity Spoke with pt this evening and he states he still is having severe left knee pain. Pt states his pain level is a 8 out of 10. Pt states there is no swelling or redness at this time.    Ethan Lambert is a 58 y.o.male established patient. Telephone visit today for continued left knee pain  HPI  Pt states he continues to have left knee pain and instability. Pt used tylenol for treatment of symptoms but continues to be concerned with walking. Pt states he can walk 2-3 steps then feels knee is not longer stable. Pt previously saw [facility] for shoulder concerns and would like to return for evaluation. Pt with right knee injury at work with repair using worker compensation in the past. This injury in NOT [place].   Patient Active Problem List   Diagnosis Date Noted  . Rotator cuff syndrome of left shoulder 07/10/2018  . Paroxysmal atrial fibrillation ([place]) 05/28/2018  . Subacromial bursitis of left shoulder joint 05/19/2018  . Screening examination for sexually transmitted disease 05/11/2018  . Need for prophylactic vaccination against Streptococcus  pneumoniae (pneumococcus) 05/11/2018  . Medication monitoring encounter 02/12/2017  . Sprain of shoulder, right 10/01/2016  . Shoulder pain, right 09/26/2016  . Acromioclavicular joint arthritis 09/26/2016  . Encounter for long-term (current) use of medications 06/08/2015  . HNP (herniated nucleus pulposus), lumbar 03/15/2015  . Fatigue 01/26/2015  . S/P right knee arthroscopy 12/31/2013  . INGUINAL HERNIORRHAPHIES, BILATERAL, HX OF 01/15/2010  . VARICOCELE 07/05/2009  . HEMATOSPERMIA 06/27/2009  . Essential hypertension 12/08/2008  . Atrial fibrillation ([place]) 12/08/2008  . Cerebral artery occlusion with cerebral infarction ([place]) 12/08/2008  . Human immunodeficiency virus disease ([place]) 11/18/2006    Past Medical History:  Diagnosis Date  . Anemia   . Arthritis    "probably right ring finger joint" (05/28/2018)  . Childhood asthma   . CVA (cerebral vascular accident) ([place]) 1999 X 2   LEFT FRONTAL & OCCIPITAL , NON-HEMORRHAGIC; no residual  . Family history of malignant neoplasm of gastrointestinal tract   . Heart murmur   . History of blood transfusion 1999   "related to the stroke"  . History of kidney stones   . HIV disease ([place]) dx 1999   MONITORED BY INFECTIOUS DISEASE -- DR [place]  . Hypertension   . Paroxysmal atrial fibrillation (HCC)    CARDIOLOGIST--  DR Rayann Heman  . Right knee meniscal tear   . Skin cancer    skin  . Sleep apnea    "didn't follow thru w/mask" (05/28/2018)    Current Outpatient Medications  Medication Sig Dispense Refill  . apixaban (ELIQUIS) 5 MG TABS tablet Take 1 tablet (5 mg total) by mouth 2 (two)  times daily. 180 tablet 1  . diltiazem (CARDIZEM CD) 240 MG 24 hr capsule TAKE 1 CAPSULE BY MOUTH  DAILY 90 capsule 2  . flecainide (TAMBOCOR) 100 MG tablet Take 1 tablet (100 mg total) by mouth 2 (two) times daily. 180 tablet 3  . metoprolol succinate (TOPROL XL) 25 MG 24 hr tablet Take 0.5 tablets (12.5 mg total) by mouth daily. 30 tablet 3   . pantoprazole (PROTONIX) 40 MG tablet Take 1 tablet (40 mg total) by mouth daily. 45 tablet 3  . TRIUMEQ 600-50-300 MG tablet TAKE 1 TABLET BY MOUTH  EVERY DAY (Patient taking differently: Take 1 tablet by mouth every evening. ) 30 tablet 5   No current facility-administered medications for this visit.     Allergies  Allergen Reactions  . Morphine Other (See Comments)    hallucinations    Social History   Socioeconomic History  . Marital status: Married    Spouse name: Not on file  . Number of children: 1  . Years of education: Not on file  . Highest education level: Not on file  Occupational History  . 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 Financial resource strain: Not on file  . Food insecurity:    Worry: Not on file    Inability: Not on file  . Transportation needs:    Medical: Not on file    Non-medical: Not on file  Tobacco Use  . Smoking status: Never Smoker  . Smokeless tobacco: Never Used  Substance and Sexual Activity  . Alcohol use: Yes    Alcohol/week: 0.0 standard drinks    Comment: 05/28/2018 "rarely; 1-2 per month"  . Drug use: Never  . Sexual activity: Yes    Partners: Male  Lifestyle  . Physical activity:    Days per week: Not on file    Minutes per session: Not on file  . Stress: Not on file  Relationships  . Social connections:    Talks on phone: Not on file    Gets together: Not on file    Attends religious service: Not on file    Active member of club or organization: Not on file    Attends meetings of clubs or organizations: Not on file    Relationship status: Not on file  . Intimate partner violence:    Fear of current or ex partner: Not on file    Emotionally abused: Not on file    Physically abused: Not on file    Forced sexual activity: Not on file  Other Topics Concern  . Not on file  Social History Narrative   Pt lives in [place].  Unemployed.    ROS CONSTITUTIONAL: no weight  loss, fever, night sweats INTEG: no skin changes NEURO:no numbness  Objective   Vitals as reported by the patient: none Diagnoses and all orders for this visit:  Acute pain of left knee -     Ambulatory referral to Orthopedic Surgery  pt using tylenol-unable to use ibuprofen due to heart concerns instability concerning-pt unable to work-work note 4/16-4/27   I discussed the assessment and treatment plan with the patient. The patient was provided an opportunity to ask questions and all were answered. The patient agreed with the plan and demonstrated an understanding of the instructions.   The patient was advised to call back or seek an in-person evaluation if the symptoms worsen or if the condition fails to improve as anticipated.  I  provided 10 minutes of non-face-to-face time during this encounter.   Hannah Beat, MD  Primary Care at [facility] 10-13-18

## 2018-10-13 NOTE — Progress Notes (Signed)
Spoke with pt this evening and he states he still is having severe left knee pain. Pt states his pain level is a 8 out of 10. Pt states there is no swelling or redness at this time.

## 2018-10-16 ENCOUNTER — Other Ambulatory Visit: Payer: Self-pay

## 2018-10-16 ENCOUNTER — Ambulatory Visit (INDEPENDENT_AMBULATORY_CARE_PROVIDER_SITE_OTHER): Payer: 59

## 2018-10-16 ENCOUNTER — Encounter (INDEPENDENT_AMBULATORY_CARE_PROVIDER_SITE_OTHER): Payer: Self-pay | Admitting: Orthopaedic Surgery

## 2018-10-16 ENCOUNTER — Ambulatory Visit (INDEPENDENT_AMBULATORY_CARE_PROVIDER_SITE_OTHER): Payer: 59 | Admitting: Orthopaedic Surgery

## 2018-10-16 DIAGNOSIS — M25562 Pain in left knee: Secondary | ICD-10-CM | POA: Diagnosis not present

## 2018-10-16 MED ORDER — METHYLPREDNISOLONE ACETATE 40 MG/ML IJ SUSP
40.0000 mg | INTRAMUSCULAR | Status: AC | PRN
  Administered 2018-10-16: 40 mg via INTRA_ARTICULAR

## 2018-10-16 MED ORDER — LIDOCAINE HCL 1 % IJ SOLN
2.0000 mL | INTRAMUSCULAR | Status: AC | PRN
  Administered 2018-10-16: 2 mL

## 2018-10-16 MED ORDER — BUPIVACAINE HCL 0.5 % IJ SOLN
2.0000 mL | INTRAMUSCULAR | Status: AC | PRN
  Administered 2018-10-16: 2 mL via INTRA_ARTICULAR

## 2018-10-16 NOTE — Progress Notes (Signed)
Office Visit Note   Patient: Ethan Lambert           Date of Birth: 03-29-1961           MRN: [id number] Visit Date: 10/16/2018              Requested by: Maryruth Hancock, MD [place], [place] [id number] PCP: Patient, No Pcp Per   Assessment & Plan: Visit Diagnoses:  1. Acute pain of left knee     Plan: Impression is left knee pain with possible combination of degenerative medial meniscal tear and chondromalacia.  I have recommended a cortisone injection which we performed today without complication.  Patient instructed to notify us if he does not improve from this injection over the next 4 to 6 weeks.  Questions encouraged and answered.  Follow-up as needed.  Follow-Up Instructions: Return if symptoms worsen or fail to improve.   Orders:  Orders Placed This Encounter  Procedures  . XR Knee Complete 4 Views Left   No orders of the defined types were placed in this encounter.     Procedures: Large Joint Inj: L knee on 10/16/2018 9:42 AM Details: 22 G needle Medications: 2 mL bupivacaine 0.5 %; 2 mL lidocaine 1 %; 40 mg methylPREDNISolone acetate 40 MG/ML Outcome: tolerated well, no immediate complications Patient was prepped and draped in the usual sterile fashion.       Clinical Data: No additional findings.   Subjective: Chief Complaint  Patient presents with  . Left Knee - Pain    Ethan Lambert is a 58 year old gentleman who I have seen in the past for other issues who comes in with a new problem of left knee pain with onset 2 weeks ago without definite injury.  He states that the pain is worse when he is going downstairs.  He does endorse start up stiffness and pain.  Denies any swelling.  He does have history of a right knee medial meniscal tear which was treated arthroscopically.   Review of Systems  Constitutional: Negative.   All other systems reviewed and are negative.    Objective: Vital Signs: There were no vitals taken for this visit.   Physical Exam Vitals signs and nursing note reviewed.  Constitutional:      Appearance: He is well-developed.  HENT:     Head: Normocephalic and atraumatic.  Eyes:     Pupils: Pupils are equal, round, and reactive to light.  Neck:     Musculoskeletal: Neck supple.  Pulmonary:     Effort: Pulmonary effort is normal.  Abdominal:     Palpations: Abdomen is soft.  Musculoskeletal: Normal range of motion.  Skin:    General: Skin is warm.  Neurological:     Mental Status: He is alert and oriented to person, place, and time.  Psychiatric:        Behavior: Behavior normal.        Thought Content: Thought content normal.        Judgment: Judgment normal.     Ortho Exam Left knee exam shows no joint effusion.  Collaterals and cruciates are stable.  He has mild medial joint line tenderness otherwise the exam is unremarkable. Specialty Comments:  No specialty comments available.  Imaging: Xr Knee Complete 4 Views Left  Result Date: 10/16/2018 No acute or structural abnormalities.    PMFS History: Patient Active Problem List   Diagnosis Date Noted  . Rotator cuff syndrome of left shoulder 07/10/2018  . Paroxysmal  atrial fibrillation ([place]) 05/28/2018  . Subacromial bursitis of left shoulder joint 05/19/2018  . Screening examination for sexually transmitted disease 05/11/2018  . Need for prophylactic vaccination against Streptococcus pneumoniae (pneumococcus) 05/11/2018  . Medication monitoring encounter 02/12/2017  . Sprain of shoulder, right 10/01/2016  . Shoulder pain, right 09/26/2016  . Acromioclavicular joint arthritis 09/26/2016  . Encounter for long-term (current) use of medications 06/08/2015  . HNP (herniated nucleus pulposus), lumbar 03/15/2015  . Fatigue 01/26/2015  . S/P right knee arthroscopy 12/31/2013  . INGUINAL HERNIORRHAPHIES, BILATERAL, HX OF 01/15/2010  . VARICOCELE 07/05/2009  . HEMATOSPERMIA 06/27/2009  . Essential hypertension 12/08/2008  . Atrial  fibrillation ([place]) 12/08/2008  . Cerebral artery occlusion with cerebral infarction ([place]) 12/08/2008  . Human immunodeficiency virus disease ([place]) 11/18/2006   Past Medical History:  Diagnosis Date  . Anemia   . Arthritis    "probably right ring finger joint" (05/28/2018)  . Childhood asthma   . CVA (cerebral vascular accident) ([place]) 1999 X 2   LEFT FRONTAL & OCCIPITAL , NON-HEMORRHAGIC; no residual  . Family history of malignant neoplasm of gastrointestinal tract   . Heart murmur   . History of blood transfusion 1999   "related to the stroke"  . History of kidney stones   . HIV disease ([place]) dx 1999   MONITORED BY INFECTIOUS DISEASE -- DR [place]  . Hypertension   . Paroxysmal atrial fibrillation ([facility])    CARDIOLOGIST--  DR Rayann Heman  . Right knee meniscal tear   . Skin cancer    skin  . Sleep apnea    "didn't follow thru w/mask" (05/28/2018)    Family History  Problem Relation Age of Onset  . Arrhythmia Father   . Heart attack Brother   . Prostate cancer Maternal Grandfather   . Colon cancer Maternal Grandfather   . Lung cancer Mother        smoker  . Hypertension Mother   . Hyperlipidemia Mother   . Other Maternal Grandmother        harding of the arteries    Past Surgical History:  Procedure Laterality Date  . APPENDECTOMY  06-09-2003  . ATRIAL FIBRILLATION ABLATION  05/28/2018  . ATRIAL FIBRILLATION ABLATION N/A 05/28/2018   Procedure: ATRIAL FIBRILLATION ABLATION;  Surgeon: Thompson Grayer, MD;  Location: [facility];  Service: Cardiovascular;  Laterality: N/A;  . BACK SURGERY    . INGUINAL HERNIA REPAIR Bilateral 1990s  . KNEE ARTHROSCOPY WITH MEDIAL MENISECTOMY Right 12/31/2013   Procedure: RIGHT KNEE ARTHROSCOPY PARTIAL MEDIAL MENISCECTOMY DEBRIDEMENT AND CHONDROPLASTY ;  Surgeon: Sydnee Cabal, MD;  Location: [facility];  Service: Orthopedics;  Laterality: Right;  . LAPAROSCOPIC CHOLECYSTECTOMY  07-08-2003  . LASER ABLATION ANAL  CONDYLOMA  05-18-2001  . LUMBAR LAMINECTOMY/DECOMPRESSION MICRODISCECTOMY Left 03/15/2015   Procedure: Left Lumbar five- sacral one microdiskectomy;  Surgeon: Consuella Lose, MD;  Location: [facility];  Service: Neurosurgery;  Laterality: Left;  Left L5S1 microdiskectomy  . NEGATIVE SLEEP STUDY  01-03-2012  in epic  . SKIN CANCER EXCISION Right    "cut it off my shoulder"  . TRANSTHORACIC ECHOCARDIOGRAM  12-01-2008   MODERATE LVH/  EF 55-60%/  MILD MR/  NEGATIVE BUBBLE STUDY   Social History   Occupational History  . 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 Smoking status: Never Smoker  . Smokeless tobacco: Never Used  Substance and Sexual Activity  . Alcohol use: Yes  Alcohol/week: 0.0 standard drinks    Comment: 05/28/2018 "rarely; 1-2 per month"  . Drug use: Never  . Sexual activity: Yes    Partners: Male

## 2018-10-19 ENCOUNTER — Telehealth: Payer: Self-pay

## 2018-10-19 NOTE — Telephone Encounter (Signed)
Spoke with pt regarding appt on 10/21/18. Pt stated he will check vitals prior to appt. Pt questions and concerns were address.

## 2018-10-21 ENCOUNTER — Encounter: Payer: Self-pay | Admitting: Internal Medicine

## 2018-10-21 ENCOUNTER — Other Ambulatory Visit: Payer: Self-pay

## 2018-10-21 ENCOUNTER — Telehealth (INDEPENDENT_AMBULATORY_CARE_PROVIDER_SITE_OTHER): Payer: 59 | Admitting: Internal Medicine

## 2018-10-21 VITALS — BP 140/81 | HR 50 | Temp 97.5°F | Wt 263.0 lb

## 2018-10-21 DIAGNOSIS — G4733 Obstructive sleep apnea (adult) (pediatric): Secondary | ICD-10-CM

## 2018-10-21 DIAGNOSIS — I10 Essential (primary) hypertension: Secondary | ICD-10-CM | POA: Diagnosis not present

## 2018-10-21 DIAGNOSIS — I480 Paroxysmal atrial fibrillation: Secondary | ICD-10-CM

## 2018-10-21 NOTE — Progress Notes (Signed)
Electrophysiology TeleHealth Note   Due to national recommendations of social distancing due to COVID 19, an audio/video telehealth visit is felt to be most appropriate for this patient at this time.  See MyChart message from today for the patient's consent to telehealth for [facility].   Date:  10/21/2018   ID:  Ethan Lambert, DOB Jun 06, 1961, MRN [id number]  Location: patient's home  Provider location: [address] [place]  Evaluation Performed: Follow-up visit  PCP:  Patient, No Pcp Per   Electrophysiologist:  Dr Rayann Heman  Chief Complaint:  afib  History of Present Illness:    Ethan Lambert is a 58 y.o. male who presents via audio/video conferencing for a telehealth visit today.  Since last being seen in our clinic, the patient reports doing very well.  Afib is well controlled.  Rare palpitations which are short lived.  His primary concern is with L knee pain.  He works at the [employer] and probably has increased Covid exposure. Today, he denies symptoms of chest pain, shortness of breath,  lower extremity edema, dizziness, presyncope, or syncope.  The patient is otherwise without complaint today.  The patient denies symptoms of fevers, chills, cough, or new SOB worrisome for COVID 19.  Past Medical History:  Diagnosis Date  . Anemia   . Arthritis    "probably right ring finger joint" (05/28/2018)  . Childhood asthma   . CVA (cerebral vascular accident) ([place]) 1999 X 2   LEFT FRONTAL & OCCIPITAL , NON-HEMORRHAGIC; no residual  . Family history of malignant neoplasm of gastrointestinal tract   . Heart murmur   . History of blood transfusion 1999   "related to the stroke"  . History of kidney stones   . HIV disease ([place]) dx 1999   MONITORED BY INFECTIOUS DISEASE -- DR Calhoun  . Hypertension   . Paroxysmal atrial fibrillation (HCC)    CARDIOLOGIST--  DR Rayann Heman  . Right knee meniscal tear   . Skin cancer    skin  . Sleep apnea    "didn't follow  thru w/mask" (05/28/2018)    Past Surgical History:  Procedure Laterality Date  . APPENDECTOMY  06-09-2003  . ATRIAL FIBRILLATION ABLATION  05/28/2018  . ATRIAL FIBRILLATION ABLATION N/A 05/28/2018   Procedure: ATRIAL FIBRILLATION ABLATION;  Surgeon: Thompson Grayer, MD;  Location: [facility];  Service: Cardiovascular;  Laterality: N/A;  . BACK SURGERY    . INGUINAL HERNIA REPAIR Bilateral 1990s  . KNEE ARTHROSCOPY WITH MEDIAL MENISECTOMY Right 12/31/2013   Procedure: RIGHT KNEE ARTHROSCOPY PARTIAL MEDIAL MENISCECTOMY DEBRIDEMENT AND CHONDROPLASTY ;  Surgeon: Sydnee Cabal, MD;  Location: [facility];  Service: Orthopedics;  Laterality: Right;  . LAPAROSCOPIC CHOLECYSTECTOMY  07-08-2003  . LASER ABLATION ANAL CONDYLOMA  05-18-2001  . LUMBAR LAMINECTOMY/DECOMPRESSION MICRODISCECTOMY Left 03/15/2015   Procedure: Left Lumbar five- sacral one microdiskectomy;  Surgeon: Consuella Lose, MD;  Location: [facility];  Service: Neurosurgery;  Laterality: Left;  Left L5S1 microdiskectomy  . NEGATIVE SLEEP STUDY  01-03-2012  in epic  . SKIN CANCER EXCISION Right    "cut it off my shoulder"  . TRANSTHORACIC ECHOCARDIOGRAM  12-01-2008   MODERATE LVH/  EF 55-60%/  MILD MR/  NEGATIVE BUBBLE STUDY    Current Outpatient Medications  Medication Sig Dispense Refill  . apixaban (ELIQUIS) 5 MG TABS tablet Take 1 tablet (5 mg total) by mouth 2 (two) times daily. 180 tablet 1  . diltiazem (CARDIZEM CD)  240 MG 24 hr capsule TAKE 1 CAPSULE BY MOUTH  DAILY 90 capsule 2  . flecainide (TAMBOCOR) 100 MG tablet Take 1 tablet (100 mg total) by mouth 2 (two) times daily. 180 tablet 3  . metoprolol succinate (TOPROL XL) 25 MG 24 hr tablet Take 0.5 tablets (12.5 mg total) by mouth daily. 30 tablet 3  . TRIUMEQ 600-50-300 MG tablet TAKE 1 TABLET BY MOUTH  EVERY DAY (Patient taking differently: Take 1 tablet by mouth every evening. ) 30 tablet 5   No current facility-administered medications for  this visit.     Allergies:   Morphine   Social History:  The patient  reports that he has never smoked. He has never used smokeless tobacco. He reports current alcohol use. He reports that he does not use drugs.   Family History:  The patient's  family history includes Arrhythmia in his father; Colon cancer in his maternal grandfather; Heart attack in his brother; Hyperlipidemia in his mother; Hypertension in his mother; Lung cancer in his mother; Other in his maternal grandmother; Prostate cancer in his maternal grandfather.   ROS:  Please see the history of present illness.   All other systems are personally reviewed and negative.    Exam:    Vital Signs:  BP 140/81   Pulse (!) 50   Temp (!) 97.5 F (36.4 C)   Wt 263 lb (119.3 kg)   SpO2 97%   BMI 34.70 kg/m   Well appearing, alert and conversant, regular work of breathing,  good skin color Eyes- anicteric, neuro- grossly intact, skin- no apparent rash or lesions or cyanosis, mouth- oral mucosa is pink   Labs/Other Tests and Data Reviewed:    Recent Labs: 03/20/2018: ALT 28 05/11/2018: BUN 17; Creatinine, Ser 0.99; Hemoglobin 15.2; Platelets 276; Potassium 4.4; Sodium 140   Wt Readings from Last 3 Encounters:  10/21/18 263 lb (119.3 kg)  06/22/18 271 lb (122.9 kg)  05/29/18 267 lb 8 oz (121.3 kg)     Other studies personally reviewed: Additional studies/ records that were reviewed today include: my prior notes  Review of the above records today demonstrates: as above Prior radiographs: cardiac CT 05/26/18- calcium score 83%    ASSESSMENT & PLAN:    1.  Paroxysmal atrial fibrillation chads2vasc score is 3 (HTN, prior stroke).  On eliquis afib is doing very well Stop metoprolol  2. Overweight Lifestyle modification advised  3. HTN Stable No change required today  4. OSA He cannot afford dental appliance offered by Dr Ron Parker  COVID 19 screen The patient denies symptoms of COVID 19 at this time.  The  importance of social distancing was discussed today.  Follow-up:  AF clinic in 3 months   Current medicines are reviewed at length with the patient today.   The patient does not have concerns regarding his medicines.  The following changes were made today:  none  Labs/ tests ordered today include:  No orders of the defined types were placed in this encounter.   Patient Risk:  after full review of this patients clinical status, I feel that they are at moderate risk at this time.  Today, I have spent 20 minutes with the patient with telehealth technology discussing afib .    Army Fossa, MD  10/21/2018 8:17 AM     [address] [place] [address] [phone] (office) [phone] (fax)

## 2018-10-22 ENCOUNTER — Encounter (INDEPENDENT_AMBULATORY_CARE_PROVIDER_SITE_OTHER): Payer: Self-pay

## 2018-10-22 ENCOUNTER — Telehealth (INDEPENDENT_AMBULATORY_CARE_PROVIDER_SITE_OTHER): Payer: Self-pay | Admitting: Orthopaedic Surgery

## 2018-10-22 NOTE — Telephone Encounter (Signed)
Patient request a return to work note, he will be returning to work on 10/22/18.

## 2018-10-22 NOTE — Telephone Encounter (Signed)
yes

## 2018-10-22 NOTE — Telephone Encounter (Signed)
See message below °

## 2018-10-22 NOTE — Telephone Encounter (Signed)
Ready for pick up

## 2018-11-28 ENCOUNTER — Other Ambulatory Visit: Payer: Self-pay | Admitting: Internal Medicine

## 2018-12-16 ENCOUNTER — Other Ambulatory Visit: Payer: Self-pay

## 2018-12-16 ENCOUNTER — Ambulatory Visit (INDEPENDENT_AMBULATORY_CARE_PROVIDER_SITE_OTHER): Payer: 59 | Admitting: Orthopaedic Surgery

## 2018-12-16 DIAGNOSIS — Z01812 Encounter for preprocedural laboratory examination: Secondary | ICD-10-CM

## 2018-12-16 DIAGNOSIS — M25562 Pain in left knee: Secondary | ICD-10-CM | POA: Diagnosis not present

## 2018-12-16 MED ORDER — DIAZEPAM 5 MG PO TABS: 5.0000 mg | ORAL_TABLET | Freq: Once | ORAL | 0 refills | Status: AC

## 2018-12-16 NOTE — Addendum Note (Signed)
Addended by: Minda Ditto, Geoffery Spruce on: 12/16/2018 02:28 PM   Modules accepted: Orders

## 2018-12-16 NOTE — Progress Notes (Signed)
Office Visit Note   Patient: Ethan Lambert           Date of Birth: [birth year]           MRN: [id number] Visit Date: 12/16/2018              Requested by: No referring provider defined for this encounter. PCP: Patient, No Pcp Per   Assessment & Plan: Visit Diagnoses:  1. Acute pain of left knee     Plan: Impression is continued left knee pain with no relief from cortisone injection.  At this point we will obtain MRI of the left knee to evaluate for structural abnormalities.  Prescription for Valium was sent to the pharmacy to help with the MRI.  Follow-up after the MRI.  Work note provided today.  Follow-Up Instructions: Return in about 2 weeks (around 12/30/2018).   Orders:  No orders of the defined types were placed in this encounter.  Meds ordered this encounter  Medications  . diazepam (VALIUM) 5 MG tablet    Sig: Take 1-2 tablets (5-10 mg total) by mouth once for 1 dose.    Dispense:  2 tablet    Refill:  0      Procedures: No procedures performed   Clinical Data: No additional findings.   Subjective: Chief Complaint  Patient presents with  . Left Knee - Pain    Ethan Lambert returns today for continued pain in his left knee.  Prior cortisone injection did not give him any relief.  His left knee pain is very reminiscent of his right knee pain from several years ago when he had a complex tear of the medial meniscus which needed arthroscopic debridement.  Otherwise no change in symptoms.   Review of Systems  Constitutional: Negative.   All other systems reviewed and are negative.    Objective: Vital Signs: There were no vitals taken for this visit.  Physical Exam Vitals signs and nursing note reviewed.  Constitutional:      Appearance: He is well-developed.  Pulmonary:     Effort: Pulmonary effort is normal.  Abdominal:     Palpations: Abdomen is soft.  Skin:    General: Skin is warm.  Neurological:     Mental Status: He is alert and oriented to  person, place, and time.  Psychiatric:        Behavior: Behavior normal.        Thought Content: Thought content normal.        Judgment: Judgment normal.     Ortho Exam Left knee exam shows no joint effusion.  Medial joint line tenderness.  Collaterals and cruciates are stable. Specialty Comments:  No specialty comments available.  Imaging: No results found.   PMFS History: Patient Active Problem List   Diagnosis Date Noted  . Rotator cuff syndrome of left shoulder 07/10/2018  . Paroxysmal atrial fibrillation ([place]) 05/28/2018  . Subacromial bursitis of left shoulder joint 05/19/2018  . Screening examination for sexually transmitted disease 05/11/2018  . Need for prophylactic vaccination against Streptococcus pneumoniae (pneumococcus) 05/11/2018  . Medication monitoring encounter 02/12/2017  . Sprain of shoulder, right 10/01/2016  . Shoulder pain, right 09/26/2016  . Acromioclavicular joint arthritis 09/26/2016  . Encounter for long-term (current) use of medications 06/08/2015  . HNP (herniated nucleus pulposus), lumbar 03/15/2015  . Fatigue 01/26/2015  . S/P right knee arthroscopy 12/31/2013  . INGUINAL HERNIORRHAPHIES, BILATERAL, HX OF 01/15/2010  . VARICOCELE 07/05/2009  . HEMATOSPERMIA 06/27/2009  . Essential hypertension 12/08/2008  .  Atrial fibrillation ([place]) 12/08/2008  . Cerebral artery occlusion with cerebral infarction ([place]) 12/08/2008  . Human immunodeficiency virus disease ([place]) 11/18/2006   Past Medical History:  Diagnosis Date  . Anemia   . Arthritis    "probably right ring finger joint" (05/28/2018)  . Childhood asthma   . CVA (cerebral vascular accident) ([place]) 1999 X 2   LEFT FRONTAL & OCCIPITAL , NON-HEMORRHAGIC; no residual  . Family history of malignant neoplasm of gastrointestinal tract   . Heart murmur   . History of blood transfusion 1999   "related to the stroke"  . History of kidney stones   . HIV disease ([place]) dx 1999   MONITORED BY  INFECTIOUS DISEASE -- DR Waverly  . Hypertension   . Paroxysmal atrial fibrillation (HCC)    CARDIOLOGIST--  DR Rayann Heman  . Right knee meniscal tear   . Skin cancer    skin  . Sleep apnea    "didn't follow thru w/mask" (05/28/2018)    Family History  Problem Relation Age of Onset  . Arrhythmia Father   . Heart attack Brother   . Prostate cancer Maternal Grandfather   . Colon cancer Maternal Grandfather   . Lung cancer Mother        smoker  . Hypertension Mother   . Hyperlipidemia Mother   . Other Maternal Grandmother        harding of the arteries    Past Surgical History:  Procedure Laterality Date  . APPENDECTOMY  06-09-2003  . ATRIAL FIBRILLATION ABLATION  05/28/2018  . ATRIAL FIBRILLATION ABLATION N/A 05/28/2018   Procedure: ATRIAL FIBRILLATION ABLATION;  Surgeon: Thompson Grayer, MD;  Location: [facility];  Service: Cardiovascular;  Laterality: N/A;  . BACK SURGERY    . INGUINAL HERNIA REPAIR Bilateral 1990s  . KNEE ARTHROSCOPY WITH MEDIAL MENISECTOMY Right 12/31/2013   Procedure: RIGHT KNEE ARTHROSCOPY PARTIAL MEDIAL MENISCECTOMY DEBRIDEMENT AND CHONDROPLASTY ;  Surgeon: Sydnee Cabal, MD;  Location: [facility];  Service: Orthopedics;  Laterality: Right;  . LAPAROSCOPIC CHOLECYSTECTOMY  07-08-2003  . LASER ABLATION ANAL CONDYLOMA  05-18-2001  . LUMBAR LAMINECTOMY/DECOMPRESSION MICRODISCECTOMY Left 03/15/2015   Procedure: Left Lumbar five- sacral one microdiskectomy;  Surgeon: Consuella Lose, MD;  Location: [facility];  Service: Neurosurgery;  Laterality: Left;  Left L5S1 microdiskectomy  . NEGATIVE SLEEP STUDY  01-03-2012  in epic  . SKIN CANCER EXCISION Right    "cut it off my shoulder"  . TRANSTHORACIC ECHOCARDIOGRAM  12-01-2008   MODERATE LVH/  EF 55-60%/  MILD MR/  NEGATIVE BUBBLE STUDY   Social History   Occupational History  . 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 Smoking status: Never  Smoker  . Smokeless tobacco: Never Used  Substance and Sexual Activity  . Alcohol use: Yes    Alcohol/week: 0.0 standard drinks    Comment: 05/28/2018 "rarely; 1-2 per month"  . Drug use: Never  . Sexual activity: Yes    Partners: Male

## 2018-12-16 NOTE — Addendum Note (Signed)
Addended by: Minda Ditto, Geoffery Spruce on: 12/16/2018 02:26 PM   Modules accepted: Orders

## 2018-12-23 ENCOUNTER — Other Ambulatory Visit: Payer: Self-pay | Admitting: Internal Medicine

## 2019-01-08 ENCOUNTER — Other Ambulatory Visit: Payer: Self-pay

## 2019-01-08 ENCOUNTER — Encounter (HOSPITAL_COMMUNITY): Payer: Self-pay | Admitting: Nurse Practitioner

## 2019-01-08 ENCOUNTER — Ambulatory Visit (HOSPITAL_COMMUNITY)
Admission: RE | Admit: 2019-01-08 | Discharge: 2019-01-08 | Disposition: A | Discharge status: Home / Self Care | Payer: 59 | Source: Ambulatory Visit | Attending: Nurse Practitioner | Admitting: Nurse Practitioner

## 2019-01-08 ENCOUNTER — Ambulatory Visit (HOSPITAL_COMMUNITY)
Admission: EM | Admit: 2019-01-08 | Discharge: 2019-01-08 | Disposition: A | Discharge status: Home / Self Care | Payer: 59 | Attending: Urgent Care | Admitting: Urgent Care

## 2019-01-08 ENCOUNTER — Encounter (HOSPITAL_COMMUNITY): Payer: Self-pay | Admitting: Urgent Care

## 2019-01-08 VITALS — BP 134/82 | HR 64 | Ht 73.0 in | Wt 273.0 lb

## 2019-01-08 DIAGNOSIS — I10 Essential (primary) hypertension: Secondary | ICD-10-CM | POA: Insufficient documentation

## 2019-01-08 DIAGNOSIS — T7840XA Allergy, unspecified, initial encounter: Secondary | ICD-10-CM | POA: Diagnosis not present

## 2019-01-08 DIAGNOSIS — Z886 Allergy status to analgesic agent status: Secondary | ICD-10-CM | POA: Insufficient documentation

## 2019-01-08 DIAGNOSIS — Z85828 Personal history of other malignant neoplasm of skin: Secondary | ICD-10-CM | POA: Insufficient documentation

## 2019-01-08 DIAGNOSIS — R635 Abnormal weight gain: Secondary | ICD-10-CM | POA: Insufficient documentation

## 2019-01-08 DIAGNOSIS — I480 Paroxysmal atrial fibrillation: Secondary | ICD-10-CM

## 2019-01-08 DIAGNOSIS — L299 Pruritus, unspecified: Secondary | ICD-10-CM | POA: Insufficient documentation

## 2019-01-08 DIAGNOSIS — Z7901 Long term (current) use of anticoagulants: Secondary | ICD-10-CM | POA: Diagnosis not present

## 2019-01-08 DIAGNOSIS — Z8042 Family history of malignant neoplasm of prostate: Secondary | ICD-10-CM | POA: Insufficient documentation

## 2019-01-08 DIAGNOSIS — G4730 Sleep apnea, unspecified: Secondary | ICD-10-CM | POA: Diagnosis not present

## 2019-01-08 DIAGNOSIS — Z8249 Family history of ischemic heart disease and other diseases of the circulatory system: Secondary | ICD-10-CM | POA: Diagnosis not present

## 2019-01-08 DIAGNOSIS — Z79899 Other long term (current) drug therapy: Secondary | ICD-10-CM | POA: Diagnosis not present

## 2019-01-08 DIAGNOSIS — Z8673 Personal history of transient ischemic attack (TIA), and cerebral infarction without residual deficits: Secondary | ICD-10-CM | POA: Insufficient documentation

## 2019-01-08 DIAGNOSIS — Z800 Family history of malignant neoplasm of digestive organs: Secondary | ICD-10-CM | POA: Diagnosis not present

## 2019-01-08 DIAGNOSIS — Z801 Family history of malignant neoplasm of trachea, bronchus and lung: Secondary | ICD-10-CM | POA: Insufficient documentation

## 2019-01-08 DIAGNOSIS — Z6836 Body mass index (BMI) 36.0-36.9, adult: Secondary | ICD-10-CM | POA: Diagnosis not present

## 2019-01-08 DIAGNOSIS — B20 Human immunodeficiency virus [HIV] disease: Secondary | ICD-10-CM | POA: Insufficient documentation

## 2019-01-08 MED ORDER — HYDROXYZINE HCL 25 MG PO TABS: 12.5000 mg | ORAL_TABLET | Freq: Three times a day (TID) | ORAL | 0 refills | Status: DC | PRN

## 2019-01-08 MED ORDER — PREDNISONE 20 MG PO TABS: ORAL_TABLET | ORAL | 0 refills | Status: DC

## 2019-01-08 NOTE — Discharge Instructions (Signed)
If hydroxyzine makes you sleepy, then just take it at bedtime.

## 2019-01-08 NOTE — Progress Notes (Signed)
Patient ID: Ethan Lambert, male   DOB: 14-Nov-1960, 58 y.o.   MRN: [id number]     Primary Care Physician: Patient, No Pcp Per Referring Physician: Dr. Philip Aspen is a 58 y.o. male with a h/o PAF for evaluation s/p ablation 05/28/18. He has done well since then. He has stopped his flecainide and hs not noted any afib.He is in SR this am.  No alcohol use, mod caffeine, usually not a trigger, negative sleep study in past. 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  He has developed a generalized pruritic  body rash over the last few days. He was mowing and pulling some weeks that he believed had some poison ivy but the rash does not have fluid filled pustules that is common with poison ivy. He has taken some benadryl that has been helpful but has not totally alleviated the rash.   Today, he denies symptoms of palpitations, chest pain, shortness of breath, orthopnea, PND, lower extremity edema, dizziness, presyncope, syncope, or neurologic sequela. The patient is tolerating medications without difficulties and is otherwise without complaint today.   Past Medical History:  Diagnosis Date  . Anemia   . Arthritis    "probably right ring finger joint" (05/28/2018)  . Childhood asthma   . CVA (cerebral vascular accident) ([place]) 1999 X 2   LEFT FRONTAL & OCCIPITAL , NON-HEMORRHAGIC; no residual  . Family history of malignant neoplasm of gastrointestinal tract   . Heart murmur   . History of blood transfusion 1999   "related to the stroke"  . History of kidney stones   . HIV disease ([place]) dx 1999   MONITORED BY INFECTIOUS DISEASE -- DR Foxworth  . Hypertension   . Paroxysmal atrial fibrillation (HCC)    CARDIOLOGIST--  DR Rayann Heman  . Right knee meniscal tear   . Skin cancer    skin  . Sleep apnea    "didn't follow thru w/mask" (05/28/2018)   Past Surgical History:  Procedure Laterality Date  . APPENDECTOMY   06-09-2003  . ATRIAL FIBRILLATION ABLATION  05/28/2018  . ATRIAL FIBRILLATION ABLATION N/A 05/28/2018   Procedure: ATRIAL FIBRILLATION ABLATION;  Surgeon: Thompson Grayer, MD;  Location: [facility];  Service: Cardiovascular;  Laterality: N/A;  . BACK SURGERY    . INGUINAL HERNIA REPAIR Bilateral 1990s  . KNEE ARTHROSCOPY WITH MEDIAL MENISECTOMY Right 12/31/2013   Procedure: RIGHT KNEE ARTHROSCOPY PARTIAL MEDIAL MENISCECTOMY DEBRIDEMENT AND CHONDROPLASTY ;  Surgeon: Sydnee Cabal, MD;  Location: [facility];  Service: Orthopedics;  Laterality: Right;  . LAPAROSCOPIC CHOLECYSTECTOMY  07-08-2003  . LASER ABLATION ANAL CONDYLOMA  05-18-2001  . LUMBAR LAMINECTOMY/DECOMPRESSION MICRODISCECTOMY Left 03/15/2015   Procedure: Left Lumbar five- sacral one microdiskectomy;  Surgeon: Consuella Lose, MD;  Location: [facility];  Service: Neurosurgery;  Laterality: Left;  Left L5S1 microdiskectomy  . NEGATIVE SLEEP STUDY  01-03-2012  in epic  . SKIN CANCER EXCISION Right    "cut it off my shoulder"  . TRANSTHORACIC ECHOCARDIOGRAM  12-01-2008   MODERATE LVH/  EF 55-60%/  MILD MR/  NEGATIVE BUBBLE STUDY    Current Outpatient Medications  Medication Sig Dispense Refill  . apixaban (ELIQUIS) 5 MG TABS tablet Take 1 tablet (5 mg total) by mouth 2 (two) times daily. 180 tablet 1  . diltiazem (CARDIZEM CD) 240 MG 24 hr capsule TAKE 1 CAPSULE BY MOUTH  DAILY 90  capsule 2  . TRIUMEQ 600-50-300 MG tablet TAKE 1 TABLET BY MOUTH  EVERY DAY 30 tablet 5   No current facility-administered medications for this encounter.     Allergies  Allergen Reactions  . Morphine Other (See Comments)    hallucinations    Social History   Socioeconomic History  . Marital status: Married    Spouse name: Not on file  . Number of children: 1  . Years of education: Not on file  . Highest education level: Not on file  Occupational History  . Occupation: [employer] Armed forces operational officer: [employer]  Social Needs  . Financial resource strain: Not on file  . Food insecurity    Worry: Not on file    Inability: Not on file  . Transportation needs    Medical: Not on file    Non-medical: Not on file  Tobacco Use  . Smoking status: Never Smoker  . Smokeless tobacco: Never Used  Substance and Sexual Activity  . Alcohol use: Yes    Alcohol/week: 0.0 standard drinks    Comment: 05/28/2018 "rarely; 1-2 per month"  . Drug use: Never  . Sexual activity: Yes    Partners: Male  Lifestyle  . Physical activity    Days per week: Not on file    Minutes per session: Not on file  . Stress: Not on file  Relationships  . Social Herbalist on phone: Not on file    Gets together: Not on file    Attends religious service: Not on file    Active member of club or organization: Not on file    Attends meetings of clubs or organizations: Not on file    Relationship status: Not on file  . Intimate partner violence    Fear of current or ex partner: Not on file    Emotionally abused: Not on file    Physically abused: Not on file    Forced sexual activity: Not on file  Other Topics Concern  . Not on file  Social History Narrative   Pt lives in [place].  Unemployed.    Family History  Problem Relation Age of Onset  . Arrhythmia Father   . Heart attack Brother   . Prostate cancer Maternal Grandfather   . Colon cancer Maternal Grandfather   . Lung cancer Mother        smoker  . Hypertension Mother   . Hyperlipidemia Mother   . Other Maternal Grandmother        harding of the arteries    ROS- All systems are reviewed and negative except as per the HPI above  Physical Exam: Vitals:   01/08/19 1032  Weight: 123.8 kg  Height: 6\' 1"  (1.854 m)    GEN- The patient is well appearing, alert and oriented x 3 today.   Head- normocephalic, atraumatic Eyes-  Sclera clear, conjunctiva pink Ears- hearing intact Oropharynx- clear Neck- supple, no JVP Lymph- no  cervical lymphadenopathy Lungs- Clear to ausculation bilaterally, normal work of breathing Heart- Regular rate and rhythm, no murmurs, rubs or gallops, PMI not laterally displaced GI- soft, NT, ND, + BS Extremities- no clubbing, cyanosis, or edema MS- no significant deformity or atrophy Skin- no rash or lesion Psych- euthymic mood, full affect Neuro- strength and sensation are intact  EKG- NSR at 64 bpm, pr int 192 ms, qrs int 116 ms, qtc 412 ms  Epic records reviewed  Assessment and Plan: 1. PAF S/p ablation  Maintaining  SR Off flecainide  Continue diltiazem at 240 mg daily  Continue eliquis with CHA2DS2VASc score of 1, reminded not to interrupt drug  2. Inactivity/weight gain Increase activity with weight loss encouraged  Has a knee issue currently that he has f/u posted  3. Generalized pruritic rash  Pt will go to urgent care from here for eval and treatment   F/u here in 6 months  Butch Penny C. Timoteo Carreiro, [facility] [address] [place], [place] [address] [phone]

## 2019-01-08 NOTE — ED Triage Notes (Signed)
Patient presents to Urgent Care with complaints of itching since 5 days ago. Patient reports he ate some watermelon and he thinks that is what caused it. Pt has been taking childrens benadryl. Small red bumps noted on skin, pt denies pain.

## 2019-01-08 NOTE — ED Provider Notes (Addendum)
MRN: [id number] DOB: 03/04/1961  Subjective:   Ethan Lambert is a 58 y.o. male presenting for 5-day history of acute onset, worsening pruritic rash that looks like red spots over his entire body except for the face and genital area.  Patient reports that the only new exposure has been eating watermelon twice in the last 5 days.  He is also done some yard work but cannot recall any particular poisonous plant that he came into contact, tried to wear protective gear as well.  He has been using Benadryl without resolution but does get temporary relief.  Has not started any new medications, no tick bites, no new hygiene products.  Review of systems below.  No current facility-administered medications for this encounter.   Current Outpatient Medications:  .  apixaban (ELIQUIS) 5 MG TABS tablet, Take 1 tablet (5 mg total) by mouth 2 (two) times daily., Disp: 180 tablet, Rfl: 1 .  diltiazem (CARDIZEM CD) 240 MG 24 hr capsule, TAKE 1 CAPSULE BY MOUTH  DAILY, Disp: 90 capsule, Rfl: 2 .  TRIUMEQ 600-50-300 MG tablet, TAKE 1 TABLET BY MOUTH  EVERY DAY, Disp: 30 tablet, Rfl: 5    Allergies  Allergen Reactions  . Morphine Other (See Comments)    hallucinations    Past Medical History:  Diagnosis Date  . Anemia   . Arthritis    "probably right ring finger joint" (05/28/2018)  . Childhood asthma   . CVA (cerebral vascular accident) ([place]) 1999 X 2   LEFT FRONTAL & OCCIPITAL , NON-HEMORRHAGIC; no residual  . Family history of malignant neoplasm of gastrointestinal tract   . Heart murmur   . History of blood transfusion 1999   "related to the stroke"  . History of kidney stones   . HIV disease ([place]) dx 1999   MONITORED BY INFECTIOUS DISEASE -- DR [place]  . Hypertension   . Paroxysmal atrial fibrillation (HCC)    CARDIOLOGIST--  DR Rayann Heman  . Right knee meniscal tear   . Skin cancer    skin  . Sleep apnea    "didn't follow thru w/mask" (05/28/2018)     Past Surgical History:  Procedure  Laterality Date  . APPENDECTOMY  06-09-2003  . ATRIAL FIBRILLATION ABLATION  05/28/2018  . ATRIAL FIBRILLATION ABLATION N/A 05/28/2018   Procedure: ATRIAL FIBRILLATION ABLATION;  Surgeon: Thompson Grayer, MD;  Location: [facility];  Service: Cardiovascular;  Laterality: N/A;  . BACK SURGERY    . INGUINAL HERNIA REPAIR Bilateral 1990s  . KNEE ARTHROSCOPY WITH MEDIAL MENISECTOMY Right 12/31/2013   Procedure: RIGHT KNEE ARTHROSCOPY PARTIAL MEDIAL MENISCECTOMY DEBRIDEMENT AND CHONDROPLASTY ;  Surgeon: Sydnee Cabal, MD;  Location: [facility];  Service: Orthopedics;  Laterality: Right;  . LAPAROSCOPIC CHOLECYSTECTOMY  07-08-2003  . LASER ABLATION ANAL CONDYLOMA  05-18-2001  . LUMBAR LAMINECTOMY/DECOMPRESSION MICRODISCECTOMY Left 03/15/2015   Procedure: Left Lumbar five- sacral one microdiskectomy;  Surgeon: Consuella Lose, MD;  Location: [facility];  Service: Neurosurgery;  Laterality: Left;  Left L5S1 microdiskectomy  . NEGATIVE SLEEP STUDY  01-03-2012  in epic  . SKIN CANCER EXCISION Right    "cut it off my shoulder"  . TRANSTHORACIC ECHOCARDIOGRAM  12-01-2008   MODERATE LVH/  EF 55-60%/  MILD MR/  NEGATIVE BUBBLE STUDY    Review of Systems  Constitutional: Negative for fever and malaise/fatigue.  HENT: Negative for congestion, ear pain, sinus pain and sore throat.   Eyes: Negative for blurred vision, double vision, discharge and redness.  Respiratory: Negative for cough, hemoptysis, shortness of breath and wheezing.   Cardiovascular: Negative for chest pain.  Gastrointestinal: Negative for abdominal pain, diarrhea, nausea and vomiting.  Genitourinary: Negative for dysuria, flank pain and hematuria.  Musculoskeletal: Negative for myalgias.  Skin: Positive for itching and rash.  Neurological: Negative for dizziness, weakness and headaches.  Psychiatric/Behavioral: Negative for depression and substance abuse.    Objective:   Vitals: BP 139/84 (BP Location:  Right Arm)   Pulse 70   Temp 98.5 F (36.9 C) (Oral)   Resp 16   SpO2 97%   Physical Exam Constitutional:      General: He is not in acute distress.    Appearance: Normal appearance. He is well-developed. He is not ill-appearing, toxic-appearing or diaphoretic.  HENT:     Head: Normocephalic and atraumatic.     Right Ear: External ear normal.     Left Ear: External ear normal.     Nose: Nose normal.     Mouth/Throat:     Mouth: Mucous membranes are moist.     Pharynx: Oropharynx is clear.  Eyes:     General: No scleral icterus.    Extraocular Movements: Extraocular movements intact.     Pupils: Pupils are equal, round, and reactive to light.  Cardiovascular:     Rate and Rhythm: Normal rate and regular rhythm.     Heart sounds: Normal heart sounds. No murmur. No friction rub. No gallop.   Pulmonary:     Effort: Pulmonary effort is normal. No respiratory distress.     Breath sounds: Normal breath sounds. No stridor. No wheezing, rhonchi or rales.  Skin:    General: Skin is warm and dry.     Findings: Rash (Diffusely scattered solitary red lesions all less than 1/2cm in size over limbs and torso excluding the face) present.  Neurological:     Mental Status: He is alert and oriented to person, place, and time.  Psychiatric:        Mood and Affect: Mood normal.        Behavior: Behavior normal.        Thought Content: Thought content normal.      Assessment and Plan :   1. Allergic rash present on examination   2. Itching     Will use 15-day steroid course given exposure to plants outdoors.  Recommended patient stop eating watermelon. Counseled patient on potential for adverse effects with medications prescribed/recommended today, ER and return-to-clinic precautions discussed, patient verbalized understanding.   Jaynee Eagles, [place] 01/08/19 1313

## 2019-01-16 ENCOUNTER — Ambulatory Visit
Admission: RE | Admit: 2019-01-16 | Discharge: 2019-01-16 | Disposition: A | Discharge status: Home / Self Care | Payer: 59 | Source: Ambulatory Visit | Attending: Orthopaedic Surgery | Admitting: Orthopaedic Surgery

## 2019-01-16 ENCOUNTER — Other Ambulatory Visit: Payer: Self-pay

## 2019-01-16 ENCOUNTER — Other Ambulatory Visit: Payer: 59

## 2019-01-16 DIAGNOSIS — M25562 Pain in left knee: Secondary | ICD-10-CM

## 2019-01-22 ENCOUNTER — Other Ambulatory Visit: Payer: Self-pay

## 2019-01-22 ENCOUNTER — Encounter (HOSPITAL_BASED_OUTPATIENT_CLINIC_OR_DEPARTMENT_OTHER): Payer: Self-pay | Admitting: *Deleted

## 2019-01-22 ENCOUNTER — Ambulatory Visit (INDEPENDENT_AMBULATORY_CARE_PROVIDER_SITE_OTHER): Payer: 59 | Admitting: Orthopaedic Surgery

## 2019-01-22 DIAGNOSIS — S83242D Other tear of medial meniscus, current injury, left knee, subsequent encounter: Secondary | ICD-10-CM | POA: Diagnosis not present

## 2019-01-22 NOTE — Progress Notes (Signed)
Office Visit Note   Patient: Ethan Lambert           Date of Birth: [birth year]           MRN: [id number] Visit Date: 01/22/2019              Requested by: No referring provider defined for this encounter. PCP: Patient, No Pcp Per   Assessment & Plan: Visit Diagnoses:  1. Acute medial meniscus tear, left, subsequent encounter     Plan: MRI shows a displaced tear of the posterior horn of the medial meniscus with the displaced fragment flipped into the femoral notch.  He has some focal areas of chondromalacia of the medial femoral condyle on the weightbearing surface.  He denies any posterior knee pain.  His pain is all along the medial joint line.  MRI is consistent with his pain.  He has failed conservative treatment up to this point and therefore we will proceed with arthroscopic partial medial meniscectomy.  He takes Eliquis for prior stroke.  He has had ablation for his atrial fibrillation.  He will need to stop his Eliquis 3 days prior to the surgery.  Risks and benefits and rehab and recovery were reviewed with the patient today.  He would like to have this done as soon as possible.  Anticipate out of work in 6 to 8 weeks.  Follow-Up Instructions: Return for 1 week postop visit.   Orders:  No orders of the defined types were placed in this encounter.  No orders of the defined types were placed in this encounter.     Procedures: No procedures performed   Clinical Data: No additional findings.   Subjective: Chief Complaint  Patient presents with  . Left Knee - Follow-up    [name] returns today for MRI review.  He has been taken out of work by his supervisor due to no light duty.   Review of Systems  Constitutional: Negative.   All other systems reviewed and are negative.    Objective: Vital Signs: There were no vitals taken for this visit.  Physical Exam Vitals signs and nursing note reviewed.  Constitutional:      Appearance: He is well-developed.   Pulmonary:     Effort: Pulmonary effort is normal.  Abdominal:     Palpations: Abdomen is soft.  Skin:    General: Skin is warm.  Neurological:     Mental Status: He is alert and oriented to person, place, and time.  Psychiatric:        Behavior: Behavior normal.        Thought Content: Thought content normal.        Judgment: Judgment normal.     Ortho Exam Left knee exam is unchanged. Specialty Comments:  No specialty comments available.  Imaging: No results found.   PMFS History: Patient Active Problem List   Diagnosis Date Noted  . Acute medial meniscus tear, left, subsequent encounter 01/22/2019  . Rotator cuff syndrome of left shoulder 07/10/2018  . Paroxysmal atrial fibrillation ([place]) 05/28/2018  . Subacromial bursitis of left shoulder joint 05/19/2018  . Screening examination for sexually transmitted disease 05/11/2018  . Need for prophylactic vaccination against Streptococcus pneumoniae (pneumococcus) 05/11/2018  . Medication monitoring encounter 02/12/2017  . Sprain of shoulder, right 10/01/2016  . Shoulder pain, right 09/26/2016  . Acromioclavicular joint arthritis 09/26/2016  . Encounter for long-term (current) use of medications 06/08/2015  . HNP (herniated nucleus pulposus), lumbar 03/15/2015  . Fatigue 01/26/2015  .  S/P right knee arthroscopy 12/31/2013  . INGUINAL HERNIORRHAPHIES, BILATERAL, HX OF 01/15/2010  . VARICOCELE 07/05/2009  . HEMATOSPERMIA 06/27/2009  . Essential hypertension 12/08/2008  . Atrial fibrillation ([place]) 12/08/2008  . Cerebral artery occlusion with cerebral infarction ([place]) 12/08/2008  . Human immunodeficiency virus disease ([place]) 11/18/2006   Past Medical History:  Diagnosis Date  . Anemia   . Arthritis    "probably right ring finger joint" (05/28/2018)  . Childhood asthma   . CVA (cerebral vascular accident) ([place]) 1999 X 2   LEFT FRONTAL & OCCIPITAL , NON-HEMORRHAGIC; no residual  . Family history of malignant neoplasm of  gastrointestinal tract   . Heart murmur   . History of blood transfusion 1999   "related to the stroke"  . History of kidney stones   . HIV disease ([place]) dx 1999   MONITORED BY INFECTIOUS DISEASE -- DR [place]  . Hypertension   . Paroxysmal atrial fibrillation (HCC)    CARDIOLOGIST--  DR Rayann Heman  . Right knee meniscal tear   . Skin cancer    skin  . Sleep apnea    "didn't follow thru w/mask" (05/28/2018)    Family History  Problem Relation Age of Onset  . Arrhythmia Father   . Heart attack Brother   . Prostate cancer Maternal Grandfather   . Colon cancer Maternal Grandfather   . Lung cancer Mother        smoker  . Hypertension Mother   . Hyperlipidemia Mother   . Other Maternal Grandmother        harding of the arteries    Past Surgical History:  Procedure Laterality Date  . APPENDECTOMY  06-09-2003  . ATRIAL FIBRILLATION ABLATION  05/28/2018  . ATRIAL FIBRILLATION ABLATION N/A 05/28/2018   Procedure: ATRIAL FIBRILLATION ABLATION;  Surgeon: Thompson Grayer, MD;  Location: [facility];  Service: Cardiovascular;  Laterality: N/A;  . BACK SURGERY    . INGUINAL HERNIA REPAIR Bilateral 1990s  . KNEE ARTHROSCOPY WITH MEDIAL MENISECTOMY Right 12/31/2013   Procedure: RIGHT KNEE ARTHROSCOPY PARTIAL MEDIAL MENISCECTOMY DEBRIDEMENT AND CHONDROPLASTY ;  Surgeon: Sydnee Cabal, MD;  Location: [facility];  Service: Orthopedics;  Laterality: Right;  . LAPAROSCOPIC CHOLECYSTECTOMY  07-08-2003  . LASER ABLATION ANAL CONDYLOMA  05-18-2001  . LUMBAR LAMINECTOMY/DECOMPRESSION MICRODISCECTOMY Left 03/15/2015   Procedure: Left Lumbar five- sacral one microdiskectomy;  Surgeon: Consuella Lose, MD;  Location: [facility];  Service: Neurosurgery;  Laterality: Left;  Left L5S1 microdiskectomy  . NEGATIVE SLEEP STUDY  01-03-2012  in epic  . SKIN CANCER EXCISION Right    "cut it off my shoulder"  . TRANSTHORACIC ECHOCARDIOGRAM  12-01-2008   MODERATE LVH/  EF 55-60%/  MILD  MR/  NEGATIVE BUBBLE STUDY   Social History   Occupational History  . 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 Smoking status: Never Smoker  . Smokeless tobacco: Never Used  Substance and Sexual Activity  . Alcohol use: Yes    Alcohol/week: 0.0 standard drinks    Comment: 05/28/2018 "rarely; 1-2 per month"  . Drug use: Never  . Sexual activity: Yes    Partners: Male

## 2019-01-22 NOTE — Progress Notes (Signed)
Patient already had sleep test which showed  no OSA

## 2019-01-23 ENCOUNTER — Other Ambulatory Visit (HOSPITAL_COMMUNITY)
Admission: RE | Admit: 2019-01-23 | Discharge: 2019-01-23 | Disposition: A | Discharge status: Home / Self Care | Payer: 59 | Source: Ambulatory Visit | Attending: Orthopaedic Surgery | Admitting: Orthopaedic Surgery

## 2019-01-23 DIAGNOSIS — Z20828 Contact with and (suspected) exposure to other viral communicable diseases: Secondary | ICD-10-CM | POA: Diagnosis not present

## 2019-01-23 DIAGNOSIS — Z01812 Encounter for preprocedural laboratory examination: Secondary | ICD-10-CM | POA: Insufficient documentation

## 2019-01-23 LAB — SARS CORONAVIRUS 2 (TAT 6-24 HRS): SARS Coronavirus 2: NEGATIVE

## 2019-01-25 ENCOUNTER — Other Ambulatory Visit: Payer: Self-pay

## 2019-01-25 ENCOUNTER — Encounter (HOSPITAL_BASED_OUTPATIENT_CLINIC_OR_DEPARTMENT_OTHER)
Admission: RE | Admit: 2019-01-25 | Discharge: 2019-01-25 | Disposition: A | Discharge status: Home / Self Care | Payer: 59 | Source: Ambulatory Visit | Attending: Orthopaedic Surgery | Admitting: Orthopaedic Surgery

## 2019-01-25 DIAGNOSIS — Z7901 Long term (current) use of anticoagulants: Secondary | ICD-10-CM | POA: Diagnosis not present

## 2019-01-25 DIAGNOSIS — Z01812 Encounter for preprocedural laboratory examination: Secondary | ICD-10-CM | POA: Diagnosis not present

## 2019-01-25 DIAGNOSIS — I4891 Unspecified atrial fibrillation: Secondary | ICD-10-CM | POA: Diagnosis not present

## 2019-01-25 DIAGNOSIS — X58XXXA Exposure to other specified factors, initial encounter: Secondary | ICD-10-CM | POA: Diagnosis not present

## 2019-01-25 DIAGNOSIS — Z79899 Other long term (current) drug therapy: Secondary | ICD-10-CM | POA: Diagnosis not present

## 2019-01-25 DIAGNOSIS — G4730 Sleep apnea, unspecified: Secondary | ICD-10-CM | POA: Diagnosis not present

## 2019-01-25 DIAGNOSIS — M94262 Chondromalacia, left knee: Secondary | ICD-10-CM | POA: Diagnosis not present

## 2019-01-25 DIAGNOSIS — Z85828 Personal history of other malignant neoplasm of skin: Secondary | ICD-10-CM | POA: Diagnosis not present

## 2019-01-25 DIAGNOSIS — B20 Human immunodeficiency virus [HIV] disease: Secondary | ICD-10-CM | POA: Diagnosis not present

## 2019-01-25 DIAGNOSIS — M1990 Unspecified osteoarthritis, unspecified site: Secondary | ICD-10-CM | POA: Diagnosis not present

## 2019-01-25 DIAGNOSIS — I10 Essential (primary) hypertension: Secondary | ICD-10-CM | POA: Diagnosis not present

## 2019-01-25 DIAGNOSIS — S83242A Other tear of medial meniscus, current injury, left knee, initial encounter: Secondary | ICD-10-CM | POA: Diagnosis present

## 2019-01-25 LAB — BASIC METABOLIC PANEL
Anion gap: 9 (ref 5–15)
BUN: 18 mg/dL (ref 6–20)
CO2: 25 mmol/L (ref 22–32)
Calcium: 9.1 mg/dL (ref 8.9–10.3)
Chloride: 103 mmol/L (ref 98–111)
Creatinine, Ser: 1.03 mg/dL (ref 0.61–1.24)
GFR calc Af Amer: >60 mL/min (ref >60)
GFR calc non Af Amer: >60 mL/min (ref >60)
Glucose, Bld: 162 mg/dL — ABNORMAL HIGH (ref 70–99)
Potassium: 4.3 mmol/L (ref 3.5–5.1)
Sodium: 137 mmol/L (ref 135–145)

## 2019-01-25 NOTE — Progress Notes (Signed)
Ensure pre surgery drink given with instructions, pt verbalized understanding.

## 2019-01-27 ENCOUNTER — Encounter (HOSPITAL_BASED_OUTPATIENT_CLINIC_OR_DEPARTMENT_OTHER): Payer: Self-pay | Admitting: *Deleted

## 2019-01-27 ENCOUNTER — Ambulatory Visit (HOSPITAL_BASED_OUTPATIENT_CLINIC_OR_DEPARTMENT_OTHER): Payer: 59 | Admitting: Certified Registered"

## 2019-01-27 ENCOUNTER — Other Ambulatory Visit: Payer: Self-pay

## 2019-01-27 ENCOUNTER — Encounter (HOSPITAL_BASED_OUTPATIENT_CLINIC_OR_DEPARTMENT_OTHER)
Admission: RE | Disposition: A | Discharge status: Home / Self Care | Payer: Self-pay | Source: Home / Self Care | Attending: Orthopaedic Surgery

## 2019-01-27 ENCOUNTER — Ambulatory Visit (HOSPITAL_BASED_OUTPATIENT_CLINIC_OR_DEPARTMENT_OTHER)
Admission: RE | Admit: 2019-01-27 | Discharge: 2019-01-27 | Disposition: A | Discharge status: Home / Self Care | Payer: 59 | Attending: Orthopaedic Surgery | Admitting: Orthopaedic Surgery

## 2019-01-27 DIAGNOSIS — S83242D Other tear of medial meniscus, current injury, left knee, subsequent encounter: Secondary | ICD-10-CM

## 2019-01-27 DIAGNOSIS — Z85828 Personal history of other malignant neoplasm of skin: Secondary | ICD-10-CM | POA: Insufficient documentation

## 2019-01-27 DIAGNOSIS — S83242A Other tear of medial meniscus, current injury, left knee, initial encounter: Secondary | ICD-10-CM | POA: Diagnosis not present

## 2019-01-27 DIAGNOSIS — I10 Essential (primary) hypertension: Secondary | ICD-10-CM | POA: Insufficient documentation

## 2019-01-27 DIAGNOSIS — G4730 Sleep apnea, unspecified: Secondary | ICD-10-CM | POA: Insufficient documentation

## 2019-01-27 DIAGNOSIS — X58XXXA Exposure to other specified factors, initial encounter: Secondary | ICD-10-CM | POA: Insufficient documentation

## 2019-01-27 DIAGNOSIS — I4891 Unspecified atrial fibrillation: Secondary | ICD-10-CM | POA: Insufficient documentation

## 2019-01-27 DIAGNOSIS — Z7901 Long term (current) use of anticoagulants: Secondary | ICD-10-CM | POA: Insufficient documentation

## 2019-01-27 DIAGNOSIS — M94262 Chondromalacia, left knee: Secondary | ICD-10-CM | POA: Insufficient documentation

## 2019-01-27 DIAGNOSIS — Z79899 Other long term (current) drug therapy: Secondary | ICD-10-CM | POA: Insufficient documentation

## 2019-01-27 DIAGNOSIS — B20 Human immunodeficiency virus [HIV] disease: Secondary | ICD-10-CM | POA: Insufficient documentation

## 2019-01-27 DIAGNOSIS — M1990 Unspecified osteoarthritis, unspecified site: Secondary | ICD-10-CM | POA: Insufficient documentation

## 2019-01-27 DIAGNOSIS — Z01812 Encounter for preprocedural laboratory examination: Secondary | ICD-10-CM | POA: Insufficient documentation

## 2019-01-27 HISTORY — PX: KNEE ARTHROSCOPY WITH MEDIAL MENISECTOMY: SHX5651

## 2019-01-27 SURGERY — ARTHROSCOPY, KNEE, WITH MEDIAL MENISCECTOMY
Anesthesia: General | Site: Knee | Laterality: Left

## 2019-01-27 MED ORDER — ONDANSETRON HCL 4 MG/2ML IJ SOLN
INTRAMUSCULAR | Status: DC | PRN
  Administered 2019-01-27: 4 mg via INTRAVENOUS

## 2019-01-27 MED ORDER — CHLORHEXIDINE GLUCONATE 4 % EX LIQD: 60.0000 mL | Freq: Once | CUTANEOUS | Status: DC

## 2019-01-27 MED ORDER — SCOPOLAMINE 1 MG/3DAYS TD PT72: 1.0000 | MEDICATED_PATCH | Freq: Once | TRANSDERMAL | Status: DC

## 2019-01-27 MED ORDER — FENTANYL CITRATE (PF) 100 MCG/2ML IJ SOLN
50.0000 ug | INTRAMUSCULAR | Status: DC | PRN
  Administered 2019-01-27: 50 ug via INTRAVENOUS

## 2019-01-27 MED ORDER — CEFAZOLIN SODIUM-DEXTROSE 1-4 GM/50ML-% IV SOLN
INTRAVENOUS | Status: AC
  Filled 2019-01-27: qty 50

## 2019-01-27 MED ORDER — FENTANYL CITRATE (PF) 100 MCG/2ML IJ SOLN
INTRAMUSCULAR | Status: AC
  Filled 2019-01-27: qty 2

## 2019-01-27 MED ORDER — METOCLOPRAMIDE HCL 5 MG/ML IJ SOLN: 10.0000 mg | Freq: Once | INTRAMUSCULAR | Status: DC | PRN

## 2019-01-27 MED ORDER — LIDOCAINE HCL (CARDIAC) PF 100 MG/5ML IV SOSY
PREFILLED_SYRINGE | INTRAVENOUS | Status: DC | PRN
  Administered 2019-01-27: 100 mg via INTRAVENOUS

## 2019-01-27 MED ORDER — MIDAZOLAM HCL 2 MG/2ML IJ SOLN
1.0000 mg | INTRAMUSCULAR | Status: DC | PRN
  Administered 2019-01-27: 2 mg via INTRAVENOUS

## 2019-01-27 MED ORDER — PROPOFOL 10 MG/ML IV BOLUS
INTRAVENOUS | Status: DC | PRN
  Administered 2019-01-27: 200 mg via INTRAVENOUS

## 2019-01-27 MED ORDER — BUPIVACAINE HCL (PF) 0.25 % IJ SOLN
INTRAMUSCULAR | Status: DC | PRN
  Administered 2019-01-27: 25 mL

## 2019-01-27 MED ORDER — CEFAZOLIN SODIUM-DEXTROSE 2-4 GM/100ML-% IV SOLN
2.0000 g | INTRAVENOUS | Status: AC
  Administered 2019-01-27: 3 g via INTRAVENOUS

## 2019-01-27 MED ORDER — DEXAMETHASONE SODIUM PHOSPHATE 10 MG/ML IJ SOLN
INTRAMUSCULAR | Status: AC
  Filled 2019-01-27: qty 1

## 2019-01-27 MED ORDER — PROPOFOL 10 MG/ML IV BOLUS
INTRAVENOUS | Status: AC
  Filled 2019-01-27: qty 20

## 2019-01-27 MED ORDER — FENTANYL CITRATE (PF) 100 MCG/2ML IJ SOLN: 25.0000 ug | INTRAMUSCULAR | Status: DC | PRN

## 2019-01-27 MED ORDER — LACTATED RINGERS IV SOLN: INTRAVENOUS | Status: DC

## 2019-01-27 MED ORDER — LIDOCAINE 2% (20 MG/ML) 5 ML SYRINGE
INTRAMUSCULAR | Status: AC
  Filled 2019-01-27: qty 5

## 2019-01-27 MED ORDER — PROMETHAZINE HCL 25 MG PO TABS: 25.0000 mg | ORAL_TABLET | Freq: Four times a day (QID) | ORAL | 1 refills | Status: DC | PRN

## 2019-01-27 MED ORDER — HYDROCODONE-ACETAMINOPHEN 5-325 MG PO TABS: 1.0000 | ORAL_TABLET | Freq: Three times a day (TID) | ORAL | 0 refills | Status: DC | PRN

## 2019-01-27 MED ORDER — KETOROLAC TROMETHAMINE 30 MG/ML IJ SOLN
INTRAMUSCULAR | Status: DC | PRN
  Administered 2019-01-27: 30 mg via INTRAVENOUS

## 2019-01-27 MED ORDER — MEPERIDINE HCL 25 MG/ML IJ SOLN: 6.2500 mg | INTRAMUSCULAR | Status: DC | PRN

## 2019-01-27 MED ORDER — KETOROLAC TROMETHAMINE 30 MG/ML IJ SOLN
INTRAMUSCULAR | Status: AC
  Filled 2019-01-27: qty 1

## 2019-01-27 MED ORDER — MIDAZOLAM HCL 2 MG/2ML IJ SOLN
INTRAMUSCULAR | Status: AC
  Filled 2019-01-27: qty 2

## 2019-01-27 MED ORDER — CEFAZOLIN SODIUM-DEXTROSE 2-4 GM/100ML-% IV SOLN
INTRAVENOUS | Status: AC
  Filled 2019-01-27: qty 100

## 2019-01-27 MED ORDER — LACTATED RINGERS IV SOLN
INTRAVENOUS | Status: DC
  Administered 2019-01-27: 13:00:00 via INTRAVENOUS

## 2019-01-27 MED ORDER — DEXAMETHASONE SODIUM PHOSPHATE 4 MG/ML IJ SOLN
INTRAMUSCULAR | Status: DC | PRN
  Administered 2019-01-27: 10 mg via INTRAVENOUS

## 2019-01-27 MED ORDER — LACTATED RINGERS IV SOLN
INTRAVENOUS | Status: DC
  Administered 2019-01-27: 10:00:00 via INTRAVENOUS

## 2019-01-27 MED ORDER — SODIUM CHLORIDE 0.9 % IR SOLN
Status: DC | PRN
  Administered 2019-01-27: 3000 mL

## 2019-01-27 SURGICAL SUPPLY — 40 items
BANDAGE ESMARK 6X9 LF (GAUZE/BANDAGES/DRESSINGS) IMPLANT
BLADE CUDA GRT WHITE 3.5 (BLADE) IMPLANT
BLADE CUDA SHAVER 3.5 (BLADE) IMPLANT
BLADE CUTTER GATOR 3.5 (BLADE) IMPLANT
BLADE GREAT WHITE 4.2 (BLADE) IMPLANT
BLADE LANZA CVD 15 DEG (BLADE) IMPLANT
BNDG CMPR 9X6 STRL LF SNTH (GAUZE/BANDAGES/DRESSINGS)
BNDG ELASTIC 6X5.8 VLCR STR LF (GAUZE/BANDAGES/DRESSINGS) ×4 IMPLANT
BNDG ESMARK 6X9 LF (GAUZE/BANDAGES/DRESSINGS)
COVER WAND RF STERILE (DRAPES) IMPLANT
CUFF TOURN SGL QUICK 34 (TOURNIQUET CUFF) ×2
CUFF TRNQT CYL 34X4.125X (TOURNIQUET CUFF) ×1 IMPLANT
DRAPE ARTHROSCOPY W/POUCH 90 (DRAPES) ×2 IMPLANT
DRAPE IMP U-DRAPE 54X76 (DRAPES) ×2 IMPLANT
DRAPE U-SHAPE 47X51 STRL (DRAPES) ×2 IMPLANT
DURAPREP 26ML APPLICATOR (WOUND CARE) ×2 IMPLANT
GAUZE SPONGE 4X4 12PLY STRL (GAUZE/BANDAGES/DRESSINGS) ×2 IMPLANT
GAUZE XEROFORM 1X8 LF (GAUZE/BANDAGES/DRESSINGS) ×2 IMPLANT
GLOVE BIOGEL PI IND STRL 7.0 (GLOVE) ×1 IMPLANT
GLOVE BIOGEL PI INDICATOR 7.0 (GLOVE) ×1
GLOVE ECLIPSE 7.0 STRL STRAW (GLOVE) ×2 IMPLANT
GLOVE SKINSENSE NS SZ7.5 (GLOVE) ×1
GLOVE SKINSENSE STRL SZ7.5 (GLOVE) ×1 IMPLANT
GLOVE SURG SYN 7.5  E (GLOVE) ×1
GLOVE SURG SYN 7.5 E (GLOVE) ×1 IMPLANT
GLOVE SURG SYN 7.5 PF PI (GLOVE) ×1 IMPLANT
GOWN STRL REIN XL XLG (GOWN DISPOSABLE) ×2 IMPLANT
GOWN STRL REUS W/ TWL LRG LVL3 (GOWN DISPOSABLE) ×1 IMPLANT
GOWN STRL REUS W/ TWL XL LVL3 (GOWN DISPOSABLE) ×1 IMPLANT
GOWN STRL REUS W/TWL LRG LVL3 (GOWN DISPOSABLE) ×2
GOWN STRL REUS W/TWL XL LVL3 (GOWN DISPOSABLE) ×2
KNEE WRAP E Z 3 GEL PACK (MISCELLANEOUS) ×2 IMPLANT
MANIFOLD NEPTUNE II (INSTRUMENTS) ×2 IMPLANT
PACK ARTHROSCOPY DSU (CUSTOM PROCEDURE TRAY) ×2 IMPLANT
PACK BASIN DAY SURGERY FS (CUSTOM PROCEDURE TRAY) ×2 IMPLANT
RESECTOR FULL RADIUS 4.2MM (BLADE) IMPLANT
SHAVER 4.2 MM LANZA 9391A (BLADE) ×2 IMPLANT
SUT ETHILON 3 0 PS 1 (SUTURE) ×2 IMPLANT
TOWEL GREEN STERILE FF (TOWEL DISPOSABLE) ×2 IMPLANT
TUBING ARTHRO INFLOW-ONLY STRL (TUBING) ×2 IMPLANT

## 2019-01-27 NOTE — Op Note (Signed)
° °  Surgery Date: 01/27/2019  Surgeon(s): Leandrew Koyanagi, MD  ASSIST: Madalyn Rob, [place]; necessary for the timely completion of procedure and due to complexity of procedure.  ANESTHESIA:  general  FLUIDS: Per anesthesia record.   ESTIMATED BLOOD LOSS: minimal  PREOPERATIVE DIAGNOSES:  1.  Left knee medial meniscus tear 2.  Left knee synovitis 3.  Left medial femoral condyle chondromalacia  POSTOPERATIVE DIAGNOSES:  same  PROCEDURES PERFORMED:  1.  Left knee arthroscopy with major synovectomy 2.  Left knee arthroscopy with arthroscopic partial medial meniscectomy 3.  Left knee arthroscopy with arthroscopic chondroplasty medial femoral condyle.   DESCRIPTION OF PROCEDURE: Mr. Gillen is a 58 y.o.-year-old male with left knee medial meniscus tear. Plans are to proceed with partial medial meniscectomy and diagnostic arthroscopy with debridement as indicated. Full discussion held regarding risks benefits alternatives and complications related surgical intervention. Conservative care options reviewed. All questions answered.  The patient was identified in the preoperative holding area and the operative extremity was marked. The patient was brought to the operating room and transferred to operating table in a supine position. Satisfactory general anesthesia was induced by anesthesiology.    Standard anterolateral, anteromedial arthroscopy portals were obtained. The anteromedial portal was obtained with a spinal needle for localization under direct visualization with subsequent diagnostic findings.   Incisions were made for an arthroscopy portals.  Diagnostic knee arthroscopy was performed as well as a major synovectomy in all 3 compartments with an oscillating shaver.  Once this was done we then placed the knee in a valgus stress fashion to address the medial compartment.  There was widespread grade III chondromalacia of the weightbearing surface of the medial femoral condyle.  The  posterior horn the medial meniscus was probed and a radial tear was identified that had a displaced component that was above the meniscal root.  A partial medial meniscectomy was performed using a meniscus basket and oscillating shaver back to stable border.  Less than 50% of the meniscal volume was left after the partial meniscectomy.  Chondroplasty was then performed for the medial femoral condyle.  The lateral compartment was unremarkable.  The patellofemoral compartment exhibited some grade I-II chondromalacia mainly at the apex of the patella.  No loose bodies.  Excess fluid was removed from the knee joint.  Incisions were closed with interrupted nylon sutures.  Sterile dressings were applied.  Patient tolerated procedure well had no new complications.  Suprapatellar pouch and gutters: mild synovitis or debris. Patella chondral surface: Grade 1 Trochlear chondral surface: Grade 0 Patellofemoral tracking: normal Medial meniscus: posterior horn tear.  Medial femoral condyle weight bearing surface: Grade 3 Medial tibial plateau: Grade 2 Anterior cruciate ligament:stable Posterior cruciate ligament:stable Lateral meniscus: normal.   Lateral femoral condyle weight bearing surface: Grade 0 Lateral tibial plateau: Grade 0  DISPOSITION: The patient was awakened from general anesthetic, extubated, taken to the recovery room in medically stable condition, no apparent complications. The patient may be weightbearing as tolerated to the operative lower extremity.  Range of motion of right knee as tolerated.  Azucena Cecil, MD [phone] 12:35 PM

## 2019-01-27 NOTE — H&P (Signed)
PREOPERATIVE H&P  Chief Complaint: left knee medial meniscal tear  HPI: Ethan Lambert is a 58 y.o. male who presents for surgical treatment of left knee medial meniscal tear.  He denies any changes in medical history.  Past Medical History:  Diagnosis Date  . Anemia   . Arthritis    "probably right ring finger joint" (05/28/2018)  . Childhood asthma    as child none now  . CVA (cerebral vascular accident) ([place]) 1999 X 2   LEFT FRONTAL & OCCIPITAL , NON-HEMORRHAGIC; no residual  . Family history of malignant neoplasm of gastrointestinal tract   . Heart murmur   . History of blood transfusion 1999   "related to the stroke"  . History of kidney stones   . HIV disease ([place]) dx 1999   MONITORED BY INFECTIOUS DISEASE -- DR [place]  . Hypertension   . Paroxysmal atrial fibrillation (HCC)    CARDIOLOGIST--  DR Rayann Heman  . Right knee meniscal tear   . Skin cancer    skin  . Sleep apnea    "didn't follow thru w/mask" (05/28/2018)   Past Surgical History:  Procedure Laterality Date  . APPENDECTOMY  06-09-2003  . ATRIAL FIBRILLATION ABLATION  05/28/2018  . ATRIAL FIBRILLATION ABLATION N/A 05/28/2018   Procedure: ATRIAL FIBRILLATION ABLATION;  Surgeon: Thompson Grayer, MD;  Location: [facility];  Service: Cardiovascular;  Laterality: N/A;  . BACK SURGERY    . INGUINAL HERNIA REPAIR Bilateral 1990s  . KNEE ARTHROSCOPY WITH MEDIAL MENISECTOMY Right 12/31/2013   Procedure: RIGHT KNEE ARTHROSCOPY PARTIAL MEDIAL MENISCECTOMY DEBRIDEMENT AND CHONDROPLASTY ;  Surgeon: Sydnee Cabal, MD;  Location: [facility];  Service: Orthopedics;  Laterality: Right;  . LAPAROSCOPIC CHOLECYSTECTOMY  07-08-2003  . LASER ABLATION ANAL CONDYLOMA  05-18-2001  . LUMBAR LAMINECTOMY/DECOMPRESSION MICRODISCECTOMY Left 03/15/2015   Procedure: Left Lumbar five- sacral one microdiskectomy;  Surgeon: Consuella Lose, MD;  Location: [facility];  Service: Neurosurgery;  Laterality: Left;   Left L5S1 microdiskectomy  . NEGATIVE SLEEP STUDY  01-03-2012  in epic  . SKIN CANCER EXCISION Right    "cut it off my shoulder"  . TRANSTHORACIC ECHOCARDIOGRAM  12-01-2008   MODERATE LVH/  EF 55-60%/  MILD MR/  NEGATIVE BUBBLE STUDY   Social History   Socioeconomic History  . Marital status: Married    Spouse name: Not on file  . Number of children: 1  . Years of education: Not on file  . Highest education level: Not on file  Occupational History  . 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 Financial resource strain: Not on file  . Food insecurity    Worry: Not on file    Inability: Not on file  . Transportation needs    Medical: Not on file    Non-medical: Not on file  Tobacco Use  . Smoking status: Never Smoker  . Smokeless tobacco: Never Used  Substance and Sexual Activity  . Alcohol use: Yes    Alcohol/week: 0.0 standard drinks    Comment: 05/28/2018 "rarely; 1-2 per month"  . Drug use: Never  . Sexual activity: Yes    Partners: Male  Lifestyle  . Physical activity    Days per week: Not on file    Minutes per session: Not on file  . Stress: Not on file  Relationships  . Social Herbalist on phone: Not on file    Gets together:  Not on file    Attends religious service: Not on file    Active member of club or organization: Not on file    Attends meetings of clubs or organizations: Not on file    Relationship status: Not on file  Other Topics Concern  . Not on file  Social History Narrative   Pt lives in [place].  Unemployed.   Family History  Problem Relation Age of Onset  . Arrhythmia Father   . Heart attack Brother   . Prostate cancer Maternal Grandfather   . Colon cancer Maternal Grandfather   . Lung cancer Mother        smoker  . Hypertension Mother   . Hyperlipidemia Mother   . Other Maternal Grandmother        harding of the arteries   Allergies  Allergen Reactions  . Morphine Other (See  Comments)    hallucinations  . Watermelon [Citrullus Vulgaris] Rash   Prior to Admission medications   Medication Sig Start Date End Date Taking? Authorizing Provider  apixaban (ELIQUIS) 5 MG TABS tablet Take 1 tablet (5 mg total) by mouth 2 (two) times daily. 10/09/18  Yes Allred, Jeneen Rinks, MD  diltiazem (CARDIZEM CD) 240 MG 24 hr capsule TAKE 1 CAPSULE BY MOUTH  DAILY 07/21/18  Yes Allred, Jeneen Rinks, MD  hydrOXYzine (ATARAX/VISTARIL) 25 MG tablet Take 0.5-1 tablets (12.5-25 mg total) by mouth every 8 (eight) hours as needed for itching. 01/08/19  Yes Jaynee Eagles, PA-C  predniSONE (DELTASONE) 20 MG tablet Day 1-5: Take 3 tablets daily. Day 6-10: Take 2 tablets daily. Day 11-15: Take 1 tablet daily. Take tablets with breakfast. 01/08/19  Yes Jaynee Eagles, PA-C  TRIUMEQ 600-50-300 MG tablet TAKE 1 TABLET BY MOUTH  EVERY DAY 12/23/18  Yes Comer, Okey Regal, MD  metoprolol succinate (TOPROL XL) 25 MG 24 hr tablet Take 0.5 tablets (12.5 mg total) by mouth daily. 06/22/18 10/21/18  Sherran Needs, NP     Positive ROS: All other systems have been reviewed and were otherwise negative with the exception of those mentioned in the HPI and as above.  Physical Exam: General: Alert, no acute distress Cardiovascular: No pedal edema Respiratory: No cyanosis, no use of accessory musculature GI: abdomen soft Skin: No lesions in the area of chief complaint Neurologic: Sensation intact distally Psychiatric: Patient is competent for consent with normal mood and affect Lymphatic: no lymphedema  MUSCULOSKELETAL: exam stable  Assessment: left knee medial meniscal tear  Plan: Plan for Procedure(s): LEFT KNEE ARTHROSCOPY WITH PARTIAL MEDIAL MENISCECTOMY  The risks benefits and alternatives were discussed with the patient including but not limited to the risks of nonoperative treatment, versus surgical intervention including infection, bleeding, nerve injury,  blood clots, cardiopulmonary complications, morbidity,  mortality, among others, and they were willing to proceed.   Eduard Roux, MD   01/27/2019 8:25 AM

## 2019-01-27 NOTE — Anesthesia Procedure Notes (Signed)
Procedure Name: LMA Insertion Performed by: Maryella Shivers, CRNA Pre-anesthesia Checklist: Patient identified, Emergency Drugs available, Suction available, Patient being monitored and Timeout performed Patient Re-evaluated:Patient Re-evaluated prior to induction Oxygen Delivery Method: Circle system utilized Preoxygenation: Pre-oxygenation with 100% oxygen Induction Type: IV induction LMA: LMA inserted LMA Size: 5.0 Tube type: Oral Number of attempts: 1 Tube secured with: Tape Dental Injury: Teeth and Oropharynx as per pre-operative assessment

## 2019-01-27 NOTE — Discharge Instructions (Signed)

## 2019-01-27 NOTE — Anesthesia Postprocedure Evaluation (Signed)
Anesthesia Post Note  Patient: Ethan Lambert  Procedure(s) Performed: LEFT KNEE ARTHROSCOPY WITH PARTIAL MEDIAL MENISCECTOMY (Left Knee)     Patient location during evaluation: PACU Anesthesia Type: General Level of consciousness: awake and alert Pain management: pain level controlled Vital Signs Assessment: post-procedure vital signs reviewed and stable Respiratory status: spontaneous breathing, nonlabored ventilation, respiratory function stable and patient connected to nasal cannula oxygen Cardiovascular status: blood pressure returned to baseline and stable Postop Assessment: no apparent nausea or vomiting Anesthetic complications: no    Last Vitals:  Vitals:   01/27/19 1250 01/27/19 1300  BP: (!) 148/58 (!) 142/85  Pulse: 70 72  Resp: 16 16  Temp: 36.7 C   SpO2: 95% 97%    Last Pain:  Vitals:   01/27/19 1300  TempSrc:   PainSc: 0-No pain                 Montez Hageman

## 2019-01-27 NOTE — Transfer of Care (Signed)
Immediate Anesthesia Transfer of Care Note  Patient: Ethan Lambert  Procedure(s) Performed: LEFT KNEE ARTHROSCOPY WITH PARTIAL MEDIAL MENISCECTOMY (Left Knee)  Patient Location: PACU  Anesthesia Type:General  Level of Consciousness: awake, sedated and patient cooperative  Airway & Oxygen Therapy: Patient Spontanous Breathing and Patient connected to face mask oxygen  Post-op Assessment: Report given to RN and Post -op Vital signs reviewed and stable  Post vital signs: Reviewed and stable  Last Vitals:  Vitals Value Taken Time  BP 148/85 01/27/19 1250  Temp    Pulse 81 01/27/19 1254  Resp 17 01/27/19 1254  SpO2 95 % 01/27/19 1254  Vitals shown include unvalidated device data.  Last Pain:  Vitals:   01/27/19 1016  TempSrc: Oral  PainSc: 0-No pain      Patients Stated Pain Goal: 0 (54/49/20 1007)  Complications: No apparent anesthesia complications

## 2019-01-27 NOTE — Anesthesia Preprocedure Evaluation (Signed)
Anesthesia Evaluation  Patient identified by MRN, date of birth, ID band Patient awake    Reviewed: Allergy & Precautions, NPO status , Patient's Chart, lab work & pertinent test results  History of Anesthesia Complications Negative for: history of anesthetic complications  Airway Mallampati: II  TM Distance: >3 FB Neck ROM: Full    Dental  (+) Dental Advisory Given, Missing,    Pulmonary sleep apnea ,    Pulmonary exam normal breath sounds clear to auscultation       Cardiovascular hypertension, Pt. on home beta blockers and Pt. on medications Normal cardiovascular exam+ dysrhythmias Atrial Fibrillation  Rhythm:Regular Rate:Normal  TTE 2017: Normal LV size with mild LV hypertrophy. EF 55-60%. Normal diastolic function. Normal RV size and systolic function.   Neuro/Psych CVA, No Residual Symptoms    GI/Hepatic negative GI ROS, Neg liver ROS,   Endo/Other  negative endocrine ROS  Renal/GU negative Renal ROS     Musculoskeletal  (+) Arthritis , Osteoarthritis,    Abdominal   Peds  Hematology  (+) HIV,   Anesthesia Other Findings   Reproductive/Obstetrics                             Anesthesia Physical  Anesthesia Plan  ASA: II  Anesthesia Plan: General   Post-op Pain Management:    Induction: Intravenous  PONV Risk Score and Plan: 2 and Treatment may vary due to age or medical condition, Ondansetron, Dexamethasone and Midazolam  Airway Management Planned: LMA  Additional Equipment:   Intra-op Plan:   Post-operative Plan:   Informed Consent:     Dental advisory given  Plan Discussed with: CRNA, Anesthesiologist and Surgeon  Anesthesia Plan Comments:         Anesthesia Quick Evaluation

## 2019-01-28 ENCOUNTER — Encounter (HOSPITAL_BASED_OUTPATIENT_CLINIC_OR_DEPARTMENT_OTHER): Payer: Self-pay | Admitting: Orthopaedic Surgery

## 2019-02-03 ENCOUNTER — Encounter: Payer: Self-pay | Admitting: Orthopaedic Surgery

## 2019-02-03 ENCOUNTER — Ambulatory Visit (INDEPENDENT_AMBULATORY_CARE_PROVIDER_SITE_OTHER): Payer: 59 | Admitting: Orthopaedic Surgery

## 2019-02-03 DIAGNOSIS — S83242D Other tear of medial meniscus, current injury, left knee, subsequent encounter: Secondary | ICD-10-CM

## 2019-02-03 NOTE — Progress Notes (Addendum)
Patient ID: KAYSHAWN OZBURN, male   DOB: [birth year], 58 y.o.   MRN: [id number]  Braelin is 1 week status post left knee arthroscopy and partial medial meniscectomy and chondroplasty.  He is overall doing well.  Not take any pain medicines.  Ambulating without a single-point cane.  Incisions are healed without any signs of infection.  He is able to reach 90 degrees of flexion.  No calf tenderness.  No bruising.  Today we remove the sutures in place Steri-Strips.  We will go ahead and give him exercises to do at home to help range of motion and quad strengthening ambulation.  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  We will see him back in 1 month for recheck.  Needs custom brace due to thigh to calf ratio.

## 2019-02-26 ENCOUNTER — Other Ambulatory Visit: Payer: Self-pay | Admitting: Internal Medicine

## 2019-03-02 NOTE — Telephone Encounter (Signed)
Age 58, weight 124kg, SCr 1.03 on 01/25/19, last OV April 2020, afib indication

## 2019-03-03 ENCOUNTER — Encounter: Payer: Self-pay | Admitting: Orthopaedic Surgery

## 2019-03-03 ENCOUNTER — Ambulatory Visit (INDEPENDENT_AMBULATORY_CARE_PROVIDER_SITE_OTHER): Payer: 59 | Admitting: Orthopaedic Surgery

## 2019-03-03 VITALS — Ht 73.0 in | Wt 273.0 lb

## 2019-03-03 DIAGNOSIS — S83242D Other tear of medial meniscus, current injury, left knee, subsequent encounter: Secondary | ICD-10-CM

## 2019-03-03 NOTE — Progress Notes (Signed)
Post-Op Visit Note   Patient: Ethan Lambert           Date of Birth: 27-Oct-1960           MRN: [id number] Visit Date: 03/03/2019 PCP: Patient, No Pcp Per   Assessment & Plan:  Chief Complaint:  Chief Complaint  Patient presents with  . Left Knee - Routine Post Op    01/27/19 Left knee arthroscopy with partial medial meniscectomy    Visit Diagnoses:  1. Acute medial meniscus tear, left, subsequent encounter     Plan: Sudeys is approximately 1 month status post left knee arthroscopy and partial medial meniscectomy.  He is overall doing well.  He has been wearing the unloader brace.  He states that the pain is controlled.  He mainly has trouble using stairs.  He occasionally will have a sharp stabbing pain.  He is still concerned he is not ready to return back to work.  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 On physical exam he has a small joint effusion.  He has good range of motion.  He has a well fitting unloader brace. From my standpoint the patient is improving but slowly.  I feel that he may benefit from formal outpatient physical therapy for strengthening so that he can safely return back to work.  He is to continue to wear the unloader brace during activity.  He is on Eliquis therefore he cannot take NSAIDs.  He will continue to ice as much as possible.  Continue to work for another month so that he can get more strengthening.  Recheck in a month.  Follow-Up Instructions: Return in about 4 weeks (around 03/31/2019).   Orders:  Orders Placed This Encounter  Procedures  . Ambulatory referral to Physical Therapy   No orders of the defined types were placed in this encounter.   Imaging: No results found.  PMFS History: Patient Active Problem List   Diagnosis Date Noted  . Acute medial meniscus tear, left, subsequent encounter 01/22/2019  . Rotator cuff syndrome of left shoulder 07/10/2018  . Paroxysmal atrial fibrillation  ([place]) 05/28/2018  . Subacromial bursitis of left shoulder joint 05/19/2018  . Screening examination for sexually transmitted disease 05/11/2018  . Need for prophylactic vaccination against Streptococcus pneumoniae (pneumococcus) 05/11/2018  . Medication monitoring encounter 02/12/2017  . Sprain of shoulder, right 10/01/2016  . Shoulder pain, right 09/26/2016  . Acromioclavicular joint arthritis 09/26/2016  . Encounter for long-term (current) use of medications 06/08/2015  . HNP (herniated nucleus pulposus), lumbar 03/15/2015  . Fatigue 01/26/2015  . S/P right knee arthroscopy 12/31/2013  . INGUINAL HERNIORRHAPHIES, BILATERAL, HX OF 01/15/2010  . VARICOCELE 07/05/2009  . HEMATOSPERMIA 06/27/2009  . Essential hypertension 12/08/2008  . Atrial fibrillation ([place]) 12/08/2008  . Cerebral artery occlusion with cerebral infarction ([place]) 12/08/2008  . Human immunodeficiency virus disease ([place]) 11/18/2006   Past Medical History:  Diagnosis Date  . Anemia   . Arthritis    "probably right ring finger joint" (05/28/2018)  . Childhood asthma    as child none now  . CVA (cerebral vascular accident) ([place]) 1999 X 2   LEFT FRONTAL & OCCIPITAL , NON-HEMORRHAGIC; no residual  . Family history of malignant neoplasm of gastrointestinal tract   . Heart murmur   . History of blood transfusion 1999   "related to the stroke"  . History of kidney stones   . HIV disease ([place]) dx 1999   MONITORED BY  INFECTIOUS DISEASE -- DR Herbie [place] COMER  . Hypertension   . Paroxysmal atrial fibrillation (HCC)    CARDIOLOGIST--  DR Rayann Heman  . Right knee meniscal tear   . Skin cancer    skin  . Sleep apnea    "didn't follow thru w/mask" (05/28/2018)    Family History  Problem Relation Age of Onset  . Arrhythmia Father   . Heart attack Brother   . Prostate cancer Maternal Grandfather   . Colon cancer Maternal Grandfather   . Lung cancer Mother        smoker  . Hypertension Mother   . Hyperlipidemia Mother   .  Other Maternal Grandmother        harding of the arteries    Past Surgical History:  Procedure Laterality Date  . APPENDECTOMY  06-09-2003  . ATRIAL FIBRILLATION ABLATION  05/28/2018  . ATRIAL FIBRILLATION ABLATION N/A 05/28/2018   Procedure: ATRIAL FIBRILLATION ABLATION;  Surgeon: Thompson Grayer, MD;  Location: [facility];  Service: Cardiovascular;  Laterality: N/A;  . BACK SURGERY    . INGUINAL HERNIA REPAIR Bilateral 1990s  . KNEE ARTHROSCOPY WITH MEDIAL MENISECTOMY Right 12/31/2013   Procedure: RIGHT KNEE ARTHROSCOPY PARTIAL MEDIAL MENISCECTOMY DEBRIDEMENT AND CHONDROPLASTY ;  Surgeon: Sydnee Cabal, MD;  Location: [facility];  Service: Orthopedics;  Laterality: Right;  . KNEE ARTHROSCOPY WITH MEDIAL MENISECTOMY Left 01/27/2019   Procedure: LEFT KNEE ARTHROSCOPY WITH PARTIAL MEDIAL MENISCECTOMY;  Surgeon: Leandrew Koyanagi, MD;  Location: [facility];  Service: Orthopedics;  Laterality: Left;  . LAPAROSCOPIC CHOLECYSTECTOMY  07-08-2003  . LASER ABLATION ANAL CONDYLOMA  05-18-2001  . LUMBAR LAMINECTOMY/DECOMPRESSION MICRODISCECTOMY Left 03/15/2015   Procedure: Left Lumbar five- sacral one microdiskectomy;  Surgeon: Consuella Lose, MD;  Location: [facility];  Service: Neurosurgery;  Laterality: Left;  Left L5S1 microdiskectomy  . NEGATIVE SLEEP STUDY  01-03-2012  in epic  . SKIN CANCER EXCISION Right    "cut it off my shoulder"  . TRANSTHORACIC ECHOCARDIOGRAM  12-01-2008   MODERATE LVH/  EF 55-60%/  MILD MR/  NEGATIVE BUBBLE STUDY   Social History   Occupational History  . 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 Smoking status: Never Smoker  . Smokeless tobacco: Never Used  Substance and Sexual Activity  . Alcohol use: Yes    Alcohol/week: 0.0 standard drinks    Comment: 05/28/2018 "rarely; 1-2 per month"  . Drug use: Never  . Sexual activity: Yes    Partners: Male

## 2019-03-31 ENCOUNTER — Other Ambulatory Visit: Payer: Self-pay | Admitting: Internal Medicine

## 2019-05-11 ENCOUNTER — Other Ambulatory Visit: Payer: Self-pay

## 2019-05-11 ENCOUNTER — Ambulatory Visit (INDEPENDENT_AMBULATORY_CARE_PROVIDER_SITE_OTHER): Payer: 59 | Admitting: Orthopaedic Surgery

## 2019-05-11 ENCOUNTER — Encounter: Payer: Self-pay | Admitting: Orthopaedic Surgery

## 2019-05-11 DIAGNOSIS — S83242D Other tear of medial meniscus, current injury, left knee, subsequent encounter: Secondary | ICD-10-CM

## 2019-05-11 NOTE — Progress Notes (Signed)
Patient ID: Ethan Lambert, male   DOB: May 02, 1961, 58 y.o.   MRN: [id number]  Hazem is 104 days status post left knee arthroscopy partial medial meniscectomy.  He is doing well overall.  He completed 7 weeks of outpatient PT.  He is currently using Kinesiotape which really helps with his anterior knee pain especially with descending stairs.  Overall he is doing well he is very happy with his recovery.  He has regained full range of motion.  He still has some mild quadriceps weakness.  The surgical scars are fully healed.  At this point patient feels that he is ready to return back to work this Friday without restrictions.  Work note provided today.  Questions encouraged and answered.  Follow-up as needed.

## 2019-05-12 ENCOUNTER — Other Ambulatory Visit (HOSPITAL_COMMUNITY)
Admission: RE | Admit: 2019-05-12 | Discharge: 2019-05-12 | Disposition: A | Discharge status: Home / Self Care | Payer: 59 | Source: Ambulatory Visit | Attending: Internal Medicine | Admitting: Internal Medicine

## 2019-05-12 ENCOUNTER — Ambulatory Visit (INDEPENDENT_AMBULATORY_CARE_PROVIDER_SITE_OTHER): Payer: 59

## 2019-05-12 ENCOUNTER — Other Ambulatory Visit: Payer: 59

## 2019-05-12 DIAGNOSIS — Z23 Encounter for immunization: Secondary | ICD-10-CM | POA: Diagnosis not present

## 2019-05-12 DIAGNOSIS — Z79899 Other long term (current) drug therapy: Secondary | ICD-10-CM

## 2019-05-12 DIAGNOSIS — Z113 Encounter for screening for infections with a predominantly sexual mode of transmission: Secondary | ICD-10-CM | POA: Diagnosis present

## 2019-05-12 DIAGNOSIS — B20 Human immunodeficiency virus [HIV] disease: Secondary | ICD-10-CM

## 2019-05-13 ENCOUNTER — Telehealth: Payer: Self-pay | Admitting: Orthopaedic Surgery

## 2019-05-13 LAB — URINE CYTOLOGY ANCILLARY ONLY
Chlamydia: NEGATIVE
Comment: NEGATIVE
Comment: NORMAL
Neisseria Gonorrhea: NEGATIVE

## 2019-05-13 LAB — T-HELPER CELL (CD4) - (RCID CLINIC ONLY)
CD4 % Helper T Cell: 22 % — ABNORMAL LOW (ref 33–65)
CD4 T Cell Abs: 677 /uL (ref 400–1790)

## 2019-05-13 NOTE — Telephone Encounter (Signed)
Received call from [facility] from [employer] stating patient told her he was released to full duty. Maggie asked that a note stating this information be faxed to her. The fax# is [phone] The ph# is [phone]   Claim event # is [id number]

## 2019-05-13 NOTE — Telephone Encounter (Signed)
FAXED TO [phone] Evangeline Gula

## 2019-05-19 ENCOUNTER — Other Ambulatory Visit: Payer: Self-pay

## 2019-05-19 ENCOUNTER — Telehealth (INDEPENDENT_AMBULATORY_CARE_PROVIDER_SITE_OTHER): Payer: 59 | Admitting: Registered Nurse

## 2019-05-19 DIAGNOSIS — R509 Fever, unspecified: Secondary | ICD-10-CM | POA: Diagnosis not present

## 2019-05-19 DIAGNOSIS — Z20822 Contact with and (suspected) exposure to covid-19: Secondary | ICD-10-CM

## 2019-05-19 LAB — COMPLETE METABOLIC PANEL WITH GFR
AG Ratio: 1.4 (calc) (ref 1.0–2.5)
ALT: 37 U/L (ref 9–46)
AST: 37 U/L — ABNORMAL HIGH (ref 10–35)
Albumin: 4.3 g/dL (ref 3.6–5.1)
Alkaline phosphatase (APISO): 69 U/L (ref 35–144)
BUN: 15 mg/dL (ref 7–25)
CO2: 26 mmol/L (ref 20–32)
Calcium: 9.2 mg/dL (ref 8.6–10.3)
Chloride: 104 mmol/L (ref 98–110)
Creat: 0.98 mg/dL (ref 0.70–1.33)
GFR, Est African American: 98 mL/min/{1.73_m2} (ref >=60)
GFR, Est Non African American: 85 mL/min/{1.73_m2} (ref >=60)
Globulin: 3.1 g/dL (calc) (ref 1.9–3.7)
Glucose, Bld: 73 mg/dL (ref 65–99)
Potassium: 4.4 mmol/L (ref 3.5–5.3)
Sodium: 138 mmol/L (ref 135–146)
Total Bilirubin: 0.7 mg/dL (ref 0.2–1.2)
Total Protein: 7.4 g/dL (ref 6.1–8.1)

## 2019-05-19 LAB — CBC WITH DIFFERENTIAL/PLATELET
Absolute Monocytes: 896 cells/uL (ref 200–950)
Basophils Absolute: 56 cells/uL (ref 0–200)
Basophils Relative: 0.7 %
Eosinophils Absolute: 152 cells/uL (ref 15–500)
Eosinophils Relative: 1.9 %
HCT: 44.3 % (ref 38.5–50.0)
Hemoglobin: 15.2 g/dL (ref 13.2–17.1)
Lymphs Abs: 3200 cells/uL (ref 850–3900)
MCH: 31.4 pg (ref 27.0–33.0)
MCHC: 34.3 g/dL (ref 32.0–36.0)
MCV: 91.5 fL (ref 80.0–100.0)
MPV: 9.9 fL (ref 7.5–12.5)
Monocytes Relative: 11.2 %
Neutro Abs: 3696 cells/uL (ref 1500–7800)
Neutrophils Relative %: 46.2 %
Platelets: 268 10*3/uL (ref 140–400)
RBC: 4.84 10*6/uL (ref 4.20–5.80)
RDW: 13.4 % (ref 11.0–15.0)
Total Lymphocyte: 40 %
WBC: 8 10*3/uL (ref 3.8–10.8)

## 2019-05-19 LAB — LIPID PANEL
Cholesterol: 182 mg/dL (ref <200)
HDL: 28 mg/dL — ABNORMAL LOW (ref >=40)
LDL Cholesterol (Calc): 116 mg/dL (calc) — ABNORMAL HIGH
Non-HDL Cholesterol (Calc): 154 mg/dL (calc) — ABNORMAL HIGH (ref <130)
Total CHOL/HDL Ratio: 6.5 (calc) — ABNORMAL HIGH (ref <5.0)
Triglycerides: 267 mg/dL — ABNORMAL HIGH (ref <150)

## 2019-05-19 LAB — RPR: RPR Ser Ql: NONREACTIVE

## 2019-05-19 LAB — HIV-1 RNA QUANT-NO REFLEX-BLD
HIV 1 RNA Quant: <20 copies/mL
HIV-1 RNA Quant, Log: <1.3 Log copies/mL

## 2019-05-19 NOTE — Progress Notes (Signed)
Telemedicine Encounter- SOAP NOTE Established Patient  This telephone encounter was conducted with the patient's (or proxy's) verbal consent via audio telecommunications: yes  Patient was instructed to have this encounter in a suitably private space; and to only have persons present to whom they give permission to participate. In addition, patient identity was confirmed by use of name plus two identifiers (DOB and address).  I discussed the limitations, risks, security and privacy concerns of performing an evaluation and management service by telephone and the availability of in person appointments. I also discussed with the patient that there may be a patient responsible charge related to this service. The patient expressed understanding and agreed to proceed.  I spent a total of 12 minutes talking with the patient or their proxy.  No chief complaint on file.   Subjective   Ethan Lambert is a 58 y.o. established patient. Telephone visit today for low grade fever  HPI Pt reports he has noted a low grade fever for the past few days - he thought it might be a transient event, but it has stuck around. His appetite has diminished. 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 Previously, he had been home for five months after a knee surgery. Denies shob, cough, sinus pain or pressure, numbness, weakness, or tingling. States that he has had a mild, lingering headache.   Patient Active Problem List   Diagnosis Date Noted  . Acute medial meniscus tear, left, subsequent encounter 01/22/2019  . Rotator cuff syndrome of left shoulder 07/10/2018  . Paroxysmal atrial fibrillation ([place]) 05/28/2018  . Subacromial bursitis of left shoulder joint 05/19/2018  . Screening examination for sexually transmitted disease 05/11/2018  . Need for prophylactic vaccination against Streptococcus pneumoniae (pneumococcus) 05/11/2018  . Medication monitoring encounter 02/12/2017  . Sprain of shoulder, right  10/01/2016  . Shoulder pain, right 09/26/2016  . Acromioclavicular joint arthritis 09/26/2016  . Encounter for long-term (current) use of medications 06/08/2015  . HNP (herniated nucleus pulposus), lumbar 03/15/2015  . Fatigue 01/26/2015  . S/P right knee arthroscopy 12/31/2013  . INGUINAL HERNIORRHAPHIES, BILATERAL, HX OF 01/15/2010  . VARICOCELE 07/05/2009  . HEMATOSPERMIA 06/27/2009  . Essential hypertension 12/08/2008  . Atrial fibrillation ([place]) 12/08/2008  . Cerebral artery occlusion with cerebral infarction ([place]) 12/08/2008  . Human immunodeficiency virus disease ([place]) 11/18/2006    Past Medical History:  Diagnosis Date  . Anemia   . Arthritis    "probably right ring finger joint" (05/28/2018)  . Childhood asthma    as child none now  . CVA (cerebral vascular accident) ([place]) 1999 X 2   LEFT FRONTAL & OCCIPITAL , NON-HEMORRHAGIC; no residual  . Family history of malignant neoplasm of gastrointestinal tract   . Heart murmur   . History of blood transfusion 1999   "related to the stroke"  . History of kidney stones   . HIV disease ([place]) dx 1999   MONITORED BY INFECTIOUS DISEASE -- DR [place]  . Hypertension   . Paroxysmal atrial fibrillation (HCC)    CARDIOLOGIST--  DR Rayann Heman  . Right knee meniscal tear   . Skin cancer    skin  . Sleep apnea    "didn't follow thru w/mask" (05/28/2018)    Current Outpatient Medications  Medication Sig Dispense Refill  . diltiazem (CARDIZEM CD) 240 MG 24 hr capsule TAKE 1 CAPSULE BY MOUTH  DAILY 90 capsule 1  . ELIQUIS 5 MG TABS tablet TAKE 1 TABLET BY MOUTH  TWICE  DAILY 180 tablet 1  . TRIUMEQ 600-50-300 MG tablet TAKE 1 TABLET BY MOUTH  EVERY DAY 30 tablet 5   No current facility-administered medications for this visit.     Allergies  Allergen Reactions  . Morphine Other (See Comments)    hallucinations  . Watermelon [Citrullus Vulgaris] Rash    Social History   Socioeconomic History  . Marital status: Married     Spouse name: Not on file  . Number of children: 1  . Years of education: Not on file  . Highest education level: Not on file  Occupational History  . 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 Financial resource strain: Not on file  . Food insecurity    Worry: Not on file    Inability: Not on file  . Transportation needs    Medical: Not on file    Non-medical: Not on file  Tobacco Use  . Smoking status: Never Smoker  . Smokeless tobacco: Never Used  Substance and Sexual Activity  . Alcohol use: Yes    Alcohol/week: 0.0 standard drinks    Comment: 05/28/2018 "rarely; 1-2 per month"  . Drug use: Never  . Sexual activity: Yes    Partners: Male  Lifestyle  . Physical activity    Days per week: Not on file    Minutes per session: Not on file  . Stress: Not on file  Relationships  . Social Herbalist on phone: Not on file    Gets together: Not on file    Attends religious service: Not on file    Active member of club or organization: Not on file    Attends meetings of clubs or organizations: Not on file    Relationship status: Not on file  . Intimate partner violence    Fear of current or ex partner: Not on file    Emotionally abused: Not on file    Physically abused: Not on file    Forced sexual activity: Not on file  Other Topics Concern  . Not on file  Social History Narrative   Pt lives in [place].  Unemployed.    Review of Systems  Constitutional: Positive for fever and malaise/fatigue. Negative for chills, diaphoresis and weight loss.  HENT: Negative.   Eyes: Negative.   Respiratory: Negative.   Cardiovascular: Negative.   Gastrointestinal: Negative.   Genitourinary: Negative.   Musculoskeletal: Negative.   Skin: Negative.   Neurological: Positive for headaches. Negative for dizziness, tingling, sensory change, loss of consciousness and weakness.  Endo/Heme/Allergies: Negative.   Psychiatric/Behavioral:  Negative.     Objective   Vitals as reported by the patient: There were no vitals filed for this visit.  Diagnoses and all orders for this visit:  Fever, unspecified fever cause -     Cancel: Novel Coronavirus, NAA ([facility])   PLAN  Suggested to patient to proceed to drive through testing site and self isolate as best as possible until test results come in. He is supposed to return to work on Friday. I will write him a work note explaining the current situation.  He lives with a few people, including an 58 year old and a 58 year old. We discussed taking precautions to keep them safe - he is distancing himself from them in his home and trying to make sure they are exposed as little as possible   Reviewed signs and symptoms of covid and reasons to proceed to the  ED.  Patient encouraged to call clinic with any questions, comments, or concerns.    I discussed the assessment and treatment plan with the patient. The patient was provided an opportunity to ask questions and all were answered. The patient agreed with the plan and demonstrated an understanding of the instructions.   The patient was advised to call back or seek an in-person evaluation if the symptoms worsen or if the condition fails to improve as anticipated.  I provided 13 minutes of non-face-to-face time during this encounter.  Maximiano Coss, NP  Primary Care at [facility]

## 2019-05-19 NOTE — Progress Notes (Signed)
Pt has had a low grade fever for the past 2 days. Temp is being monitored at home and work, works [employer]. Says he has not had an appetite until today, was able to eat oatmeal. Denies loss of taste, or any other sx. Live with 58yo child and 58yo., wants to know status. Pt has been instructed to go to the drive thru testing site.

## 2019-05-21 ENCOUNTER — Ambulatory Visit: Payer: Self-pay | Admitting: *Deleted

## 2019-05-21 LAB — NOVEL CORONAVIRUS, NAA: SARS-CoV-2, NAA: DETECTED — AB

## 2019-05-21 NOTE — Telephone Encounter (Signed)
Patient received his COVID result on MyChart- And the patient has some questions. Please advise.   Patient states he sent his results for + COVID into work and they told him it was antibody test and to come in. Advised patient  He is + COVID. Patient was tested for exposure and symptoms: fever, chills, headache, diarrhea, nausea, loss taste , cough. Advised treat symptoms OTC, contact PCP for follow up, and go to ED for trouble breathing, dehydration, severe weakness. Advised isolation and safe precautions in the home reviewed. CDC criteria for ending isolation reviewed. Health dept notified. Patient does not have PCP- will contact Dr Linus Salmons- [facility] for letter stating he is indeed + COVID.  Reason for Disposition . Health Information question, no triage required and triager able to answer question  Protocols used: INFORMATION ONLY CALL - NO TRIAGE-A-AH

## 2019-05-24 ENCOUNTER — Telehealth: Payer: Self-pay | Admitting: *Deleted

## 2019-05-24 ENCOUNTER — Telehealth: Payer: Self-pay | Admitting: Nurse Practitioner

## 2019-05-24 NOTE — Telephone Encounter (Signed)
Yes, ok for a note.  Has a positive test.  thanks

## 2019-05-24 NOTE — Telephone Encounter (Signed)
Patient called to report that he tested positive for covid 05/19/19 and needs a note for work. Will send note to provider to see if he will provide a note for the patient.

## 2019-05-24 NOTE — Telephone Encounter (Signed)
Called to Discuss with patient about Covid symptoms and the use of bamlanivimab, a monoclonal antibody infusion for those with mild to moderate Covid symptoms and at a high risk of hospitalization.     Pt is qualified for this infusion at the [facility] infusion center due to co-morbid conditions and/or a member of an at-risk group.     Patient states that he is feeling much improved and declines infusion at this time.

## 2019-05-25 ENCOUNTER — Encounter: Payer: Self-pay | Admitting: *Deleted

## 2019-05-25 NOTE — Telephone Encounter (Signed)
Called the patient and advised him we will provide a note for him to be out of work for 10 from testing date at least and he will need to call us back at that time to get a note to go back to work, per [employer]. Patient advised he understands and will call us around 06/02/19 for release to go back to work. Patient also says he will get the note from his MyChart account for his employer.

## 2019-05-26 ENCOUNTER — Encounter: Payer: 59 | Admitting: Internal Medicine

## 2019-06-02 ENCOUNTER — Other Ambulatory Visit: Payer: Self-pay

## 2019-06-02 DIAGNOSIS — Z20822 Contact with and (suspected) exposure to covid-19: Secondary | ICD-10-CM

## 2019-06-04 LAB — NOVEL CORONAVIRUS, NAA: SARS-CoV-2, NAA: NOT DETECTED

## 2019-06-07 ENCOUNTER — Telehealth: Payer: Self-pay | Admitting: Registered Nurse

## 2019-06-07 NOTE — Telephone Encounter (Signed)
Pt requesting work note to return to work. Says that he is having on going symptoms like mild coughing, a little weakness.   Please Advise

## 2019-06-08 NOTE — Telephone Encounter (Signed)
Will discuss at upcoming appointment

## 2019-06-11 ENCOUNTER — Encounter: Payer: Self-pay | Admitting: Registered Nurse

## 2019-06-11 ENCOUNTER — Telehealth (INDEPENDENT_AMBULATORY_CARE_PROVIDER_SITE_OTHER): Payer: 59 | Admitting: Registered Nurse

## 2019-06-11 ENCOUNTER — Other Ambulatory Visit: Payer: Self-pay

## 2019-06-11 VITALS — Ht 73.0 in | Wt 266.0 lb

## 2019-06-11 DIAGNOSIS — R0602 Shortness of breath: Secondary | ICD-10-CM

## 2019-06-11 MED ORDER — ALBUTEROL SULFATE HFA 108 (90 BASE) MCG/ACT IN AERS: 2.0000 | INHALATION_SPRAY | Freq: Four times a day (QID) | RESPIRATORY_TRACT | 2 refills | Status: DC | PRN

## 2019-06-11 NOTE — Progress Notes (Signed)
Telemedicine Encounter- SOAP NOTE Established Patient  This telephone encounter was conducted with the patient's (or proxy's) verbal consent via audio telecommunications: yes  Patient was instructed to have this encounter in a suitably private space; and to only have persons present to whom they give permission to participate. In addition, patient identity was confirmed by use of name plus two identifiers (DOB and address).  I discussed the limitations, risks, security and privacy concerns of performing an evaluation and management service by telephone and the availability of in person appointments. I also discussed with the patient that there may be a patient responsible charge related to this service. The patient expressed understanding and agreed to proceed.  I spent a total of 16 minutes talking with the patient or their proxy.  Chief Complaint  Patient presents with  . Follow-up    pt stated---thightness in chest when walking/going up step,,  fatigue... denied fever.    Subjective   Ethan Lambert is a 58 y.o. established patient. Telephone visit today for COVID follow up, and visit to establish care.  HPI Reports that he is feeling much improved since our last conversation. Was able to get a follow up test - negative - and return to work on Wednesday. Noticing some residual shob and some band like tightness across his chest on exertion - does not believe them to be cv symptoms. Suspects effects of COVID. Wishes to discuss what the recovery looks like  Notes that he needs a work note for the dates of his absence d/t COVID. This will be provided.   Patient Active Problem List   Diagnosis Date Noted  . Acute medial meniscus tear, left, subsequent encounter 01/22/2019  . Rotator cuff syndrome of left shoulder 07/10/2018  . Paroxysmal atrial fibrillation ([place]) 05/28/2018  . Subacromial bursitis of left shoulder joint 05/19/2018  . Screening examination for sexually transmitted  disease 05/11/2018  . Need for prophylactic vaccination against Streptococcus pneumoniae (pneumococcus) 05/11/2018  . Medication monitoring encounter 02/12/2017  . Sprain of shoulder, right 10/01/2016  . Shoulder pain, right 09/26/2016  . Acromioclavicular joint arthritis 09/26/2016  . Encounter for long-term (current) use of medications 06/08/2015  . HNP (herniated nucleus pulposus), lumbar 03/15/2015  . Fatigue 01/26/2015  . S/P right knee arthroscopy 12/31/2013  . INGUINAL HERNIORRHAPHIES, BILATERAL, HX OF 01/15/2010  . VARICOCELE 07/05/2009  . HEMATOSPERMIA 06/27/2009  . Essential hypertension 12/08/2008  . Atrial fibrillation ([place]) 12/08/2008  . Cerebral artery occlusion with cerebral infarction ([place]) 12/08/2008  . Human immunodeficiency virus disease ([place]) 11/18/2006    Past Medical History:  Diagnosis Date  . Anemia   . Arthritis    "probably right ring finger joint" (05/28/2018)  . Childhood asthma    as child none now  . CVA (cerebral vascular accident) ([place]) 1999 X 2   LEFT FRONTAL & OCCIPITAL , NON-HEMORRHAGIC; no residual  . Family history of malignant neoplasm of gastrointestinal tract   . Heart murmur   . History of blood transfusion 1999   "related to the stroke"  . History of kidney stones   . HIV disease ([place]) dx 1999   MONITORED BY INFECTIOUS DISEASE -- DR [place]  . Hypertension   . Paroxysmal atrial fibrillation ([facility])    CARDIOLOGIST--  DR Rayann Heman  . Right knee meniscal tear   . Skin cancer    skin  . Sleep apnea    "didn't follow thru w/mask" (05/28/2018)    Current Outpatient Medications  Medication Sig Dispense  Refill  . diltiazem (CARDIZEM CD) 240 MG 24 hr capsule TAKE 1 CAPSULE BY MOUTH  DAILY 90 capsule 1  . ELIQUIS 5 MG TABS tablet TAKE 1 TABLET BY MOUTH  TWICE DAILY 180 tablet 1  . TRIUMEQ 600-50-300 MG tablet TAKE 1 TABLET BY MOUTH  EVERY DAY 30 tablet 5  . albuterol (VENTOLIN HFA) 108 (90 Base) MCG/ACT inhaler Inhale 2 puffs into the  lungs every 6 (six) hours as needed for wheezing or shortness of breath. 18 g 2   No current facility-administered medications for this visit.    Allergies  Allergen Reactions  . Morphine Other (See Comments)    hallucinations  . Watermelon [Citrullus Vulgaris] Rash    Social History   Socioeconomic History  . Marital status: Married    Spouse name: Not on file  . Number of children: 1  . Years of education: Not on file  . Highest education level: Not on file  Occupational History  . 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 Smoking status: Never Smoker  . Smokeless tobacco: Never Used  Substance and Sexual Activity  . Alcohol use: Yes    Alcohol/week: 0.0 standard drinks    Comment: 05/28/2018 "rarely; 1-2 per month"  . Drug use: Never  . Sexual activity: Yes    Partners: Male  Other Topics Concern  . Not on file  Social History Narrative   Pt lives in [place]. 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 Difficulty of Paying Living Expenses: Not on file  Food Insecurity:   . Worried About Charity fundraiser in the Last Year: Not on file  . Ran Out of Food in the Last Year: Not on file  Transportation Needs:   . Lack of Transportation (Medical): Not on file  . Lack of Transportation (Non-Medical): Not on file  Physical Activity:   . Days of Exercise per Week: Not on file  . Minutes of Exercise per Session: Not on file  Stress:   . Feeling of Stress : Not on file  Social Connections:   . Frequency of Communication with Friends and Family: Not on file  . Frequency of Social Gatherings with Friends and Family: Not on file  . Attends Religious Services: Not on file  . Active Member of Clubs or Organizations: Not on file  . Attends Archivist Meetings: Not on file  . Marital Status: Not on file  Intimate Partner Violence:   . Fear of Current or  Ex-Partner: Not on file  . Emotionally Abused: Not on file  . Physically Abused: Not on file  . Sexually Abused: Not on file    Review of Systems  Constitutional: Negative.   HENT: Negative.   Eyes: Negative.   Respiratory: Positive for shortness of breath. Negative for cough, hemoptysis, sputum production and wheezing.   Cardiovascular: Negative.  Negative for chest pain, palpitations, orthopnea, claudication, leg swelling and PND.  Gastrointestinal: Negative.   Genitourinary: Negative.   Musculoskeletal: Negative.   Skin: Negative.   Neurological: Negative.   Endo/Heme/Allergies: Negative.   Psychiatric/Behavioral: Negative.   All other systems reviewed and are negative.   Objective   Vitals as reported by the patient: Today's Vitals   06/11/19 1040  Weight: 266 lb (120.7 kg)  Height: 6\' 1"  (1.854 m)    Ethan Lambert was seen today for follow-up.  Diagnoses and all orders  for this visit:  Shortness of breath -     albuterol (VENTOLIN HFA) 108 (90 Base) MCG/ACT inhaler; Inhale 2 puffs into the lungs every 6 (six) hours as needed for wheezing or shortness of breath.   PLAN  Albuterol inhaler for use for shob  Follow up with cardiology   Plan to present to clinic in 1-3 mos for labs and CPE  Work note provided  Discussed typical case of covid - unsure of long term effects d/t novelty of disease, however, lingering symptoms 81mo after initial symptoms not uncommon  Patient encouraged to call clinic with any questions, comments, or concerns.   I discussed the assessment and treatment plan with the patient. The patient was provided an opportunity to ask questions and all were answered. The patient agreed with the plan and demonstrated an understanding of the instructions.   The patient was advised to call back or seek an in-person evaluation if the symptoms worsen or if the condition fails to improve as anticipated.  I provided 16 minutes of non-face-to-face time during  this encounter.  Maximiano Coss, NP  Primary Care at [facility]

## 2019-06-20 ENCOUNTER — Other Ambulatory Visit: Payer: Self-pay | Admitting: Internal Medicine

## 2019-06-28 ENCOUNTER — Other Ambulatory Visit: Payer: Self-pay | Admitting: Internal Medicine

## 2019-07-01 DIAGNOSIS — B20 Human immunodeficiency virus [HIV] disease: Secondary | ICD-10-CM

## 2019-07-09 MED ORDER — TRIUMEQ 600-50-300 MG PO TABS: 1.0000 | ORAL_TABLET | Freq: Every day | ORAL | 0 refills | Status: DC

## 2019-07-09 NOTE — Telephone Encounter (Signed)
Reached out to patient. He left an appointment in triage asking for a refill. Patient last office visit 04/2018; annual appointment cancelled due to his positive Covid test.  RN scheduled him for 2/4, will need labs same day.  Sent 30 day supply, and he has medications through the end of the month.  He is also reaching out to cardiology to reschedule that follow up. Patient may want to discuss covid vaccine at upcoming appointment, as he declined when offered it through work this week. Landis Gandy, RN

## 2019-07-15 ENCOUNTER — Other Ambulatory Visit: Payer: Self-pay

## 2019-07-15 ENCOUNTER — Encounter (HOSPITAL_COMMUNITY): Payer: Self-pay | Admitting: Nurse Practitioner

## 2019-07-15 ENCOUNTER — Ambulatory Visit (HOSPITAL_COMMUNITY)
Admission: RE | Admit: 2019-07-15 | Discharge: 2019-07-15 | Disposition: A | Discharge status: Home / Self Care | Payer: 59 | Source: Ambulatory Visit | Attending: Nurse Practitioner | Admitting: Nurse Practitioner

## 2019-07-15 VITALS — BP 138/76 | HR 67 | Ht 73.0 in | Wt 260.8 lb

## 2019-07-15 DIAGNOSIS — I480 Paroxysmal atrial fibrillation: Secondary | ICD-10-CM | POA: Diagnosis not present

## 2019-07-15 DIAGNOSIS — G4730 Sleep apnea, unspecified: Secondary | ICD-10-CM | POA: Diagnosis not present

## 2019-07-15 DIAGNOSIS — Z8249 Family history of ischemic heart disease and other diseases of the circulatory system: Secondary | ICD-10-CM | POA: Diagnosis not present

## 2019-07-15 DIAGNOSIS — R9431 Abnormal electrocardiogram [ECG] [EKG]: Secondary | ICD-10-CM | POA: Insufficient documentation

## 2019-07-15 DIAGNOSIS — Z9049 Acquired absence of other specified parts of digestive tract: Secondary | ICD-10-CM | POA: Diagnosis not present

## 2019-07-15 DIAGNOSIS — Z808 Family history of malignant neoplasm of other organs or systems: Secondary | ICD-10-CM | POA: Insufficient documentation

## 2019-07-15 DIAGNOSIS — Z8042 Family history of malignant neoplasm of prostate: Secondary | ICD-10-CM | POA: Diagnosis not present

## 2019-07-15 DIAGNOSIS — D6869 Other thrombophilia: Secondary | ICD-10-CM | POA: Diagnosis not present

## 2019-07-15 DIAGNOSIS — Z800 Family history of malignant neoplasm of digestive organs: Secondary | ICD-10-CM | POA: Insufficient documentation

## 2019-07-15 DIAGNOSIS — Z85828 Personal history of other malignant neoplasm of skin: Secondary | ICD-10-CM | POA: Insufficient documentation

## 2019-07-15 DIAGNOSIS — Z8673 Personal history of transient ischemic attack (TIA), and cerebral infarction without residual deficits: Secondary | ICD-10-CM | POA: Diagnosis not present

## 2019-07-15 DIAGNOSIS — I10 Essential (primary) hypertension: Secondary | ICD-10-CM | POA: Diagnosis not present

## 2019-07-15 DIAGNOSIS — Z21 Asymptomatic human immunodeficiency virus [HIV] infection status: Secondary | ICD-10-CM | POA: Insufficient documentation

## 2019-07-15 DIAGNOSIS — J984 Other disorders of lung: Secondary | ICD-10-CM | POA: Insufficient documentation

## 2019-07-15 DIAGNOSIS — Z79899 Other long term (current) drug therapy: Secondary | ICD-10-CM | POA: Insufficient documentation

## 2019-07-15 DIAGNOSIS — Z801 Family history of malignant neoplasm of trachea, bronchus and lung: Secondary | ICD-10-CM | POA: Diagnosis not present

## 2019-07-15 DIAGNOSIS — Z7901 Long term (current) use of anticoagulants: Secondary | ICD-10-CM | POA: Diagnosis not present

## 2019-07-15 DIAGNOSIS — Z886 Allergy status to analgesic agent status: Secondary | ICD-10-CM | POA: Insufficient documentation

## 2019-07-15 NOTE — Progress Notes (Addendum)
Patient ID: Ethan Lambert, male   DOB: May 01, 1961, 59 y.o.   MRN: [id number]     Primary Care Physician: Maximiano Coss, NP Referring Physician: Dr. Becky Sax CYLUS Lambert is a 59 y.o. male with a h/o PAF for evaluation s/p ablation 05/28/18. He has done well since then. Has had to take flecainide PIP x one time.  Marland KitchenHe is in SR this am.  No alcohol use, mod caffeine, usually not a trigger, negative sleep study in past.  He did have covid in December and had acute symptoms x one week but  felt fatigued  a little longer than that. He was able to recover at home. He had 11 family members with covid around the same time after thanksgiving and unfortunately his 47 yo father died form covid.CHA2DS2VASc score of 3, continues on eliquis.   Today, he denies symptoms of palpitations, chest pain, shortness of breath, orthopnea, PND, lower extremity edema, dizziness, presyncope, syncope, or neurologic sequela. The patient is tolerating medications without difficulties and is otherwise without complaint today.   Past Medical History:  Diagnosis Date  . Anemia   . Arthritis    "probably right ring finger joint" (05/28/2018)  . Childhood asthma    as child none now  . CVA (cerebral vascular accident) ([place]) 1999 X 2   LEFT FRONTAL & OCCIPITAL , NON-HEMORRHAGIC; no residual  . Family history of malignant neoplasm of gastrointestinal tract   . Heart murmur   . History of blood transfusion 1999   "related to the stroke"  . History of kidney stones   . HIV disease ([place]) dx 1999   MONITORED BY INFECTIOUS DISEASE -- DR [place]  . Hypertension   . Paroxysmal atrial fibrillation ([facility])    CARDIOLOGIST--  DR Rayann Heman  . Right knee meniscal tear   . Skin cancer    skin  . Sleep apnea    "didn't follow thru w/mask" (05/28/2018)   Past Surgical History:  Procedure Laterality Date  . APPENDECTOMY  06-09-2003  . ATRIAL FIBRILLATION ABLATION  05/28/2018  . ATRIAL FIBRILLATION ABLATION N/A 05/28/2018    Procedure: ATRIAL FIBRILLATION ABLATION;  Surgeon: Thompson Grayer, MD;  Location: [facility];  Service: Cardiovascular;  Laterality: N/A;  . BACK SURGERY    . INGUINAL HERNIA REPAIR Bilateral 1990s  . KNEE ARTHROSCOPY WITH MEDIAL MENISECTOMY Right 12/31/2013   Procedure: RIGHT KNEE ARTHROSCOPY PARTIAL MEDIAL MENISCECTOMY DEBRIDEMENT AND CHONDROPLASTY ;  Surgeon: Sydnee Cabal, MD;  Location: [facility];  Service: Orthopedics;  Laterality: Right;  . KNEE ARTHROSCOPY WITH MEDIAL MENISECTOMY Left 01/27/2019   Procedure: LEFT KNEE ARTHROSCOPY WITH PARTIAL MEDIAL MENISCECTOMY;  Surgeon: Leandrew Koyanagi, MD;  Location: [facility];  Service: Orthopedics;  Laterality: Left;  . LAPAROSCOPIC CHOLECYSTECTOMY  07-08-2003  . LASER ABLATION ANAL CONDYLOMA  05-18-2001  . LUMBAR LAMINECTOMY/DECOMPRESSION MICRODISCECTOMY Left 03/15/2015   Procedure: Left Lumbar five- sacral one microdiskectomy;  Surgeon: Consuella Lose, MD;  Location: [facility];  Service: Neurosurgery;  Laterality: Left;  Left L5S1 microdiskectomy  . NEGATIVE SLEEP STUDY  01-03-2012  in epic  . SKIN CANCER EXCISION Right    "cut it off my shoulder"  . TRANSTHORACIC ECHOCARDIOGRAM  12-01-2008   MODERATE LVH/  EF 55-60%/  MILD MR/  NEGATIVE BUBBLE STUDY    Current Outpatient Medications  Medication Sig Dispense Refill  . abacavir-dolutegravir-lamiVUDine (TRIUMEQ) 600-50-300 MG tablet Take 1 tablet by mouth daily. 30 tablet 0  . albuterol (VENTOLIN HFA)  108 (90 Base) MCG/ACT inhaler Inhale 2 puffs into the lungs every 6 (six) hours as needed for wheezing or shortness of breath. 18 g 2  . diltiazem (CARDIZEM CD) 240 MG 24 hr capsule TAKE 1 CAPSULE BY MOUTH  DAILY 90 capsule 1  . ELIQUIS 5 MG TABS tablet TAKE 1 TABLET BY MOUTH  TWICE DAILY 180 tablet 1  . flecainide (TAMBOCOR) 150 MG tablet Take 150 mg by mouth daily. Takes 2 tablets - prn for his A-fib episodes     No current facility-administered  medications for this encounter.    Allergies  Allergen Reactions  . Morphine Other (See Comments)    hallucinations    Social History   Socioeconomic History  . Marital status: Married    Spouse name: Not on file  . Number of children: 1  . Years of education: Not on file  . Highest education level: Not on file  Occupational History  . 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 Smoking status: Never Smoker  . Smokeless tobacco: Never Used  Substance and Sexual Activity  . Alcohol use: Yes    Alcohol/week: 0.0 standard drinks    Comment: 05/28/2018 "rarely; 1-2 per month"  . Drug use: Never  . Sexual activity: Yes    Partners: Male  Other Topics Concern  . Not on file  Social History Narrative   Pt lives in [place]. 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 Difficulty of Paying Living Expenses: Not on file  Food Insecurity:   . Worried About Charity fundraiser in the Last Year: Not on file  . Ran Out of Food in the Last Year: Not on file  Transportation Needs:   . Lack of Transportation (Medical): Not on file  . Lack of Transportation (Non-Medical): Not on file  Physical Activity:   . Days of Exercise per Week: Not on file  . Minutes of Exercise per Session: Not on file  Stress:   . Feeling of Stress : Not on file  Social Connections:   . Frequency of Communication with Friends and Family: Not on file  . Frequency of Social Gatherings with Friends and Family: Not on file  . Attends Religious Services: Not on file  . Active Member of Clubs or Organizations: Not on file  . Attends Archivist Meetings: Not on file  . Marital Status: Not on file  Intimate Partner Violence:   . Fear of Current or Ex-Partner: Not on file  . Emotionally Abused: Not on file  . Physically Abused: Not on file  . Sexually Abused: Not on file    Family History  Problem  Relation Age of Onset  . Arrhythmia Father   . Melanoma Father   . Heart attack Brother   . Prostate cancer Maternal Grandfather   . Colon cancer Maternal Grandfather   . Lung cancer Mother        smoker  . Hypertension Mother   . Hyperlipidemia Mother   . Other Maternal Grandmother        harding of the arteries    ROS- All systems are reviewed and negative except as per the HPI above  Physical Exam: Vitals:   07/15/19 1036  BP: 138/76  Pulse: 67  Weight: 118.3 kg  Height: 6\' 1"  (1.854 m)    GEN- The patient is well appearing, alert and oriented x  3 today.   Head- normocephalic, atraumatic Eyes-  Sclera clear, conjunctiva pink Ears- hearing intact Oropharynx- clear Neck- supple, no JVP Lymph- no cervical lymphadenopathy Lungs- Clear to ausculation bilaterally, normal work of breathing Heart- Regular rate and rhythm, no murmurs, rubs or gallops, PMI not laterally displaced GI- soft, NT, ND, + BS Extremities- no clubbing, cyanosis, or edema MS- no significant deformity or atrophy Skin- no rash or lesion Psych- euthymic mood, full affect Neuro- strength and sensation are intact  EKG- NSR at 67 bpm, pr int 206 ms, qrs int 108 ms, qtc 422 ms  Epic records reviewed  Assessment and Plan: 1. PAF S/p ablation  Maintaining SR Off daily flecainide but has PIP flecainide. occasionally uses it with resolution of aifb  Continue diltiazem  240 mg daily  Continue eliquis with CHA2DS2VASc score of 3   2. HTN Stable   F/u here in 6 months  Butch Penny C. Evalynn Hankins, [facility] [address] [place], [place] [address] [phone]

## 2019-07-28 ENCOUNTER — Telehealth: Payer: Self-pay

## 2019-07-28 NOTE — Telephone Encounter (Signed)
COVID-19 Pre-Screening Questions:07/28/19  Do you currently have a fever (>100 F), chills or unexplained body aches? NO*   Are you currently experiencing new cough, shortness of breath, sore throat, runny nose? NO .  Have you recently travelled outside the state of [place] in the last 14 days? NO*  .  Have you been in contact with someone that is currently pending confirmation of Covid19 testing or has been confirmed to have the [place] virus?  NO  **If the patient answers NO to ALL questions -  advise the patient to please call the clinic before coming to the office should any symptoms develop.

## 2019-07-29 ENCOUNTER — Other Ambulatory Visit: Payer: Self-pay

## 2019-07-29 ENCOUNTER — Ambulatory Visit: Payer: 59 | Admitting: Internal Medicine

## 2019-07-29 ENCOUNTER — Encounter: Payer: Self-pay | Admitting: Internal Medicine

## 2019-07-29 VITALS — BP 130/73 | HR 75 | Temp 98.2°F | Ht 73.0 in | Wt 263.0 lb

## 2019-07-29 DIAGNOSIS — B20 Human immunodeficiency virus [HIV] disease: Secondary | ICD-10-CM | POA: Diagnosis not present

## 2019-07-29 DIAGNOSIS — Z23 Encounter for immunization: Secondary | ICD-10-CM | POA: Diagnosis not present

## 2019-07-29 DIAGNOSIS — Z5181 Encounter for therapeutic drug level monitoring: Secondary | ICD-10-CM

## 2019-07-29 DIAGNOSIS — Z79899 Other long term (current) drug therapy: Secondary | ICD-10-CM | POA: Diagnosis not present

## 2019-07-29 DIAGNOSIS — Z113 Encounter for screening for infections with a predominantly sexual mode of transmission: Secondary | ICD-10-CM

## 2019-07-29 MED ORDER — TRIUMEQ 600-50-300 MG PO TABS: 1.0000 | ORAL_TABLET | Freq: Every day | ORAL | 11 refills | Status: DC

## 2019-07-29 NOTE — Progress Notes (Signed)
   Subjective:    Patient ID: Ethan Lambert, male    DOB: 01-28-1961, 59 y.o.   MRN: [id number]  HPI Here for follow up of HIV Takes Triumeq and no missed doses. No new issues.  CD4 677, viral load < 20.  No new complaints.  He did have COVID in November and recovered fully.  He has beenoffered the vaccine but declines in order to wait for the [employer] vaccine.    Review of Systems  Constitutional: Negative for fatigue and unexpected weight change.  Gastrointestinal: Negative for diarrhea and nausea.  Skin: Negative for rash.       Objective:   Physical Exam Constitutional:      Appearance: Normal appearance.  Eyes:     General: No scleral icterus. Cardiovascular:     Rate and Rhythm: Normal rate and regular rhythm.  Pulmonary:     Effort: Pulmonary effort is normal.  Neurological:     General: No focal deficit present.     Mental Status: He is alert.  Psychiatric:        Mood and Affect: Mood normal.   SH: no tobacco        Assessment & Plan:

## 2019-07-29 NOTE — Assessment & Plan Note (Signed)
He is doing very well and no changes needed.   He can rtc in 1 year

## 2019-07-29 NOTE — Assessment & Plan Note (Signed)
Screened negative 

## 2019-07-29 NOTE — Assessment & Plan Note (Signed)
PCV 7 today.

## 2019-07-29 NOTE — Assessment & Plan Note (Signed)
Creat, LFTs wnl.  

## 2019-08-07 ENCOUNTER — Other Ambulatory Visit: Payer: Self-pay | Admitting: Internal Medicine

## 2019-08-09 NOTE — Telephone Encounter (Signed)
Pt last saw Roderic Palau, NP on 07/14/18, last labs 05/12/19 Creat 0.98, age 59, weight 119.3kg, based on specified criteria pt is on appropriate dosage of Eliquis 5mg  BID.  Will refill rx.

## 2019-10-06 ENCOUNTER — Telehealth: Payer: Self-pay | Admitting: Internal Medicine

## 2019-10-06 MED ORDER — DILTIAZEM HCL ER COATED BEADS 240 MG PO CP24: 240.0000 mg | ORAL_CAPSULE | Freq: Every day | ORAL | 2 refills | Status: DC

## 2019-10-06 NOTE — Telephone Encounter (Signed)
Refill sent to pharmacy.   

## 2019-10-06 NOTE — Telephone Encounter (Signed)
New message    *STAT* If patient is at the pharmacy, call can be transferred to refill team.   1. Which medications need to be refilled? (please list name of each medication and dose if known) diltiazem 240 mg   2. Which pharmacy/location (including street and city if local pharmacy) is medication to be sent to?[facility]  3. Do they need a 30 day or 90 day supply? 90day

## 2019-10-14 NOTE — Progress Notes (Deleted)
Cardiology Office Note Date:  10/14/2019  Patient ID:  Ethan, Lambert [birth year], MRN [id number] PCP:  Maximiano Coss, NP  Cardiologist:  ***  ***refresh   Chief Complaint: *** ??? Annual visit  History of Present Illness: Ethan Lambert is a 59 y.o. male with history of stroke, HIV, HTN, Afib, OSA (unable to afford dental appliance)  He comes in today to be seen for dr. Rayann Heman.  Last seen by him April 2020 via tele health.  At that time, he was struggling with knee pain, minimal palpitations, AFib rate controlled.  His metoprolol was stopped.  She has had subsequent AFib clinic visits, most recently Jan 2021.  pill in pocket flecainide with occ use successful in restroring SR  *** CPAP? *** [place] *** meds *** Bleeding, eliquis, labs    AFib Hx Diagnosed 2010 PVI ablation 05/28/2018  AAD 2010 Flecainide pill in pocket  Past Medical History:  Diagnosis Date  . Anemia   . Arthritis    "probably right ring finger joint" (05/28/2018)  . Childhood asthma    as child none now  . CVA (cerebral vascular accident) ([place]) 1999 X 2   LEFT FRONTAL & OCCIPITAL , NON-HEMORRHAGIC; no residual  . Family history of malignant neoplasm of gastrointestinal tract   . Heart murmur   . History of blood transfusion 1999   "related to the stroke"  . History of kidney stones   . HIV disease ([place]) dx 1999   MONITORED BY INFECTIOUS DISEASE -- DR [place]  . Hypertension   . Paroxysmal atrial fibrillation (HCC)    CARDIOLOGIST--  DR Rayann Heman  . Right knee meniscal tear   . Skin cancer    skin  . Sleep apnea    "didn't follow thru w/mask" (05/28/2018)    Past Surgical History:  Procedure Laterality Date  . APPENDECTOMY  06-09-2003  . ATRIAL FIBRILLATION ABLATION  05/28/2018  . ATRIAL FIBRILLATION ABLATION N/A 05/28/2018   Procedure: ATRIAL FIBRILLATION ABLATION;  Surgeon: Thompson Grayer, MD;  Location: [place] [facility];  Service: Cardiovascular;  Laterality: N/A;  . BACK  SURGERY    . INGUINAL HERNIA REPAIR Bilateral 1990s  . KNEE ARTHROSCOPY WITH MEDIAL MENISECTOMY Right 12/31/2013   Procedure: RIGHT KNEE ARTHROSCOPY PARTIAL MEDIAL MENISCECTOMY DEBRIDEMENT AND CHONDROPLASTY ;  Surgeon: Sydnee Cabal, MD;  Location: [place];  Service: Orthopedics;  Laterality: Right;  . KNEE ARTHROSCOPY WITH MEDIAL MENISECTOMY Left 01/27/2019   Procedure: LEFT KNEE ARTHROSCOPY WITH PARTIAL MEDIAL MENISCECTOMY;  Surgeon: Leandrew Koyanagi, MD;  Location: [place];  Service: Orthopedics;  Laterality: Left;  . LAPAROSCOPIC CHOLECYSTECTOMY  07-08-2003  . LASER ABLATION ANAL CONDYLOMA  05-18-2001  . LUMBAR LAMINECTOMY/DECOMPRESSION MICRODISCECTOMY Left 03/15/2015   Procedure: Left Lumbar five- sacral one microdiskectomy;  Surgeon: Consuella Lose, MD;  Location: [place] [facility];  Service: Neurosurgery;  Laterality: Left;  Left L5S1 microdiskectomy  . NEGATIVE SLEEP STUDY  01-03-2012  in epic  . SKIN CANCER EXCISION Right    "cut it off my shoulder"  . TRANSTHORACIC ECHOCARDIOGRAM  12-01-2008   MODERATE LVH/  EF 55-60%/  MILD MR/  NEGATIVE BUBBLE STUDY    Current Outpatient Medications  Medication Sig Dispense Refill  . abacavir-dolutegravir-lamiVUDine (TRIUMEQ) 600-50-300 MG tablet Take 1 tablet by mouth daily. 30 tablet 11  . albuterol (VENTOLIN HFA) 108 (90 Base) MCG/ACT inhaler Inhale 2 puffs into the lungs every 6 (six) hours as needed for wheezing or shortness of breath. 18 g  2  . diltiazem (CARDIZEM CD) 240 MG 24 hr capsule Take 1 capsule (240 mg total) by mouth daily. 90 capsule 2  . ELIQUIS 5 MG TABS tablet TAKE 1 TABLET BY MOUTH  TWICE DAILY 180 tablet 1  . flecainide (TAMBOCOR) 150 MG tablet Take 150 mg by mouth daily. Takes 2 tablets - prn for his A-fib episodes     No current facility-administered medications for this visit.    Allergies:   Morphine   Social History:  The patient  reports that he has never smoked. He has never used  smokeless tobacco. He reports current alcohol use. He reports that he does not use drugs.   Family History:  The patient's family history includes Arrhythmia in his father; Colon cancer in his maternal grandfather; Heart attack in his brother; Hyperlipidemia in his mother; Hypertension in his mother; Lung cancer in his mother; Melanoma in his father; Other in his maternal grandmother; Prostate cancer in his maternal grandfather.  ROS:  Please see the history of present illness.    All other systems are reviewed and otherwise negative.   PHYSICAL EXAM: *** VS:  There were no vitals taken for this visit. BMI: There is no height or weight on file to calculate BMI. Well nourished, well developed, in no acute distress  HEENT: normocephalic, atraumatic  Neck: no JVD, carotid bruits or masses Cardiac:  *** RRR; no significant murmurs, no rubs, or gallops Lungs:  *** CTA b/l, no wheezing, rhonchi or rales  Abd: soft, nontender MS: no deformity or *** atrophy Ext: *** no edema  Skin: warm and dry, no rash Neuro:  No gross deficits appreciated Psych: euthymic mood, full affect   EKG:  Not done today   05/28/2018: EPS/Ablation CONCLUSIONS: 1. Sinus rhythm upon presentation.   2. Intracardiac echo reveals a moderate sized left atrium with four separate pulmonary veins without evidence of pulmonary vein stenosis. 3. Successful electrical isolation and anatomical encircling of all four pulmonary veins with radiofrequency current. 4. No inducible arrhythmias following ablation both on and off of Isuprel 5. No early apparent complications.   03/13/2016: TTE Study Conclusions  - Left ventricle: The cavity size was normal. Wall thickness was  increased in a pattern of mild LVH. Systolic function was normal.  The estimated ejection fraction was in the range of 55% to 60%.  Wall motion was normal; there were no regional wall motion  abnormalities. Left ventricular diastolic function  parameters  were normal.  - Aortic valve: There was no stenosis. There was trivial  regurgitation.  - Mitral valve: There was no significant regurgitation.  - Right ventricle: The cavity size was normal. Systolic function  was normal.  - Right atrium: The atrium was mildly dilated.  - Pulmonary arteries: PA peak pressure: 28 mm Hg (S).  - Systemic veins: IVC measured 2.5 cm with > 50% respirophasic  variation, suggesting RA pressure 8 mmHg.     Recent Labs: 05/12/2019: ALT 37; BUN 15; Creat 0.98; Hemoglobin 15.2; Platelets 268; Potassium 4.4; Sodium 138  05/12/2019: Cholesterol 182; HDL 28; LDL Cholesterol (Calc) 116; Total CHOL/HDL Ratio 6.5; Triglycerides 267   CrCl cannot be calculated (Patient's most recent lab result is older than the maximum 21 days allowed.).   Wt Readings from Last 3 Encounters:  07/29/19 263 lb (119.3 kg)  07/15/19 260 lb 12.8 oz (118.3 kg)  06/11/19 266 lb (120.7 kg)     Other studies reviewed: Additional studies/records reviewed today include: summarized above  ASSESSMENT  AND PLAN:  1. Paroxysmal Afib     CHA2DS2Vasc is 3, on Eliquis, *** appropriately dosed     *** flecainide pill in the pocket, daily dilt     ***  2. HTN    ***  Disposition: F/u with ***  Current medicines are reviewed at length with the patient today.  The patient did not have any concerns regarding medicines.***  Signed, Tommye Standard, PA-C 10/14/2019 5:33 AM     [facility] [address] [place] [address] [phone] (office)  [phone] (fax)

## 2019-10-15 ENCOUNTER — Ambulatory Visit: Payer: 59 | Admitting: Physician Assistant

## 2019-10-19 NOTE — Progress Notes (Deleted)
PCP:  Maximiano Coss, NP Primary Cardiologist: No primary care provider on file. Electrophysiologist: Thompson Grayer, MD   Ethan Lambert is a 59 y.o. male seen today for Thompson Grayer, MD for routine electrophysiology followup.  Since last being seen in our clinic the patient reports doing ***.  he denies chest pain, palpitations, dyspnea, PND, orthopnea, nausea, vomiting, dizziness, syncope, edema, weight gain, or early satiety.  Past Medical History:  Diagnosis Date  . Anemia   . Arthritis    "probably right ring finger joint" (05/28/2018)  . Childhood asthma    as child none now  . CVA (cerebral vascular accident) ([place]) 1999 X 2   LEFT FRONTAL & OCCIPITAL , NON-HEMORRHAGIC; no residual  . Family history of malignant neoplasm of gastrointestinal tract   . Heart murmur   . History of blood transfusion 1999   "related to the stroke"  . History of kidney stones   . HIV disease ([place]) dx 1999   MONITORED BY INFECTIOUS DISEASE -- DR [place]  . Hypertension   . Paroxysmal atrial fibrillation (HCC)    CARDIOLOGIST--  DR Rayann Heman  . Right knee meniscal tear   . Skin cancer    skin  . Sleep apnea    "didn't follow thru w/mask" (05/28/2018)   Past Surgical History:  Procedure Laterality Date  . APPENDECTOMY  06-09-2003  . ATRIAL FIBRILLATION ABLATION  05/28/2018  . ATRIAL FIBRILLATION ABLATION N/A 05/28/2018   Procedure: ATRIAL FIBRILLATION ABLATION;  Surgeon: Thompson Grayer, MD;  Location: [place] [facility];  Service: Cardiovascular;  Laterality: N/A;  . BACK SURGERY    . INGUINAL HERNIA REPAIR Bilateral 1990s  . KNEE ARTHROSCOPY WITH MEDIAL MENISECTOMY Right 12/31/2013   Procedure: RIGHT KNEE ARTHROSCOPY PARTIAL MEDIAL MENISCECTOMY DEBRIDEMENT AND CHONDROPLASTY ;  Surgeon: Sydnee Cabal, MD;  Location: [place];  Service: Orthopedics;  Laterality: Right;  . KNEE ARTHROSCOPY WITH MEDIAL MENISECTOMY Left 01/27/2019   Procedure: LEFT KNEE ARTHROSCOPY WITH PARTIAL  MEDIAL MENISCECTOMY;  Surgeon: Leandrew Koyanagi, MD;  Location: [place];  Service: Orthopedics;  Laterality: Left;  . LAPAROSCOPIC CHOLECYSTECTOMY  07-08-2003  . LASER ABLATION ANAL CONDYLOMA  05-18-2001  . LUMBAR LAMINECTOMY/DECOMPRESSION MICRODISCECTOMY Left 03/15/2015   Procedure: Left Lumbar five- sacral one microdiskectomy;  Surgeon: Consuella Lose, MD;  Location: [place] [facility];  Service: Neurosurgery;  Laterality: Left;  Left L5S1 microdiskectomy  . NEGATIVE SLEEP STUDY  01-03-2012  in epic  . SKIN CANCER EXCISION Right    "cut it off my shoulder"  . TRANSTHORACIC ECHOCARDIOGRAM  12-01-2008   MODERATE LVH/  EF 55-60%/  MILD MR/  NEGATIVE BUBBLE STUDY    Current Outpatient Medications  Medication Sig Dispense Refill  . abacavir-dolutegravir-lamiVUDine (TRIUMEQ) 600-50-300 MG tablet Take 1 tablet by mouth daily. 30 tablet 11  . albuterol (VENTOLIN HFA) 108 (90 Base) MCG/ACT inhaler Inhale 2 puffs into the lungs every 6 (six) hours as needed for wheezing or shortness of breath. 18 g 2  . diltiazem (CARDIZEM CD) 240 MG 24 hr capsule Take 1 capsule (240 mg total) by mouth daily. 90 capsule 2  . ELIQUIS 5 MG TABS tablet TAKE 1 TABLET BY MOUTH  TWICE DAILY 180 tablet 1  . flecainide (TAMBOCOR) 150 MG tablet Take 150 mg by mouth daily. Takes 2 tablets - prn for his A-fib episodes     No current facility-administered medications for this visit.    Allergies  Allergen Reactions  . Morphine Other (See Comments)  hallucinations    Social History   Socioeconomic History  . Marital status: Married    Spouse name: Not on file  . Number of children: 1  . Years of education: Not on file  . Highest education level: Not on file  Occupational History  . 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 Smoking status: Never Smoker  . Smokeless tobacco: Never Used  Substance and Sexual Activity  . Alcohol use: Yes    Alcohol/week: 0.0  standard drinks    Comment: 05/28/2018 "rarely; 1-2 per month"  . Drug use: Never  . Sexual activity: Yes    Partners: Male  Other Topics Concern  . Not on file  Social History Narrative   Pt lives in [place]. 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 Difficulty of Paying Living Expenses:   Food Insecurity:   . Worried About Charity fundraiser in the Last Year:   . Arboriculturist in the Last Year:   Transportation Needs:   . Film/video editor (Medical):   [name] Lack of Transportation (Non-Medical):   Physical Activity:   . Days of Exercise per Week:   . Minutes of Exercise per Session:   Stress:   . Feeling of Stress :   Social Connections:   . Frequency of Communication with Friends and Family:   . Frequency of Social Gatherings with Friends and Family:   . Attends Religious Services:   . Active Member of Clubs or Organizations:   . Attends Archivist Meetings:   [name] Marital Status:   Intimate Partner Violence:   . Fear of Current or Ex-Partner:   . Emotionally Abused:   [name] Physically Abused:   . Sexually Abused:      Review of Systems: General: No chills, fever, night sweats or weight changes  Cardiovascular:  No chest pain, dyspnea on exertion, edema, orthopnea, palpitations, paroxysmal nocturnal dyspnea Dermatological: No rash, lesions or masses Respiratory: No cough, dyspnea Urologic: No hematuria, dysuria Abdominal: No nausea, vomiting, diarrhea, bright red blood per rectum, melena, or hematemesis Neurologic: No visual changes, weakness, changes in mental status All other systems reviewed and are otherwise negative except as noted above.  Physical Exam: There were no vitals filed for this visit.  GEN- The patient is well appearing, alert and oriented x 3 today.   HEENT: normocephalic, atraumatic; sclera clear, conjunctiva pink; hearing intact; oropharynx clear; neck supple, no  JVP Lymph- no cervical lymphadenopathy Lungs- Clear to ausculation bilaterally, normal work of breathing.  No wheezes, rales, rhonchi Heart- Regular rate and rhythm, no murmurs, rubs or gallops, PMI not laterally displaced GI- soft, non-tender, non-distended, bowel sounds present, no hepatosplenomegaly Extremities- no clubbing, cyanosis, or edema; DP/PT/radial pulses 2+ bilaterally MS- no significant deformity or atrophy Skin- warm and dry, no rash or lesion Psych- euthymic mood, full affect Neuro- strength and sensation are intact  EKG is not ordered. Personal review of EKG from 07/15/19 shows NSR 67 bpm, PR interval 2016 ms, IRBBB at 108 ms  Additional studies reviewed include: EPS from 05/2018, Echo 02/2016 LVEF 55-60%, AF clinic notes, EP office notes  Assessment and Plan:  1. Paroxysmal atrial fibrillation s/p AF ablation 05/2018 Continue eliquis for CHA2DS2VASC of at least 3   Doing very well off toprol and flecainide Continue diltiazem at 240 mg daily  2. Overweight Encouraged weight loss  3. HTN  Continue current medication  Annamaria Helling  10/19/19 2:44 PM

## 2019-10-20 ENCOUNTER — Ambulatory Visit: Payer: 59 | Admitting: Student

## 2019-10-25 ENCOUNTER — Other Ambulatory Visit: Payer: Self-pay

## 2019-10-25 ENCOUNTER — Encounter: Payer: Self-pay | Admitting: Student

## 2019-10-25 ENCOUNTER — Ambulatory Visit: Payer: 59 | Admitting: Student

## 2019-10-25 VITALS — BP 126/76 | HR 62 | Ht 73.0 in | Wt 262.0 lb

## 2019-10-25 DIAGNOSIS — G4733 Obstructive sleep apnea (adult) (pediatric): Secondary | ICD-10-CM | POA: Diagnosis not present

## 2019-10-25 DIAGNOSIS — D6869 Other thrombophilia: Secondary | ICD-10-CM | POA: Diagnosis not present

## 2019-10-25 DIAGNOSIS — I480 Paroxysmal atrial fibrillation: Secondary | ICD-10-CM | POA: Diagnosis not present

## 2019-10-25 DIAGNOSIS — I10 Essential (primary) hypertension: Secondary | ICD-10-CM | POA: Diagnosis not present

## 2019-10-25 LAB — BASIC METABOLIC PANEL
BUN/Creatinine Ratio: 16 (ref 9–20)
BUN: 17 mg/dL (ref 6–24)
CO2: 21 mmol/L (ref 20–29)
Calcium: 9.8 mg/dL (ref 8.7–10.2)
Chloride: 104 mmol/L (ref 96–106)
Creatinine, Ser: 1.08 mg/dL (ref 0.76–1.27)
GFR calc Af Amer: 87 mL/min/{1.73_m2} (ref >59)
GFR calc non Af Amer: 75 mL/min/{1.73_m2} (ref >59)
Glucose: 102 mg/dL — ABNORMAL HIGH (ref 65–99)
Potassium: 4.4 mmol/L (ref 3.5–5.2)
Sodium: 138 mmol/L (ref 134–144)

## 2019-10-25 LAB — CBC
Hematocrit: 44.5 % (ref 37.5–51.0)
Hemoglobin: 15.5 g/dL (ref 13.0–17.7)
MCH: 31.4 pg (ref 26.6–33.0)
MCHC: 34.8 g/dL (ref 31.5–35.7)
MCV: 90 fL (ref 79–97)
Platelets: 261 10*3/uL (ref 150–450)
RBC: 4.94 x10E6/uL (ref 4.14–5.80)
RDW: 13.1 % (ref 11.6–15.4)
WBC: 7.6 10*3/uL (ref 3.4–10.8)

## 2019-10-25 NOTE — Patient Instructions (Addendum)
Medication Instructions:  none *If you need a refill on your cardiac medications before your next appointment, please call your pharmacy*   Lab Work:  TODAY CBC BMET If you have labs (blood work) drawn today and your tests are completely normal, you will receive your results only by: [name] MyChart Message (if you have MyChart) OR . A paper copy in the mail If you have any lab test that is abnormal or we need to change your treatment, we will call you to review the results.   Testing/Procedures: none   Follow-Up:  A Fib Clinic (Schedule in August) At [facility], you and your health needs are our priority.  As part of our continuing mission to provide you with exceptional heart care, we have created designated Provider Care Teams.  These Care Teams include your primary Cardiologist (physician) and Advanced Practice Providers (APPs -  Physician Assistants and Nurse Practitioners) who all work together to provide you with the care you need, when you need it.  We recommend signing up for the patient portal called "MyChart".  Sign up information is provided on this After Visit Summary.  MyChart is used to connect with patients for Virtual Visits (Telemedicine).  Patients are able to view lab/test results, encounter notes, upcoming appointments, etc.  Non-urgent messages can be sent to your provider as well.   To learn more about what you can do with MyChart, go to [email or web address].    Your next appointment:   6 months  The format for your next appointment:   In Person  Provider:   Oda Kilts, PA   Other Instructions

## 2019-10-25 NOTE — Progress Notes (Signed)
PCP:  Maximiano Coss, NP Primary Cardiologist: No primary care provider on file. Electrophysiologist: Thompson Grayer, MD   Ethan Lambert is a 59 y.o. male seen today for Thompson Grayer, MD for routine electrophysiology followup.  Since last being seen in our clinic the patient reports doing well overall. He has COVID in November/December and has short term memory issues on occasion, but otherwise feels like he has healed well.  he denies chest pain, palpitations, dyspnea, PND, orthopnea, nausea, vomiting, dizziness, syncope, edema, weight gain, or early satiety.  Past Medical History:  Diagnosis Date  . Anemia   . Arthritis    "probably right ring finger joint" (05/28/2018)  . Childhood asthma    as child none now  . CVA (cerebral vascular accident) ([place]) 1999 X 2   LEFT FRONTAL & OCCIPITAL , NON-HEMORRHAGIC; no residual  . Family history of malignant neoplasm of gastrointestinal tract   . Heart murmur   . History of blood transfusion 1999   "related to the stroke"  . History of kidney stones   . HIV disease ([place]) dx 1999   MONITORED BY INFECTIOUS DISEASE -- DR [place]  . Hypertension   . Paroxysmal atrial fibrillation (HCC)    CARDIOLOGIST--  DR Rayann Heman  . Right knee meniscal tear   . Skin cancer    skin  . Sleep apnea    "didn't follow thru w/mask" (05/28/2018)   Past Surgical History:  Procedure Laterality Date  . APPENDECTOMY  06-09-2003  . ATRIAL FIBRILLATION ABLATION  05/28/2018  . ATRIAL FIBRILLATION ABLATION N/A 05/28/2018   Procedure: ATRIAL FIBRILLATION ABLATION;  Surgeon: Thompson Grayer, MD;  Location: [place] [facility];  Service: Cardiovascular;  Laterality: N/A;  . BACK SURGERY    . INGUINAL HERNIA REPAIR Bilateral 1990s  . KNEE ARTHROSCOPY WITH MEDIAL MENISECTOMY Right 12/31/2013   Procedure: RIGHT KNEE ARTHROSCOPY PARTIAL MEDIAL MENISCECTOMY DEBRIDEMENT AND CHONDROPLASTY ;  Surgeon: Sydnee Cabal, MD;  Location: [place];  Service:  Orthopedics;  Laterality: Right;  . KNEE ARTHROSCOPY WITH MEDIAL MENISECTOMY Left 01/27/2019   Procedure: LEFT KNEE ARTHROSCOPY WITH PARTIAL MEDIAL MENISCECTOMY;  Surgeon: Leandrew Koyanagi, MD;  Location: [place];  Service: Orthopedics;  Laterality: Left;  . LAPAROSCOPIC CHOLECYSTECTOMY  07-08-2003  . LASER ABLATION ANAL CONDYLOMA  05-18-2001  . LUMBAR LAMINECTOMY/DECOMPRESSION MICRODISCECTOMY Left 03/15/2015   Procedure: Left Lumbar five- sacral one microdiskectomy;  Surgeon: Consuella Lose, MD;  Location: [place] [facility];  Service: Neurosurgery;  Laterality: Left;  Left L5S1 microdiskectomy  . NEGATIVE SLEEP STUDY  01-03-2012  in epic  . SKIN CANCER EXCISION Right    "cut it off my shoulder"  . TRANSTHORACIC ECHOCARDIOGRAM  12-01-2008   MODERATE LVH/  EF 55-60%/  MILD MR/  NEGATIVE BUBBLE STUDY    Current Outpatient Medications  Medication Sig Dispense Refill  . abacavir-dolutegravir-lamiVUDine (TRIUMEQ) 600-50-300 MG tablet Take 1 tablet by mouth daily. 30 tablet 11  . diltiazem (CARDIZEM CD) 240 MG 24 hr capsule Take 1 capsule (240 mg total) by mouth daily. 90 capsule 2  . ELIQUIS 5 MG TABS tablet TAKE 1 TABLET BY MOUTH  TWICE DAILY 180 tablet 1  . flecainide (TAMBOCOR) 150 MG tablet Takes 2 tablets - prn for his A-fib episodes      No current facility-administered medications for this visit.    Allergies  Allergen Reactions  . Morphine Other (See Comments)    hallucinations    Social History   Socioeconomic History  .  Marital status: Married    Spouse name: Not on file  . Number of children: 1  . Years of education: Not on file  . Highest education level: Not on file  Occupational History  . 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 Smoking status: Never Smoker  . Smokeless tobacco: Never Used  Substance and Sexual Activity  . Alcohol use: Yes    Alcohol/week: 0.0 standard drinks    Comment: 05/28/2018 "rarely; 1-2  per month"  . Drug use: Never  . Sexual activity: Yes    Partners: Male  Other Topics Concern  . Not on file  Social History Narrative   Pt lives in [place]. 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 Difficulty of Paying Living Expenses:   Food Insecurity:   . Worried About Charity fundraiser in the Last Year:   . Arboriculturist in the Last Year:   Transportation Needs:   . Film/video editor (Medical):   [name] Lack of Transportation (Non-Medical):   Physical Activity:   . Days of Exercise per Week:   . Minutes of Exercise per Session:   Stress:   . Feeling of Stress :   Social Connections:   . Frequency of Communication with Friends and Family:   . Frequency of Social Gatherings with Friends and Family:   . Attends Religious Services:   . Active Member of Clubs or Organizations:   . Attends Archivist Meetings:   [name] Marital Status:   Intimate Partner Violence:   . Fear of Current or Ex-Partner:   . Emotionally Abused:   [name] Physically Abused:   . Sexually Abused:      Review of Systems: General: No chills, fever, night sweats or weight changes  Cardiovascular:  No chest pain, dyspnea on exertion, edema, orthopnea, palpitations, paroxysmal nocturnal dyspnea Dermatological: No rash, lesions or masses Respiratory: No cough, dyspnea Urologic: No hematuria, dysuria Abdominal: No nausea, vomiting, diarrhea, bright red blood per rectum, melena, or hematemesis Neurologic: No visual changes, weakness, changes in mental status All other systems reviewed and are otherwise negative except as noted above.  Physical Exam: Vitals:   10/25/19 0905  BP: 126/76  Pulse: 62  SpO2: 96%  Weight: 262 lb (118.8 kg)  Height: 6\' 1"  (1.854 m)    GEN- The patient is well appearing, alert and oriented x 3 today.   HEENT: normocephalic, atraumatic; sclera clear, conjunctiva pink; hearing intact; oropharynx clear;  neck supple, no JVP Lymph- no cervical lymphadenopathy Lungs- Clear to ausculation bilaterally, normal work of breathing.  No wheezes, rales, rhonchi Heart- Regular rate and rhythm, no murmurs, rubs or gallops, PMI not laterally displaced GI- soft, non-tender, non-distended, bowel sounds present, no hepatosplenomegaly Extremities- no clubbing, cyanosis, or edema; DP/PT/radial pulses 2+ bilaterally MS- no significant deformity or atrophy Skin- warm and dry, no rash or lesion Psych- euthymic mood, full affect Neuro- strength and sensation are intact  EKG is ordered. Personal review of EKG from today shows sinus brady at 56 bpm, QRS 124 ms, PR interval 212 ms.  Additional studies reviewed include: Previous EP office notes, Previous AF office notes, Echo 02/2016   Assessment and Plan:  1. Paroxysmal AF s/p ablation Continue eliquis for CHA2DS2VASC of at least 3   Continue PiP flecainide, uses a couple of times a week, at most. Continue diltiazem  2. Overweight Body mass  index is 34.57 kg/m.  Encouraged weight loss, especially in setting of OSA  3. HTN Stable No change to current medications  4. OSA He has still been unable to afford/obtain a CPAP peripheral that works for him.  Encouraged weight loss in lieu of CPAP at this time.   Shirley Friar, PA-C  10/25/19 9:28 AM

## 2019-11-01 ENCOUNTER — Telehealth (INDEPENDENT_AMBULATORY_CARE_PROVIDER_SITE_OTHER): Payer: 59 | Admitting: Registered Nurse

## 2019-11-01 ENCOUNTER — Encounter: Payer: Self-pay | Admitting: Registered Nurse

## 2019-11-01 ENCOUNTER — Other Ambulatory Visit: Payer: Self-pay

## 2019-11-01 VITALS — Temp 98.2°F | Ht 73.0 in | Wt 260.0 lb

## 2019-11-01 DIAGNOSIS — J069 Acute upper respiratory infection, unspecified: Secondary | ICD-10-CM | POA: Diagnosis not present

## 2019-11-01 DIAGNOSIS — R05 Cough: Secondary | ICD-10-CM | POA: Diagnosis not present

## 2019-11-01 DIAGNOSIS — R059 Cough, unspecified: Secondary | ICD-10-CM

## 2019-11-01 DIAGNOSIS — Z1211 Encounter for screening for malignant neoplasm of colon: Secondary | ICD-10-CM

## 2019-11-01 MED ORDER — MONTELUKAST SODIUM 10 MG PO TABS: 10.0000 mg | ORAL_TABLET | Freq: Every day | ORAL | 3 refills | Status: DC

## 2019-11-01 MED ORDER — AMOXICILLIN-POT CLAVULANATE 875-125 MG PO TABS: 1.0000 | ORAL_TABLET | Freq: Two times a day (BID) | ORAL | 0 refills | Status: DC

## 2019-11-01 MED ORDER — FLUTICASONE PROPIONATE 50 MCG/ACT NA SUSP: 2.0000 | Freq: Every day | NASAL | 6 refills | Status: DC

## 2019-11-01 NOTE — Progress Notes (Signed)
Telemedicine Encounter- SOAP NOTE Established Patient  This telephone encounter was conducted with the patient's (or proxy's) verbal consent via audio telecommunications: yes  Patient was instructed to have this encounter in a suitably private space; and to only have persons present to whom they give permission to participate. In addition, patient identity was confirmed by use of name plus two identifiers (DOB and address).  I discussed the limitations, risks, security and privacy concerns of performing an evaluation and management service by telephone and the availability of in person appointments. I also discussed with the patient that there may be a patient responsible charge related to this service. The patient expressed understanding and agreed to proceed.  I spent a total of 13 minutes talking with the patient or their proxy.  Chief Complaint  Patient presents with  . chest congestion    buring in the nose, coughing up clear flem, headache on Friday and neck was hurting. Took COVID test was done on Wed and it was Negative.    Subjective   Ethan Lambert is a 59 y.o. established patient. Telephone visit today for upper respiratory infection  HPI One week of cough, sore throat, nasal congestion. Some headache and neck stiffness that has since resolved - no photophobia or true nuchal rigidity, has had hx of bacterial meningitis, does not feel that this is what is happening.   Patient Active Problem List   Diagnosis Date Noted  . Acute medial meniscus tear, left, subsequent encounter 01/22/2019  . Rotator cuff syndrome of left shoulder 07/10/2018  . Paroxysmal atrial fibrillation ([place]) 05/28/2018  . Subacromial bursitis of left shoulder joint 05/19/2018  . Screening examination for sexually transmitted disease 05/11/2018  . Need for prophylactic vaccination against Streptococcus pneumoniae (pneumococcus) 05/11/2018  . Medication monitoring encounter 02/12/2017  . Sprain of  shoulder, right 10/01/2016  . Shoulder pain, right 09/26/2016  . Acromioclavicular joint arthritis 09/26/2016  . Encounter for long-term (current) use of medications 06/08/2015  . HNP (herniated nucleus pulposus), lumbar 03/15/2015  . Fatigue 01/26/2015  . S/P right knee arthroscopy 12/31/2013  . INGUINAL HERNIORRHAPHIES, BILATERAL, HX OF 01/15/2010  . VARICOCELE 07/05/2009  . HEMATOSPERMIA 06/27/2009  . Essential hypertension 12/08/2008  . Atrial fibrillation ([place]) 12/08/2008  . Cerebral artery occlusion with cerebral infarction ([place]) 12/08/2008  . Human immunodeficiency virus disease ([place]) 11/18/2006    Past Medical History:  Diagnosis Date  . Anemia   . Arthritis    "probably right ring finger joint" (05/28/2018)  . Childhood asthma    as child none now  . CVA (cerebral vascular accident) ([place]) 1999 X 2   LEFT FRONTAL & OCCIPITAL , NON-HEMORRHAGIC; no residual  . Family history of malignant neoplasm of gastrointestinal tract   . Heart murmur   . History of blood transfusion 1999   "related to the stroke"  . History of kidney stones   . HIV disease ([place]) dx 1999   MONITORED BY INFECTIOUS DISEASE -- DR [place]  . Hypertension   . Paroxysmal atrial fibrillation ([facility])    CARDIOLOGIST--  DR Rayann Heman  . Right knee meniscal tear   . Skin cancer    skin  . Sleep apnea    "didn't follow thru w/mask" (05/28/2018)    Current Outpatient Medications  Medication Sig Dispense Refill  . abacavir-dolutegravir-lamiVUDine (TRIUMEQ) 600-50-300 MG tablet Take 1 tablet by mouth daily. 30 tablet 11  . diltiazem (CARDIZEM CD) 240 MG 24 hr capsule Take 1 capsule (240 mg total) by  mouth daily. 90 capsule 2  . ELIQUIS 5 MG TABS tablet TAKE 1 TABLET BY MOUTH  TWICE DAILY 180 tablet 1  . flecainide (TAMBOCOR) 150 MG tablet Takes 2 tablets - prn for his A-fib episodes     . amoxicillin-clavulanate (AUGMENTIN) 875-125 MG tablet Take 1 tablet by mouth 2 (two) times daily. 20 tablet 0  .  fluticasone (FLONASE) 50 MCG/ACT nasal spray Place 2 sprays into both nostrils daily. 16 g 6  . montelukast (SINGULAIR) 10 MG tablet Take 1 tablet (10 mg total) by mouth at bedtime. 30 tablet 3   No current facility-administered medications for this visit.    Allergies  Allergen Reactions  . Morphine Other (See Comments)    hallucinations    Social History   Socioeconomic History  . Marital status: Married    Spouse name: Not on file  . Number of children: 1  . Years of education: Not on file  . Highest education level: Not on file  Occupational History  . 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 Smoking status: Never Smoker  . Smokeless tobacco: Never Used  Substance and Sexual Activity  . Alcohol use: Yes    Alcohol/week: 0.0 standard drinks    Comment: 05/28/2018 "rarely; 1-2 per month"  . Drug use: Never  . Sexual activity: Yes    Partners: Male  Other Topics Concern  . Not on file  Social History Narrative   Pt lives in [place]. 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 Difficulty of Paying Living Expenses:   Food Insecurity:   . Worried About Charity fundraiser in the Last Year:   . Arboriculturist in the Last Year:   Transportation Needs:   . Film/video editor (Medical):   [name] Lack of Transportation (Non-Medical):   Physical Activity:   . Days of Exercise per Week:   . Minutes of Exercise per Session:   Stress:   . Feeling of Stress :   Social Connections:   . Frequency of Communication with Friends and Family:   . Frequency of Social Gatherings with Friends and Family:   . Attends Religious Services:   . Active Member of Clubs or Organizations:   . Attends Archivist Meetings:   [name] Marital Status:   Intimate Partner Violence:   . Fear of Current or Ex-Partner:   . Emotionally Abused:   [name] Physically Abused:   . Sexually Abused:      ROS  Objective   Vitals as reported by the patient: Today's Vitals   11/01/19 0848  Temp: 98.2 F (36.8 C)  Weight: 260 lb (117.9 kg)  Height: 6\' 1"  (1.854 m)    Dave was seen today for chest congestion.  Diagnoses and all orders for this visit:  Cough -     montelukast (SINGULAIR) 10 MG tablet; Take 1 tablet (10 mg total) by mouth at bedtime.  Acute upper respiratory infection -     amoxicillin-clavulanate (AUGMENTIN) 875-125 MG tablet; Take 1 tablet by mouth 2 (two) times daily. -     fluticasone (FLONASE) 50 MCG/ACT nasal spray; Place 2 sprays into both nostrils daily. -     montelukast (SINGULAIR) 10 MG tablet; Take 1 tablet (10 mg total) by mouth at bedtime.  Special screening for malignant neoplasms, colon -     Ambulatory referral to Gastroenterology  PLAN  Allergies vs infection. Given hx of covid, chance of current covid infection low.   Augmentin, flonase, singulair  Referral for colonoscopy placed  Return precautions reviewed  Patient encouraged to call clinic with any questions, comments, or concerns.  I discussed the assessment and treatment plan with the patient. The patient was provided an opportunity to ask questions and all were answered. The patient agreed with the plan and demonstrated an understanding of the instructions.   The patient was advised to call back or seek an in-person evaluation if the symptoms worsen or if the condition fails to improve as anticipated.  I provided 13 minutes of non-face-to-face time during this encounter.  Maximiano Coss, NP  [facility] at [facility]

## 2019-11-01 NOTE — Patient Instructions (Signed)
° ° ° °  If you have lab work done today you will be contacted with your lab results within the next 2 weeks.  If you have not heard from us then please contact us. The fastest way to get your results is to register for My Chart. ° ° °IF you received an x-ray today, you will receive an invoice from [facility]. Please contact [facility] at [phone] with questions or concerns regarding your invoice.  ° °IF you received labwork today, you will receive an invoice from [facility]. Please contact [facility] at [phone] with questions or concerns regarding your invoice.  ° °Our billing staff will not be able to assist you with questions regarding bills from these companies. ° °You will be contacted with the lab results as soon as they are available. The fastest way to get your results is to activate your My Chart account. Instructions are located on the last page of this paperwork. If you have not heard from us regarding the results in 2 weeks, please contact this office. °  ° ° ° °

## 2019-11-02 ENCOUNTER — Encounter: Payer: Self-pay | Admitting: Gastroenterology

## 2019-12-15 ENCOUNTER — Encounter: Payer: Self-pay | Admitting: Gastroenterology

## 2019-12-15 ENCOUNTER — Telehealth: Payer: Self-pay

## 2019-12-15 ENCOUNTER — Ambulatory Visit: Payer: 59 | Admitting: Gastroenterology

## 2019-12-15 VITALS — BP 120/70 | HR 64 | Ht 73.0 in | Wt 262.0 lb

## 2019-12-15 DIAGNOSIS — Z86010 Personal history of colonic polyps: Secondary | ICD-10-CM | POA: Diagnosis not present

## 2019-12-15 DIAGNOSIS — Z7901 Long term (current) use of anticoagulants: Secondary | ICD-10-CM

## 2019-12-15 MED ORDER — SUPREP BOWEL PREP KIT 17.5-3.13-1.6 GM/177ML PO SOLN: 1.0000 | ORAL | 0 refills | Status: DC

## 2019-12-15 NOTE — Telephone Encounter (Signed)
OK for 1 day hold however last dose must be longer than 24 hours before procedure and prefer about 30 hours.

## 2019-12-15 NOTE — Patient Instructions (Signed)
If you are age 59 or older, your body mass index should be between 23-30. Your Body mass index is 34.57 kg/m. If this is out of the aforementioned range listed, please consider follow up with your Primary Care Provider.  If you are age 25 or younger, your body mass index should be between 19-25. Your Body mass index is 34.57 kg/m. If this is out of the aformentioned range listed, please consider follow up with your Primary Care Provider.    You have been scheduled for a colonoscopy. Please follow written instructions given to you at your visit today.  Please pick up your prep supplies at the pharmacy within the next 1-3 days. If you use inhalers (even only as needed), please bring them with you on the day of your procedure.  We have sent the following medications to your pharmacy for you to pick up at your convenience: [place] will be contaced by our office prior to your procedure for directions on holding your Eliquis.  If you do not hear from our office 1 week prior to your scheduled procedure, please call [phone] to discuss.  Thank you for choosing me and [facility].  Pricilla Riffle. Dagoberto Ligas., MD., [facility]

## 2019-12-15 NOTE — Telephone Encounter (Signed)
Request for surgical clearance:     Endoscopy Procedure  What type of surgery is being performed?     Colonscopy  When is this surgery scheduled?     01/21/20  What type of clearance is required ?   Pharmacy  Are there any medications that need to be held prior to surgery and how long? Eliquis x2 days  Practice name and name of physician performing surgery?      [facility]  What is your office phone and fax number?      Phone- [phone]  [phone]  Anesthesia type (None, local, MAC, general) ?       MAC

## 2019-12-15 NOTE — Telephone Encounter (Signed)
Patient with diagnosis of afib on Eliquis for anticoagulation.    Procedure: endoscopy Date of procedure: 01/21/20  CHADS2-VASc score of 3 (HTN, stroke)  CrCl 176mL/min using adjusted body weight Platelet count 261K  Recommend only holding Eliquis for 1 day prior to procedure due to elevated cardiac risk. If 2 day hold is necessary, will need MD input.

## 2019-12-15 NOTE — Telephone Encounter (Signed)
   I will route this recommendation to the requesting party via Epic fax function and remove from pre-op pool.  Please call with questions.  [place], [place] 12/15/2019, 1:07 PM

## 2019-12-15 NOTE — Progress Notes (Signed)
History of Present Illness: This is a 59 year old male referred by Maximiano Coss, NP for the evaluation of personal history of adenomatous colon polyps.  He is maintained on long-term anticoagulation with Eliquis.  Colonoscopy performed in September 2012 showed mild left colon diverticulosis and a small transverse colon tubular adenoma. He relates his brother recently had a large colon polyp found requiring a second colonoscopy to attempt removal. Denies weight loss, abdominal pain, constipation, diarrhea, change in stool caliber, melena, hematochezia, nausea, vomiting, dysphagia, reflux symptoms, chest pain.    Allergies  Allergen Reactions  . Morphine Other (See Comments)    hallucinations   Outpatient Medications Prior to Visit  Medication Sig Dispense Refill  . abacavir-dolutegravir-lamiVUDine (TRIUMEQ) 600-50-300 MG tablet Take 1 tablet by mouth daily. 30 tablet 11  . diltiazem (CARDIZEM CD) 240 MG 24 hr capsule Take 1 capsule (240 mg total) by mouth daily. 90 capsule 2  . ELIQUIS 5 MG TABS tablet TAKE 1 TABLET BY MOUTH  TWICE DAILY 180 tablet 1  . flecainide (TAMBOCOR) 150 MG tablet Takes 2 tablets - prn for his A-fib episodes     . amoxicillin-clavulanate (AUGMENTIN) 875-125 MG tablet Take 1 tablet by mouth 2 (two) times daily. 20 tablet 0  . fluticasone (FLONASE) 50 MCG/ACT nasal spray Place 2 sprays into both nostrils daily. 16 g 6  . montelukast (SINGULAIR) 10 MG tablet Take 1 tablet (10 mg total) by mouth at bedtime. 30 tablet 3   No facility-administered medications prior to visit.   Past Medical History:  Diagnosis Date  . Anemia   . Arthritis    "probably right ring finger joint" (05/28/2018)  . Childhood asthma    as child none now  . CVA (cerebral vascular accident) ([place]) 1999 X 2   LEFT FRONTAL & OCCIPITAL , NON-HEMORRHAGIC; no residual  . Family history of malignant neoplasm of gastrointestinal tract   . Heart murmur   . History of blood transfusion 1999    "related to the stroke"  . History of kidney stones   . HIV disease ([place]) dx 1999   MONITORED BY INFECTIOUS DISEASE -- DR [place]  . Hypertension   . Paroxysmal atrial fibrillation ([facility])    CARDIOLOGIST--  DR Rayann Heman  . Right knee meniscal tear   . Skin cancer    skin  . Sleep apnea    "didn't follow thru w/mask" (05/28/2018)   Past Surgical History:  Procedure Laterality Date  . APPENDECTOMY  06-09-2003  . ATRIAL FIBRILLATION ABLATION  05/28/2018  . ATRIAL FIBRILLATION ABLATION N/A 05/28/2018   Procedure: ATRIAL FIBRILLATION ABLATION;  Surgeon: Thompson Grayer, MD;  Location: [facility];  Service: Cardiovascular;  Laterality: N/A;  . BACK SURGERY    . INGUINAL HERNIA REPAIR Bilateral 1990s  . KNEE ARTHROSCOPY WITH MEDIAL MENISECTOMY Right 12/31/2013   Procedure: RIGHT KNEE ARTHROSCOPY PARTIAL MEDIAL MENISCECTOMY DEBRIDEMENT AND CHONDROPLASTY ;  Surgeon: Sydnee Cabal, MD;  Location: [facility];  Service: Orthopedics;  Laterality: Right;  . KNEE ARTHROSCOPY WITH MEDIAL MENISECTOMY Left 01/27/2019   Procedure: LEFT KNEE ARTHROSCOPY WITH PARTIAL MEDIAL MENISCECTOMY;  Surgeon: Leandrew Koyanagi, MD;  Location: [facility];  Service: Orthopedics;  Laterality: Left;  . LAPAROSCOPIC CHOLECYSTECTOMY  07-08-2003  . LASER ABLATION ANAL CONDYLOMA  05-18-2001  . LUMBAR LAMINECTOMY/DECOMPRESSION MICRODISCECTOMY Left 03/15/2015   Procedure: Left Lumbar five- sacral one microdiskectomy;  Surgeon: Consuella Lose, MD;  Location: [facility];  Service: Neurosurgery;  Laterality: Left;  Left L5S1 microdiskectomy  . NEGATIVE SLEEP STUDY  01-03-2012  in epic  . SKIN CANCER EXCISION Right    "cut it off my shoulder"  . TRANSTHORACIC ECHOCARDIOGRAM  12-01-2008   MODERATE LVH/  EF 55-60%/  MILD MR/  NEGATIVE BUBBLE STUDY   Social History   Socioeconomic History  . Marital status: Married    Spouse name: Not on file  . Number of children: 1  . Years of education:  Not on file  . Highest education level: Not on file  Occupational History  . 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 Smoking status: Never Smoker  . Smokeless tobacco: Never Used  Vaping Use  . Vaping Use: Never used  Substance and Sexual Activity  . Alcohol use: Yes    Alcohol/week: 0.0 standard drinks    Comment: 05/28/2018 "rarely; 1-2 per month"  . Drug use: Never  . Sexual activity: Yes    Partners: Male  Other Topics Concern  . Not on file  Social History Narrative   Pt lives in [place]. 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 Difficulty of Paying Living Expenses:   Food Insecurity:   . Worried About Charity fundraiser in the Last Year:   . Arboriculturist in the Last Year:   Transportation Needs:   . Film/video editor (Medical):   [name] Lack of Transportation (Non-Medical):   Physical Activity:   . Days of Exercise per Week:   . Minutes of Exercise per Session:   Stress:   . Feeling of Stress :   Social Connections:   . Frequency of Communication with Friends and Family:   . Frequency of Social Gatherings with Friends and Family:   . Attends Religious Services:   . Active Member of Clubs or Organizations:   . Attends Archivist Meetings:   [name] Marital Status:    Family History  Problem Relation Age of Onset  . Arrhythmia Father   . Melanoma Father   . Heart attack Brother   . Prostate cancer Maternal Grandfather   . Colon cancer Maternal Grandfather   . Lung cancer Mother        smoker  . Hypertension Mother   . Hyperlipidemia Mother   . Other Maternal Grandmother        harding of the arteries     Review of Systems: Pertinent positive and negative review of systems were noted in the above HPI section. All other review of systems were otherwise negative.   Physical Exam: General: Well developed, well nourished, no acute  distress Head: Normocephalic and atraumatic Eyes:  sclerae anicteric, EOMI Ears: Normal auditory acuity Mouth: Not examined, mask on during Covid-19 pandemic Neck: Supple, no masses or thyromegaly Lungs: Clear throughout to auscultation Heart: Regular rate and rhythm; no murmurs, rubs or bruits Abdomen: Soft, non tender and non distended. No masses, hepatosplenomegaly or hernias noted. Normal Bowel sounds Rectal: Deferred to colonoscopy Musculoskeletal: Symmetrical with no gross deformities  Skin: No lesions on visible extremities Pulses:  Normal pulses noted Extremities: No clubbing, cyanosis, edema or deformities noted Neurological: Alert oriented x 4, grossly nonfocal Cervical Nodes:  No significant cervical adenopathy Inguinal Nodes: No significant inguinal adenopathy Psychological:  Alert and cooperative. Normal mood and affect   Assessment and Recommendations:  1.  Personal history of one adenomatous colon polyp.  Family history of  colon polyps, brother. He is overdue for surveillance colonoscopy. The risks (including bleeding, perforation, infection, missed lesions, medication reactions and possible hospitalization or surgery if complications occur), benefits, and alternatives to colonoscopy with possible biopsy and possible polypectomy were discussed with the patient and they consent to proceed.   2. Hold Eliquis 2 days before procedure - will instruct when and how to resume after procedure. Low but real risk of cardiovascular event such as heart attack, stroke, embolism, thrombosis or ischemia/infarct of other organs off Eliquis explained and need to seek urgent help if this occurs. The patient consents to proceed. Will communicate by phone or EMR with patient's prescribing provider to confirm that holding Eliquis is reasonable in this case.     cc: Maximiano Coss, NP [place],  [place] [id number]

## 2019-12-15 NOTE — Telephone Encounter (Signed)
Dr Fuller Plan please see recommendation for holding Eliquis x1 day. Are you ok with holding for 1 day? Please advise.

## 2019-12-17 NOTE — Telephone Encounter (Signed)
Pt informed okay to hold Eliquis for 1 day prior to procedure. Pt informed to hold Eliquis at least 30 hrs before procedure. Pt voiced understanding.

## 2020-01-11 ENCOUNTER — Other Ambulatory Visit: Payer: Self-pay

## 2020-01-11 ENCOUNTER — Telehealth (INDEPENDENT_AMBULATORY_CARE_PROVIDER_SITE_OTHER): Payer: 59 | Admitting: Registered Nurse

## 2020-01-11 ENCOUNTER — Encounter: Payer: Self-pay | Admitting: Registered Nurse

## 2020-01-11 DIAGNOSIS — J069 Acute upper respiratory infection, unspecified: Secondary | ICD-10-CM | POA: Diagnosis not present

## 2020-01-11 MED ORDER — AZELASTINE HCL 0.1 % NA SOLN: 1.0000 | Freq: Two times a day (BID) | NASAL | 12 refills | Status: DC

## 2020-01-11 MED ORDER — GUAIFENESIN-DM 100-10 MG/5ML PO SYRP: 5.0000 mL | ORAL_SOLUTION | ORAL | 0 refills | Status: DC | PRN

## 2020-01-11 NOTE — Progress Notes (Signed)
Telemedicine Encounter- SOAP NOTE Established Patient  This telephone encounter was conducted with the patient's (or proxy's) verbal consent via audio telecommunications: yes  Patient was instructed to have this encounter in a suitably private space; and to only have persons present to whom they give permission to participate. In addition, patient identity was confirmed by use of name plus two identifiers (DOB and address).  I discussed the limitations, risks, security and privacy concerns of performing an evaluation and management service by telephone and the availability of in person appointments. I also discussed with the patient that there may be a patient responsible charge related to this service. The patient expressed understanding and agreed to proceed.  I spent a total of 11 minutes talking with the patient or their proxy.  Chief Complaint  Patient presents with  . Cough    Patient states he has been experiencing some coughing and runny nose since friday. Patient states his daughter had the same symptoms from daycare and is now feeling better. Per patient he had covid in 04/2019 and this isn't the the same.    Subjective   Ethan Lambert is a 59 y.o. established patient. Telephone visit today for upper respiratory symptoms  HPI Pt has had running nose, pnd, cough for about 4 days. Symptoms steady. Reports his daughter had symptoms first - goes to daycare daily. There was a child at daycare sick with strep throat. However, daughter is improving dramatically without symptoms.   No fevers, chills, fatigue, headache, shob, doe, chest pain, sensory changes, nvd.   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   Notably - he is immunocompromised with hx of hiv infx  Patient Active Problem List   Diagnosis Date Noted  . Acute medial meniscus tear, left, subsequent encounter 01/22/2019  .  Rotator cuff syndrome of left shoulder 07/10/2018  . Paroxysmal atrial fibrillation ([place]) 05/28/2018  . Subacromial bursitis of left shoulder joint 05/19/2018  . Screening examination for sexually transmitted disease 05/11/2018  . Need for prophylactic vaccination against Streptococcus pneumoniae (pneumococcus) 05/11/2018  . Medication monitoring encounter 02/12/2017  . Sprain of shoulder, right 10/01/2016  . Shoulder pain, right 09/26/2016  . Acromioclavicular joint arthritis 09/26/2016  . Encounter for long-term (current) use of medications 06/08/2015  . HNP (herniated nucleus pulposus), lumbar 03/15/2015  . Fatigue 01/26/2015  . S/P right knee arthroscopy 12/31/2013  . INGUINAL HERNIORRHAPHIES, BILATERAL, HX OF 01/15/2010  . VARICOCELE 07/05/2009  . HEMATOSPERMIA 06/27/2009  . Essential hypertension 12/08/2008  . Atrial fibrillation ([place]) 12/08/2008  . Cerebral artery occlusion with cerebral infarction ([place]) 12/08/2008  . Human immunodeficiency virus disease ([place]) 11/18/2006    Past Medical History:  Diagnosis Date  . Anemia   . Arthritis    "probably right ring finger joint" (05/28/2018)  . Childhood asthma    as child none now  . CVA (cerebral vascular accident) ([place]) 1999 X 2   LEFT FRONTAL & OCCIPITAL , NON-HEMORRHAGIC; no residual  . Family history of malignant neoplasm of gastrointestinal tract   . Heart murmur   . History of blood transfusion 1999   "related to the stroke"  . History of kidney stones   . HIV disease ([place]) dx 1999   MONITORED BY INFECTIOUS DISEASE -- DR [place]  . Hypertension   . Paroxysmal atrial fibrillation ([facility])    CARDIOLOGIST--  DR Rayann Heman  .  Right knee meniscal tear   . Skin cancer    skin  . Sleep apnea    "didn't follow thru w/mask" (05/28/2018)    Current Outpatient Medications  Medication Sig Dispense Refill  . abacavir-dolutegravir-lamiVUDine (TRIUMEQ) 600-50-300 MG tablet Take 1 tablet by mouth daily. 30 tablet 11  .  diltiazem (CARDIZEM CD) 240 MG 24 hr capsule Take 1 capsule (240 mg total) by mouth daily. 90 capsule 2  . ELIQUIS 5 MG TABS tablet TAKE 1 TABLET BY MOUTH  TWICE DAILY 180 tablet 1  . flecainide (TAMBOCOR) 150 MG tablet Takes 2 tablets - prn for his A-fib episodes     . Na Sulfate-K Sulfate-Mg Sulf (SUPREP BOWEL PREP KIT) 17.5-3.13-1.6 GM/177ML SOLN Take 1 kit by mouth as directed. For colonoscopy prep 354 mL 0  . azelastine (ASTELIN) 0.1 % nasal spray Place 1 spray into both nostrils 2 (two) times daily. Use in each nostril as directed 30 mL 12  . guaiFENesin-dextromethorphan (ROBITUSSIN DM) 100-10 MG/5ML syrup Take 5 mLs by mouth every 4 (four) hours as needed for cough. 118 mL 0   No current facility-administered medications for this visit.    Allergies  Allergen Reactions  . Morphine Other (See Comments)    hallucinations    Social History   Socioeconomic History  . Marital status: Married    Spouse name: Not on file  . Number of children: 1  . Years of education: Not on file  . Highest education level: Not on file  Occupational History  . 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 Smoking status: Never Smoker  . Smokeless tobacco: Never Used  Vaping Use  . Vaping Use: Never used  Substance and Sexual Activity  . Alcohol use: Yes    Alcohol/week: 0.0 standard drinks    Comment: 05/28/2018 "rarely; 1-2 per month"  . Drug use: Never  . Sexual activity: Yes    Partners: Male  Other Topics Concern  . Not on file  Social History Narrative   Pt lives in [place]. 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 Difficulty of Paying Living Expenses:   Food Insecurity:   . Worried About Charity fundraiser in the Last Year:   . Arboriculturist in the Last Year:   Transportation Needs:   . Film/video editor (Medical):   [name] Lack of Transportation (Non-Medical):   Physical  Activity:   . Days of Exercise per Week:   . Minutes of Exercise per Session:   Stress:   . Feeling of Stress :   Social Connections:   . Frequency of Communication with Friends and Family:   . Frequency of Social Gatherings with Friends and Family:   . Attends Religious Services:   . Active Member of Clubs or Organizations:   . Attends Archivist Meetings:   [name] Marital Status:   Intimate Partner Violence:   . Fear of Current or Ex-Partner:   . Emotionally Abused:   [name] Physically Abused:   . Sexually Abused:     Review of Systems  Constitutional: Negative.   HENT: Positive for congestion and sore throat. Negative for ear discharge, ear pain, hearing loss, nosebleeds, sinus pain and tinnitus.   Eyes: Negative.   Respiratory: Positive for cough. Negative for hemoptysis, sputum production, shortness of breath, wheezing and stridor.   Cardiovascular: Negative.   Gastrointestinal: Negative.  Genitourinary: Negative.   Musculoskeletal: Negative.   Skin: Negative.   Neurological: Negative.   Endo/Heme/Allergies: Negative.   Psychiatric/Behavioral: Negative.   All other systems reviewed and are negative.   Objective   Vitals as reported by the patient: There were no vitals filed for this visit.  Burdette was seen today for cough.  Diagnoses and all orders for this visit:  Viral upper respiratory infection -     azelastine (ASTELIN) 0.1 % nasal spray; Place 1 spray into both nostrils 2 (two) times daily. Use in each nostril as directed -     guaiFENesin-dextromethorphan (ROBITUSSIN DM) 100-10 MG/5ML syrup; Take 5 mLs by mouth every 4 (four) hours as needed for cough.   PLAN  Given course and daughter's improvement, this seems to be viral. Suggest regular acetaminophen and will send azelastine and robitussin dm  Work note for yesterday and today  I believe he will be fine to return Friday but we will be cautious for secondary bacterial infection  Patient  encouraged to call clinic with any questions, comments, or concerns.  I discussed the assessment and treatment plan with the patient. The patient was provided an opportunity to ask questions and all were answered. The patient agreed with the plan and demonstrated an understanding of the instructions.   The patient was advised to call back or seek an in-person evaluation if the symptoms worsen or if the condition fails to improve as anticipated.  I provided 11 minutes of non-face-to-face time during this encounter.  Maximiano Coss, NP  Primary Care at [facility]

## 2020-01-11 NOTE — Patient Instructions (Signed)
° ° ° °  If you have lab work done today you will be contacted with your lab results within the next 2 weeks.  If you have not heard from us then please contact us. The fastest way to get your results is to register for My Chart. ° ° °IF you received an x-ray today, you will receive an invoice from [facility]. Please contact [facility] at [phone] with questions or concerns regarding your invoice.  ° °IF you received labwork today, you will receive an invoice from [facility]. Please contact [facility] at [phone] with questions or concerns regarding your invoice.  ° °Our billing staff will not be able to assist you with questions regarding bills from these companies. ° °You will be contacted with the lab results as soon as they are available. The fastest way to get your results is to activate your My Chart account. Instructions are located on the last page of this paperwork. If you have not heard from us regarding the results in 2 weeks, please contact this office. °  ° ° ° °

## 2020-01-13 ENCOUNTER — Other Ambulatory Visit: Payer: Self-pay | Admitting: Registered Nurse

## 2020-01-13 ENCOUNTER — Telehealth: Payer: Self-pay | Admitting: Registered Nurse

## 2020-01-13 DIAGNOSIS — J069 Acute upper respiratory infection, unspecified: Secondary | ICD-10-CM

## 2020-01-13 MED ORDER — AMOXICILLIN-POT CLAVULANATE 875-125 MG PO TABS: 1.0000 | ORAL_TABLET | Freq: Two times a day (BID) | ORAL | 0 refills | Status: DC

## 2020-01-13 MED ORDER — OLOPATADINE HCL 0.6 % NA SOLN: 1.0000 | Freq: Every day | NASAL | 0 refills | Status: DC

## 2020-01-13 NOTE — Telephone Encounter (Signed)
Patients is calling in to let Provider know that he is no better, prescription for nasal spray is not working for him . Patient had to sleep in recliner and is still coughing.

## 2020-01-13 NOTE — Progress Notes (Signed)
Pt not improving afer 7 days with suportive measures - upper respiratory infection may be bacterial Sending augmentin po bid for 10 days  Kathrin Ruddy, NP

## 2020-01-13 NOTE — Telephone Encounter (Signed)
Pt has been informed will let us know if he still does not feel better

## 2020-01-13 NOTE — Telephone Encounter (Signed)
Pt slept in recliner and is reporting Rx did not work for him he is still not improving should he come back? See UC? Or have a different Rx? Please advise.

## 2020-01-13 NOTE — Telephone Encounter (Signed)
I will send augmentin po bid for 10 days.  Will send alternative nasal spray as well  Thank you  Kathrin Ruddy, NP

## 2020-01-14 ENCOUNTER — Telehealth: Payer: Self-pay | Admitting: Registered Nurse

## 2020-01-14 NOTE — Telephone Encounter (Signed)
Pt is calling and would like Ethan Lambert to give him  A work note to return back to work on Monday 01-17-2020. Pt is taking abx and nasal spray. Please put work note in his [employer]

## 2020-01-16 ENCOUNTER — Encounter (HOSPITAL_COMMUNITY): Payer: Self-pay

## 2020-01-16 ENCOUNTER — Other Ambulatory Visit: Payer: Self-pay | Admitting: Internal Medicine

## 2020-01-16 ENCOUNTER — Encounter: Payer: Self-pay | Admitting: Registered Nurse

## 2020-01-16 ENCOUNTER — Ambulatory Visit (HOSPITAL_COMMUNITY)
Admission: EM | Admit: 2020-01-16 | Discharge: 2020-01-16 | Disposition: A | Discharge status: Home / Self Care | Payer: 59 | Attending: Emergency Medicine | Admitting: Emergency Medicine

## 2020-01-16 ENCOUNTER — Other Ambulatory Visit: Payer: Self-pay

## 2020-01-16 DIAGNOSIS — J40 Bronchitis, not specified as acute or chronic: Secondary | ICD-10-CM

## 2020-01-16 MED ORDER — ALBUTEROL SULFATE HFA 108 (90 BASE) MCG/ACT IN AERS: 1.0000 | INHALATION_SPRAY | Freq: Four times a day (QID) | RESPIRATORY_TRACT | 0 refills | Status: DC | PRN

## 2020-01-16 MED ORDER — DM-GUAIFENESIN ER 30-600 MG PO TB12
1.0000 | ORAL_TABLET | Freq: Two times a day (BID) | ORAL | 0 refills | Status: DC
Start: 2020-01-16 — End: 2020-02-21

## 2020-01-16 MED ORDER — BENZONATATE 200 MG PO CAPS
200.0000 mg | ORAL_CAPSULE | Freq: Three times a day (TID) | ORAL | 0 refills | Status: AC | PRN
Start: 2020-01-16 — End: 2020-01-23

## 2020-01-16 MED ORDER — PREDNISONE 50 MG PO TABS: 50.0000 mg | ORAL_TABLET | Freq: Every day | ORAL | 0 refills | Status: AC

## 2020-01-16 NOTE — ED Triage Notes (Addendum)
Patient is here today with complaints of wheezing and intermittent coughing that has been going on for the past several days. Patient states his PCP has prescribed new medications the wheezing has not gotten better. Patient states he was not aware about the OTC Robitussin.

## 2020-01-16 NOTE — Discharge Instructions (Signed)
Continue Augmentin Begin prednisone course for the next 5 days-take with food and in the morning Albuterol inhaler as needed for shortness of breath and wheezing Tessalon/benzonatate every 8 hours for cough Mucinex DM for further relief of congestion cough  Please follow-up if any symptoms not improving or worsening

## 2020-01-17 NOTE — Telephone Encounter (Signed)
Prescription refill request for Eliquis received.  Last office visit: [place], 10/25/2019 Scr: 1.08, 10/25/2019 Age: 59 y.o. Weight: 118.8 kg   Prescription refill sent.

## 2020-01-17 NOTE — ED Provider Notes (Signed)
[facility]    CSN: [id number] Arrival date & time: 01/16/20  1008      History   Chief Complaint Chief Complaint  Patient presents with  . Wheezing    HPI Ethan Lambert is a 59 y.o. male presenting today for evaluation of cough and wheezing.  Patient reports over the past week he has had a lot of cough and congestion.  He has been treating with nasal spray as prescribed by his PCP as well as recently started Augmentin 2 to 3 days ago.  He has felt improvement in his symptoms especially when he has felt in his sinuses, but continues to report a coarse cough and congestion.  Continues to take HIV meds, last levels undetectable.  HPI  Past Medical History:  Diagnosis Date  . Anemia   . Arthritis    "probably right ring finger joint" (05/28/2018)  . Childhood asthma    as child none now  . CVA (cerebral vascular accident) ([place]) 1999 X 2   LEFT FRONTAL & OCCIPITAL , NON-HEMORRHAGIC; no residual  . Family history of malignant neoplasm of gastrointestinal tract   . Heart murmur   . History of blood transfusion 1999   "related to the stroke"  . History of kidney stones   . HIV disease ([place]) dx 1999   MONITORED BY INFECTIOUS DISEASE -- DR [place]  . Hypertension   . Paroxysmal atrial fibrillation ([facility])    CARDIOLOGIST--  DR Rayann Heman  . Right knee meniscal tear   . Skin cancer    skin  . Sleep apnea    "didn't follow thru w/mask" (05/28/2018)    Patient Active Problem List   Diagnosis Date Noted  . Acute medial meniscus tear, left, subsequent encounter 01/22/2019  . Rotator cuff syndrome of left shoulder 07/10/2018  . Paroxysmal atrial fibrillation ([place]) 05/28/2018  . Subacromial bursitis of left shoulder joint 05/19/2018  . Screening examination for sexually transmitted disease 05/11/2018  . Need for prophylactic vaccination against Streptococcus pneumoniae (pneumococcus) 05/11/2018  . Medication monitoring encounter 02/12/2017  . Sprain of shoulder,  right 10/01/2016  . Shoulder pain, right 09/26/2016  . Acromioclavicular joint arthritis 09/26/2016  . Encounter for long-term (current) use of medications 06/08/2015  . HNP (herniated nucleus pulposus), lumbar 03/15/2015  . Fatigue 01/26/2015  . S/P right knee arthroscopy 12/31/2013  . INGUINAL HERNIORRHAPHIES, BILATERAL, HX OF 01/15/2010  . VARICOCELE 07/05/2009  . HEMATOSPERMIA 06/27/2009  . Essential hypertension 12/08/2008  . Atrial fibrillation ([place]) 12/08/2008  . Cerebral artery occlusion with cerebral infarction ([place]) 12/08/2008  . Human immunodeficiency virus disease ([place]) 11/18/2006    Past Surgical History:  Procedure Laterality Date  . APPENDECTOMY  06-09-2003  . ATRIAL FIBRILLATION ABLATION  05/28/2018  . ATRIAL FIBRILLATION ABLATION N/A 05/28/2018   Procedure: ATRIAL FIBRILLATION ABLATION;  Surgeon: Thompson Grayer, MD;  Location: [facility];  Service: Cardiovascular;  Laterality: N/A;  . BACK SURGERY    . INGUINAL HERNIA REPAIR Bilateral 1990s  . KNEE ARTHROSCOPY WITH MEDIAL MENISECTOMY Right 12/31/2013   Procedure: RIGHT KNEE ARTHROSCOPY PARTIAL MEDIAL MENISCECTOMY DEBRIDEMENT AND CHONDROPLASTY ;  Surgeon: Sydnee Cabal, MD;  Location: [facility];  Service: Orthopedics;  Laterality: Right;  . KNEE ARTHROSCOPY WITH MEDIAL MENISECTOMY Left 01/27/2019   Procedure: LEFT KNEE ARTHROSCOPY WITH PARTIAL MEDIAL MENISCECTOMY;  Surgeon: Leandrew Koyanagi, MD;  Location: [facility];  Service: Orthopedics;  Laterality: Left;  . LAPAROSCOPIC CHOLECYSTECTOMY  07-08-2003  . LASER ABLATION ANAL  CONDYLOMA  05-18-2001  . LUMBAR LAMINECTOMY/DECOMPRESSION MICRODISCECTOMY Left 03/15/2015   Procedure: Left Lumbar five- sacral one microdiskectomy;  Surgeon: Consuella Lose, MD;  Location: [facility];  Service: Neurosurgery;  Laterality: Left;  Left L5S1 microdiskectomy  . NEGATIVE SLEEP STUDY  01-03-2012  in epic  . SKIN CANCER EXCISION Right    "cut it off  my shoulder"  . TRANSTHORACIC ECHOCARDIOGRAM  12-01-2008   MODERATE LVH/  EF 55-60%/  MILD MR/  NEGATIVE BUBBLE STUDY       Home Medications    Prior to Admission medications   Medication Sig Start Date End Date Taking? Authorizing Provider  abacavir-dolutegravir-lamiVUDine (TRIUMEQ) 600-50-300 MG tablet Take 1 tablet by mouth daily. 07/29/19   Comer, Okey Regal, MD  albuterol (VENTOLIN HFA) 108 (90 Base) MCG/ACT inhaler Inhale 1-2 puffs into the lungs every 6 (six) hours as needed for wheezing or shortness of breath. 01/16/20   Yesennia Hirota C, PA-C  amoxicillin-clavulanate (AUGMENTIN) 875-125 MG tablet Take 1 tablet by mouth 2 (two) times daily. 01/13/20   Maximiano Coss, NP  benzonatate (TESSALON) 200 MG capsule Take 1 capsule (200 mg total) by mouth 3 (three) times daily as needed for up to 7 days for cough. 01/16/20 01/23/20  Allisyn Kunz C, PA-C  dextromethorphan-guaiFENesin (MUCINEX DM) 30-600 MG 12hr tablet Take 1 tablet by mouth 2 (two) times daily. 01/16/20   Earnest Mcgillis C, PA-C  diltiazem (CARDIZEM CD) 240 MG 24 hr capsule Take 1 capsule (240 mg total) by mouth daily. 10/06/19   Allred, Jeneen Rinks, MD  ELIQUIS 5 MG TABS tablet TAKE 1 TABLET BY MOUTH  TWICE DAILY 01/17/20   Allred, Jeneen Rinks, MD  flecainide (TAMBOCOR) 150 MG tablet Takes 2 tablets - prn for his A-fib episodes     [provider]  Na Sulfate-K Sulfate-Mg Sulf (SUPREP BOWEL PREP KIT) 17.5-3.13-1.6 GM/177ML SOLN Take 1 kit by mouth as directed. For colonoscopy prep 12/15/19   Ladene Artist, MD  Olopatadine HCl 0.6 % SOLN Place 1 spray into the nose daily. 01/13/20   Maximiano Coss, NP  predniSONE (DELTASONE) 50 MG tablet Take 1 tablet (50 mg total) by mouth daily for 5 days. 01/16/20 01/21/20  Yusuf Yu C, PA-C  azelastine (ASTELIN) 0.1 % nasal spray Place 1 spray into both nostrils 2 (two) times daily. Use in each nostril as directed 01/11/20 01/13/20  Maximiano Coss, NP    Family History Family History    Problem Relation Age of Onset  . Arrhythmia Father   . Melanoma Father   . Heart attack Brother   . Prostate cancer Maternal Grandfather   . Colon cancer Maternal Grandfather   . Lung cancer Mother        smoker  . Hypertension Mother   . Hyperlipidemia Mother   . Other Maternal Grandmother        harding of the arteries    Social History Social History   Tobacco Use  . Smoking status: Never Smoker  . Smokeless tobacco: Never Used  Vaping Use  . Vaping Use: Never used  Substance Use Topics  . Alcohol use: Yes    Alcohol/week: 0.0 standard drinks    Comment: 05/28/2018 "rarely; 1-2 per month"  . Drug use: Never     Allergies   Morphine   Review of Systems Review of Systems  Constitutional: Negative for activity change, appetite change, chills, fatigue and fever.  HENT: Positive for congestion, rhinorrhea and sore throat. Negative for ear pain, sinus pressure and  trouble swallowing.   Eyes: Negative for discharge and redness.  Respiratory: Positive for cough, chest tightness, shortness of breath and wheezing.   Cardiovascular: Negative for chest pain.  Gastrointestinal: Negative for abdominal pain, diarrhea, nausea and vomiting.  Musculoskeletal: Negative for myalgias.  Skin: Negative for rash.  Neurological: Negative for dizziness, light-headedness and headaches.     Physical Exam Triage Vital Signs ED Triage Vitals  Enc Vitals Group     BP 01/16/20 1040 (!) 130/81     Pulse Rate 01/16/20 1040 66     Resp 01/16/20 1040 18     Temp 01/16/20 1040 98.5 F (36.9 C)     Temp Source 01/16/20 1040 Oral     SpO2 01/16/20 1040 95 %     Weight --      Height --      Head Circumference --      Peak Flow --      Pain Score 01/16/20 1043 4     Pain Loc --      Pain Edu? --      Excl. in [place]? --    No data found.  Updated Vital Signs BP (!) 130/81 (BP Location: Left Arm)   Pulse 66   Temp 98.5 F (36.9 C) (Oral)   Resp 18   SpO2 95%   Visual  Acuity Right Eye Distance:   Left Eye Distance:   Bilateral Distance:    Right Eye Near:   Left Eye Near:    Bilateral Near:     Physical Exam Vitals and nursing note reviewed.  Constitutional:      Appearance: He is well-developed.     Comments: No acute distress  HENT:     Head: Normocephalic and atraumatic.     Ears:     Comments: Bilateral ears without tenderness to palpation of external auricle, tragus and mastoid, EAC's without erythema or swelling, TM's with good bony landmarks and cone of light. Non erythematous.     Nose: Nose normal.     Mouth/Throat:     Comments: Oral mucosa pink and moist, no tonsillar enlargement or exudate. Posterior pharynx patent and nonerythematous, no uvula deviation or swelling. Normal phonation.  Eyes:     Conjunctiva/sclera: Conjunctivae normal.  Cardiovascular:     Rate and Rhythm: Normal rate.  Pulmonary:     Effort: Pulmonary effort is normal. No respiratory distress.     Comments: Breath sounds diffusely coarse with rhonchi present in bilateral lung fields Abdominal:     General: There is no distension.  Musculoskeletal:        General: Normal range of motion.     Cervical back: Neck supple.  Skin:    General: Skin is warm and dry.  Neurological:     Mental Status: He is alert and oriented to person, place, and time.      UC Treatments / Results  Labs (all labs ordered are listed, but only abnormal results are displayed) Labs Reviewed - No data to display  EKG   Radiology No results found.  Procedures Procedures (including critical care time)  Medications Ordered in UC Medications - No data to display  Initial Impression / Assessment and Plan / UC Course  I have reviewed the triage vital signs and the nursing notes.  Pertinent labs & imaging results that were available during my care of the patient were reviewed by me and considered in my medical decision making (see chart for details).  Exam consistent  with bronchitis, will have patient continue Augmentin, but will initiate on 5-day course of prednisone with albuterol, Tessalon for cough and continue Mucinex DM.  Rest and fluids.  Continue to monitor breathing,Discussed strict return precautions. Patient verbalized understanding and is agreeable with plan.   Final Clinical Impressions(s) / UC Diagnoses   Final diagnoses:  Bronchitis     Discharge Instructions     Continue Augmentin Begin prednisone course for the next 5 days-take with food and in the morning Albuterol inhaler as needed for shortness of breath and wheezing Tessalon/benzonatate every 8 hours for cough Mucinex DM for further relief of congestion cough  Please follow-up if any symptoms not improving or worsening   ED Prescriptions    Medication Sig Dispense Auth. Provider   benzonatate (TESSALON) 200 MG capsule Take 1 capsule (200 mg total) by mouth 3 (three) times daily as needed for up to 7 days for cough. 28 capsule Vayden Weinand C, PA-C   dextromethorphan-guaiFENesin (MUCINEX DM) 30-600 MG 12hr tablet Take 1 tablet by mouth 2 (two) times daily. 20 tablet Barrie Sigmund C, PA-C   albuterol (VENTOLIN HFA) 108 (90 Base) MCG/ACT inhaler Inhale 1-2 puffs into the lungs every 6 (six) hours as needed for wheezing or shortness of breath. 8 g Savannah Morford C, PA-C   predniSONE (DELTASONE) 50 MG tablet Take 1 tablet (50 mg total) by mouth daily for 5 days. 5 tablet Tomie Spizzirri, Rives C, PA-C     PDMP not reviewed this encounter.   Janith Lima, [place] 01/17/20 1516

## 2020-01-18 ENCOUNTER — Ambulatory Visit (INDEPENDENT_AMBULATORY_CARE_PROVIDER_SITE_OTHER): Payer: 59

## 2020-01-18 ENCOUNTER — Other Ambulatory Visit: Payer: Self-pay | Admitting: Gastroenterology

## 2020-01-18 DIAGNOSIS — Z1159 Encounter for screening for other viral diseases: Secondary | ICD-10-CM

## 2020-01-19 LAB — SARS CORONAVIRUS 2 (TAT 6-24 HRS): SARS Coronavirus 2: NEGATIVE

## 2020-01-20 ENCOUNTER — Telehealth: Payer: Self-pay | Admitting: Gastroenterology

## 2020-01-20 NOTE — Telephone Encounter (Signed)
Attempted to reach pt- no answer.  LMOM that if pt has not had a fever or any worsening symptoms he is ok to come for scheduled procedure.  He is instructed to call if he has any symptoms or fever

## 2020-01-21 ENCOUNTER — Other Ambulatory Visit: Payer: Self-pay

## 2020-01-21 ENCOUNTER — Encounter: Payer: Self-pay | Admitting: Gastroenterology

## 2020-01-21 ENCOUNTER — Ambulatory Visit (AMBULATORY_SURGERY_CENTER): Payer: 59 | Admitting: Gastroenterology

## 2020-01-21 VITALS — BP 104/63 | HR 53 | Temp 98.5°F | Resp 18 | Ht 73.0 in | Wt 262.0 lb

## 2020-01-21 DIAGNOSIS — Z86010 Personal history of colonic polyps: Secondary | ICD-10-CM | POA: Diagnosis present

## 2020-01-21 DIAGNOSIS — Z8371 Family history of colonic polyps: Secondary | ICD-10-CM

## 2020-01-21 MED ORDER — SODIUM CHLORIDE 0.9 % IV SOLN
500.0000 mL | Freq: Once | INTRAVENOUS | Status: DC
Start: 2020-01-21 — End: 2020-01-21

## 2020-01-21 NOTE — Progress Notes (Signed)
Report to PACU, RN, vss, BBS= Clear.  

## 2020-01-21 NOTE — Patient Instructions (Signed)
Discharge instructions given. Handouts on Diverticulosis and Hemorrhoids. Resume Eliquis at prior dose tomorrow. Resume previous medications. YOU HAD AN ENDOSCOPIC PROCEDURE TODAY AT [facility]:   Refer to the procedure report that was given to you for any specific questions about what was found during the examination.  If the procedure report does not answer your questions, please call your gastroenterologist to clarify.  If you requested that your care partner not be given the details of your procedure findings, then the procedure report has been included in a sealed envelope for you to review at your convenience later.  YOU SHOULD EXPECT: Some feelings of bloating in the abdomen. Passage of more gas than usual.  Walking can help get rid of the air that was put into your GI tract during the procedure and reduce the bloating. If you had a lower endoscopy (such as a colonoscopy or flexible sigmoidoscopy) you may notice spotting of blood in your stool or on the toilet paper. If you underwent a bowel prep for your procedure, you may not have a normal bowel movement for a few days.  Please Note:  You might notice some irritation and congestion in your nose or some drainage.  This is from the oxygen used during your procedure.  There is no need for concern and it should clear up in a day or so.  SYMPTOMS TO REPORT IMMEDIATELY:   Following lower endoscopy (colonoscopy or flexible sigmoidoscopy):  Excessive amounts of blood in the stool  Significant tenderness or worsening of abdominal pains  Swelling of the abdomen that is new, acute  Fever of 100F or higher   For urgent or emergent issues, a gastroenterologist can be reached at any hour by calling [phone]. Do not use MyChart messaging for urgent concerns.    DIET:  We do recommend a small meal at first, but then you may proceed to your regular diet.  Drink plenty of fluids but you should avoid alcoholic beverages for 24  hours.  ACTIVITY:  You should plan to take it easy for the rest of today and you should NOT DRIVE or use heavy machinery until tomorrow (because of the sedation medicines used during the test).    FOLLOW UP: Our staff will call the number listed on your records 48-72 hours following your procedure to check on you and address any questions or concerns that you may have regarding the information given to you following your procedure. If we do not reach you, we will leave a message.  We will attempt to reach you two times.  During this call, we will ask if you have developed any symptoms of COVID 19. If you develop any symptoms (ie: fever, flu-like symptoms, shortness of breath, cough etc.) before then, please call [phone].  If you test positive for Covid 19 in the 2 weeks post procedure, please call and report this information to [place].    If any biopsies were taken you will be contacted by phone or by letter within the next 1-3 weeks.  Please call us at [phone] if you have not heard about the biopsies in 3 weeks.    SIGNATURES/CONFIDENTIALITY: You and/or your care partner have signed paperwork which will be entered into your electronic medical record.  These signatures attest to the fact that that the information above on your After Visit Summary has been reviewed and is understood.  Full responsibility of the confidentiality of this discharge information lies with you and/or your care-partner.

## 2020-01-21 NOTE — Op Note (Signed)
[place] Patient Name: Ethan Lambert Procedure Date: 01/21/2020 1:45 PM MRN: [id number] Endoscopist: Ladene Artist , MD Age: 59 Referring MD:  Date of Birth: 26-Jan-1961 Gender: Male Account #: [id number] Procedure:                Colonoscopy Indications:              Surveillance: Personal history of adenomatous                            polyps on last colonoscopy 5 years ago. Family                            history of colon polyps. Medicines:                Monitored Anesthesia Care Procedure:                Pre-Anesthesia Assessment:                           - Prior to the procedure, a History and Physical                            was performed, and patient medications and                            allergies were reviewed. The patient's tolerance of                            previous anesthesia was also reviewed. The risks                            and benefits of the procedure and the sedation                            options and risks were discussed with the patient.                            All questions were answered, and informed consent                            was obtained. Prior Anticoagulants: The patient has                            taken Eliquis (apixaban), last dose was 2 days                            prior to procedure. ASA Grade Assessment: III - A                            patient with severe systemic disease. After                            reviewing the risks and benefits, the patient was  deemed in satisfactory condition to undergo the                            procedure.                           After obtaining informed consent, the colonoscope                            was passed under direct vision. Throughout the                            procedure, the patient's blood pressure, pulse, and                            oxygen saturations were monitored continuously. The                             Colonoscope was introduced through the anus and                            advanced to the the cecum, identified by                            appendiceal orifice and ileocecal valve. The                            ileocecal valve, appendiceal orifice, and rectum                            were photographed. The quality of the bowel                            preparation was excellent. The colonoscopy was                            performed without difficulty. The patient tolerated                            the procedure well. Scope In: 1:55:27 PM Scope Out: 2:06:34 PM Scope Withdrawal Time: 0 hours 9 minutes 23 seconds  Total Procedure Duration: 0 hours 11 minutes 7 seconds  Findings:                 The perianal and digital rectal examinations were                            normal.                           Multiple medium-mouthed diverticula were found in                            the left colon. There was no evidence of  diverticular bleeding.                           Internal hemorrhoids were found during                            retroflexion. The hemorrhoids were small and Grade                            I (internal hemorrhoids that do not prolapse).                           The exam was otherwise without abnormality on                            direct and retroflexion views. Complications:            No immediate complications. Estimated blood loss:                            None. Estimated Blood Loss:     Estimated blood loss: none. Impression:               - Moderate diverticulosis in the left colon.                           - Internal hemorrhoids.                           - The examination was otherwise normal on direct                            and retroflexion views.                           - No specimens collected. Recommendation:           - Repeat colonoscopy in 5 years for surveillance.                           - Patient has a  contact number available for                            emergencies. The signs and symptoms of potential                            delayed complications were discussed with the                            patient. Return to normal activities tomorrow.                            Written discharge instructions were provided to the                            patient.                           -  High fiber diet.                           - Continue present medications.                           - Resume Eliquis (apixaban) at prior dose tomorrow.                            Refer to managing physician for further adjustment                            of therapy. Ladene Artist, MD 01/21/2020 2:10:05 PM This report has been signed electronically.

## 2020-01-25 ENCOUNTER — Telehealth: Payer: Self-pay | Admitting: *Deleted

## 2020-01-25 ENCOUNTER — Telehealth: Payer: Self-pay

## 2020-01-25 NOTE — Telephone Encounter (Signed)
  Follow up Call-  Call back number 01/21/2020  Post procedure Call Back phone  # [phone]  Permission to leave phone message Yes  Some recent data might be hidden     No answer at 2nd attempt follow up phone call.  Left message on voicemail.

## 2020-01-25 NOTE — Telephone Encounter (Signed)
LVM

## 2020-02-04 ENCOUNTER — Other Ambulatory Visit: Payer: Self-pay | Admitting: Registered Nurse

## 2020-02-04 DIAGNOSIS — J069 Acute upper respiratory infection, unspecified: Secondary | ICD-10-CM

## 2020-02-21 ENCOUNTER — Ambulatory Visit: Payer: 59 | Admitting: Registered Nurse

## 2020-02-21 ENCOUNTER — Other Ambulatory Visit: Payer: Self-pay

## 2020-02-21 ENCOUNTER — Encounter: Payer: Self-pay | Admitting: Registered Nurse

## 2020-02-21 VITALS — BP 131/86 | HR 56 | Temp 98.3°F | Ht 73.0 in | Wt 260.8 lb

## 2020-02-21 DIAGNOSIS — H109 Unspecified conjunctivitis: Secondary | ICD-10-CM

## 2020-02-21 MED ORDER — ERYTHROMYCIN 5 MG/GM OP OINT: 1.0000 "application " | TOPICAL_OINTMENT | Freq: Every day | OPHTHALMIC | 0 refills | Status: DC

## 2020-02-21 NOTE — Patient Instructions (Signed)
° ° ° °  If you have lab work done today you will be contacted with your lab results within the next 2 weeks.  If you have not heard from us then please contact us. The fastest way to get your results is to register for My Chart. ° ° °IF you received an x-ray today, you will receive an invoice from [facility]. Please contact [facility] at [phone] with questions or concerns regarding your invoice.  ° °IF you received labwork today, you will receive an invoice from [facility]. Please contact [facility] at [phone] with questions or concerns regarding your invoice.  ° °Our billing staff will not be able to assist you with questions regarding bills from these companies. ° °You will be contacted with the lab results as soon as they are available. The fastest way to get your results is to activate your My Chart account. Instructions are located on the last page of this paperwork. If you have not heard from us regarding the results in 2 weeks, please contact this office. °  ° ° ° °

## 2020-03-09 ENCOUNTER — Telehealth: Payer: Self-pay

## 2020-03-09 NOTE — Telephone Encounter (Signed)
Patient states his mail order pharmacy is currently on a delay and he is now 7 days without medication. Patient states mail order pharmacy will send by air tomorrow. Patient requesting 7 day supply of [facility]q [facility] to local [facility] which insurance [facility] cover. Routing to MD for approval. Preferred pharmacy [facility] on [address]. Eugenia Mcalpine

## 2020-03-10 ENCOUNTER — Other Ambulatory Visit: Payer: Self-pay | Admitting: Internal Medicine

## 2020-03-10 DIAGNOSIS — B20 Human immunodeficiency virus [HIV] disease: Secondary | ICD-10-CM

## 2020-03-10 MED ORDER — TRIUMEQ 600-50-300 MG PO TABS: 1.0000 | ORAL_TABLET | Freq: Every day | ORAL | 0 refills | Status: DC

## 2020-03-10 NOTE — Telephone Encounter (Signed)
Connected with [facility] on [address]. PharmD states they have already received a verbal order from Dr. Megan Salon. This issue now is that [facility] does not [facility] in [facility]. Per [facility] [address] [facility] medication is in stock. [facility] on [address] and patient made aware via voicemail. Advise patient to contact the office with any issues.Will also send patient a MyChart message.  Eugenia Mcalpine

## 2020-03-10 NOTE — Telephone Encounter (Signed)
Yes, definitely

## 2020-04-01 ENCOUNTER — Other Ambulatory Visit: Payer: Self-pay | Admitting: Internal Medicine

## 2020-04-01 DIAGNOSIS — B20 Human immunodeficiency virus [HIV] disease: Secondary | ICD-10-CM

## 2020-04-12 ENCOUNTER — Encounter: Payer: Self-pay | Admitting: Registered Nurse

## 2020-04-12 ENCOUNTER — Ambulatory Visit (INDEPENDENT_AMBULATORY_CARE_PROVIDER_SITE_OTHER): Payer: 59

## 2020-04-12 ENCOUNTER — Other Ambulatory Visit: Payer: Self-pay

## 2020-04-12 ENCOUNTER — Ambulatory Visit: Payer: 59 | Admitting: Registered Nurse

## 2020-04-12 VITALS — BP 136/85 | HR 60 | Temp 98.3°F | Resp 18 | Ht 73.0 in | Wt 257.6 lb

## 2020-04-12 DIAGNOSIS — M25571 Pain in right ankle and joints of right foot: Secondary | ICD-10-CM | POA: Diagnosis not present

## 2020-04-12 DIAGNOSIS — S99911A Unspecified injury of right ankle, initial encounter: Secondary | ICD-10-CM

## 2020-04-12 DIAGNOSIS — Z23 Encounter for immunization: Secondary | ICD-10-CM

## 2020-04-12 MED ORDER — CELECOXIB 100 MG PO CAPS: 100.0000 mg | ORAL_CAPSULE | Freq: Two times a day (BID) | ORAL | 0 refills | Status: DC

## 2020-04-12 NOTE — Patient Instructions (Addendum)
If you have lab work done today you will be contacted with your lab results within the next 2 weeks.  If you have not heard from [place] then please contact us. The fastest way to get your results is to register for My Chart.   IF you received an x-ray today, you will receive an invoice from [facility]. Please contact [facility] at [phone] with questions or concerns regarding your invoice.   IF you received labwork today, you will receive an invoice from [place]. Please contact [facility] at [phone] with questions or concerns regarding your invoice.   Our billing staff will not be able to assist you with questions regarding bills from these companies.  You will be contacted with the lab results as soon as they are available. The fastest way to get your results is to activate your My Chart account. Instructions are located on the last page of this paperwork. If you have not heard from [place] regarding the results in 2 weeks, please contact this office.      Ankle Sprain, Phase I Rehab An ankle sprain is an injury to the ligaments of your ankle. Ankle sprains cause stiffness, loss of motion, and loss of strength. Ask your health care provider which exercises are safe for you. Do exercises exactly as told by your health care provider and adjust them as directed. It is normal to feel mild stretching, pulling, tightness, or discomfort as you do these exercises. Stop right away if you feel sudden pain or your pain gets worse. Do not begin these exercises until told by your health care provider. Stretching and range-of-motion exercises These exercises warm up your muscles and joints and improve the movement and flexibility of your lower leg and ankle. These exercises also help to relieve pain and stiffness. Gastroc and soleus stretch This exercise is also called a calf stretch. It stretches the muscles in the back of the lower leg. These muscles are the gastrocnemius, or  gastroc, and the soleus. 1. Sit on the floor with your left / right leg extended. 2. Loop a belt or towel around the ball of your left / right foot. The ball of your foot is on the walking surface, right under your toes. 3. Keep your left / right ankle and foot relaxed and keep your knee straight while you use the belt or towel to pull your foot toward you. You should feel a gentle stretch behind your calf or knee in your gastroc muscle. 4. Hold this position for __________ seconds, then release to the starting position. 5. Repeat the exercise with your knee bent. You can put a pillow or a rolled bath towel under your knee to support it. You should feel a stretch deep in your calf in the soleus muscle or at your Achilles tendon. Repeat __________ times. Complete this exercise __________ times a day. Ankle alphabet  1. Sit with your left / right leg supported at the lower leg. ? Do not rest your foot on anything. ? Make sure your foot has room to move freely. 2. Think of your left / right foot as a paintbrush. ? Move your foot to trace each letter of the alphabet in the air. Keep your hip and knee still while you trace. ? Make the letters as large as you can without feeling discomfort. 3. Trace every letter from A to Z. Repeat __________ times. Complete this exercise __________ times a day. Strengthening exercises These exercises build strength and endurance  in your ankle and lower leg. Endurance is the ability to use your muscles for a long time, even after they get tired. Ankle dorsiflexion  1. Secure a rubber exercise band or tube to an object, such as a table leg, that will stay still when the band is pulled. Secure the other end around your left / right foot. 2. Sit on the floor facing the object, with your left / right leg extended. The band or tube should be slightly tense when your foot is relaxed. 3. Slowly bring your foot toward you, bringing the top of your foot toward your shin  (dorsiflexion), and pulling the band tighter. 4. Hold this position for __________ seconds. 5. Slowly return your foot to the starting position. Repeat __________ times. Complete this exercise __________ times a day. Ankle plantar flexion  1. Sit on the floor with your left / right leg extended. 2. Loop a rubber exercise tube or band around the ball of your left / right foot. The ball of your foot is on the walking surface, right under your toes. ? Hold the ends of the band or tube in your hands. ? The band or tube should be slightly tense when your foot is relaxed. 3. Slowly point your foot and toes downward to tilt the top of your foot away from your shin (plantar flexion). 4. Hold this position for __________ seconds. 5. Slowly return your foot to the starting position. Repeat __________ times. Complete this exercise __________ times a day. Ankle eversion 1. Sit on the floor with your legs straight out in front of you. 2. Loop a rubber exercise band or tube around the ball of your left / right foot. The ball of your foot is on the walking surface, right under your toes. ? Hold the ends of the band in your hands, or secure the band to a stable object. ? The band or tube should be slightly tense when your foot is relaxed. 3. Slowly push your foot outward, away from your other leg (eversion). 4. Hold this position for __________ seconds. 5. Slowly return your foot to the starting position. Repeat __________ times. Complete this exercise __________ times a day. This information is not intended to replace advice given to you by your health care provider. Make sure you discuss any questions you have with your health care provider. Document Revised: 09/29/2018 Document Reviewed: 03/23/2018 Elsevier Patient Education  [place].  Ankle Sprain, Phase II Rehab An ankle sprain is an injury to tissue that connects bone to bone (a ligament) in the ankle. Ankle sprains usually cause  stiffness, loss of motion, and loss of strength. Ask your health care provider which exercises are safe for you. Do exercises exactly as told by your health care provider and adjust them as directed. It is normal to feel mild stretching, pulling, tightness, or discomfort as you do these exercises. Stop right away if you feel sudden pain or your pain gets worse. Do not begin these exercises until told by your health care provider. Stretching and range-of-motion exercises These exercises warm up your muscles and joints and improve the movement and flexibility of your lower leg and ankle. These exercises also help to relieve pain and stiffness. Standing gastroc stretch This exercise is also called a standing calf (gastroc) stretch. 1. Stand with your hands against a wall. 2. Extend your left / right leg behind you, and bend your front knee slightly. Your heels should be on the floor. 3. Keeping your  heels on the floor and your back knee straight, shift your weight toward the wall. You should feel a gentle stretch in the back of your lower leg (calf). 4. Hold this position for __________ seconds. Repeat __________ times. Complete this exercise __________ times a day. Standing soleus stretch This exercise is also called a standing calf (soleus) stretch. 1. Stand with your hands against a wall. 2. Extend your left / right leg behind you, and bend your front knee slightly. Both of your heels should be on the floor. 3. Keeping your heels on the floor, bend your back knee and shift your weight slightly over your back leg. You should feel a gentle stretch deep in your calf. 4. Hold this position for __________ seconds. Repeat __________ times. Complete this exercise __________ times a day. Strengthening exercises These exercises build strength and endurance in your lower leg. Endurance is the ability to use your muscles for a long time, even after they get tired. Heel walking  This exercise is sometimes  called dorsiflexion. 1. Walk on your heels for __________ seconds or ___________ ft. Keep your toes as high as possible. Repeat __________ times. Complete this exercise __________ times a day. Balance exercises These exercises improve your balance and the reaction and control of your ankle to help improve stability. Multi-angle lunge 1. Stand with your feet together. 2. Take a step forward with your left / right leg, and shift your weight onto that leg. Your back heel will come off the floor, and your back toes will stay in place. 3. Push off your front leg to return your front foot to the starting position next to your other foot. 4. Repeat to the side, to the back, and any other directions as told by your health care provider. Repeat __________ times. Complete this exercise __________ times a day. Single leg stand If this exercise is too easy, you can try it with your eyes closed or while standing on a pillow. 1. Without shoes, stand near a railing or in a door frame. Hold on to the railing or door frame as needed. Let loose of the railing or door frame as you are able. 2. Stand on your left / right foot. Keep your big toe down on the floor and try to keep your arch lifted. 3. Hold this position for __________ seconds. Repeat __________ times. Complete this exercise __________ times a day. Ankle inversion and eversion This exercise is also called foot rotation with a balance board. This exercise uses a balance board to rotate the foot and ankle inward (inversion) and outward (eversion). Ask your health care provider where you can get a balance board or how you can make one. 1. Stand on a non-carpeted surface near a countertop or wall. 2. Step onto the balance board so your feet are hip width apart. 3. Keep your feet in place and keep your upper body and hips steady. 4. Using only your feet and ankles to move the board, do the following exercises as told by your health care provider: ? Tip  the board side to side as far as you can, alternating between tipping to the left and tipping to the right. ? Tip the board so it silently taps the floor. Do not let the board forcefully hit the floor. ? From time to time, pause to hold a steady midway position, with neither the right nor the left sides touching the ground. ? Tip the board side to side so the board does not  hit the floor at all. From time to time, pause to hold a steady midway position. Repeat __________ times. Complete this exercise __________ times a day. Ankle plantar flexion and dorsiflexion This exercise is also called foot flexion with a balance board. This exercise uses a balance board to push the foot downward and away from the leg (plantar flexion) or upward and toward the leg (dorsiflexion). Ask your health care provider where you can get a balance board or how you can make one. 1. Stand on a non-carpeted surface near a countertop or wall. 2. Step onto the balance board so your feet are hip width apart. 3. Keep your feet in place and keep your upper body and hips steady. 4. Using only your feet and ankles to move the board, do one or both of the following exercises as told by your health care provider: ? Tip the board forward and backward so the board silently taps the floor. Do not let the board forcefully hit the floor. ? From time to time, pause to hold a steady position midway between touching the floor in front and touching the floor in back. ? Tip the board forward and backward so the board does not hit the floor at all. From time to time, pause to hold a steady position in the middle. Repeat __________ times. Complete this exercise __________ times a day. This information is not intended to replace advice given to you by your health care provider. Make sure you discuss any questions you have with your health care provider. Document Revised: 09/29/2018 Document Reviewed: 03/23/2018 Elsevier Patient Education  [place].  Ankle Sprain  An ankle sprain is a stretch or tear in a ligament in the ankle. Ligaments are tissues that connect bones to each other. The two most common types of ankle sprains are:  Inversion sprain. This happens when the foot turns inward and the ankle rolls outward. It affects the ligament on the outside of the foot (lateral ligament).  Eversion sprain. This happens when the foot turns outward and the ankle rolls inward. It affects the ligament on the inner side of the foot (medial ligament). What are the causes? This condition is often caused by accidentally rolling or twisting the ankle. What increases the risk? You are more likely to develop this condition if you play sports. What are the signs or symptoms? Symptoms of this condition include:  Pain in your ankle.  Swelling.  Bruising. This may develop right after you sprain your ankle or 1-2 days later.  Trouble standing or walking, especially when you turn or change directions. How is this diagnosed? This condition is diagnosed with:  A physical exam. During the exam, your health care provider will press on certain parts of your foot and ankle and try to move them in certain ways.  X-ray imaging. These may be taken to see how severe the sprain is and to check for broken bones. How is this treated? This condition may be treated with:  A brace or splint. This is used to keep the ankle from moving until it heals.  An elastic bandage. This is used to support the ankle.  Crutches.  Pain medicine.  Surgery. This may be needed if the sprain is severe.  Physical therapy. This may help to improve the range of motion in the ankle. Follow these instructions at home: If you have a brace or a splint:  Wear the brace or splint as told by your health care provider.  Remove it only as told by your health care provider.  Loosen the brace or splint if your toes tingle, become numb, or turn cold and blue.  Keep  the brace or splint clean.  If the brace or splint is not waterproof: ? Do not let it get wet. ? Cover it with a watertight covering when you take a bath or a shower. If you have an elastic bandage (dressing):  Remove it to shower or bathe.  Try not to move your ankle much, but wiggle your toes from time to time. This helps to prevent swelling.  Adjust the dressing to make it more comfortable if it feels too tight.  Loosen the dressing if you have numbness or tingling in your foot, or if your foot becomes cold and blue. Managing pain, stiffness, and swelling   Take over-the-counter and prescription medicines only as told by your health care provider.  For 2-3 days, keep your ankle raised (elevated) above the level of your heart as much as possible.  If directed, put ice on the injured area: ? If you have a removable brace or splint, remove it as told by your health care provider. ? Put ice in a plastic bag. ? Place a towel between your skin and the bag. ? Leave the ice on for 20 minutes, 2-3 times a day. General instructions  Rest your ankle.  Do not use the injured limb to support your body weight until your health care provider says that you can. Use crutches as told by your health care provider.  Do not use any products that contain nicotine or tobacco, such as cigarettes, e-cigarettes, and chewing tobacco. If you need help quitting, ask your health care provider.  Keep all follow-up visits as told by your health care provider. This is important. Contact a health care provider if:  You have rapidly increasing bruising or swelling.  Your pain is not relieved with medicine. Get help right away if:  Your foot or toes become numb or blue.  You have severe pain that gets worse. Summary  An ankle sprain is a stretch or tear in a ligament in the ankle. Ligaments are tissues that connect bones to each other.  This condition is often caused by accidentally rolling or  twisting the ankle.  Symptoms include pain, swelling, bruising, and trouble walking.  To relieve pain and swelling, put ice on the affected ankle, raise your ankle above the level of your heart, and use an elastic bandage.  Keep all follow-up visits as told by your health care provider. This is important. This information is not intended to replace advice given to you by your health care provider. Make sure you discuss any questions you have with your health care provider. Document Revised: 03/02/2018 Document Reviewed: 11/04/2017 Elsevier Patient Education  [place].  Ankle Sprain  An ankle sprain is a stretch or tear in a ligament in the ankle. Ligaments are tissues that connect bones to each other. The two most common types of ankle sprains are:  Inversion sprain. This happens when the foot turns inward and the ankle rolls outward. It affects the ligament on the outside of the foot (lateral ligament).  Eversion sprain. This happens when the foot turns outward and the ankle rolls inward. It affects the ligament on the inner side of the foot (medial ligament). What are the causes? This condition is often caused by accidentally rolling or twisting the ankle. What increases the risk? You are more likely  to develop this condition if you play sports. What are the signs or symptoms? Symptoms of this condition include:  Pain in your ankle.  Swelling.  Bruising. This may develop right after you sprain your ankle or 1-2 days later.  Trouble standing or walking, especially when you turn or change directions. How is this diagnosed? This condition is diagnosed with:  A physical exam. During the exam, your health care provider will press on certain parts of your foot and ankle and try to move them in certain ways.  X-ray imaging. These may be taken to see how severe the sprain is and to check for broken bones. How is this treated? This condition may be treated with:  A brace  or splint. This is used to keep the ankle from moving until it heals.  An elastic bandage. This is used to support the ankle.  Crutches.  Pain medicine.  Surgery. This may be needed if the sprain is severe.  Physical therapy. This may help to improve the range of motion in the ankle. Follow these instructions at home: If you have a brace or a splint:  Wear the brace or splint as told by your health care provider. Remove it only as told by your health care provider.  Loosen the brace or splint if your toes tingle, become numb, or turn cold and blue.  Keep the brace or splint clean.  If the brace or splint is not waterproof: ? Do not let it get wet. ? Cover it with a watertight covering when you take a bath or a shower. If you have an elastic bandage (dressing):  Remove it to shower or bathe.  Try not to move your ankle much, but wiggle your toes from time to time. This helps to prevent swelling.  Adjust the dressing to make it more comfortable if it feels too tight.  Loosen the dressing if you have numbness or tingling in your foot, or if your foot becomes cold and blue. Managing pain, stiffness, and swelling   Take over-the-counter and prescription medicines only as told by your health care provider.  For 2-3 days, keep your ankle raised (elevated) above the level of your heart as much as possible.  If directed, put ice on the injured area: ? If you have a removable brace or splint, remove it as told by your health care provider. ? Put ice in a plastic bag. ? Place a towel between your skin and the bag. ? Leave the ice on for 20 minutes, 2-3 times a day. General instructions  Rest your ankle.  Do not use the injured limb to support your body weight until your health care provider says that you can. Use crutches as told by your health care provider.  Do not use any products that contain nicotine or tobacco, such as cigarettes, e-cigarettes, and chewing tobacco. If you  need help quitting, ask your health care provider.  Keep all follow-up visits as told by your health care provider. This is important. Contact a health care provider if:  You have rapidly increasing bruising or swelling.  Your pain is not relieved with medicine. Get help right away if:  Your foot or toes become numb or blue.  You have severe pain that gets worse. Summary  An ankle sprain is a stretch or tear in a ligament in the ankle. Ligaments are tissues that connect bones to each other.  This condition is often caused by accidentally rolling or twisting the ankle.  Symptoms include pain, swelling, bruising, and trouble walking.  To relieve pain and swelling, put ice on the affected ankle, raise your ankle above the level of your heart, and use an elastic bandage.  Keep all follow-up visits as told by your health care provider. This is important. This information is not intended to replace advice given to you by your health care provider. Make sure you discuss any questions you have with your health care provider. Document Revised: 03/02/2018 Document Reviewed: 11/04/2017 Elsevier Patient Education  [place].  Ankle Sprain, Phase I Rehab An ankle sprain is an injury to the ligaments of your ankle. Ankle sprains cause stiffness, loss of motion, and loss of strength. Ask your health care provider which exercises are safe for you. Do exercises exactly as told by your health care provider and adjust them as directed. It is normal to feel mild stretching, pulling, tightness, or discomfort as you do these exercises. Stop right away if you feel sudden pain or your pain gets worse. Do not begin these exercises until told by your health care provider. Stretching and range-of-motion exercises These exercises warm up your muscles and joints and improve the movement and flexibility of your lower leg and ankle. These exercises also help to relieve pain and stiffness. Gastroc and  soleus stretch This exercise is also called a calf stretch. It stretches the muscles in the back of the lower leg. These muscles are the gastrocnemius, or gastroc, and the soleus. 5. Sit on the floor with your left / right leg extended. 6. Loop a belt or towel around the ball of your left / right foot. The ball of your foot is on the walking surface, right under your toes. 7. Keep your left / right ankle and foot relaxed and keep your knee straight while you use the belt or towel to pull your foot toward you. You should feel a gentle stretch behind your calf or knee in your gastroc muscle. 8. Hold this position for __________ seconds, then release to the starting position. 9. Repeat the exercise with your knee bent. You can put a pillow or a rolled bath towel under your knee to support it. You should feel a stretch deep in your calf in the soleus muscle or at your Achilles tendon. Repeat __________ times. Complete this exercise __________ times a day. Ankle alphabet  9. Sit with your left / right leg supported at the lower leg. ? Do not rest your foot on anything. ? Make sure your foot has room to move freely. 10. Think of your left / right foot as a paintbrush. ? Move your foot to trace each letter of the alphabet in the air. Keep your hip and knee still while you trace. ? Make the letters as large as you can without feeling discomfort. 11. Trace every letter from A to Z. Repeat __________ times. Complete this exercise __________ times a day. Strengthening exercises These exercises build strength and endurance in your ankle and lower leg. Endurance is the ability to use your muscles for a long time, even after they get tired. Ankle dorsiflexion  5. Secure a rubber exercise band or tube to an object, such as a table leg, that will stay still when the band is pulled. Secure the other end around your left / right foot. 6. Sit on the floor facing the object, with your left / right leg extended.  The band or tube should be slightly tense when your foot is relaxed. 7.  Slowly bring your foot toward you, bringing the top of your foot toward your shin (dorsiflexion), and pulling the band tighter. 8. Hold this position for __________ seconds. 9. Slowly return your foot to the starting position. Repeat __________ times. Complete this exercise __________ times a day. Ankle plantar flexion  13. Sit on the floor with your left / right leg extended. 14. Loop a rubber exercise tube or band around the ball of your left / right foot. The ball of your foot is on the walking surface, right under your toes. ? Hold the ends of the band or tube in your hands. ? The band or tube should be slightly tense when your foot is relaxed. 15. Slowly point your foot and toes downward to tilt the top of your foot away from your shin (plantar flexion). 16. Hold this position for __________ seconds. 39. Slowly return your foot to the starting position. Repeat __________ times. Complete this exercise __________ times a day. Ankle eversion 9. Sit on the floor with your legs straight out in front of you. 10. Loop a rubber exercise band or tube around the ball of your left / right foot. The ball of your foot is on the walking surface, right under your toes. ? Hold the ends of the band in your hands, or secure the band to a stable object. ? The band or tube should be slightly tense when your foot is relaxed. 11. Slowly push your foot outward, away from your other leg (eversion). 12. Hold this position for __________ seconds. 13. Slowly return your foot to the starting position. Repeat __________ times. Complete this exercise __________ times a day. This information is not intended to replace advice given to you by your health care provider. Make sure you discuss any questions you have with your health care provider. Document Revised: 09/29/2018 Document Reviewed: 03/23/2018 Elsevier Patient Education  [place].  Ankle Sprain, Phase II Rehab An ankle sprain is an injury to tissue that connects bone to bone (a ligament) in the ankle. Ankle sprains usually cause stiffness, loss of motion, and loss of strength. Ask your health care provider which exercises are safe for you. Do exercises exactly as told by your health care provider and adjust them as directed. It is normal to feel mild stretching, pulling, tightness, or discomfort as you do these exercises. Stop right away if you feel sudden pain or your pain gets worse. Do not begin these exercises until told by your health care provider. Stretching and range-of-motion exercises These exercises warm up your muscles and joints and improve the movement and flexibility of your lower leg and ankle. These exercises also help to relieve pain and stiffness. Standing gastroc stretch This exercise is also called a standing calf (gastroc) stretch. 5. Stand with your hands against a wall. 6. Extend your left / right leg behind you, and bend your front knee slightly. Your heels should be on the floor. 7. Keeping your heels on the floor and your back knee straight, shift your weight toward the wall. You should feel a gentle stretch in the back of your lower leg (calf). 8. Hold this position for __________ seconds. Repeat __________ times. Complete this exercise __________ times a day. Standing soleus stretch This exercise is also called a standing calf (soleus) stretch. 5. Stand with your hands against a wall. 6. Extend your left / right leg behind you, and bend your front knee slightly. Both of your heels should be on the floor.  7. Keeping your heels on the floor, bend your back knee and shift your weight slightly over your back leg. You should feel a gentle stretch deep in your calf. 8. Hold this position for __________ seconds. Repeat __________ times. Complete this exercise __________ times a day. Strengthening exercises These exercises build strength and  endurance in your lower leg. Endurance is the ability to use your muscles for a long time, even after they get tired. Heel walking  This exercise is sometimes called dorsiflexion. 2. Walk on your heels for __________ seconds or ___________ ft. Keep your toes as high as possible. Repeat __________ times. Complete this exercise __________ times a day. Balance exercises These exercises improve your balance and the reaction and control of your ankle to help improve stability. Multi-angle lunge 5. Stand with your feet together. 6. Take a step forward with your left / right leg, and shift your weight onto that leg. Your back heel will come off the floor, and your back toes will stay in place. 7. Push off your front leg to return your front foot to the starting position next to your other foot. 8. Repeat to the side, to the back, and any other directions as told by your health care provider. Repeat __________ times. Complete this exercise __________ times a day. Single leg stand If this exercise is too easy, you can try it with your eyes closed or while standing on a pillow. 4. Without shoes, stand near a railing or in a door frame. Hold on to the railing or door frame as needed. Let loose of the railing or door frame as you are able. 5. Stand on your left / right foot. Keep your big toe down on the floor and try to keep your arch lifted. 6. Hold this position for __________ seconds. Repeat __________ times. Complete this exercise __________ times a day. Ankle inversion and eversion This exercise is also called foot rotation with a balance board. This exercise uses a balance board to rotate the foot and ankle inward (inversion) and outward (eversion). Ask your health care provider where you can get a balance board or how you can make one. 5. Stand on a non-carpeted surface near a countertop or wall. 6. Step onto the balance board so your feet are hip width apart. 7. Keep your feet in place and keep  your upper body and hips steady. 8. Using only your feet and ankles to move the board, do the following exercises as told by your health care provider: ? Tip the board side to side as far as you can, alternating between tipping to the left and tipping to the right. ? Tip the board so it silently taps the floor. Do not let the board forcefully hit the floor. ? From time to time, pause to hold a steady midway position, with neither the right nor the left sides touching the ground. ? Tip the board side to side so the board does not hit the floor at all. From time to time, pause to hold a steady midway position. Repeat __________ times. Complete this exercise __________ times a day. Ankle plantar flexion and dorsiflexion This exercise is also called foot flexion with a balance board. This exercise uses a balance board to push the foot downward and away from the leg (plantar flexion) or upward and toward the leg (dorsiflexion). Ask your health care provider where you can get a balance board or how you can make one. 5. Stand on a non-carpeted surface  near a countertop or wall. 6. Step onto the balance board so your feet are hip width apart. 7. Keep your feet in place and keep your upper body and hips steady. 8. Using only your feet and ankles to move the board, do one or both of the following exercises as told by your health care provider: ? Tip the board forward and backward so the board silently taps the floor. Do not let the board forcefully hit the floor. ? From time to time, pause to hold a steady position midway between touching the floor in front and touching the floor in back. ? Tip the board forward and backward so the board does not hit the floor at all. From time to time, pause to hold a steady position in the middle. Repeat __________ times. Complete this exercise __________ times a day. This information is not intended to replace advice given to you by your health care provider. Make sure you  discuss any questions you have with your health care provider. Document Revised: 09/29/2018 Document Reviewed: 03/23/2018 Elsevier Patient Education  2020 [employer].

## 2020-04-19 ENCOUNTER — Other Ambulatory Visit: Payer: Self-pay | Admitting: Internal Medicine

## 2020-04-23 ENCOUNTER — Encounter: Payer: Self-pay | Admitting: Registered Nurse

## 2020-04-23 NOTE — Progress Notes (Signed)
Acute Office Visit  Subjective:    Patient ID: Ethan Lambert, male    DOB: 1961-02-18, 59 y.o.   MRN: [id number]  Chief Complaint  Patient presents with  . swelling under right eye    started this morning   . currently on A-Fib Fl[place]ters    medication left at home     HPI Patient is in today for local swelling of R eye  Eye is red, itching, swollen, mildly painful. Had thick drainage on waking. Other eye not affected. Vision not affected. No deep pain or aching. No headaches.   Does note that he feels he has entered some afib  - has flecainide at home, has been taking that to control afib. No chest pain, shob, doe, or other symptoms at this time.  Past Medical History:  Diagnosis Date  . Anemia   . Arthritis    "probably right ring finger joint" (05/28/2018)  . Childhood asthma    as child none now  . Clotting disorder ([facility])   . CVA (cerebral vascular accident) ([facility]) 1999 X 2   LEFT FRONTAL & OCCIPITAL , NON-HEMORRHAGIC; no residual  . Family history of malignant neoplasm of gastrointestinal tract   . Heart murmur   . History of blood transfusion 1999   "related to the stroke"  . History of kidney stones   . HIV disease ([facility]) dx 1999   MONITORED BY INFECTIOUS DISEASE -- DR Brilliant  . Hypertension   . Paroxysmal atrial fibrillation ([facility])    CARDIOLOGIST--  DR Rayann Heman  . Right knee meniscal tear   . Seizures ([facility])    1999  . Skin cancer    skin  . Sleep apnea    "didn't follow thru w/mask" (05/28/2018)    Past Surgical History:  Procedure Laterality Date  . APPENDECTOMY  06-09-2003  . ATRIAL FIBRILLATION ABLATION  05/28/2018  . ATRIAL FIBRILLATION ABLATION N/A 05/28/2018   Procedure: ATRIAL FIBRILLATION ABLATION;  Surgeon: Thompson Grayer, MD;  Location: [facility];  Service: Cardiovascular;  Laterality: N/A;  . BACK SURGERY    . INGUINAL HERNIA REPAIR Bilateral 1990s  . KNEE ARTHROSCOPY WITH MEDIAL MENISECTOMY Right 12/31/2013   Procedure: RIGHT KNEE  ARTHROSCOPY PARTIAL MEDIAL MENISCECTOMY DEBRIDEMENT AND CHONDROPLASTY ;  Surgeon: Sydnee Cabal, MD;  Location: [facility];  Service: Orthopedics;  Laterality: Right;  . KNEE ARTHROSCOPY WITH MEDIAL MENISECTOMY Left 01/27/2019   Procedure: LEFT KNEE ARTHROSCOPY WITH PARTIAL MEDIAL MENISCECTOMY;  Surgeon: Leandrew Koyanagi, MD;  Location: [facility];  Service: Orthopedics;  Laterality: Left;  . LAPAROSCOPIC CHOLECYSTECTOMY  07-08-2003  . LASER ABLATION ANAL CONDYLOMA  05-18-2001  . LUMBAR LAMINECTOMY/DECOMPRESSION MICRODISCECTOMY Left 03/15/2015   Procedure: Left Lumbar five- sacral one microdiskectomy;  Surgeon: Consuella Lose, MD;  Location: [facility];  Service: Neurosurgery;  Laterality: Left;  Left L5S1 microdiskectomy  . NEGATIVE SLEEP STUDY  01-03-2012  in epic  . SKIN CANCER EXCISION Right    "cut it off my shoulder"  . TRANSTHORACIC ECHOCARDIOGRAM  12-01-2008   MODERATE LVH/  EF 55-60%/  MILD MR/  NEGATIVE BUBBLE STUDY    Family History  Problem Relation Age of Onset  . Arrhythmia Father   . Melanoma Father   . Heart attack Brother   . Prostate cancer Maternal Grandfather   . Colon cancer Maternal Grandfather   . Lung cancer Mother        smoker  . Hypertension Mother   . Hyperlipidemia  Mother   . Other Maternal Grandmother        harding of the arteries  . Rectal cancer Neg Hx   . Stomach cancer Neg Hx     Social History   Socioeconomic History  . Marital status: Married    Spouse name: Not on file  . Number of children: 1  . Years of education: Not on file  . Highest education level: Not on file  Occupational History  . 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 Smoking status: Never Smoker  . Smokeless tobacco: Never Used  Vaping Use  . Vaping Use: Never used  Substance and Sexual Activity  . Alcohol use: Yes    Alcohol/week: 0.0 standard drinks    Comment: 05/28/2018 "rarely; 1-2 per month"   . Drug use: Never  . Sexual activity: Yes    Partners: Male  Other Topics Concern  . Not on file  Social History Narrative   Pt lives in [place]. 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 Difficulty of Paying Living Expenses: Not on file  Food Insecurity:   . Worried About Charity fundraiser in the Last Year: Not on file  . Ran Out of Food in the Last Year: Not on file  Transportation Needs:   . Lack of Transportation (Medical): Not on file  . Lack of Transportation (Non-Medical): Not on file  Physical Activity:   . Days of Exercise per Week: Not on file  . Minutes of Exercise per Session: Not on file  Stress:   . Feeling of Stress : Not on file  Social Connections:   . Frequency of Communication with Friends and Family: Not on file  . Frequency of Social Gatherings with Friends and Family: Not on file  . Attends Religious Services: Not on file  . Active Member of Clubs or Organizations: Not on file  . Attends Archivist Meetings: Not on file  . Marital Status: Not on file  Intimate Partner Violence:   . Fear of Current or Ex-Partner: Not on file  . Emotionally Abused: Not on file  . Physically Abused: Not on file  . Sexually Abused: Not on file    Outpatient Medications Prior to Visit  Medication Sig Dispense Refill  . albuterol (VENTOLIN HFA) 108 (90 Base) MCG/ACT inhaler Inhale 1-2 puffs into the lungs every 6 (six) hours as needed for wheezing or shortness of breath. 8 g 0  . ELIQUIS 5 MG TABS tablet TAKE 1 TABLET BY MOUTH  TWICE DAILY 180 tablet 1  . flecainide (TAMBOCOR) 150 MG tablet Takes 2 tablets - prn for his A-fib episodes     . abacavir-dolutegravir-lamiVUDine (TRIUMEQ) 600-50-300 MG tablet Take 1 tablet by mouth daily. 30 tablet 11  . amoxicillin-clavulanate (AUGMENTIN) 875-125 MG tablet Take 1 tablet by mouth 2 (two) times daily. 20 tablet 0  . dextromethorphan-guaiFENesin (MUCINEX  DM) 30-600 MG 12hr tablet Take 1 tablet by mouth 2 (two) times daily. 20 tablet 0  . diltiazem (CARDIZEM CD) 240 MG 24 hr capsule Take 1 capsule (240 mg total) by mouth daily. 90 capsule 2  . Olopatadine HCl 0.6 % SOLN PLACE 1 SPRAY INTO THE NOSE DAILY. 30.5 g 0   No facility-administered medications prior to visit.    Allergies  Allergen Reactions  . Morphine Other (See Comments)    hallucinations    Review of  Systems  Constitutional: Negative.   HENT: Negative.   Eyes: Positive for pain, discharge, redness and itching. Negative for photophobia and visual disturbance.  Respiratory: Negative.   Cardiovascular: Negative.   Gastrointestinal: Negative.   Genitourinary: Negative.   Musculoskeletal: Negative.   Skin: Negative.   Neurological: Negative.   Psychiatric/Behavioral: Negative.        Objective:    Physical Exam Vitals and nursing note reviewed.  Eyes:     General: No scleral icterus.       Right eye: Discharge present.        Left eye: No discharge.     Extraocular Movements: Extraocular movements intact.     Pupils: Pupils are equal, round, and reactive to light.  Cardiovascular:     Rate and Rhythm: Normal rate. Rhythm irregular.     Pulses: Normal pulses.  Pulmonary:     Effort: Pulmonary effort is normal.     Breath sounds: Normal breath sounds.  Skin:    General: Skin is warm and dry.     Capillary Refill: Capillary refill takes less than 2 seconds.  Neurological:     General: No focal deficit present.     Mental Status: He is oriented to person, place, and time. Mental status is at baseline.  Psychiatric:        Mood and Affect: Mood normal.        Behavior: Behavior normal.        Thought Content: Thought content normal.        Judgment: Judgment normal.     BP 131/86   Pulse (!) 56   Temp 98.3 F (36.8 C) (Temporal)   Ht 6\' 1"  (1.854 m)   Wt 260 lb 12.8 oz (118.3 kg)   SpO2 97%   BMI 34.41 kg/m  Wt Readings from Last 3 Encounters:   04/12/20 257 lb 9.6 oz (116.8 kg)  02/21/20 260 lb 12.8 oz (118.3 kg)  01/21/20 (!) 262 lb (118.8 kg)    There are no preventive care reminders to display for this patient.  There are no preventive care reminders to display for this patient.    Lab Results  Component Value Date   WBC 7.6 10/25/2019   HGB 15.5 10/25/2019   HCT 44.5 10/25/2019   MCV 90 10/25/2019   PLT 261 10/25/2019   Lab Results  Component Value Date   NA 138 10/25/2019   K 4.4 10/25/2019   CO2 21 10/25/2019   GLUCOSE 102 (H) 10/25/2019   BUN 17 10/25/2019   CREATININE 1.08 10/25/2019   BILITOT 0.7 05/12/2019   ALKPHOS 85 01/29/2017   AST 37 (H) 05/12/2019   ALT 37 05/12/2019   PROT 7.4 05/12/2019   ALBUMIN 4.4 01/29/2017   CALCIUM 9.8 10/25/2019   ANIONGAP 9 01/25/2019   GFR 96.29 12/22/2013   Lab Results  Component Value Date   CHOL 182 05/12/2019   Lab Results  Component Value Date   HDL 28 (L) 05/12/2019   Lab Results  Component Value Date   LDLCALC 116 (H) 05/12/2019   Lab Results  Component Value Date   TRIG 267 (H) 05/12/2019   Lab Results  Component Value Date   CHOLHDL 6.5 (H) 05/12/2019   No results found for: HGBA1C     Assessment & Plan:   Problem List Items Addressed This Visit    None    Visit Diagnoses    Bacterial conjunctivitis of right eye    -  Primary  Relevant Medications   erythromycin ophthalmic ointment       Meds ordered this encounter  Medications  . erythromycin ophthalmic ointment    Sig: Place 1 application into the right eye at bedtime.    Dispense:  3.5 g    Refill:  0    Order Specific Question:   Supervising Provider    Answer:   Carlota Raspberry, JEFFREY R [[id number]]   PLAN  Bacterial conjunctivitis - treat with erythromycin ophthalmic ointment. 1 application before bed. Daily until resolved  Continue flecainide. Contact cardiology if worsening or failing to improve. Pt well aware of afib and treatment - aware of emergency symptoms  Patient  encouraged to call clinic with any questions, comments, or concerns.  Maximiano Coss, NP

## 2020-05-12 ENCOUNTER — Other Ambulatory Visit: Payer: Self-pay

## 2020-05-12 ENCOUNTER — Ambulatory Visit (INDEPENDENT_AMBULATORY_CARE_PROVIDER_SITE_OTHER): Payer: 59

## 2020-05-12 ENCOUNTER — Ambulatory Visit: Payer: 59 | Admitting: Registered Nurse

## 2020-05-12 ENCOUNTER — Encounter: Payer: Self-pay | Admitting: Registered Nurse

## 2020-05-12 VITALS — BP 128/84 | HR 63 | Temp 98.1°F | Resp 18 | Ht 73.0 in | Wt 259.2 lb

## 2020-05-12 DIAGNOSIS — M2480 Other specific joint derangements of unspecified joint, not elsewhere classified: Secondary | ICD-10-CM

## 2020-05-12 DIAGNOSIS — M62838 Other muscle spasm: Secondary | ICD-10-CM | POA: Diagnosis not present

## 2020-05-12 DIAGNOSIS — M25511 Pain in right shoulder: Secondary | ICD-10-CM | POA: Diagnosis not present

## 2020-05-12 DIAGNOSIS — S99911A Unspecified injury of right ankle, initial encounter: Secondary | ICD-10-CM

## 2020-05-12 MED ORDER — CYCLOBENZAPRINE HCL 5 MG PO TABS: 5.0000 mg | ORAL_TABLET | Freq: Three times a day (TID) | ORAL | 1 refills | Status: DC | PRN

## 2020-05-12 MED ORDER — CELECOXIB 100 MG PO CAPS: 100.0000 mg | ORAL_CAPSULE | Freq: Two times a day (BID) | ORAL | 0 refills | Status: DC

## 2020-05-12 NOTE — Patient Instructions (Signed)
° ° ° °  If you have lab work done today you will be contacted with your lab results within the next 2 weeks.  If you have not heard from us then please contact us. The fastest way to get your results is to register for My Chart. ° ° °IF you received an x-ray today, you will receive an invoice from [facility]. Please contact [facility] at [phone] with questions or concerns regarding your invoice.  ° °IF you received labwork today, you will receive an invoice from [facility]. Please contact [facility] at [phone] with questions or concerns regarding your invoice.  ° °Our billing staff will not be able to assist you with questions regarding bills from these companies. ° °You will be contacted with the lab results as soon as they are available. The fastest way to get your results is to activate your My Chart account. Instructions are located on the last page of this paperwork. If you have not heard from us regarding the results in 2 weeks, please contact this office. °  ° ° ° °

## 2020-05-14 ENCOUNTER — Encounter: Payer: Self-pay | Admitting: Registered Nurse

## 2020-05-14 NOTE — Progress Notes (Signed)
Acute Office Visit  Subjective:    Patient ID: Ethan Lambert, male    DOB: Oct 11, 1960, 59 y.o.   MRN: [id number]  Chief Complaint  Patient presents with  . Shoulder Pain    Patient states he has been having right shoulder pain for about 4-6 weeks. Patient states he has used some ice packs , hot tubs , medications and all. Makes it hard to work and use the right hand and getting worse by the day.    HPI Patient is in today for acute shoulder pain - r side. Radiates towards neck and down arm Greatly limits ROM Limits strength throughout RUE No acute injury that he knows of  Also notes "crunching" in neck with ROM - chart review reveals that he has had some spondolysis in the past.  No new headaches, no saddle symptoms. L side is spared.  Past Medical History:  Diagnosis Date  . Anemia   . Arthritis    "probably right ring finger joint" (05/28/2018)  . Childhood asthma    as child none now  . Clotting disorder ([place])   . CVA (cerebral vascular accident) ([place]) 1999 X 2   LEFT FRONTAL & OCCIPITAL , NON-HEMORRHAGIC; no residual  . Family history of malignant neoplasm of gastrointestinal tract   . Heart murmur   . History of blood transfusion 1999   "related to the stroke"  . History of kidney stones   . HIV disease ([place]) dx 1999   MONITORED BY INFECTIOUS DISEASE -- DR [place]  . Hypertension   . Paroxysmal atrial fibrillation (HCC)    CARDIOLOGIST--  DR Rayann Heman  . Right knee meniscal tear   . Seizures ([place])    1999  . Skin cancer    skin  . Sleep apnea    "didn't follow thru w/mask" (05/28/2018)    Past Surgical History:  Procedure Laterality Date  . APPENDECTOMY  06-09-2003  . ATRIAL FIBRILLATION ABLATION  05/28/2018  . ATRIAL FIBRILLATION ABLATION N/A 05/28/2018   Procedure: ATRIAL FIBRILLATION ABLATION;  Surgeon: Thompson Grayer, MD;  Location: [facility];  Service: Cardiovascular;  Laterality: N/A;  . BACK SURGERY    . INGUINAL HERNIA REPAIR Bilateral  1990s  . KNEE ARTHROSCOPY WITH MEDIAL MENISECTOMY Right 12/31/2013   Procedure: RIGHT KNEE ARTHROSCOPY PARTIAL MEDIAL MENISCECTOMY DEBRIDEMENT AND CHONDROPLASTY ;  Surgeon: Sydnee Cabal, MD;  Location: [facility];  Service: Orthopedics;  Laterality: Right;  . KNEE ARTHROSCOPY WITH MEDIAL MENISECTOMY Left 01/27/2019   Procedure: LEFT KNEE ARTHROSCOPY WITH PARTIAL MEDIAL MENISCECTOMY;  Surgeon: Leandrew Koyanagi, MD;  Location: [facility];  Service: Orthopedics;  Laterality: Left;  . LAPAROSCOPIC CHOLECYSTECTOMY  07-08-2003  . LASER ABLATION ANAL CONDYLOMA  05-18-2001  . LUMBAR LAMINECTOMY/DECOMPRESSION MICRODISCECTOMY Left 03/15/2015   Procedure: Left Lumbar five- sacral one microdiskectomy;  Surgeon: Consuella Lose, MD;  Location: [facility];  Service: Neurosurgery;  Laterality: Left;  Left L5S1 microdiskectomy  . NEGATIVE SLEEP STUDY  01-03-2012  in epic  . SKIN CANCER EXCISION Right    "cut it off my shoulder"  . TRANSTHORACIC ECHOCARDIOGRAM  12-01-2008   MODERATE LVH/  EF 55-60%/  MILD MR/  NEGATIVE BUBBLE STUDY    Family History  Problem Relation Age of Onset  . Arrhythmia Father   . Melanoma Father   . Heart attack Brother   . Prostate cancer Maternal Grandfather   . Colon cancer Maternal Grandfather   . Lung cancer Mother  smoker  . Hypertension Mother   . Hyperlipidemia Mother   . Other Maternal Grandmother        harding of the arteries  . Rectal cancer Neg Hx   . Stomach cancer Neg Hx     Social History   Socioeconomic History  . Marital status: Married    Spouse name: Not on file  . Number of children: 1  . Years of education: Not on file  . Highest education level: Not on file  Occupational History  . 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 Smoking status: Never Smoker  . Smokeless tobacco: Never Used  Vaping Use  . Vaping Use: Never used  Substance and Sexual Activity  . Alcohol  use: Yes    Alcohol/week: 0.0 standard drinks    Comment: 05/28/2018 "rarely; 1-2 per month"  . Drug use: Never  . Sexual activity: Yes    Partners: Male  Other Topics Concern  . Not on file  Social History Narrative   Pt lives in [place]. 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 Difficulty of Paying Living Expenses: Not on file  Food Insecurity:   . Worried About Charity fundraiser in the Last Year: Not on file  . Ran Out of Food in the Last Year: Not on file  Transportation Needs:   . Lack of Transportation (Medical): Not on file  . Lack of Transportation (Non-Medical): Not on file  Physical Activity:   . Days of Exercise per Week: Not on file  . Minutes of Exercise per Session: Not on file  Stress:   . Feeling of Stress : Not on file  Social Connections:   . Frequency of Communication with Friends and Family: Not on file  . Frequency of Social Gatherings with Friends and Family: Not on file  . Attends Religious Services: Not on file  . Active Member of Clubs or Organizations: Not on file  . Attends Archivist Meetings: Not on file  . Marital Status: Not on file  Intimate Partner Violence:   . Fear of Current or Ex-Partner: Not on file  . Emotionally Abused: Not on file  . Physically Abused: Not on file  . Sexually Abused: Not on file    Outpatient Medications Prior to Visit  Medication Sig Dispense Refill  . diltiazem (CARDIZEM CD) 240 MG 24 hr capsule TAKE 1 CAPSULE BY MOUTH EVERY DAY 90 capsule 1  . ELIQUIS 5 MG TABS tablet TAKE 1 TABLET BY MOUTH  TWICE DAILY 180 tablet 1  . flecainide (TAMBOCOR) 150 MG tablet Takes 2 tablets - prn for his A-fib episodes     . TRIUMEQ 600-50-300 MG tablet TAKE 1 TABLET BY MOUTH EVERY DAY 30 tablet 0  . albuterol (VENTOLIN HFA) 108 (90 Base) MCG/ACT inhaler Inhale 1-2 puffs into the lungs every 6 (six) hours as needed for wheezing or shortness of breath. 8 g 0   . erythromycin ophthalmic ointment Place 1 application into the right eye at bedtime. 3.5 g 0  . celecoxib (CELEBREX) 100 MG capsule Take 1 capsule (100 mg total) by mouth 2 (two) times daily. 30 capsule 0   No facility-administered medications prior to visit.    Allergies  Allergen Reactions  . Morphine Other (See Comments)    hallucinations    Review of Systems Per hpi      Objective:  Physical Exam Vitals and nursing note reviewed.  Constitutional:      Appearance: Normal appearance.  Cardiovascular:     Rate and Rhythm: Normal rate and regular rhythm.  Pulmonary:     Effort: Pulmonary effort is normal. No respiratory distress.  Musculoskeletal:        General: Tenderness (joint line R shoulder, R trapezius) present. No swelling.     Comments: ROM in R shoulder limited in all aspects d/t pain   Skin:    General: Skin is warm and dry.     Capillary Refill: Capillary refill takes less than 2 seconds.  Neurological:     General: No focal deficit present.     Mental Status: He is alert and oriented to person, place, and time. Mental status is at baseline.  Psychiatric:        Mood and Affect: Mood normal.        Behavior: Behavior normal.        Thought Content: Thought content normal.        Judgment: Judgment normal.     BP 128/84   Pulse 63   Temp 98.1 F (36.7 C) (Temporal)   Resp 18   Ht 6\' 1"  (1.854 m)   Wt 259 lb 3.2 oz (117.6 kg)   SpO2 96%   BMI 34.20 kg/m  Wt Readings from Last 3 Encounters:  05/12/20 259 lb 3.2 oz (117.6 kg)  04/12/20 257 lb 9.6 oz (116.8 kg)  02/21/20 260 lb 12.8 oz (118.3 kg)    There are no preventive care reminders to display for this patient.  There are no preventive care reminders to display for this patient.    Lab Results  Component Value Date   WBC 7.6 10/25/2019   HGB 15.5 10/25/2019   HCT 44.5 10/25/2019   MCV 90 10/25/2019   PLT 261 10/25/2019   Lab Results  Component Value Date   NA 138 10/25/2019    K 4.4 10/25/2019   CO2 21 10/25/2019   GLUCOSE 102 (H) 10/25/2019   BUN 17 10/25/2019   CREATININE 1.08 10/25/2019   BILITOT 0.7 05/12/2019   ALKPHOS 85 01/29/2017   AST 37 (H) 05/12/2019   ALT 37 05/12/2019   PROT 7.4 05/12/2019   ALBUMIN 4.4 01/29/2017   CALCIUM 9.8 10/25/2019   ANIONGAP 9 01/25/2019   GFR 96.29 12/22/2013   Lab Results  Component Value Date   CHOL 182 05/12/2019   Lab Results  Component Value Date   HDL 28 (L) 05/12/2019   Lab Results  Component Value Date   LDLCALC 116 (H) 05/12/2019   Lab Results  Component Value Date   TRIG 267 (H) 05/12/2019   Lab Results  Component Value Date   CHOLHDL 6.5 (H) 05/12/2019   No results found for: HGBA1C     Assessment & Plan:   Problem List Items Addressed This Visit      Other   Shoulder pain, right   Relevant Medications   cyclobenzaprine (FLEXERIL) 5 MG tablet   Other Relevant Orders   DG Shoulder Right (Completed)    Other Visit Diagnoses    Cervical spine crepitus    -  Primary   Relevant Orders   DG Cervical Spine Complete (Completed)   Trapezius muscle spasm       Relevant Medications   cyclobenzaprine (FLEXERIL) 5 MG tablet   Injury of right ankle, initial encounter       Relevant Medications   celecoxib (CELEBREX)  100 MG capsule       Meds ordered this encounter  Medications  . cyclobenzaprine (FLEXERIL) 5 MG tablet    Sig: Take 1 tablet (5 mg total) by mouth 3 (three) times daily as needed for muscle spasms.    Dispense:  30 tablet    Refill:  1    Order Specific Question:   Supervising Provider    Answer:   Carlota Raspberry, JEFFREY R [[id number]]  . celecoxib (CELEBREX) 100 MG capsule    Sig: Take 1 capsule (100 mg total) by mouth 2 (two) times daily.    Dispense:  30 capsule    Refill:  0    Order Specific Question:   Supervising Provider    Answer:   Carlota Raspberry, JEFFREY R [[id number]]   PLAN  Xray shows progression in degenerative changes and perhaps muscle spasm. No doubt contributing to  symptoms. Xray shoulder shows no acute abnormalities.  Will give celebrex and flexeril. RICE through weekend. If worsening we can consider ortho, but I anticipate improvement  Patient encouraged to call clinic with any questions, comments, or concerns.   Ethan Coss, NP

## 2020-05-15 ENCOUNTER — Encounter: Payer: Self-pay | Admitting: Registered Nurse

## 2020-05-15 DIAGNOSIS — M25511 Pain in right shoulder: Secondary | ICD-10-CM

## 2020-05-15 NOTE — Telephone Encounter (Signed)
Pt requesting work note for pain how long should I write this for if I should?

## 2020-05-15 NOTE — Telephone Encounter (Signed)
Sent the note, sent referral to shoulder specialist  Thank you  Kathrin Ruddy, NP

## 2020-05-22 ENCOUNTER — Ambulatory Visit (INDEPENDENT_AMBULATORY_CARE_PROVIDER_SITE_OTHER): Payer: 59 | Admitting: Registered Nurse

## 2020-05-22 ENCOUNTER — Encounter: Payer: Self-pay | Admitting: Registered Nurse

## 2020-05-22 ENCOUNTER — Other Ambulatory Visit: Payer: Self-pay

## 2020-05-22 VITALS — BP 162/82 | HR 69 | Temp 98.0°F | Resp 18 | Ht 73.0 in | Wt 266.4 lb

## 2020-05-22 DIAGNOSIS — M25511 Pain in right shoulder: Secondary | ICD-10-CM

## 2020-05-22 MED ORDER — BACLOFEN 10 MG PO TABS: 10.0000 mg | ORAL_TABLET | Freq: Three times a day (TID) | ORAL | 0 refills | Status: DC

## 2020-05-22 MED ORDER — PREDNISONE 10 MG PO TABS: ORAL_TABLET | ORAL | 0 refills | Status: AC

## 2020-05-22 NOTE — Patient Instructions (Signed)
° ° ° °  If you have lab work done today you will be contacted with your lab results within the next 2 weeks.  If you have not heard from us then please contact us. The fastest way to get your results is to register for My Chart. ° ° °IF you received an x-ray today, you will receive an invoice from [facility]. Please contact [facility] at [phone] with questions or concerns regarding your invoice.  ° °IF you received labwork today, you will receive an invoice from [facility]. Please contact [facility] at [phone] with questions or concerns regarding your invoice.  ° °Our billing staff will not be able to assist you with questions regarding bills from these companies. ° °You will be contacted with the lab results as soon as they are available. The fastest way to get your results is to activate your My Chart account. Instructions are located on the last page of this paperwork. If you have not heard from us regarding the results in 2 weeks, please contact this office. °  ° ° ° °

## 2020-05-22 NOTE — Progress Notes (Signed)
Acute Office Visit  Subjective:    Patient ID: Ethan Lambert, male    DOB: [birth year], 59 y.o.   MRN: [id number]  Chief Complaint  Patient presents with  . Follow-up    OPatient states that he is here for his right shoulder that he was just seen for 1 week ago that has gotten worse. Per patient the medications prescribed didn't seem to help.     HPI Patient is in today for ongoing R shoulder pain celebrex and flexeril unfortunately not helpful Pain is worse in shoulder and running down arm Neck is the same Pain is anterior and superior in shoulder Has lost some ROM in flexion and abduction No bruising or swelling Does have tenderness extending into R trapezius No numbness, weakness, tingling in R hand R elbow is spared  Past Medical History:  Diagnosis Date  . Anemia   . Arthritis    "probably right ring finger joint" (05/28/2018)  . Childhood asthma    as child none now  . Clotting disorder ([place])   . CVA (cerebral vascular accident) ([place]) 1999 X 2   LEFT FRONTAL & OCCIPITAL , NON-HEMORRHAGIC; no residual  . Family history of malignant neoplasm of gastrointestinal tract   . Heart murmur   . History of blood transfusion 1999   "related to the stroke"  . History of kidney stones   . HIV disease ([place]) dx 1999   MONITORED BY INFECTIOUS DISEASE -- DR [place]  . Hypertension   . Paroxysmal atrial fibrillation (HCC)    CARDIOLOGIST--  DR Rayann Heman  . Right knee meniscal tear   . Seizures ([place])    1999  . Skin cancer    skin  . Sleep apnea    "didn't follow thru w/mask" (05/28/2018)    Past Surgical History:  Procedure Laterality Date  . APPENDECTOMY  06-09-2003  . ATRIAL FIBRILLATION ABLATION  05/28/2018  . ATRIAL FIBRILLATION ABLATION N/A 05/28/2018   Procedure: ATRIAL FIBRILLATION ABLATION;  Surgeon: Thompson Grayer, MD;  Location: [facility];  Service: Cardiovascular;  Laterality: N/A;  . BACK SURGERY    . INGUINAL HERNIA REPAIR Bilateral 1990s  .  KNEE ARTHROSCOPY WITH MEDIAL MENISECTOMY Right 12/31/2013   Procedure: RIGHT KNEE ARTHROSCOPY PARTIAL MEDIAL MENISCECTOMY DEBRIDEMENT AND CHONDROPLASTY ;  Surgeon: Sydnee Cabal, MD;  Location: [facility];  Service: Orthopedics;  Laterality: Right;  . KNEE ARTHROSCOPY WITH MEDIAL MENISECTOMY Left 01/27/2019   Procedure: LEFT KNEE ARTHROSCOPY WITH PARTIAL MEDIAL MENISCECTOMY;  Surgeon: Leandrew Koyanagi, MD;  Location: [facility];  Service: Orthopedics;  Laterality: Left;  . LAPAROSCOPIC CHOLECYSTECTOMY  07-08-2003  . LASER ABLATION ANAL CONDYLOMA  05-18-2001  . LUMBAR LAMINECTOMY/DECOMPRESSION MICRODISCECTOMY Left 03/15/2015   Procedure: Left Lumbar five- sacral one microdiskectomy;  Surgeon: Consuella Lose, MD;  Location: [facility];  Service: Neurosurgery;  Laterality: Left;  Left L5S1 microdiskectomy  . NEGATIVE SLEEP STUDY  01-03-2012  in epic  . SKIN CANCER EXCISION Right    "cut it off my shoulder"  . TRANSTHORACIC ECHOCARDIOGRAM  12-01-2008   MODERATE LVH/  EF 55-60%/  MILD MR/  NEGATIVE BUBBLE STUDY    Family History  Problem Relation Age of Onset  . Arrhythmia Father   . Melanoma Father   . Heart attack Brother   . Prostate cancer Maternal Grandfather   . Colon cancer Maternal Grandfather   . Lung cancer Mother        smoker  . Hypertension Mother   .  Hyperlipidemia Mother   . Other Maternal Grandmother        harding of the arteries  . Rectal cancer Neg Hx   . Stomach cancer Neg Hx     Social History   Socioeconomic History  . Marital status: Married    Spouse name: Not on file  . Number of children: 1  . Years of education: Not on file  . Highest education level: Not on file  Occupational History  . 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 Smoking status: Never Smoker  . Smokeless tobacco: Never Used  Vaping Use  . Vaping Use: Never used  Substance and Sexual Activity  . Alcohol use: Yes     Alcohol/week: 0.0 standard drinks    Comment: 05/28/2018 "rarely; 1-2 per month"  . Drug use: Never  . Sexual activity: Yes    Partners: Male  Other Topics Concern  . Not on file  Social History Narrative   Pt lives in [place]. 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 Difficulty of Paying Living Expenses: Not on file  Food Insecurity:   . Worried About Charity fundraiser in the Last Year: Not on file  . Ran Out of Food in the Last Year: Not on file  Transportation Needs:   . Lack of Transportation (Medical): Not on file  . Lack of Transportation (Non-Medical): Not on file  Physical Activity:   . Days of Exercise per Week: Not on file  . Minutes of Exercise per Session: Not on file  Stress:   . Feeling of Stress : Not on file  Social Connections:   . Frequency of Communication with Friends and Family: Not on file  . Frequency of Social Gatherings with Friends and Family: Not on file  . Attends Religious Services: Not on file  . Active Member of Clubs or Organizations: Not on file  . Attends Archivist Meetings: Not on file  . Marital Status: Not on file  Intimate Partner Violence:   . Fear of Current or Ex-Partner: Not on file  . Emotionally Abused: Not on file  . Physically Abused: Not on file  . Sexually Abused: Not on file    Outpatient Medications Prior to Visit  Medication Sig Dispense Refill  . celecoxib (CELEBREX) 100 MG capsule Take 1 capsule (100 mg total) by mouth 2 (two) times daily. 30 capsule 0  . cyclobenzaprine (FLEXERIL) 5 MG tablet Take 1 tablet (5 mg total) by mouth 3 (three) times daily as needed for muscle spasms. 30 tablet 1  . diltiazem (CARDIZEM CD) 240 MG 24 hr capsule TAKE 1 CAPSULE BY MOUTH EVERY DAY 90 capsule 1  . ELIQUIS 5 MG TABS tablet TAKE 1 TABLET BY MOUTH  TWICE DAILY 180 tablet 1  . flecainide (TAMBOCOR) 150 MG tablet Takes 2 tablets - prn for his A-fib episodes      . TRIUMEQ 600-50-300 MG tablet TAKE 1 TABLET BY MOUTH EVERY DAY 30 tablet 0  . albuterol (VENTOLIN HFA) 108 (90 Base) MCG/ACT inhaler Inhale 1-2 puffs into the lungs every 6 (six) hours as needed for wheezing or shortness of breath. 8 g 0  . erythromycin ophthalmic ointment Place 1 application into the right eye at bedtime. 3.5 g 0   No facility-administered medications prior to visit.    Allergies  Allergen Reactions  . Morphine Other (See Comments)  hallucinations    Review of Systems Per hpi      Objective:    Physical Exam Vitals and nursing note reviewed.  Constitutional:      Appearance: Normal appearance. He is obese.  Cardiovascular:     Rate and Rhythm: Normal rate and regular rhythm.  Pulmonary:     Effort: Pulmonary effort is normal. No respiratory distress.  Musculoskeletal:        General: Tenderness (joint line R shoulder and upper R arm) present. No swelling, deformity or signs of injury.     Right lower leg: No edema.     Left lower leg: No edema.     Comments: Limited ROM and strength in flexion and abduction of R shoulder. Pain interferes with any meaningful testing.  Skin:    Capillary Refill: Capillary refill takes less than 2 seconds.  Neurological:     General: No focal deficit present.     Mental Status: He is alert and oriented to person, place, and time. Mental status is at baseline.  Psychiatric:        Mood and Affect: Mood normal.        Behavior: Behavior normal.        Thought Content: Thought content normal.        Judgment: Judgment normal.     BP (!) 162/82   Pulse 69   Temp 98 F (36.7 C) (Temporal)   Resp 18   Ht 6\' 1"  (1.854 m)   Wt 266 lb 6.4 oz (120.8 kg)   SpO2 95%   BMI 35.15 kg/m  Wt Readings from Last 3 Encounters:  05/22/20 266 lb 6.4 oz (120.8 kg)  05/12/20 259 lb 3.2 oz (117.6 kg)  04/12/20 257 lb 9.6 oz (116.8 kg)    There are no preventive care reminders to display for this patient.  There are no  preventive care reminders to display for this patient.    Lab Results  Component Value Date   WBC 7.6 10/25/2019   HGB 15.5 10/25/2019   HCT 44.5 10/25/2019   MCV 90 10/25/2019   PLT 261 10/25/2019   Lab Results  Component Value Date   NA 138 10/25/2019   K 4.4 10/25/2019   CO2 21 10/25/2019   GLUCOSE 102 (H) 10/25/2019   BUN 17 10/25/2019   CREATININE 1.08 10/25/2019   BILITOT 0.7 05/12/2019   ALKPHOS 85 01/29/2017   AST 37 (H) 05/12/2019   ALT 37 05/12/2019   PROT 7.4 05/12/2019   ALBUMIN 4.4 01/29/2017   CALCIUM 9.8 10/25/2019   ANIONGAP 9 01/25/2019   GFR 96.29 12/22/2013   Lab Results  Component Value Date   CHOL 182 05/12/2019   Lab Results  Component Value Date   HDL 28 (L) 05/12/2019   Lab Results  Component Value Date   LDLCALC 116 (H) 05/12/2019   Lab Results  Component Value Date   TRIG 267 (H) 05/12/2019   Lab Results  Component Value Date   CHOLHDL 6.5 (H) 05/12/2019   No results found for: HGBA1C     Assessment & Plan:   Problem List Items Addressed This Visit      Other   Shoulder pain, right - Primary   Relevant Medications   baclofen (LIORESAL) 10 MG tablet   predniSONE (DELTASONE) 10 MG tablet   Other Relevant Orders   Ambulatory referral to Orthopedic Surgery       Meds ordered this encounter  Medications  . baclofen (LIORESAL)  10 MG tablet    Sig: Take 1 tablet (10 mg total) by mouth 3 (three) times daily.    Dispense:  30 each    Refill:  0    Order Specific Question:   Supervising Provider    Answer:   Carlota Raspberry, JEFFREY R [[id number]]  . predniSONE (DELTASONE) 10 MG tablet    Sig: Take 4 tablets (40 mg total) by mouth daily with breakfast for 3 days, THEN 2 tablets (20 mg total) daily with breakfast for 3 days, THEN 1 tablet (10 mg total) daily with breakfast for 3 days.    Dispense:  21 tablet    Refill:  0    Order Specific Question:   Supervising Provider    Answer:   Carlota Raspberry, JEFFREY R [[id number]]   PLAN  Patient cannot  tolerate opioids  Will try prednisone taper and baclofen  Has appt with ortho next week - will resend referral to see if there's anything sooner  Work note written through 05/31/20  Patient encouraged to call clinic with any questions, comments, or concerns.  Maximiano Coss, NP

## 2020-05-26 ENCOUNTER — Other Ambulatory Visit: Payer: Self-pay | Admitting: Registered Nurse

## 2020-05-26 ENCOUNTER — Telehealth: Payer: Self-pay | Admitting: Registered Nurse

## 2020-05-26 NOTE — Telephone Encounter (Signed)
Called patient and lvm to inform him that the [facility] paperwork was faxed.

## 2020-05-26 NOTE — Telephone Encounter (Signed)
Received a fax from [facility] for pt. Paperwork is [employer] account planner in providers box beside nurses station.

## 2020-05-26 NOTE — Telephone Encounter (Signed)
We have received this paperwork from the box and will starting working on it today.

## 2020-05-27 ENCOUNTER — Other Ambulatory Visit: Payer: Self-pay | Admitting: Internal Medicine

## 2020-05-27 DIAGNOSIS — B20 Human immunodeficiency virus [HIV] disease: Secondary | ICD-10-CM

## 2020-05-31 NOTE — Telephone Encounter (Signed)
05/31/2020 - Ethan Lambert CALLED TO SAY THAT HE RECEIVED A E-MAIL THIS MORNING FROM [employer] REGARDING THE FMLA FORMS THAT Ethan Lambert FILLED OUT FOR [place]. TO SAY Ethan Lambert DID NOT SIGN AND PUT HIS TITLE ON THE FORMS. HE SAID WE KEPT A COPY FOR OUR RECORDS SO HE WOULD LIKE THEM TO BE SIGNED AND REFAXED. PLEASE CALL PATIENT WHEN IT HAS BEEN DONE SO HE WILL KNOW. BEST PHONE: [phone] (CELL) [place]

## 2020-06-05 NOTE — Telephone Encounter (Signed)
Pt's  job got in touch with him and advised him------We didn't provide [place] or SPECIALIZATION. The health care provider also needs to sign the certification-this is exactly what they have told pt that needs to be done. If any questions please call pt at [phone].

## 2020-06-06 NOTE — Telephone Encounter (Signed)
If you have a copy of this patient FMLA form can you fill out this part and fax back to patient job asap. Let me know if there is anything you need me to do to help this process.

## 2020-06-07 NOTE — Telephone Encounter (Signed)
Paperwork is signed and being faxed back now.

## 2020-06-10 ENCOUNTER — Other Ambulatory Visit: Payer: Self-pay | Admitting: Internal Medicine

## 2020-06-29 ENCOUNTER — Other Ambulatory Visit: Payer: Self-pay | Admitting: Internal Medicine

## 2020-06-30 NOTE — Telephone Encounter (Addendum)
Pt last saw Barrington Ellison, [place] on 10/25/19, last labs 10/25/19 Creat 1.08, age 60, weight 120.8kg, based on specified criteria pt is on appropriate dosage of Eliquis 5mg  BID.  Will refill rx.

## 2020-07-06 ENCOUNTER — Encounter: Payer: Self-pay | Admitting: Registered Nurse

## 2020-07-06 NOTE — Progress Notes (Signed)
Acute Office Visit  Subjective:    Patient ID: Ethan Lambert, male    DOB: 01/14/1961, 60 y.o.   MRN: [id number]  Chief Complaint  Patient presents with  . Ankle Pain    Patient states 2-3 weeks ago that he heard a pop in his foot. Per patient his right ankle and and foot is in severe pain . He states he has been taking pain medication and lifting his leg up and putting ice on. Per patient he has been working mandatory hours and constatnt walking.    HPI Patient is in today for acute pain  R ankle Acute injury at work Felt "pop" Severe pain, ongoing. Happened 2-3 weeks ago, expected it would be resolved Stairs are extremely difficult Been icing, elevating, OTCs Some relief Work is tough - he has to go up and down stairs, lift objects/people  Past Medical History:  Diagnosis Date  . Anemia   . Arthritis    "probably right ring finger joint" (05/28/2018)  . Childhood asthma    as child none now  . Clotting disorder ([place])   . CVA (cerebral vascular accident) ([place]) 1999 X 2   LEFT FRONTAL & OCCIPITAL , NON-HEMORRHAGIC; no residual  . Family history of malignant neoplasm of gastrointestinal tract   . Heart murmur   . History of blood transfusion 1999   "related to the stroke"  . History of kidney stones   . HIV disease ([place]) dx 1999   MONITORED BY INFECTIOUS DISEASE -- DR [place]  . Hypertension   . Paroxysmal atrial fibrillation (HCC)    CARDIOLOGIST--  DR Rayann Heman  . Right knee meniscal tear   . Seizures ([place])    1999  . Skin cancer    skin  . Sleep apnea    "didn't follow thru w/mask" (05/28/2018)    Past Surgical History:  Procedure Laterality Date  . APPENDECTOMY  06-09-2003  . ATRIAL FIBRILLATION ABLATION  05/28/2018  . ATRIAL FIBRILLATION ABLATION N/A 05/28/2018   Procedure: ATRIAL FIBRILLATION ABLATION;  Surgeon: Thompson Grayer, MD;  Location: [facility];  Service: Cardiovascular;  Laterality: N/A;  . BACK SURGERY    . INGUINAL HERNIA REPAIR  Bilateral 1990s  . KNEE ARTHROSCOPY WITH MEDIAL MENISECTOMY Right 12/31/2013   Procedure: RIGHT KNEE ARTHROSCOPY PARTIAL MEDIAL MENISCECTOMY DEBRIDEMENT AND CHONDROPLASTY ;  Surgeon: Sydnee Cabal, MD;  Location: [facility];  Service: Orthopedics;  Laterality: Right;  . KNEE ARTHROSCOPY WITH MEDIAL MENISECTOMY Left 01/27/2019   Procedure: LEFT KNEE ARTHROSCOPY WITH PARTIAL MEDIAL MENISCECTOMY;  Surgeon: Leandrew Koyanagi, MD;  Location: [facility];  Service: Orthopedics;  Laterality: Left;  . LAPAROSCOPIC CHOLECYSTECTOMY  07-08-2003  . LASER ABLATION ANAL CONDYLOMA  05-18-2001  . LUMBAR LAMINECTOMY/DECOMPRESSION MICRODISCECTOMY Left 03/15/2015   Procedure: Left Lumbar five- sacral one microdiskectomy;  Surgeon: Consuella Lose, MD;  Location: [facility];  Service: Neurosurgery;  Laterality: Left;  Left L5S1 microdiskectomy  . NEGATIVE SLEEP STUDY  01-03-2012  in epic  . SKIN CANCER EXCISION Right    "cut it off my shoulder"  . TRANSTHORACIC ECHOCARDIOGRAM  12-01-2008   MODERATE LVH/  EF 55-60%/  MILD MR/  NEGATIVE BUBBLE STUDY    Family History  Problem Relation Age of Onset  . Arrhythmia Father   . Melanoma Father   . Heart attack Brother   . Prostate cancer Maternal Grandfather   . Colon cancer Maternal Grandfather   . Lung cancer Mother  smoker  . Hypertension Mother   . Hyperlipidemia Mother   . Other Maternal Grandmother        harding of the arteries  . Rectal cancer Neg Hx   . Stomach cancer Neg Hx     Social History   Socioeconomic History  . Marital status: Married    Spouse name: Not on file  . Number of children: 1  . Years of education: Not on file  . Highest education level: Not on file  Occupational History  . 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 Smoking status: Never Smoker  . Smokeless tobacco: Never Used  Vaping Use  . Vaping Use: Never used  Substance and Sexual Activity  .  Alcohol use: Yes    Alcohol/week: 0.0 standard drinks    Comment: 05/28/2018 "rarely; 1-2 per month"  . Drug use: Never  . Sexual activity: Yes    Partners: Male  Other Topics Concern  . Not on file  Social History Narrative   Pt lives in [place]. 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 albuterol (VENTOLIN HFA) 108 (90 Base) MCG/ACT inhaler Inhale 1-2 puffs into the lungs every 6 (six) hours as needed for wheezing or shortness of breath. 8 g 0  . erythromycin ophthalmic ointment Place 1 application into the right eye at bedtime. 3.5 g 0  . flecainide (TAMBOCOR) 150 MG tablet Takes 2 tablets - prn for his A-fib episodes     . diltiazem (CARDIZEM CD) 240 MG 24 hr capsule Take 1 capsule (240 mg total) by mouth daily. 90 capsule 2  . ELIQUIS 5 MG TABS tablet TAKE 1 TABLET BY MOUTH  TWICE DAILY 180 tablet 1  . TRIUMEQ 600-50-300 MG tablet TAKE 1 TABLET BY MOUTH EVERY DAY 30 tablet 0   No facility-administered medications prior to visit.    Allergies  Allergen Reactions  . Morphine Other (See Comments)    hallucinations    Review of Systems Per hpi      Objective:    Physical Exam Vitals and nursing note reviewed.  Constitutional:      Appearance: Normal appearance.  Cardiovascular:     Rate and Rhythm: Normal rate and regular rhythm.     Pulses: Normal pulses.     Heart sounds: Normal heart sounds. No murmur heard. No friction rub. No gallop.   Pulmonary:     Effort: Pulmonary effort is normal. No respiratory distress.     Breath sounds: Normal breath sounds. No stridor. No wheezing, rhonchi or rales.  Musculoskeletal:         General: Swelling, tenderness and signs of injury present. No deformity. Normal range of motion.     Right lower leg: No edema.     Left lower leg: No edema.     Comments: r ankle  Neurological:     General: No focal deficit present.     Mental Status: He is alert. Mental status is at baseline.  Psychiatric:        Mood and Affect: Mood normal.        Behavior: Behavior  normal.        Thought Content: Thought content normal.        Judgment: Judgment normal.     BP 136/85   Pulse 60   Temp 98.3 F (36.8 C) (Temporal)   Resp 18   Ht 6\' 1"  (1.854 m)   Wt 257 lb 9.6 oz (116.8 kg)   SpO2 95%   BMI 33.99 kg/m  Wt Readings from Last 3 Encounters:  05/22/20 266 lb 6.4 oz (120.8 kg)  05/12/20 259 lb 3.2 oz (117.6 kg)  04/12/20 257 lb 9.6 oz (116.8 kg)    Health Maintenance Due  Topic Date Due  . COVID-19 Vaccine (1) Never done    There are no preventive care reminders to display for this patient.    Lab Results  Component Value Date   WBC 7.6 10/25/2019   HGB 15.5 10/25/2019   HCT 44.5 10/25/2019   MCV 90 10/25/2019   PLT 261 10/25/2019   Lab Results  Component Value Date   NA 138 10/25/2019   K 4.4 10/25/2019   CO2 21 10/25/2019   GLUCOSE 102 (H) 10/25/2019   BUN 17 10/25/2019   CREATININE 1.08 10/25/2019   BILITOT 0.7 05/12/2019   ALKPHOS 85 01/29/2017   AST 37 (H) 05/12/2019   ALT 37 05/12/2019   PROT 7.4 05/12/2019   ALBUMIN 4.4 01/29/2017   CALCIUM 9.8 10/25/2019   ANIONGAP 9 01/25/2019   GFR 96.29 12/22/2013   Lab Results  Component Value Date   CHOL 182 05/12/2019   Lab Results  Component Value Date   HDL 28 (L) 05/12/2019   Lab Results  Component Value Date   LDLCALC 116 (H) 05/12/2019   Lab Results  Component Value Date   TRIG 267 (H) 05/12/2019   Lab Results  Component Value Date   CHOLHDL 6.5 (H) 05/12/2019   No results found for: HGBA1C     Assessment & Plan:   Problem List Items Addressed This Visit   None   Visit  Diagnoses    Injury of right ankle, initial encounter    -  Primary   Relevant Orders   DG Ankle Complete Right (Completed)   Flu vaccine need       Relevant Orders   Flu Vaccine QUAD 6+ mos PF IM (Fluarix Quad PF) (Completed)       Meds ordered this encounter  Medications  . DISCONTD: celecoxib (CELEBREX) 100 MG capsule    Sig: Take 1 capsule (100 mg total) by mouth 2 (two) times daily.    Dispense:  30 capsule    Refill:  0    Order Specific Question:   Supervising Provider    Answer:   Carlota Raspberry, JEFFREY R [[id number]]   PLAN  Dg ankle shows no fx  Celebrex, continue icing, light duty at work  If worsening or failing to improve, will send to ortho  Patient encouraged to call clinic with any questions, comments, or concerns.  Maximiano Coss, NP

## 2020-07-26 ENCOUNTER — Telehealth: Payer: Self-pay | Admitting: *Deleted

## 2020-07-26 NOTE — Telephone Encounter (Signed)
Dr. Rayann Heman a 3-day hold of Eliquis as requested in this patient prior to a cervical injection.  Previously pharmacy had only recommended a 1 day hold secondary to increased risk.  Can you make a recommendation as to how long the patient can hold Eliquis for this.  Please respond to CV DIV PRE OP  Thanks  Kerin Ransom PA-C 07/26/2020 2:21 PM

## 2020-07-26 NOTE — Telephone Encounter (Signed)
   [facility] Pre-operative Risk Assessment    HEARTCARE STAFF: - Please ensure there is not already an duplicate clearance open for this procedure. - Under Visit Info/Reason for Call, type in Other and utilize the format Clearance MM/DD/YY or Clearance TBD. Do not use dashes or single digits. - If request is for dental extraction, please clarify the # of teeth to be extracted.  Request for surgical clearance:  1. What type of surgery is being performed? CERVICAL ESI   2. When is this surgery scheduled? 08/08/20   3. What type of clearance is required (medical clearance vs. Pharmacy clearance to hold med vs. Both)? BOTH  4. Are there any medications that need to be held prior to surgery and how long? ELIQUIS x 3 DAYS PRIOR   5. Practice name and name of physician performing surgery? [facility]; MD NOT LISTED    6. What is the office phone number? [phone]   7.   What is the office fax number? [phone]  8.   Anesthesia type (None, local, MAC, general) ? NOT LISTED   Ethan Lambert 07/26/2020, 1:24 PM  _________________________________________________________________   (provider comments below)

## 2020-07-26 NOTE — Telephone Encounter (Signed)
The one day hold was for a colonoscopy procedure.  For spinal injections we typically recommend 3 day holds.  However this patient has history of stroke so request input from Dr Rayann Heman regardless

## 2020-08-03 NOTE — Telephone Encounter (Signed)
Verbal from MD Allred. "Hold 3 days if medically necessary and resume as soon as able post op."

## 2020-08-04 NOTE — Telephone Encounter (Signed)
Pt has been made aware of Eliquis instructions, see note from Melina Copa, [place]. Pt is also agreeable f/u appt in the A-fib Clinic in 1 month per Dr. Rayann Heman. Pt advised Stacy, RN from the A-fib clinic will call him and schedule 1 month f/u appt. Pt thanked me for the call and the help. Pt aware he will resume Eliquis once MD doing procedure feels it is safe to resume.

## 2020-08-04 NOTE — Telephone Encounter (Signed)
   Primary Cardiologist: (EP) Dr. Rayann Heman  Chart reviewed as part of pre-operative protocol coverage. Patient was contacted 08/04/2020 in reference to pre-operative risk assessment for pending surgery as outlined below.  Ethan Lambert was last seen 10/2019 by Oda Kilts PA. Amongst other hx in chart, has cardiac h/o CVA, HTN, PAF, OSA. EF 55-60% 2017. In sinus at last OV.   I reached out to patient and he reports he was actually planning on making an appointment to see Dr. Rayann Heman and his team due to recent increase in the amount of afib - now going in and out about every 3 days for varying amounts of time, previously less frequent. Given increased AF recently, I will route to Dr. Rayann Heman for input on whether he is OK with patient proceed with the ESI given the need to hold anticoagulation. Patient will need to begin holding anticoagulation as of tomorrow if cleared. Dr. Rayann Heman - - Please route response to P CV DIV PREOP (the pre-op pool). Thank you!  Charlie Pitter, PA-C 08/04/2020, 8:31 AM

## 2020-08-04 NOTE — Telephone Encounter (Signed)
   Primary Cardiologist: (EP) Dr. Rayann Heman  Chart reviewed as part of pre-operative protocol coverage. I reviewed case with Dr. Rayann Heman who has given approval for patient to proceed with procedure as requested. Regarding holding Eliquis, per his recommendation, hold for 3 days prior to the injection. Resume when felt safe post-operatively. Will route this bundled recommendation to requesting provider via Epic fax function. Please call with questions.  I will also route this message to our nurse callback team for 2 actions: - given time sensitivity for procedure coming up on 2/15, please make sure he knows to hold this the 3 days prior to procedure (last dose this PM) - Dr .Rayann Heman also suggests f/u in the afib clinic in 1 month to address recent increase in burden, please help arrange   Charlie Pitter, PA-C 08/04/2020, 10:27 AM

## 2020-08-10 ENCOUNTER — Telehealth (HOSPITAL_COMMUNITY): Payer: Self-pay

## 2020-08-10 ENCOUNTER — Other Ambulatory Visit (HOSPITAL_COMMUNITY): Payer: Self-pay | Admitting: *Deleted

## 2020-08-10 ENCOUNTER — Other Ambulatory Visit: Payer: Self-pay | Admitting: Internal Medicine

## 2020-08-10 MED ORDER — FLECAINIDE ACETATE 150 MG PO TABS: ORAL_TABLET | ORAL | 0 refills | Status: DC

## 2020-08-10 MED ORDER — FLECAINIDE ACETATE 150 MG PO TABS
ORAL_TABLET | ORAL | 0 refills | Status: DC
Start: 2020-08-10 — End: 2020-08-10

## 2020-08-10 NOTE — Telephone Encounter (Signed)
This pt is still seen at the A-Fib clinic, per Rebecca Eaton, please address

## 2020-08-10 NOTE — Telephone Encounter (Signed)
Patient called in regarding his Flecainide 150mg . He is having more episodes of A-fib. Erxed his Flecainide medication-10 tablets with No refills- [facility]. He is scheduled to see Roderic Palau NP on 3/15. He was told we should see him sooner. Rescheduled his appointment on 2/25 @ 10:30am. Consulted with patient and he verbalized understanding.

## 2020-08-10 NOTE — Telephone Encounter (Signed)
This is a A-Fib clinic pt 

## 2020-08-14 ENCOUNTER — Telehealth: Payer: Self-pay

## 2020-08-14 NOTE — Telephone Encounter (Addendum)
Patient with diagnosis of A Fib on Eliquis for anticoagulation.    Procedure: RIGHT SHOULDER SCOPE, rotator cuff repair, biceps tendesis, subacromial decompression, distal clavicle resection  Date of procedure: 09/04/20   CHA2DS2-VASc Score = 3  This indicates a 3.2% annual risk of stroke. The patient's score is based upon: CHF History: No HTN History: Yes Diabetes History: No Stroke History: Yes Vascular Disease History: No Age Score: 0 Gender Score: 0    CrCl 126 mL/min Platelet count 268K  Patient was previously cleared to hold 3 days for [facility] on 08/03/20 by Dr. Rayann Heman.  However, A Fib burden is increasing.  Has appointment with A Fib clinic on 2/25.  Will pend clearance until patient is seen at A Fib clinic.

## 2020-08-14 NOTE — Telephone Encounter (Signed)
   [facility] Pre-operative Risk Assessment    HEARTCARE STAFF: - Please ensure there is not already an duplicate clearance open for this procedure. - Under Visit Info/Reason for Call, type in Other and utilize the format Clearance MM/DD/YY or Clearance TBD. Do not use dashes or single digits. - If request is for dental extraction, please clarify the # of teeth to be extracted.  Request for surgical clearance:  1. What type of surgery is being performed? RIGHT SHOULDER SCOPE, RCR, BICEPS TENDESIS, SAD, DCR   2. When is this surgery scheduled? 09/04/20   3. What type of clearance is required (medical clearance vs. Pharmacy clearance to hold med vs. Both)? PHARAMCY  4. Are there any medications that need to be held prior to surgery and how long? ELIQUIS NEEDS INSTRUCTIONS    5. Practice name and name of physician performing surgery? [place], [place]   6. What is the office phone number? [phone]   7.   What is the office fax number? [place]   Anesthesia type (None, local, MAC, general) ? CHOICE   Ethan Lambert 08/14/2020, 4:47 PM  _________________________________________________________________   (provider comments below)

## 2020-08-15 ENCOUNTER — Other Ambulatory Visit: Payer: 59

## 2020-08-15 ENCOUNTER — Other Ambulatory Visit: Payer: Self-pay

## 2020-08-15 ENCOUNTER — Other Ambulatory Visit (HOSPITAL_COMMUNITY)
Admission: RE | Admit: 2020-08-15 | Discharge: 2020-08-15 | Disposition: A | Discharge status: Home / Self Care | Payer: 59 | Source: Ambulatory Visit | Attending: Internal Medicine | Admitting: Internal Medicine

## 2020-08-15 DIAGNOSIS — B20 Human immunodeficiency virus [HIV] disease: Secondary | ICD-10-CM | POA: Insufficient documentation

## 2020-08-15 DIAGNOSIS — Z113 Encounter for screening for infections with a predominantly sexual mode of transmission: Secondary | ICD-10-CM | POA: Insufficient documentation

## 2020-08-15 DIAGNOSIS — Z79899 Other long term (current) drug therapy: Secondary | ICD-10-CM

## 2020-08-16 LAB — URINE CYTOLOGY ANCILLARY ONLY
Chlamydia: NEGATIVE
Comment: NEGATIVE
Comment: NORMAL
Neisseria Gonorrhea: NEGATIVE

## 2020-08-16 LAB — T-HELPER CELL (CD4) - (RCID CLINIC ONLY)
CD4 % Helper T Cell: 32 % — ABNORMAL LOW (ref 33–65)
CD4 T Cell Abs: 677 /uL (ref 400–1790)

## 2020-08-18 ENCOUNTER — Encounter (HOSPITAL_COMMUNITY): Payer: Self-pay | Admitting: Nurse Practitioner

## 2020-08-18 ENCOUNTER — Other Ambulatory Visit (HOSPITAL_COMMUNITY): Payer: Self-pay | Admitting: Nurse Practitioner

## 2020-08-18 ENCOUNTER — Ambulatory Visit (HOSPITAL_COMMUNITY)
Admission: RE | Admit: 2020-08-18 | Discharge: 2020-08-18 | Disposition: A | Discharge status: Home / Self Care | Payer: 59 | Source: Ambulatory Visit | Attending: Nurse Practitioner | Admitting: Nurse Practitioner

## 2020-08-18 ENCOUNTER — Other Ambulatory Visit: Payer: Self-pay

## 2020-08-18 VITALS — BP 136/82 | HR 63 | Ht 73.0 in | Wt 275.2 lb

## 2020-08-18 DIAGNOSIS — Z885 Allergy status to narcotic agent status: Secondary | ICD-10-CM | POA: Insufficient documentation

## 2020-08-18 DIAGNOSIS — I10 Essential (primary) hypertension: Secondary | ICD-10-CM | POA: Diagnosis not present

## 2020-08-18 DIAGNOSIS — Z8616 Personal history of COVID-19: Secondary | ICD-10-CM | POA: Insufficient documentation

## 2020-08-18 DIAGNOSIS — Z7901 Long term (current) use of anticoagulants: Secondary | ICD-10-CM | POA: Insufficient documentation

## 2020-08-18 DIAGNOSIS — Z79899 Other long term (current) drug therapy: Secondary | ICD-10-CM | POA: Diagnosis not present

## 2020-08-18 DIAGNOSIS — D6869 Other thrombophilia: Secondary | ICD-10-CM

## 2020-08-18 DIAGNOSIS — I480 Paroxysmal atrial fibrillation: Secondary | ICD-10-CM | POA: Diagnosis not present

## 2020-08-18 DIAGNOSIS — Z8249 Family history of ischemic heart disease and other diseases of the circulatory system: Secondary | ICD-10-CM | POA: Diagnosis not present

## 2020-08-18 LAB — CBC WITH DIFFERENTIAL/PLATELET
Absolute Monocytes: 631 cells/uL (ref 200–950)
Basophils Absolute: 47 cells/uL (ref 0–200)
Basophils Relative: 0.8 %
Eosinophils Absolute: 159 cells/uL (ref 15–500)
Eosinophils Relative: 2.7 %
HCT: 42.8 % (ref 38.5–50.0)
Hemoglobin: 14.7 g/dL (ref 13.2–17.1)
Lymphs Abs: 2159 cells/uL (ref 850–3900)
MCH: 31.5 pg (ref 27.0–33.0)
MCHC: 34.3 g/dL (ref 32.0–36.0)
MCV: 91.8 fL (ref 80.0–100.0)
MPV: 10.2 fL (ref 7.5–12.5)
Monocytes Relative: 10.7 %
Neutro Abs: 2903 cells/uL (ref 1500–7800)
Neutrophils Relative %: 49.2 %
Platelets: 223 10*3/uL (ref 140–400)
RBC: 4.66 10*6/uL (ref 4.20–5.80)
RDW: 13.7 % (ref 11.0–15.0)
Total Lymphocyte: 36.6 %
WBC: 5.9 10*3/uL (ref 3.8–10.8)

## 2020-08-18 LAB — COMPLETE METABOLIC PANEL WITH GFR
AG Ratio: 1.3 (calc) (ref 1.0–2.5)
ALT: 29 U/L (ref 9–46)
AST: 24 U/L (ref 10–35)
Albumin: 4.2 g/dL (ref 3.6–5.1)
Alkaline phosphatase (APISO): 67 U/L (ref 35–144)
BUN: 19 mg/dL (ref 7–25)
CO2: 28 mmol/L (ref 20–32)
Calcium: 9.6 mg/dL (ref 8.6–10.3)
Chloride: 103 mmol/L (ref 98–110)
Creat: 1.05 mg/dL (ref 0.70–1.33)
GFR, Est African American: 90 mL/min/{1.73_m2} (ref >=60)
GFR, Est Non African American: 77 mL/min/{1.73_m2} (ref >=60)
Globulin: 3.2 g/dL (calc) (ref 1.9–3.7)
Glucose, Bld: 148 mg/dL — ABNORMAL HIGH (ref 65–99)
Potassium: 4.5 mmol/L (ref 3.5–5.3)
Sodium: 139 mmol/L (ref 135–146)
Total Bilirubin: 0.9 mg/dL (ref 0.2–1.2)
Total Protein: 7.4 g/dL (ref 6.1–8.1)

## 2020-08-18 LAB — LIPID PANEL
Cholesterol: 191 mg/dL (ref <200)
HDL: 31 mg/dL — ABNORMAL LOW (ref >=40)
LDL Cholesterol (Calc): 115 mg/dL (calc) — ABNORMAL HIGH
Non-HDL Cholesterol (Calc): 160 mg/dL (calc) — ABNORMAL HIGH (ref <130)
Total CHOL/HDL Ratio: 6.2 (calc) — ABNORMAL HIGH (ref <5.0)
Triglycerides: 315 mg/dL — ABNORMAL HIGH (ref <150)

## 2020-08-18 LAB — HIV-1 RNA QUANT-NO REFLEX-BLD
HIV 1 RNA Quant: <20 Copies/mL
HIV-1 RNA Quant, Log: <1.3 Log cps/mL

## 2020-08-18 LAB — RPR: RPR Ser Ql: NONREACTIVE

## 2020-08-18 MED ORDER — DILTIAZEM HCL 30 MG PO TABS: ORAL_TABLET | ORAL | 1 refills | Status: AC

## 2020-08-18 MED ORDER — FLECAINIDE ACETATE 50 MG PO TABS: 50.0000 mg | ORAL_TABLET | Freq: Two times a day (BID) | ORAL | 3 refills | Status: DC

## 2020-08-18 NOTE — Patient Instructions (Signed)
Stop as needed use of flecainide  Tomorrow start flecainide 50mg  twice a day  Cardizem 30mg  -- take 1 tablet every 4 hours AS NEEDED for afib heart rate >100 as long as top of blood pressure >100.

## 2020-08-18 NOTE — Progress Notes (Signed)
Patient ID: Ethan Lambert, male   DOB: [birth year], 61 y.o.   MRN: [id number]     Primary Care Physician: Maximiano Coss, NP Referring Physician: Dr. Becky Sax Ethan Lambert is a 60 y.o. male with a h/o PAF for evaluation s/p ablation 05/28/18. He has done well since then. Has had to take flecainide PIP x one time.  Marland KitchenHe is in SR this am.  No alcohol use, mod caffeine, usually not a trigger, negative sleep study in past.  He did have covid in December and had acute symptoms x one week but  felt fatigued  a little longer than that. He was able to recover at home. He had 11 family members with covid around the same time after Thanksgiving and unfortunately his 74 yo father died from  covid.CHA2DS2VASc score of 3, continues on eliquis.   In the afib clinic today, 08/18/20, for increase in afib burden. He has been taking pill in pocket  flecainide since 2x a week. He has been having more pain in his neck and received a cortisone shot recently and is pending rt shoulder surgery in the near future. 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 Took 300 mg flecainide yesterday for last time at 2 pm.   Today, he denies symptoms of palpitations, chest pain, shortness of breath, orthopnea, PND, lower extremity edema, dizziness, presyncope, syncope, or neurologic sequela. The patient is tolerating medications without difficulties and is otherwise without complaint today.   Past Medical History:  Diagnosis Date  . Anemia   . Arthritis    "probably right ring finger joint" (05/28/2018)  . Childhood asthma    as child none now  . Clotting disorder ([place])   . CVA (cerebral vascular accident) ([place]) 1999 X 2   LEFT FRONTAL & OCCIPITAL , NON-HEMORRHAGIC; no residual  . Family history of malignant neoplasm of gastrointestinal tract   . Heart murmur   . History of blood transfusion 1999   "related to the stroke"  . History of kidney stones   . HIV disease ([place]) dx 1999    MONITORED BY INFECTIOUS DISEASE -- DR [place]  . Hypertension   . Paroxysmal atrial fibrillation (HCC)    CARDIOLOGIST--  DR Rayann Heman  . Right knee meniscal tear   . Seizures ([place])    1999  . Skin cancer    skin  . Sleep apnea    "didn't follow thru w/mask" (05/28/2018)   Past Surgical History:  Procedure Laterality Date  . APPENDECTOMY  06-09-2003  . ATRIAL FIBRILLATION ABLATION  05/28/2018  . ATRIAL FIBRILLATION ABLATION N/A 05/28/2018   Procedure: ATRIAL FIBRILLATION ABLATION;  Surgeon: Thompson Grayer, MD;  Location: [facility];  Service: Cardiovascular;  Laterality: N/A;  . BACK SURGERY    . INGUINAL HERNIA REPAIR Bilateral 1990s  . KNEE ARTHROSCOPY WITH MEDIAL MENISECTOMY Right 12/31/2013   Procedure: RIGHT KNEE ARTHROSCOPY PARTIAL MEDIAL MENISCECTOMY DEBRIDEMENT AND CHONDROPLASTY ;  Surgeon: Sydnee Cabal, MD;  Location: [facility];  Service: Orthopedics;  Laterality: Right;  . KNEE ARTHROSCOPY WITH MEDIAL MENISECTOMY Left 01/27/2019   Procedure: LEFT KNEE ARTHROSCOPY WITH PARTIAL MEDIAL MENISCECTOMY;  Surgeon: Leandrew Koyanagi, MD;  Location: [facility];  Service: Orthopedics;  Laterality: Left;  . LAPAROSCOPIC CHOLECYSTECTOMY  07-08-2003  . LASER ABLATION ANAL CONDYLOMA  05-18-2001  . LUMBAR LAMINECTOMY/DECOMPRESSION MICRODISCECTOMY Left 03/15/2015   Procedure: Left Lumbar five- sacral one microdiskectomy;  Surgeon: Consuella Lose,  MD;  Location: [facility];  Service: Neurosurgery;  Laterality: Left;  Left L5S1 microdiskectomy  . NEGATIVE SLEEP STUDY  01-03-2012  in epic  . SKIN CANCER EXCISION Right    "cut it off my shoulder"  . TRANSTHORACIC ECHOCARDIOGRAM  12-01-2008   MODERATE LVH/  EF 55-60%/  MILD MR/  NEGATIVE BUBBLE STUDY    Current Outpatient Medications  Medication Sig Dispense Refill  . CARTIA XT 240 MG 24 hr capsule TAKE 1 CAPSULE BY MOUTH  DAILY 90 capsule 1  . diltiazem (CARDIZEM) 30 MG tablet Take 1 tablet every 4  hours AS NEEDED for afib heart rate >100 30 tablet 1  . ELIQUIS 5 MG TABS tablet TAKE 1 TABLET BY MOUTH  TWICE DAILY 180 tablet 1  . flecainide (TAMBOCOR) 50 MG tablet Take 1 tablet (50 mg total) by mouth 2 (two) times daily. 60 tablet 3  . TRIUMEQ 600-50-300 MG tablet TAKE 1 TABLET BY MOUTH  DAILY 90 tablet 0   No current facility-administered medications for this encounter.    Allergies  Allergen Reactions  . Morphine Other (See Comments)    hallucinations    Social History   Socioeconomic History  . Marital status: Married    Spouse name: Not on file  . Number of children: 1  . Years of education: Not on file  . Highest education level: Not on file  Occupational History  . 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 Smoking status: Never Smoker  . Smokeless tobacco: Never Used  Vaping Use  . Vaping Use: Never used  Substance and Sexual Activity  . Alcohol use: Yes    Alcohol/week: 0.0 standard drinks    Comment: 05/28/2018 "rarely; 1-2 per month"  . Drug use: Never  . Sexual activity: Yes    Partners: Male  Other Topics Concern  . Not on file  Social History Narrative   Pt lives in [place]. 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 Arrhythmia Father   . Melanoma Father   . Heart attack Brother   . Prostate cancer Maternal Grandfather   . Colon cancer Maternal Grandfather   . Lung cancer Mother        smoker  . Hypertension Mother   . Hyperlipidemia Mother   . Other Maternal Grandmother        harding of the arteries  . Rectal cancer Neg Hx   . Stomach cancer Neg Hx     ROS- All systems are reviewed and  negative except as per the HPI above  Physical Exam: Vitals:   08/18/20 1047  BP: 136/82  Pulse: 63  Weight: 124.8 kg  Height: 6\' 1"  (1.854 m)    GEN- The patient is well appearing, alert and oriented x 3 today.   Head- normocephalic, atraumatic Eyes-  Sclera clear, conjunctiva pink Ears- hearing intact Oropharynx- clear Neck- supple, no JVP Lymph- no cervical lymphadenopathy Lungs- Clear to ausculation bilaterally, normal work of breathing Heart- Regular rate and rhythm, no murmurs, rubs or gallops, PMI not laterally displaced GI- soft, NT, ND, + BS Extremities- no clubbing, cyanosis,  or edema MS- no significant deformity or atrophy Skin- no rash or lesion Psych- euthymic mood, full affect Neuro- strength and sensation are intact  EKG- NSR at 63 bpm, pr int 212 ms, qrs int 120  ms, qtc 431 ms  Epic records reviewed  Assessment and Plan: 1. PAF S/p ablation 2019 Maintaining SR  until recently with an increase in afib burden   Will stop PIP flecainide and start 50 mg bid flecainide daily, starting in am 08/19/20  Continue diltiazem  240 mg daily  Will rx 30 mg Cardizem to use if needed for breakthrough afib  Continue eliquis with CHA2DS2VASc score of 3   2. HTN Stable   F/u Monday for EKG on daily Flecainide   Butch Penny C. Leone Putman, [facility] [address] [place], [place] [address] [phone]

## 2020-08-22 NOTE — Telephone Encounter (Signed)
Ok to hold Eliquis 2 days prior to procedure as long as spinal anesthesia will not be used. Please confirm spinal will not be used. If it is a possibility needs to hold 3 days, but due to stroke hx would prefer 2 days.

## 2020-08-22 NOTE — Telephone Encounter (Signed)
Will route to PharmD for rec's re: holding anticoagulation. Richardson Dopp, PA-C    08/22/2020 2:59 PM

## 2020-08-22 NOTE — Telephone Encounter (Signed)
Are there any contraindications from your standpoint to hold anticoagulation for his upcoming surgery?  I am assuming not since you did not have to do DCCV.  But, pharmacy recently placed recommendations on hold b/c of increased AF burden and need to f/u with AF clinic. Thanks [employer]

## 2020-08-22 NOTE — Telephone Encounter (Signed)
Pt has appt tomorrow 08/23/2020 with Roderic Palau, NP in AF Clinic.  Will fwd to her to address surgical clearance and address anticoagulation management around procedure. Richardson Dopp, PA-C    08/22/2020 9:14 AM

## 2020-08-23 ENCOUNTER — Other Ambulatory Visit: Payer: Self-pay

## 2020-08-23 ENCOUNTER — Ambulatory Visit (HOSPITAL_COMMUNITY)
Admission: RE | Admit: 2020-08-23 | Discharge: 2020-08-23 | Disposition: A | Discharge status: Home / Self Care | Payer: 59 | Source: Ambulatory Visit | Attending: Nurse Practitioner | Admitting: Nurse Practitioner

## 2020-08-23 ENCOUNTER — Telehealth: Payer: Self-pay | Admitting: Cardiology

## 2020-08-23 ENCOUNTER — Other Ambulatory Visit (HOSPITAL_COMMUNITY): Payer: Self-pay | Admitting: Nurse Practitioner

## 2020-08-23 VITALS — BP 140/82 | HR 64

## 2020-08-23 DIAGNOSIS — R079 Chest pain, unspecified: Secondary | ICD-10-CM

## 2020-08-23 DIAGNOSIS — I440 Atrioventricular block, first degree: Secondary | ICD-10-CM | POA: Diagnosis not present

## 2020-08-23 DIAGNOSIS — Z01818 Encounter for other preprocedural examination: Secondary | ICD-10-CM

## 2020-08-23 DIAGNOSIS — I491 Atrial premature depolarization: Secondary | ICD-10-CM | POA: Insufficient documentation

## 2020-08-23 DIAGNOSIS — I480 Paroxysmal atrial fibrillation: Secondary | ICD-10-CM

## 2020-08-23 NOTE — Progress Notes (Signed)
Pt in for ekg for restart of flecainide 50 mg bid for pt's c/o of more irregular heart beat recently. He had been taking 300 mg PIP for breakthrough afib but do to increase burden placed him on 50 mg bid. 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 He stopped what he was doing and went home and noted an arrhythmia that sounded like premature contractions. He states that it was not afib. He shows PAC's on his EKG today. I feel with increase of palpitations recently, pending shoulder surgery, an episode  the other day that involved chest discomfort and the fact that he has been placed on daily flecainide, he needs a stress test to assess for CAD and stratification of risk with pending rt shoulder surgery. I will order a stress Myoview/echo and f/u with Richardson Dopp to assign risk for surgery. His shoulder surgery is scheduled for 3/14 so will try to expedidite this.  Ekg today shows SR with first degree AV block with pr int 216 ms, qrs int 122 ms, qtc 416 ms. I will also discuss with Richardson Dopp, PA,

## 2020-08-23 NOTE — Telephone Encounter (Signed)
   Primary Cardiologist: Dr. Thompson Grayer, MD   Chart reviewed as part of pre-operative protocol coverage. Given past medical history and time since last visit, based on ACC/AHA guidelines, Ethan Lambert would be at acceptable risk for the planned procedure without further cardiovascular testing.   Per pharmacy:  Ok to hold Eliquis 2 days prior to procedure as long as spinal anesthesia will not be used. Please confirm spinal will not be used. If it is a possibility needs to hold 3 days, but due to stroke hx would prefer 2 days.   I will route this recommendation to the requesting party via Epic fax function and remove from pre-op pool.  Please call with questions.  Kathyrn Drown, NP 08/23/2020, 9:39 AM

## 2020-08-23 NOTE — Telephone Encounter (Signed)
    This patients information was sent to [facility] for clearance however was seen by Roderic Palau, NP-C with complaints of chest pain. Stress test and an echocardiogram were ordered. Given his new symptoms, he cannot be cleared for shoulder surgery until these tests have been completed.   I will call to inform the requesting providers team of the new finding.   Kathyrn Drown NP-C [place] Pager: [phone]

## 2020-08-23 NOTE — Telephone Encounter (Signed)
   [facility] Pre-operative Risk Assessment    HEARTCARE STAFF: - Please ensure there is not already an duplicate clearance open for this procedure. - Under Visit Info/Reason for Call, type in Other and utilize the format Clearance MM/DD/YY or Clearance TBD. Do not use dashes or single digits. - If request is for dental extraction, please clarify the # of teeth to be extracted.  Request for surgical clearance:  1. What type of surgery is being performed? RIGHT SHOULDER SCOPE, RCR, BICEPS TENODESIS, SAD, DCR   2. When is this surgery scheduled? 09/04/20   3. What type of clearance is required (medical clearance vs. Pharmacy clearance to hold med vs. Both)? BOTH  4. Are there any medications that need to be held prior to surgery and how long? ELIQUIS   5. Practice name and name of physician performing surgery? [facility] ; DR. Corene Cornea ROGERS   6. What is the office phone number? [phone]   7.   What is the office fax number? [place]  8.   Anesthesia type (None, local, MAC, general) ? CHOICE   Julaine Hua 08/23/2020, 4:40 PM  _________________________________________________________________   (provider comments below)

## 2020-08-24 NOTE — Telephone Encounter (Signed)
Per yesterdays note  "This patients information was sent to [facility] for clearance however was seen by Roderic Palau, NP-C with complaints of chest pain. Stress test and an echocardiogram were ordered. Given his new symptoms, he cannot be cleared for shoulder surgery until these tests have been completed.   I will call to inform the requesting providers team of the new finding.   Kathyrn Drown NP-C [place] Pager: [phone]".   Coverings staff, please let provider office that patient needs above study and then office visit prior to surgical clearance.

## 2020-08-24 NOTE — Telephone Encounter (Signed)
Per pre op provider today, (Vin Bhagat, [facility]) pt will need an appt with Gen cards. Pt will need to be set up a new pt appt with Gen cards. Will send message to scheduling to set up New Pt appt.

## 2020-08-24 NOTE — Addendum Note (Signed)
Addended by: Michae Kava on: 08/24/2020 03:06 PM   Modules accepted: Orders

## 2020-08-24 NOTE — Telephone Encounter (Signed)
Spoke with Judeen Hammans and discussed need of stress test and echo and then ov. They are going to cancel the surgery.

## 2020-08-24 NOTE — Telephone Encounter (Signed)
Per Roderic Palau  "Pt in for ekg for restart of flecainide 50 mg bid for pt's c/o of more irregular heart beat recently. He had been taking 300 mg PIP for breakthrough afib but do to increase burden placed him on 50 mg bid. 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 He stopped what he was doing and went home and noted an arrhythmia that sounded like premature contractions. He states that it was not afib. He shows PAC's on his EKG today. I feel with increase of palpitations recently, pending shoulder surgery, an episode  the other day that involved chest discomfort and the fact that he has been placed on daily flecainide, he needs a stress test to assess for CAD and stratification of risk with pending rt shoulder surgery. I will order a stress Myoview/echo and f/u with Richardson Dopp to assign risk for surgery. His shoulder surgery is scheduled for 3/14 so will try to expedidite this.  Ekg today shows SR with first degree AV block with pr int 216 ms, qrs int 122 ms, qtc 416 ms. I will also discuss with Richardson Dopp, PA".

## 2020-08-24 NOTE — Telephone Encounter (Signed)
I left a message for Orson Slick, surgery scheduler for Dr. Stann Mainland, pt was recently seen by our A-fib clinic 08/18/20 and c/o chest pain. Per pre op provider the pt is being set up for testing per Roderic Palau, NP ov note. Pt will not be cleared until testing has been done and reviewed by cardiologist. If any questions please call [phone] and ask for the pre op provider.

## 2020-08-25 ENCOUNTER — Encounter (HOSPITAL_COMMUNITY): Payer: Self-pay | Admitting: *Deleted

## 2020-08-25 ENCOUNTER — Telehealth (HOSPITAL_COMMUNITY): Payer: Self-pay | Admitting: *Deleted

## 2020-08-25 NOTE — Telephone Encounter (Signed)
   Primary Cardiologist: Werner Lean, MD  Chart reviewed as part of pre-operative protocol coverage. Because of Ethan Lambert's past medical history and time since last visit, he will require a follow-up visit in order to better assess preoperative cardiovascular risk.  He has Lexiscan Myoview upcoming 08/28/20 and echocardiogram as well as new patient appointment with Dr. Gasper Sells scheduled for 09/14/20.   I will route to Dr. Gasper Sells so he is aware. I will remove from preoperative pool as clearance will be addressed at upcoming office visit and recommendations provided at that time. Will route to requesting party so they are aware via Epic fax function.    Loel Dubonnet, NP  08/25/2020, 9:40 AM

## 2020-08-25 NOTE — Telephone Encounter (Signed)
Patient given detailed instructions per Myocardial Perfusion Study Information Sheet for the test on 08/28/20 at 1030. Patient notified to arrive 15 minutes early and that it is imperative to arrive on time for appointment to keep from having the test rescheduled.  If you need to cancel or reschedule your appointment, please call the office within 24 hours of your appointment. . Patient verbalized understanding.Ethan Lambert, [place]anae [place]

## 2020-08-28 ENCOUNTER — Ambulatory Visit (HOSPITAL_COMMUNITY): Payer: 59 | Attending: Internal Medicine

## 2020-08-28 ENCOUNTER — Other Ambulatory Visit: Payer: Self-pay

## 2020-08-28 DIAGNOSIS — R079 Chest pain, unspecified: Secondary | ICD-10-CM

## 2020-08-28 LAB — MYOCARDIAL PERFUSION IMAGING
LV dias vol: 139 mL (ref 62–150)
LV sys vol: 56 mL
Peak HR: 94 {beats}/min
Rest HR: 65 {beats}/min
SDS: 5
SRS: 0
SSS: 5
TID: 1

## 2020-08-28 MED ORDER — TECHNETIUM TC 99M TETROFOSMIN IV KIT
31.2000 | PACK | Freq: Once | INTRAVENOUS | Status: AC | PRN
  Administered 2020-08-28: 31.2 via INTRAVENOUS
  Filled 2020-08-28: qty 32

## 2020-08-28 MED ORDER — TECHNETIUM TC 99M TETROFOSMIN IV KIT
11.0000 | PACK | Freq: Once | INTRAVENOUS | Status: AC | PRN
  Administered 2020-08-28: 11 via INTRAVENOUS
  Filled 2020-08-28: qty 11

## 2020-08-28 MED ORDER — REGADENOSON 0.4 MG/5ML IV SOLN
0.4000 mg | Freq: Once | INTRAVENOUS | Status: AC
  Administered 2020-08-28: 0.4 mg via INTRAVENOUS

## 2020-09-01 ENCOUNTER — Ambulatory Visit: Payer: 59 | Admitting: Internal Medicine

## 2020-09-01 ENCOUNTER — Other Ambulatory Visit: Payer: Self-pay

## 2020-09-01 ENCOUNTER — Encounter: Payer: Self-pay | Admitting: Internal Medicine

## 2020-09-01 VITALS — BP 151/92 | HR 65 | Temp 98.6°F | Ht 73.0 in | Wt 277.0 lb

## 2020-09-01 DIAGNOSIS — B20 Human immunodeficiency virus [HIV] disease: Secondary | ICD-10-CM | POA: Diagnosis not present

## 2020-09-01 DIAGNOSIS — Z5181 Encounter for therapeutic drug level monitoring: Secondary | ICD-10-CM | POA: Diagnosis not present

## 2020-09-01 DIAGNOSIS — Z23 Encounter for immunization: Secondary | ICD-10-CM

## 2020-09-01 DIAGNOSIS — Z113 Encounter for screening for infections with a predominantly sexual mode of transmission: Secondary | ICD-10-CM

## 2020-09-01 DIAGNOSIS — Z79899 Other long term (current) drug therapy: Secondary | ICD-10-CM

## 2020-09-01 MED ORDER — TRIUMEQ 600-50-300 MG PO TABS: 1.0000 | ORAL_TABLET | Freq: Every day | ORAL | 11 refills | Status: DC

## 2020-09-01 NOTE — Assessment & Plan Note (Signed)
He continues to do well with Triumeq and no concerns.  We discussed newer regimens but with possible insurance changes, at this time will stick with the same.  If he has any changes, he will let us know, otherwise he will follow up in 1 year.

## 2020-09-01 NOTE — Assessment & Plan Note (Signed)
Screened negative.  Stable partner (husband) and no concerns.

## 2020-09-01 NOTE — Assessment & Plan Note (Signed)
Creat, LFTs, no issues.  Will continue to monitor yearly

## 2020-09-01 NOTE — Progress Notes (Signed)
   Subjective:    Patient ID: Ethan Lambert, male    DOB: Nov 11, 1960, 60 y.o.   MRN: [id number]  HPI Here for follow up of HIV He continues on Triumeq and denies any missed doses.  His labs are reassuring with a CD4 of 677 and viral load < 20.  Creat, LFTs wnl.  He is presently on leave from his job due to shoulder and neck pain with cervical bulging discs and after cardiac clearance will get surgical intervention.  He is hopeful to be able to return to work.    Review of Systems  Constitutional: Negative for fatigue.  Gastrointestinal: Negative for diarrhea and nausea.  Skin: Negative for rash.       Objective:   Physical Exam Eyes:     General: No scleral icterus. Cardiovascular:     Rate and Rhythm: Regular rhythm.  Pulmonary:     Effort: Pulmonary effort is normal.  Neurological:     General: No focal deficit present.     Mental Status: He is alert.  Psychiatric:        Mood and Affect: Mood normal.   SH: no tobacco        Assessment & Plan:

## 2020-09-05 ENCOUNTER — Ambulatory Visit (HOSPITAL_COMMUNITY): Payer: 59 | Admitting: Nurse Practitioner

## 2020-09-14 ENCOUNTER — Other Ambulatory Visit: Payer: Self-pay

## 2020-09-14 ENCOUNTER — Ambulatory Visit (HOSPITAL_COMMUNITY): Payer: 59 | Attending: Cardiovascular Disease

## 2020-09-14 ENCOUNTER — Ambulatory Visit: Payer: 59 | Admitting: Internal Medicine

## 2020-09-14 ENCOUNTER — Encounter: Payer: Self-pay | Admitting: Internal Medicine

## 2020-09-14 VITALS — BP 144/84 | HR 71 | Ht 73.0 in | Wt 280.8 lb

## 2020-09-14 DIAGNOSIS — E785 Hyperlipidemia, unspecified: Secondary | ICD-10-CM | POA: Insufficient documentation

## 2020-09-14 DIAGNOSIS — I480 Paroxysmal atrial fibrillation: Secondary | ICD-10-CM | POA: Insufficient documentation

## 2020-09-14 DIAGNOSIS — R079 Chest pain, unspecified: Secondary | ICD-10-CM | POA: Insufficient documentation

## 2020-09-14 DIAGNOSIS — R0600 Dyspnea, unspecified: Secondary | ICD-10-CM | POA: Diagnosis not present

## 2020-09-14 DIAGNOSIS — R0609 Other forms of dyspnea: Secondary | ICD-10-CM | POA: Insufficient documentation

## 2020-09-14 DIAGNOSIS — J40 Bronchitis, not specified as acute or chronic: Secondary | ICD-10-CM | POA: Insufficient documentation

## 2020-09-14 DIAGNOSIS — B20 Human immunodeficiency virus [HIV] disease: Secondary | ICD-10-CM

## 2020-09-14 LAB — ECHOCARDIOGRAM COMPLETE
AR max vel: 2.89 cm2
AV Area VTI: 2.62 cm2
AV Area mean vel: 2.68 cm2
AV Mean grad: 7 mmHg
AV Peak grad: 11.6 mmHg
Ao pk vel: 1.7 m/s
Area-P 1/2: 3.99 cm2
P 1/2 time: 564 msec
S' Lateral: 3.2 cm

## 2020-09-14 MED ORDER — FLECAINIDE ACETATE 50 MG PO TABS: 50.0000 mg | ORAL_TABLET | Freq: Two times a day (BID) | ORAL | 0 refills | Status: DC

## 2020-09-14 MED ORDER — ROSUVASTATIN CALCIUM 5 MG PO TABS
5.0000 mg | ORAL_TABLET | Freq: Every day | ORAL | 3 refills | Status: DC
Start: 2020-09-14 — End: 2020-10-12

## 2020-09-14 MED ORDER — METOPROLOL TARTRATE 50 MG PO TABS: ORAL_TABLET | ORAL | 0 refills | Status: DC

## 2020-09-14 MED ORDER — PERFLUTREN LIPID MICROSPHERE
1.0000 mL | INTRAVENOUS | Status: AC | PRN
  Administered 2020-09-14: 1 mL via INTRAVENOUS

## 2020-09-14 NOTE — Progress Notes (Signed)
Cardiology Office Note:    Date:  09/14/2020   ID:  Ethan Lambert, DOB Dec 08, 1960, MRN [id number]  PCP:  Ethan Coss, NP   [place]  Cardiologist:  Ethan Lean, MD  Advanced Practice Provider:  No care team member to display Electrophysiologist:  Ethan Grayer, MD       CC:  New Patient to Gen Cards- Shoulder Surgery eval  History of Present Illness:    Ethan Lambert is a 60 y.o. male with a hx of PAF s/p Ablation and flecainide PIP CHADSVASC 3 on eliquis. HTN who presents for evaluation 09/14/20.  Patient notes that he is feeling ok except for the neck and shoulder.  Has a torn meniscus on both shoulders and   Has had no chest pain, chest pressure, chest tightness, chest stinging.  In the past had some band likechest burning prior to seeing Ethan Lambert.    Discomfort occurs as shortness of breath with exertion like going up and down stairs or working on his properties:  worsens with AF RVR, and improves with .  Patient exertion notable for going up and down stairs and feels SOB.  No No PND or orthopnea. Notes daytime somnolence despite three negative sleep studies. Notes, weight gain, no leg swelling or abdominal swelling outside of fat (per patient).  No syncope or but notes near syncope with exertion.  Ambulatory BP is 144/84.  Past Medical History:  Diagnosis Date  . Anemia   . Arthritis    "probably right ring finger joint" (05/28/2018)  . Childhood asthma    as child none now  . Clotting disorder ([place])   . CVA (cerebral vascular accident) ([place]) 1999 X 2   LEFT FRONTAL & OCCIPITAL , NON-HEMORRHAGIC; no residual  . Family history of malignant neoplasm of gastrointestinal tract   . Heart murmur   . History of blood transfusion 1999   "related to the stroke"  . History of kidney stones   . 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 Hypertension   . Paroxysmal atrial fibrillation (HCC)     CARDIOLOGIST--  DR Ethan Lambert  . Right knee meniscal tear   . Seizures ([place])    1999  . Skin cancer    skin  . Sleep apnea    "didn't follow thru w/mask" (05/28/2018)    Past Surgical History:  Procedure Laterality Date  . APPENDECTOMY  06-09-2003  . ATRIAL FIBRILLATION ABLATION  05/28/2018  . ATRIAL FIBRILLATION ABLATION N/A 05/28/2018   Procedure: ATRIAL FIBRILLATION ABLATION;  Surgeon: Ethan Grayer, MD;  Location: [facility];  Service: Cardiovascular;  Laterality: N/A;  . BACK SURGERY    . INGUINAL HERNIA REPAIR Bilateral 1990s  . KNEE ARTHROSCOPY WITH MEDIAL MENISECTOMY Right 12/31/2013   Procedure: RIGHT KNEE ARTHROSCOPY PARTIAL MEDIAL MENISCECTOMY DEBRIDEMENT AND CHONDROPLASTY ;  Surgeon: Sydnee Cabal, MD;  Location: [place] [facility];  Service: Orthopedics;  Laterality: Right;  . KNEE ARTHROSCOPY WITH MEDIAL MENISECTOMY Left 01/27/2019   Procedure: LEFT KNEE ARTHROSCOPY WITH PARTIAL MEDIAL MENISCECTOMY;  Surgeon: Leandrew Koyanagi, MD;  Location: [place];  Service: Orthopedics;  Laterality: Left;  . LAPAROSCOPIC CHOLECYSTECTOMY  07-08-2003  . LASER ABLATION ANAL CONDYLOMA  05-18-2001  . LUMBAR LAMINECTOMY/DECOMPRESSION MICRODISCECTOMY Left 03/15/2015   Procedure: Left Lumbar five- sacral one microdiskectomy;  Surgeon: Consuella Lose, MD;  Location: [facility];  Service: Neurosurgery;  Laterality: Left;  Left  L5S1 microdiskectomy  . NEGATIVE SLEEP STUDY  01-03-2012  in epic  . SKIN CANCER EXCISION Right    "cut it off my shoulder"  . TRANSTHORACIC ECHOCARDIOGRAM  12-01-2008   MODERATE LVH/  EF 55-60%/  MILD MR/  NEGATIVE BUBBLE STUDY    Current Medications: Current Meds  Medication Sig  . abacavir-dolutegravir-lamiVUDine (TRIUMEQ) 600-50-300 MG tablet Take 1 tablet by mouth daily.  [name] CARTIA XT 240 MG 24 hr capsule TAKE 1 CAPSULE BY MOUTH  DAILY  . diltiazem (CARDIZEM) 30 MG tablet Take 1 tablet every 4 hours AS NEEDED for afib heart rate >100   . ELIQUIS 5 MG TABS tablet TAKE 1 TABLET BY MOUTH  TWICE DAILY  . metoprolol tartrate (LOPRESSOR) 50 MG tablet Take 2 hours prior to Cardiac CT  . rosuvastatin (CRESTOR) 5 MG tablet Take 1 tablet (5 mg total) by mouth daily.  . [DISCONTINUED] flecainide (TAMBOCOR) 50 MG tablet Take 1 tablet (50 mg total) by mouth 2 (two) times daily.     Allergies:   Morphine   Social History   Socioeconomic History  . Marital status: Married    Spouse name: Not on file  . Number of children: 1  . Years of education: Not on file  . Highest education level: Not on file  Occupational History  . 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 Smoking status: Never Smoker  . Smokeless tobacco: Never Used  Vaping Use  . Vaping Use: Never used  Substance and Sexual Activity  . Alcohol use: Yes    Alcohol/week: 0.0 standard drinks    Comment: 05/28/2018 "rarely; 1-2 per month"  . Drug use: Never  . Sexual activity: Yes    Partners: Male    Comment: offered condoms  Other Topics Concern  . Not on file  Social History Narrative   Pt lives in [place]. 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 There is no history of Rectal cancer or Stomach cancer. History of coronary artery disease notable for two brothers in their six. History of heart failure notable for no membrs. History of arrhythmia notable for  father having A fib. Denies family history of sudden cardiac death including drowning, car accidents, or unexplained deaths in the family. No history of bicuspid aortic valve or aortic aneurysm or dissection.   ROS:   Please see the history of present illness.     All other systems reviewed and are negative.  EKGs/Labs/Other Studies Reviewed:    The following studies were reviewed today:  EKG:  08/23/20:  SR 64 1st AV block  Transthoracic Echocardiogram: Date: 9/20/217 Results: Study Conclusions   - Left ventricle: The cavity size was normal. Wall thickness was  increased in a pattern of mild LVH. Systolic function was normal.  The estimated ejection fraction was in the range of 55% to 60%.  Wall motion was normal; there were no regional wall motion  abnormalities. Left ventricular diastolic function parameters  were normal.  - Aortic valve: There was no stenosis. There was trivial  regurgitation.  - Mitral valve: There was no significant regurgitation.  - Right ventricle: The cavity size was normal. Systolic function  was normal.  - Right atrium: The atrium was mildly dilated.  - Pulmonary arteries: PA peak pressure: 28 mm Hg (S).  - Systemic veins: IVC measured 2.5 cm with > 50% respirophasic  variation, suggesting RA pressure 8 mmHg.   Echo done today:  Mild AI normal LV function; Aortic Valve calcification   Cardiac CT PV: Date: 2019 Results: IMPRESSION: 1. There is normal pulmonary vein drainage into the left atrium.  2. The left atrial appendage is large with chicken wing morphology and no thrombus.  3. The esophagus runs in the left atrial midline and is not in the proximity to any of the pulmonary veins.  4. Calcium score was 175 that represents 83 percentile for age/sex.  ECG or NM Stress Testing : Date: 09/14/2020 Results:   Nuclear stress EF: 60%.  No T wave inversion was noted during stress.  There was no ST segment deviation  noted during stress.  Defect 1: There is a medium defect of moderate severity present in the apical septal, apical lateral and apex location.  This is a low risk study.   Medium size, moderate intensity fixed apical, apical septal and apical lateral perfusion defect, suspicious for artifact. No significant reversible ischemia. LVEF 60% with normal wall motion. This is a low risk study. No prior for comparison.   Recent Labs: 08/15/2020: ALT 29; BUN 19; Creat 1.05; Hemoglobin 14.7; Platelets 223; Potassium 4.5; Sodium 139  Recent Lipid Panel    Component Value Date/Time   CHOL 191 08/15/2020 0920   TRIG 315 (H) 08/15/2020 0920   TRIG 369 02/16/2010 0000   HDL 31 (L) 08/15/2020 0920   CHOLHDL 6.2 (H) 08/15/2020 0920   VLDL 36 (H) 11/27/2015 0915   LDLCALC 115 (H) 08/15/2020 0920    Risk Assessment/Calculations:    CHA2DS2-VASc Score = 3  This indicates a 3.2% annual risk of stroke. The patient's score is based upon: CHF History: No HTN History: Yes Diabetes History: No Stroke History: Yes Vascular Disease History: No Age Score: 0 Gender Score: 0      Physical Exam:    VS:  BP (!) 144/84   Pulse 71   Ht 6\' 1"  (1.854 m)   Wt 280 lb 12.8 oz (127.4 kg)   SpO2 94%   BMI 37.05 kg/m     Wt Readings from Last 3 Encounters:  09/14/20 280 lb 12.8 oz (127.4 kg)  09/01/20 277 lb (125.6 kg)  08/28/20 275 lb (124.7 kg)    GEN: Obese, well developed in no acute distress HEENT: Bilateral Frank's Sign NECK: No JVD; No carotid bruits LYMPHATICS: No lymphadenopathy CARDIAC: RRR, no murmurs, rubs, gallops RESPIRATORY:  Clear to auscultation without rales, wheezing or rhonchi  ABDOMEN: Soft, non-tender, non-distended MUSCULOSKELETAL:  No edema; No deformity  SKIN: Warm and dry NEUROLOGIC:  Alert and oriented x 3 PSYCHIATRIC:  Normal affect   ASSESSMENT:    1. Hyperlipidemia, unspecified hyperlipidemia type   2. Chest pain, unspecified type   3. DOE (dyspnea on  exertion)   4. Morbid obesity (HCC)   5. Paroxysmal atrial fibrillation ([place])   6. HIV infection, unspecified symptom status ([place])    PLAN:    In order of problems listed above:  Morbid Obesity with  HTN and HLD HIV on HAART Need for shoulder surgery PAF on Flecainide and Eliquis - continuing medication for now; discussed the risks and benefits of flecainide given his  CP Stress tests with fixed defect but likely artifact, and his DOE - will get CCTA with CT/FFR - Will start rosuvastatin 5mg  PO  - Will start ambulatory BP monitoring with low threshold to start thiazide or ARB - continue AC; will reach out to EP about other AAD  Needs expedited CT scan and follow up in 3 weeks- patient is running out of insurance. May need Cath        Medication Adjustments/Labs and Tests Ordered: Current medicines are reviewed at length with the patient today.  Concerns regarding medicines are outlined above.  Orders Placed This Encounter  Procedures  . CT CORONARY MORPH W/CTA COR W/SCORE W/CA W/CM &/OR WO/CM  . CT CORONARY FRACTIONAL FLOW RESERVE DATA PREP  . CT CORONARY FRACTIONAL FLOW RESERVE FLUID ANALYSIS  . Basic metabolic panel  . Hepatic function panel  . Lipid panel   Meds ordered this encounter  Medications  . flecainide (TAMBOCOR) 50 MG tablet    Sig: Take 1 tablet (50 mg total) by mouth 2 (two) times daily.    Dispense:  30 tablet    Refill:  0    D/c flecainide 150mg  PRN order  . metoprolol tartrate (LOPRESSOR) 50 MG tablet    Sig: Take 2 hours prior to Cardiac CT    Dispense:  1 tablet    Refill:  0  . rosuvastatin (CRESTOR) 5 MG tablet    Sig: Take 1 tablet (5 mg total) by mouth daily.    Dispense:  90 tablet    Refill:  3    Patient Instructions  Medication Instructions:  Your physician has recommended you make the following change in your medication: START: rosuvastatin 5 mg by mouth daily *If you need a refill on your cardiac medications before your next  appointment, please call your pharmacy*   Lab Work: IN 3 MONTHS: fasting lipid panel and liver function test If you have labs (blood work) drawn today and your tests are completely normal, you will receive your results only by: [name] MyChart Message (if you have MyChart) OR . A paper copy in the mail If you have any lab test that is abnormal or we need to change your treatment, we will call you to review the results.   Testing/Procedures: Your physician has requested that you have a Cardiac CT.   Follow-Up: At [facility], you and your health needs are our priority.  As part of our continuing mission to provide you with exceptional heart care, we have created designated Provider Care Teams.  These Care Teams include your primary Cardiologist (physician) and Advanced Practice Providers (APPs -  Physician Assistants and Nurse Practitioners) who all work together to provide you with the care you need, when you need it.  We recommend signing up for the patient portal called "MyChart".  Sign up information is provided on this After Visit Summary.  MyChart is used to connect with patients for Virtual Visits (Telemedicine).  Patients are able to view lab/test results, encounter notes, upcoming appointments, etc.  Non-urgent messages can be sent to your provider as well.   To learn more about what you can do with MyChart, go to [email or web address].    Your next appointment:   3-4 week(s)  The format for your next appointment:   In Person  Provider:  You may see Ethan Lean, MD or one of the following Advanced Practice Providers on your designated Care Team:    Melina Copa, PA-C  Ermalinda Barrios, PA-C    Other Instructions Your cardiac CT will be scheduled at one of the below locations:   [facility] [address] [place], [place] [address] [phone]   Please arrive at the [facility] main entrance (entrance A) of [facility] 30 minutes prior  to test start time. Proceed to the [facility] Radiology Department (first floor) to check-in and test prep.   Please follow these instructions carefully (unless otherwise directed):  Hold all erectile dysfunction medications at least 3 days (72 hrs) prior to test.  On the Night Before the Test: . Be sure to Drink plenty of water. . Do not consume any caffeinated/decaffeinated beverages or chocolate 12 hours prior to your test. . Do not take any antihistamines 12 hours prior to your test.   On the Day of the Test: . Drink plenty of water until 1 hour prior to the test. . Do not eat any food 4 hours prior to the test. . You may take your regular medications prior to the test.  . Take metoprolol (Lopressor) 50 mg  two hours prior to test.        After the Test: . Drink plenty of water. . After receiving IV contrast, you may experience a mild flushed feeling. This is normal. . On occasion, you may experience a mild rash up to 24 hours after the test. This is not dangerous. If this occurs, you can take Benadryl 25 mg and increase your fluid intake. . If you experience trouble breathing, this can be serious. If it is severe call 911 IMMEDIATELY. If it is mild, please call our office. . If you take any of these medications: Glipizide/Metformin, Avandament, Glucavance, please do not take 48 hours after completing test unless otherwise instructed.   Once we have confirmed authorization from your insurance company, we will call you to set up a date and time for your test. Based on how quickly your insurance processes prior authorizations requests, please allow up to 4 weeks to be contacted for scheduling your Cardiac CT appointment. Be advised that routine Cardiac CT appointments could be scheduled as many as 8 weeks after your provider has ordered it.  For non-scheduling related questions, please contact the cardiac imaging nurse navigator should you have any questions/concerns: Marchia Bond,  Cardiac Imaging Nurse Navigator Gordy Clement, Cardiac Imaging Nurse Navigator [facility] Direct Office Dial: [phone]   For scheduling needs, including cancellations and rescheduling, please call [place], [phone].       Signed, Ethan Lean, MD  09/14/2020 9:39 AM    [facility]

## 2020-09-14 NOTE — Patient Instructions (Signed)
Medication Instructions:  Your physician has recommended you make the following change in your medication: START: rosuvastatin 5 mg by mouth daily *If you need a refill on your cardiac medications before your next appointment, please call your pharmacy*   Lab Work: IN 3 MONTHS: fasting lipid panel and liver function test If you have labs (blood work) drawn today and your tests are completely normal, you will receive your results only by: [name] MyChart Message (if you have MyChart) OR . A paper copy in the mail If you have any lab test that is abnormal or we need to change your treatment, we will call you to review the results.   Testing/Procedures: Your physician has requested that you have a Cardiac CT.   Follow-Up: At [facility], you and your health needs are our priority.  As part of our continuing mission to provide you with exceptional heart care, we have created designated Provider Care Teams.  These Care Teams include your primary Cardiologist (physician) and Advanced Practice Providers (APPs -  Physician Assistants and Nurse Practitioners) who all work together to provide you with the care you need, when you need it.  We recommend signing up for the patient portal called "MyChart".  Sign up information is provided on this After Visit Summary.  MyChart is used to connect with patients for Virtual Visits (Telemedicine).  Patients are able to view lab/test results, encounter notes, upcoming appointments, etc.  Non-urgent messages can be sent to your provider as well.   To learn more about what you can do with MyChart, go to [email or web address].    Your next appointment:   3-4 week(s)  The format for your next appointment:   In Person  Provider:   You may see Werner Lean, MD or one of the following Advanced Practice Providers on your designated Care Team:    Melina Copa, PA-C  Ermalinda Barrios, PA-C    Other Instructions Your cardiac CT will be scheduled at one  of the below locations:   [facility] [address] [place], [place] [address] [phone]   Please arrive at the [facility] main entrance (entrance A) of [facility] 30 minutes prior to test start time. Proceed to the [facility] Radiology Department (first floor) to check-in and test prep.   Please follow these instructions carefully (unless otherwise directed):  Hold all erectile dysfunction medications at least 3 days (72 hrs) prior to test.  On the Night Before the Test: . Be sure to Drink plenty of water. . Do not consume any caffeinated/decaffeinated beverages or chocolate 12 hours prior to your test. . Do not take any antihistamines 12 hours prior to your test.   On the Day of the Test: . Drink plenty of water until 1 hour prior to the test. . Do not eat any food 4 hours prior to the test. . You may take your regular medications prior to the test.  . Take metoprolol (Lopressor) 50 mg  two hours prior to test.        After the Test: . Drink plenty of water. . After receiving IV contrast, you may experience a mild flushed feeling. This is normal. . On occasion, you may experience a mild rash up to 24 hours after the test. This is not dangerous. If this occurs, you can take Benadryl 25 mg and increase your fluid intake. . If you experience trouble breathing, this can be serious. If it is severe call 911 IMMEDIATELY. If  it is mild, please call our office. . If you take any of these medications: Glipizide/Metformin, Avandament, Glucavance, please do not take 48 hours after completing test unless otherwise instructed.   Once we have confirmed authorization from your insurance company, we will call you to set up a date and time for your test. Based on how quickly your insurance processes prior authorizations requests, please allow up to 4 weeks to be contacted for scheduling your Cardiac CT appointment. Be advised that routine Cardiac CT appointments  could be scheduled as many as 8 weeks after your provider has ordered it.  For non-scheduling related questions, please contact the cardiac imaging nurse navigator should you have any questions/concerns: Marchia Bond, Cardiac Imaging Nurse Navigator Gordy Clement, Cardiac Imaging Nurse Navigator [place] [facility] Direct Office Dial: [phone]   For scheduling needs, including cancellations and rescheduling, please call [place], [phone].

## 2020-10-04 ENCOUNTER — Telehealth (HOSPITAL_COMMUNITY): Payer: Self-pay | Admitting: *Deleted

## 2020-10-04 NOTE — Telephone Encounter (Signed)
Reaching out to patient to offer assistance regarding upcoming cardiac imaging study; pt verbalizes understanding of appt date/time, parking situation and where to check in, pre-test NPO status and medications ordered, and verified current allergies; name and call back number provided for further questions should they arise  Gordy Clement RN Navigator Cardiac Imaging [facility] [phone] office [phone] cell  Pt to take daily Cartia and 50mg  metoprolol tartrate 2 hours prior test.

## 2020-10-05 ENCOUNTER — Ambulatory Visit (HOSPITAL_COMMUNITY)
Admission: RE | Admit: 2020-10-05 | Discharge: 2020-10-05 | Disposition: A | Discharge status: Home / Self Care | Payer: 59 | Source: Ambulatory Visit | Attending: Internal Medicine | Admitting: Internal Medicine

## 2020-10-05 ENCOUNTER — Other Ambulatory Visit: Payer: Self-pay

## 2020-10-05 ENCOUNTER — Encounter (HOSPITAL_COMMUNITY): Payer: Self-pay

## 2020-10-05 DIAGNOSIS — E785 Hyperlipidemia, unspecified: Secondary | ICD-10-CM | POA: Insufficient documentation

## 2020-10-05 DIAGNOSIS — R0600 Dyspnea, unspecified: Secondary | ICD-10-CM | POA: Diagnosis present

## 2020-10-05 DIAGNOSIS — R0609 Other forms of dyspnea: Secondary | ICD-10-CM

## 2020-10-05 MED ORDER — IOHEXOL 350 MG/ML SOLN
95.0000 mL | Freq: Once | INTRAVENOUS | Status: AC | PRN
  Administered 2020-10-05: 95 mL via INTRAVENOUS

## 2020-10-05 MED ORDER — NITROGLYCERIN 0.4 MG SL SUBL
SUBLINGUAL_TABLET | SUBLINGUAL | Status: AC
  Filled 2020-10-05: qty 2

## 2020-10-05 MED ORDER — NITROGLYCERIN 0.4 MG SL SUBL
0.8000 mg | SUBLINGUAL_TABLET | Freq: Once | SUBLINGUAL | Status: AC
  Administered 2020-10-05: 0.8 mg via SUBLINGUAL

## 2020-10-09 NOTE — Progress Notes (Signed)
Cardiology Office Note:    Date:  10/12/2020   ID:  Ethan Lambert, DOB 03/11/1961, MRN [id number]  PCP:  Maximiano Coss, NP   [facility]  Cardiologist:  Werner Lean, MD  Advanced Practice Provider:  No care team member to display Electrophysiologist:  Thompson Grayer, MD      CC:  Pre-surgery f/u Dr. Victorino December [facility]  History of Present Illness:    Ethan Lambert is a 60 y.o. male with a hx of PAF s/p Ablation and flecainide PIP CHADSVASC 3 on eliquis. HTN who presents for evaluation 09/14/20.  In interim of this visit, patient had CCTA consistent with non-obstructive disease. Seen in follow up 10/12/20.  Patient notes that he is doing well.  Since day prior/last visit notes sprained his finger  changes.  Relevant interval testing or therapy include CCTA without obstructive disease.  There are no interval hospital/ED visit.    No chest pain or pressure.  No SOB/DOE and no PND/Orthopnea.  No weight gain or leg swelling.  Notes occasional palpitations.  Ambulatory blood pressure 147/90 on average (lower here).  Past Medical History:  Diagnosis Date  . Anemia   . Arthritis    "probably right ring finger joint" (05/28/2018)  . Childhood asthma    as child none now  . Clotting disorder ([place])   . CVA (cerebral vascular accident) ([place]) 1999 X 2   LEFT FRONTAL & OCCIPITAL , NON-HEMORRHAGIC; no residual  . Family history of malignant neoplasm of gastrointestinal tract   . Heart murmur   . History of blood transfusion 1999   "related to the stroke"  . History of kidney stones   . HIV disease ([place]) dx 1999   MONITORED BY INFECTIOUS DISEASE -- DR [place]  . Hypertension   . Paroxysmal atrial fibrillation (HCC)    CARDIOLOGIST--  DR Rayann Heman  . Right knee meniscal tear   . Seizures ([place])    1999  . Skin cancer    skin  . Sleep apnea    "didn't follow thru w/mask" (05/28/2018)    Past Surgical History:  Procedure Laterality Date   . APPENDECTOMY  06-09-2003  . ATRIAL FIBRILLATION ABLATION  05/28/2018  . ATRIAL FIBRILLATION ABLATION N/A 05/28/2018   Procedure: ATRIAL FIBRILLATION ABLATION;  Surgeon: Thompson Grayer, MD;  Location: [facility];  Service: Cardiovascular;  Laterality: N/A;  . BACK SURGERY    . INGUINAL HERNIA REPAIR Bilateral 1990s  . KNEE ARTHROSCOPY WITH MEDIAL MENISECTOMY Right 12/31/2013   Procedure: RIGHT KNEE ARTHROSCOPY PARTIAL MEDIAL MENISCECTOMY DEBRIDEMENT AND CHONDROPLASTY ;  Surgeon: Sydnee Cabal, MD;  Location: [facility];  Service: Orthopedics;  Laterality: Right;  . KNEE ARTHROSCOPY WITH MEDIAL MENISECTOMY Left 01/27/2019   Procedure: LEFT KNEE ARTHROSCOPY WITH PARTIAL MEDIAL MENISCECTOMY;  Surgeon: Leandrew Koyanagi, MD;  Location: [facility];  Service: Orthopedics;  Laterality: Left;  . LAPAROSCOPIC CHOLECYSTECTOMY  07-08-2003  . LASER ABLATION ANAL CONDYLOMA  05-18-2001  . LUMBAR LAMINECTOMY/DECOMPRESSION MICRODISCECTOMY Left 03/15/2015   Procedure: Left Lumbar five- sacral one microdiskectomy;  Surgeon: Consuella Lose, MD;  Location: [facility];  Service: Neurosurgery;  Laterality: Left;  Left L5S1 microdiskectomy  . NEGATIVE SLEEP STUDY  01-03-2012  in epic  . SKIN CANCER EXCISION Right    "cut it off my shoulder"  . TRANSTHORACIC ECHOCARDIOGRAM  12-01-2008   MODERATE LVH/  EF 55-60%/  MILD MR/  NEGATIVE BUBBLE STUDY    Current  Medications: Current Meds  Medication Sig  . CARTIA XT 240 MG 24 hr capsule TAKE 1 CAPSULE BY MOUTH  DAILY  . diltiazem (CARDIZEM) 30 MG tablet Take 1 tablet every 4 hours AS NEEDED for afib heart rate >100  . ELIQUIS 5 MG TABS tablet TAKE 1 TABLET BY MOUTH  TWICE DAILY  . flecainide (TAMBOCOR) 50 MG tablet Take 1 tablet (50 mg total) by mouth 2 (two) times daily.  . [DISCONTINUED] rosuvastatin (CRESTOR) 5 MG tablet Take 1 tablet (5 mg total) by mouth daily.     Allergies:   Morphine   Social History   Socioeconomic  History  . Marital status: Married    Spouse name: Not on file  . Number of children: 1  . Years of education: Not on file  . Highest education level: Not on file  Occupational History  . 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 Smoking status: Never Smoker  . Smokeless tobacco: Never Used  Vaping Use  . Vaping Use: Never used  Substance and Sexual Activity  . Alcohol use: Yes    Alcohol/week: 0.0 standard drinks    Comment: 05/28/2018 "rarely; 1-2 per month"  . Drug use: Never  . Sexual activity: Yes    Partners: Male    Comment: offered condoms  Other Topics Concern  . Not on file  Social History Narrative   Pt lives in [place]. 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 There is no history of Rectal cancer or Stomach cancer. History of coronary artery disease notable for two brothers in their six. History of heart failure notable for no membrs. History of arrhythmia notable for father having A fib. Denies family history of sudden cardiac death including drowning, car accidents, or unexplained deaths in the family. No history of bicuspid aortic valve or aortic aneurysm or dissection.   ROS:   Please see the history of present illness.     All other systems reviewed and are negative.  EKGs/Labs/Other Studies Reviewed:    The following studies were reviewed today:  EKG:   08/23/20:  SR 64 1st AV block  Transthoracic Echocardiogram: Date: 9/20/217 Results: Study Conclusions   - Left ventricle: The cavity size was normal. Wall thickness was  increased in a pattern of mild LVH. Systolic function was normal.  The estimated ejection fraction was in the range of 55% to 60%.  Wall motion was normal; there were no regional wall motion  abnormalities. Left ventricular diastolic function parameters  were normal.  - Aortic valve: There was no stenosis. There was trivial  regurgitation.  - Mitral valve: There was no significant regurgitation.  - Right ventricle: The cavity size was normal. Systolic function  was normal.  - Right atrium: The atrium was mildly dilated.  - Pulmonary arteries: PA peak pressure: 28 mm Hg (S).  - Systemic veins: IVC measured 2.5 cm with > 50% respirophasic  variation,  suggesting RA pressure 8 mmHg.   Echo done today:  Mild AI normal LV function; Aortic Valve calcification   Cardiac CT PV: Date: 2019 Results: IMPRESSION: 1. There is normal pulmonary vein drainage into the left atrium.  2. The left atrial appendage is large with chicken wing morphology and no thrombus.  3. The esophagus runs in the left atrial midline and is not in the proximity to any of the pulmonary veins.  4. Calcium score was 175 that represents 83 percentile for age/sex.   Cardiac CT : Date: 10/05/20 Results: IMPRESSION: 1. Coronary calcium score of 301. This was 86th percentile for age, sex, and race matched control.  2. Normal coronary origin with Right dominance.  3. CAD-RADS 2. Mild non-obstructive CAD (25-49%) worst in the Ramus Intermedius (small vessel). Consider non-atherosclerotic causes of chest pain. Consider preventive therapy and risk factor modification.  4. 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  Coronary sinus dilation: 14 mm  IMPRESSION: 1.  Aortic  Atherosclerosis  ECG or NM Stress Testing : Date: 09/14/2020 Results:   Nuclear stress EF: 60%.  No T wave inversion was noted during stress.  There was no ST segment deviation noted during stress.  Defect 1: There is a medium defect of moderate severity present in the apical septal, apical lateral and apex location.  This is a low risk study.   Medium size, moderate intensity fixed apical, apical septal and apical lateral perfusion defect, suspicious for artifact. No significant reversible ischemia. LVEF 60% with normal wall motion. This is a low risk study. No prior for comparison.   Recent Labs: 08/15/2020: ALT 29; BUN 19; Creat 1.05; Hemoglobin 14.7; Platelets 223; Potassium 4.5; Sodium 139  Recent Lipid Panel    Component Value Date/Time   CHOL 191 08/15/2020 0920   TRIG 315 (H) 08/15/2020 0920   TRIG 369 02/16/2010 0000   HDL 31 (L) 08/15/2020 0920   CHOLHDL 6.2 (H) 08/15/2020 0920   VLDL 36 (H) 11/27/2015 0915   LDLCALC 115 (H) 08/15/2020 0920    Risk Assessment/Calculations:    CHA2DS2-VASc Score = 3  This indicates a 3.2% annual risk of stroke. The patient's score is based upon: CHF History: No HTN History: Yes Diabetes History: No Stroke History: Yes Vascular Disease History: No Age Score: 0 Gender Score: 0      Physical Exam:    VS:  BP 132/80   Pulse 64   Ht 6\' 1"  (1.854 m)   Wt 272 lb 12.8 oz (123.7 kg)   SpO2 97%   BMI 35.99 kg/m     Wt Readings from Last 3 Encounters:  10/12/20 272 lb 12.8 oz (123.7 kg)  09/14/20 280 lb 12.8 oz (127.4 kg)  09/01/20 277 lb (125.6 kg)    GEN: Obese, well developed in no acute distress HEENT: Bilateral Frank's Sign NECK: No JVD; No carotid bruits LYMPHATICS: No lymphadenopathy CARDIAC: RRR, no murmurs, rubs, gallops RESPIRATORY:  Clear to auscultation without rales, wheezing or rhonchi  ABDOMEN: Soft, non-tender, non-distended MUSCULOSKELETAL:  No edema; No deformity  SKIN: Warm and dry NEUROLOGIC:   Alert and oriented x 3 PSYCHIATRIC:  Normal affect   ASSESSMENT:    1. Morbid obesity (HCC)   2. Paroxysmal atrial fibrillation ([place])   3. Essential hypertension   4. Hyperlipidemia, unspecified hyperlipidemia type   5. HIV infection, unspecified symptom status ([place])   6. Preoperative cardiovascular examination    PLAN:    In order of  problems listed above:  Morbid Obesity with HTN and HLD (LDL goal < 70) HIV on HAART Presurgery shoulder surgery PAF on Flecainide and Eliquis - will continue flecainide - will continue rosuvastatin 5mg  PO  - will continue ambulatory BP monitoring; patient will bring cuff to next visit. Preoperative Risk Assessment - The Revised Cardiac Risk Index = 0 (high risk surgery (intraperitoneal, intrathoracic, or suprainguinal vascular), CAD, CHF, CVA, DM on insulin, Scr >2), which equates to very low risk estimated risk of perioperative myocardial infarction, pulmonary edema, ventricular fibrillation, cardiac arrest, or complete heart block.  - 5 functional mets - No further cardiac testing is recommended prior to surgery.  - The patient may proceed to surgery at acceptable risk.   - can come off of AC for surgery; if AF RVR will be happy to see in consultation    Fall-Winter follow up unless new symptoms or abnormal test results warranting change in plan  Would be reasonable for  APP Follow up         Medication Adjustments/Labs and Tests Ordered: Current medicines are reviewed at length with the patient today.  Concerns regarding medicines are outlined above.  No orders of the defined types were placed in this encounter.  Meds ordered this encounter  Medications  . rosuvastatin (CRESTOR) 5 MG tablet    Sig: Take 1 tablet (5 mg total) by mouth daily.    Dispense:  90 tablet    Refill:  3    Patient Instructions  Medication Instructions:  Your physician recommends that you continue on your current medications as directed. Please refer to  the Current Medication list given to you today. Refilled rosuvastatin   *If you need a refill on your cardiac medications before your next appointment, please call your pharmacy*   Lab Work: NONE If you have labs (blood work) drawn today and your tests are completely normal, you will receive your results only by: [name] MyChart Message (if you have MyChart) OR . A paper copy in the mail If you have any lab test that is abnormal or we need to change your treatment, we will call you to review the results.   Testing/Procedures: NONE   Follow-Up: At [facility], you and your health needs are our priority.  As part of our continuing mission to provide you with exceptional heart care, we have created designated Provider Care Teams.  These Care Teams include your primary Cardiologist (physician) and Advanced Practice Providers (APPs -  Physician Assistants and Nurse Practitioners) who all work together to provide you with the care you need, when you need it.  We recommend signing up for the patient portal called "MyChart".  Sign up information is provided on this After Visit Summary.  MyChart is used to connect with patients for Virtual Visits (Telemedicine).  Patients are able to view lab/test results, encounter notes, upcoming appointments, etc.  Non-urgent messages can be sent to your provider as well.   To learn more about what you can do with MyChart, go to [email or web address].    Your next appointment:   8-9 month(s)  The format for your next appointment:   In Person  Provider:   You may see Werner Lean, MD or one of the following Advanced Practice Providers on your designated Care Team:    Melina Copa, PA-C  Ermalinda Barrios, PA-C    Other Instructions  Please bring BP cuff to next appointment  You are cleared to have surgery.  Signed, Werner Lean, MD  10/12/2020 11:47 AM    [place]

## 2020-10-12 ENCOUNTER — Encounter: Payer: Self-pay | Admitting: Internal Medicine

## 2020-10-12 ENCOUNTER — Ambulatory Visit: Payer: 59 | Admitting: Internal Medicine

## 2020-10-12 ENCOUNTER — Other Ambulatory Visit: Payer: Self-pay

## 2020-10-12 DIAGNOSIS — E785 Hyperlipidemia, unspecified: Secondary | ICD-10-CM

## 2020-10-12 DIAGNOSIS — I480 Paroxysmal atrial fibrillation: Secondary | ICD-10-CM

## 2020-10-12 DIAGNOSIS — I10 Essential (primary) hypertension: Secondary | ICD-10-CM | POA: Diagnosis not present

## 2020-10-12 DIAGNOSIS — Z01810 Encounter for preprocedural cardiovascular examination: Secondary | ICD-10-CM

## 2020-10-12 DIAGNOSIS — B20 Human immunodeficiency virus [HIV] disease: Secondary | ICD-10-CM

## 2020-10-12 MED ORDER — ROSUVASTATIN CALCIUM 5 MG PO TABS: 5.0000 mg | ORAL_TABLET | Freq: Every day | ORAL | 3 refills | Status: DC

## 2020-10-12 NOTE — Patient Instructions (Signed)
Medication Instructions:  Your physician recommends that you continue on your current medications as directed. Please refer to the Current Medication list given to you today. Refilled rosuvastatin   *If you need a refill on your cardiac medications before your next appointment, please call your pharmacy*   Lab Work: NONE If you have labs (blood work) drawn today and your tests are completely normal, you will receive your results only by: [name] MyChart Message (if you have MyChart) OR . A paper copy in the mail If you have any lab test that is abnormal or we need to change your treatment, we will call you to review the results.   Testing/Procedures: NONE   Follow-Up: At [facility], you and your health needs are our priority.  As part of our continuing mission to provide you with exceptional heart care, we have created designated Provider Care Teams.  These Care Teams include your primary Cardiologist (physician) and Advanced Practice Providers (APPs -  Physician Assistants and Nurse Practitioners) who all work together to provide you with the care you need, when you need it.  We recommend signing up for the patient portal called "MyChart".  Sign up information is provided on this After Visit Summary.  MyChart is used to connect with patients for Virtual Visits (Telemedicine).  Patients are able to view lab/test results, encounter notes, upcoming appointments, etc.  Non-urgent messages can be sent to your provider as well.   To learn more about what you can do with MyChart, go to [email or web address].    Your next appointment:   8-9 month(s)  The format for your next appointment:   In Person  Provider:   You may see Werner Lean, MD or one of the following Advanced Practice Providers on your designated Care Team:    Melina Copa, PA-C  Ermalinda Barrios, PA-C    Other Instructions  Please bring BP cuff to next appointment  You are cleared to have surgery.

## 2020-11-17 ENCOUNTER — Encounter: Payer: Self-pay | Admitting: Registered Nurse

## 2020-11-17 ENCOUNTER — Other Ambulatory Visit: Payer: Self-pay

## 2020-11-17 ENCOUNTER — Ambulatory Visit: Payer: 59 | Admitting: Registered Nurse

## 2020-11-17 VITALS — BP 133/84 | HR 60 | Temp 98.0°F | Resp 18 | Ht 73.0 in | Wt 277.2 lb

## 2020-11-17 DIAGNOSIS — R202 Paresthesia of skin: Secondary | ICD-10-CM

## 2020-11-17 DIAGNOSIS — R200 Anesthesia of skin: Secondary | ICD-10-CM

## 2020-11-17 NOTE — Patient Instructions (Signed)
° ° ° °  If you have lab work done today you will be contacted with your lab results within the next 2 weeks.  If you have not heard from us then please contact us. The fastest way to get your results is to register for My Chart. ° ° °IF you received an x-ray today, you will receive an invoice from [facility]. Please contact [facility] at [phone] with questions or concerns regarding your invoice.  ° °IF you received labwork today, you will receive an invoice from [facility]. Please contact [facility] at [phone] with questions or concerns regarding your invoice.  ° °Our billing staff will not be able to assist you with questions regarding bills from these companies. ° °You will be contacted with the lab results as soon as they are available. The fastest way to get your results is to activate your My Chart account. Instructions are located on the last page of this paperwork. If you have not heard from us regarding the results in 2 weeks, please contact this office. °  ° ° ° °

## 2020-11-17 NOTE — Progress Notes (Signed)
Established Patient Office Visit  Subjective:  Patient ID: Ethan Lambert, male    DOB: 1960-11-29  Age: 60 y.o. MRN: [id number]  CC:  Chief Complaint  Patient presents with  . Hospitalization Follow-up    Patient states he is here for a hospital follow up. Patient states he had surgery on right shoulder and his tongue has been numb and tingling since last week. He called the surgeon and was told he wouldn't have that effect from the surgery    HPI [name] presents for hfu  r rotator cuff repair on 11/10/20 Tolerated well, has started PT and home exercises  FMLA paperwork given absence since late 2021, however, he has had issues with the county (his employer) trying to extend this. He unfortunately will be terminated from his job soon.  Notes that when given nerve block prior to surgery, he experienced heaviness in chest and shortness of breath. This lasted 3-4 days after surgery but has since resolved. Of note, he did get preop clearance through cardiology.   He also notes tongue tingling mostly in an area around the tip of the tongue extending posteriorly since surgery. Stable without improvement or worsening. No disturbance to taste or smell. No slurred speech or facial paralysis. No other neuro symptoms. Has not happened before. No lateralization of symptoms. He was under general anesthesia during his surgery.   Past Medical History:  Diagnosis Date  . Anemia   . Arthritis    "probably right ring finger joint" (05/28/2018)  . Childhood asthma    as child none now  . Clotting disorder ([place])   . CVA (cerebral vascular accident) ([place]) 1999 X 2   LEFT FRONTAL & OCCIPITAL , NON-HEMORRHAGIC; no residual  . Family history of malignant neoplasm of gastrointestinal tract   . Heart murmur   . History of blood transfusion 1999   "related to the stroke"  . History of kidney stones   . HIV disease ([place]) dx 1999   MONITORED BY INFECTIOUS DISEASE -- DR [place]  .  Hypertension   . Paroxysmal atrial fibrillation (HCC)    CARDIOLOGIST--  DR Rayann Heman  . Right knee meniscal tear   . Seizures ([place])    1999  . Skin cancer    skin  . Sleep apnea    "didn't follow thru w/mask" (05/28/2018)    Past Surgical History:  Procedure Laterality Date  . APPENDECTOMY  06-09-2003  . ATRIAL FIBRILLATION ABLATION  05/28/2018  . ATRIAL FIBRILLATION ABLATION N/A 05/28/2018   Procedure: ATRIAL FIBRILLATION ABLATION;  Surgeon: Thompson Grayer, MD;  Location: [facility];  Service: Cardiovascular;  Laterality: N/A;  . BACK SURGERY    . INGUINAL HERNIA REPAIR Bilateral 1990s  . KNEE ARTHROSCOPY WITH MEDIAL MENISECTOMY Right 12/31/2013   Procedure: RIGHT KNEE ARTHROSCOPY PARTIAL MEDIAL MENISCECTOMY DEBRIDEMENT AND CHONDROPLASTY ;  Surgeon: Sydnee Cabal, MD;  Location: [facility];  Service: Orthopedics;  Laterality: Right;  . KNEE ARTHROSCOPY WITH MEDIAL MENISECTOMY Left 01/27/2019   Procedure: LEFT KNEE ARTHROSCOPY WITH PARTIAL MEDIAL MENISCECTOMY;  Surgeon: Leandrew Koyanagi, MD;  Location: [facility];  Service: Orthopedics;  Laterality: Left;  . LAPAROSCOPIC CHOLECYSTECTOMY  07-08-2003  . LASER ABLATION ANAL CONDYLOMA  05-18-2001  . LUMBAR LAMINECTOMY/DECOMPRESSION MICRODISCECTOMY Left 03/15/2015   Procedure: Left Lumbar five- sacral one microdiskectomy;  Surgeon: Consuella Lose, MD;  Location: [facility];  Service: Neurosurgery;  Laterality: Left;  Left L5S1 microdiskectomy  . NEGATIVE SLEEP STUDY  01-03-2012  in epic  . SKIN CANCER EXCISION Right    "cut it off my shoulder"  . TRANSTHORACIC ECHOCARDIOGRAM  12-01-2008   MODERATE LVH/  EF 55-60%/  MILD MR/  NEGATIVE BUBBLE STUDY    Family History  Problem Relation Age of Onset  . Arrhythmia Father   . Melanoma Father   . Heart attack Brother   . Prostate cancer Maternal Grandfather   . Colon cancer Maternal Grandfather   . Lung cancer Mother        smoker  . Hypertension Mother    . Hyperlipidemia Mother   . Other Maternal Grandmother        harding of the arteries  . Rectal cancer Neg Hx   . Stomach cancer Neg Hx     Social History   Socioeconomic History  . Marital status: Married    Spouse name: Not on file  . Number of children: 1  . Years of education: Not on file  . Highest education level: Not on file  Occupational History  . 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 Smoking status: Never Smoker  . Smokeless tobacco: Never Used  Vaping Use  . Vaping Use: Never used  Substance and Sexual Activity  . Alcohol use: Yes    Alcohol/week: 0.0 standard drinks    Comment: 05/28/2018 "rarely; 1-2 per month"  . Drug use: Never  . Sexual activity: Yes    Partners: Male    Comment: offered condoms  Other Topics Concern  . Not on file  Social History Narrative   Pt lives in [place]. 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 CARTIA XT 240 MG 24 hr capsule TAKE 1 CAPSULE BY MOUTH  DAILY 90 capsule 1  . diltiazem (CARDIZEM) 30 MG tablet Take 1 tablet every 4 hours AS NEEDED for afib heart rate >100 30 tablet 1  . ELIQUIS 5 MG TABS tablet TAKE 1 TABLET BY MOUTH  TWICE DAILY 180 tablet 1  . flecainide (TAMBOCOR) 50 MG tablet Take 1 tablet (50 mg total) by mouth 2 (two) times daily. 30 tablet 0  . rosuvastatin (CRESTOR) 5 MG tablet Take 1 tablet (5 mg total) by mouth daily. 90 tablet 3   No facility-administered medications prior to visit.    Allergies  Allergen Reactions  . Morphine Other (See Comments)    hallucinations    ROS Review of Systems  Constitutional:  Negative.   HENT: Negative.   Eyes: Negative.   Respiratory: Negative.   Cardiovascular: Negative.   Gastrointestinal: Negative.   Genitourinary: Negative.   Musculoskeletal: Negative.   Skin: Negative.   Neurological: Negative.   Psychiatric/Behavioral: Negative.   All other systems reviewed and are negative.     Objective:    Physical Exam Constitutional:      General: He is not in acute distress.    Appearance: Normal appearance. He is normal weight. He is not ill-appearing, toxic-appearing or diaphoretic.  Eyes:     General: No visual field deficit. Cardiovascular:     Rate and Rhythm: Normal rate and regular rhythm.  Heart sounds: Normal heart sounds. No murmur heard. No friction rub. No gallop.   Pulmonary:     Effort: Pulmonary effort is normal. No respiratory distress.     Breath sounds: Normal breath sounds. No stridor. No wheezing, rhonchi or rales.  Chest:     Chest wall: No tenderness.  Neurological:     General: No focal deficit present.     Mental Status: He is alert and oriented to person, place, and time. Mental status is at baseline.     GCS: GCS eye subscore is 4. GCS verbal subscore is 5. GCS motor subscore is 6.     Cranial Nerves: Cranial nerves are intact. No cranial nerve deficit, dysarthria or facial asymmetry.     Sensory: Sensation is intact. No sensory deficit.     Motor: Motor function is intact.     Coordination: Coordination is intact.     Gait: Gait is intact.  Psychiatric:        Mood and Affect: Mood normal.        Behavior: Behavior normal.        Thought Content: Thought content normal.        Judgment: Judgment normal.     BP 133/84   Pulse 60   Temp 98 F (36.7 C) (Temporal)   Resp 18   Ht 6\' 1"  (1.854 m)   Wt 277 lb 3.2 oz (125.7 kg)   SpO2 99%   BMI 36.57 kg/m  Wt Readings from Last 3 Encounters:  11/17/20 277 lb 3.2 oz (125.7 kg)  10/12/20 272 lb 12.8 oz (123.7 kg)  09/14/20 280 lb 12.8 oz (127.4 kg)      There are no preventive care reminders to display for this patient.  There are no preventive care reminders to display for this patient.   Lab Results  Component Value Date   WBC 5.9 08/15/2020   HGB 14.7 08/15/2020   HCT 42.8 08/15/2020   MCV 91.8 08/15/2020   PLT 223 08/15/2020   Lab Results  Component Value Date   NA 139 08/15/2020   K 4.5 08/15/2020   CO2 28 08/15/2020   GLUCOSE 148 (H) 08/15/2020   BUN 19 08/15/2020   CREATININE 1.05 08/15/2020   BILITOT 0.9 08/15/2020   ALKPHOS 85 01/29/2017   AST 24 08/15/2020   ALT 29 08/15/2020   PROT 7.4 08/15/2020   ALBUMIN 4.4 01/29/2017   CALCIUM 9.6 08/15/2020   ANIONGAP 9 01/25/2019   GFR 96.29 12/22/2013   Lab Results  Component Value Date   CHOL 191 08/15/2020   Lab Results  Component Value Date   HDL 31 (L) 08/15/2020   Lab Results  Component Value Date   LDLCALC 115 (H) 08/15/2020   Lab Results  Component Value Date   TRIG 315 (H) 08/15/2020   Lab Results  Component Value Date   CHOLHDL 6.2 (H) 08/15/2020   No results found for: HGBA1C    Assessment & Plan:   Problem List Items Addressed This Visit   None   Visit Diagnoses    Numbness of tongue    -  Primary   Numbness or tingling          No orders of the defined types were placed in this encounter.   Follow-up: No follow-ups on file.   PLAN  Suspect lingual nerve injury with LMA use during surgery. Surgical notes not available to me at this time, unfortunately, so unsure if there was any evidence  of LMA trauma  Exam and vitals reassuring in regards to CVA. No lateralization or peripheral symptoms reassuring. No apparent further trigeminal involvement.   Discussed with patient that he'd likely see resolution in a week or so but will refer to ENT as back up plan  FMLA paperwork filled out with plan for patient to be absent through end of August due to R rotator cuff repair. Pt has upcoming c spine procedures as well, will likely  need to miss more time. Given impending termination, we did briefly discuss moving into an area of work where physical limitations would not preclude as many job duties.  Patient encouraged to call clinic with any questions, comments, or concerns.   Maximiano Coss, NP

## 2020-12-04 ENCOUNTER — Other Ambulatory Visit: Payer: Self-pay

## 2020-12-04 MED ORDER — DILTIAZEM HCL ER COATED BEADS 240 MG PO CP24: 240.0000 mg | ORAL_CAPSULE | Freq: Every day | ORAL | 1 refills | Status: DC

## 2020-12-04 NOTE — Telephone Encounter (Signed)
This is a A-Fib clinic pt 

## 2020-12-11 ENCOUNTER — Other Ambulatory Visit: Payer: Self-pay

## 2020-12-11 ENCOUNTER — Other Ambulatory Visit (HOSPITAL_COMMUNITY): Payer: Self-pay | Admitting: Nurse Practitioner

## 2020-12-11 DIAGNOSIS — E785 Hyperlipidemia, unspecified: Secondary | ICD-10-CM

## 2020-12-11 LAB — HEPATIC FUNCTION PANEL
ALT: 36 IU/L (ref 0–44)
AST: 33 IU/L (ref 0–40)
Albumin: 4.3 g/dL (ref 3.8–4.9)
Alkaline Phosphatase: 81 IU/L (ref 44–121)
Bilirubin Total: 0.6 mg/dL (ref 0.0–1.2)
Bilirubin, Direct: 0.15 mg/dL (ref 0.00–0.40)
Total Protein: 7.3 g/dL (ref 6.0–8.5)

## 2020-12-11 LAB — LIPID PANEL
Chol/HDL Ratio: 5.6 ratio — ABNORMAL HIGH (ref 0.0–5.0)
Cholesterol, Total: 158 mg/dL (ref 100–199)
HDL: 28 mg/dL — ABNORMAL LOW (ref >39)
LDL Chol Calc (NIH): 76 mg/dL (ref 0–99)
Triglycerides: 337 mg/dL — ABNORMAL HIGH (ref 0–149)
VLDL Cholesterol Cal: 54 mg/dL — ABNORMAL HIGH (ref 5–40)

## 2020-12-12 ENCOUNTER — Telehealth: Payer: Self-pay

## 2020-12-12 ENCOUNTER — Other Ambulatory Visit: Payer: Self-pay | Admitting: Pharmacist

## 2020-12-12 DIAGNOSIS — E785 Hyperlipidemia, unspecified: Secondary | ICD-10-CM

## 2020-12-12 MED ORDER — ROSUVASTATIN CALCIUM 20 MG PO TABS: 20.0000 mg | ORAL_TABLET | Freq: Every day | ORAL | 3 refills | Status: DC

## 2020-12-12 NOTE — Telephone Encounter (Signed)
Patient notified of results and Pharmacist recommendations.  He is agreeable to plan of care.  Follow up lab appointment scheduled for 02/12/21. All questions were answered.

## 2020-12-12 NOTE — Telephone Encounter (Signed)
-----   Message from Freada Bergeron, MD sent at 12/12/2020  8:36 AM EDT ----- His LFTs look great and normal. His triglycerides are still a little high. Megan or Melissa, do we have the option for vascepa or increasing his crestor? I think he is on HAART therapy for HIV.

## 2020-12-13 ENCOUNTER — Other Ambulatory Visit: Payer: Self-pay | Admitting: Internal Medicine

## 2020-12-14 NOTE — Telephone Encounter (Signed)
Eliquis 5mg  refill request received. Patient is 60 years old, weight-125.7kg, Crea-1.05 on 08/15/2020, Diagnosis-Afib, and last seen by Dr. Gasper Sells on 10/12/2020. Dose is appropriate based on dosing criteria. Will send in refill to requested pharmacy.

## 2020-12-20 ENCOUNTER — Ambulatory Visit (INDEPENDENT_AMBULATORY_CARE_PROVIDER_SITE_OTHER): Payer: 59 | Admitting: Otolaryngology

## 2021-01-03 ENCOUNTER — Ambulatory Visit: Payer: Self-pay

## 2021-01-03 ENCOUNTER — Telehealth: Payer: Self-pay

## 2021-01-03 ENCOUNTER — Other Ambulatory Visit (HOSPITAL_COMMUNITY): Payer: Self-pay

## 2021-01-03 ENCOUNTER — Other Ambulatory Visit: Payer: Self-pay

## 2021-01-03 NOTE — Telephone Encounter (Signed)
[facility] Patient Advocate Encounter   Received notification from [place] that prior authorization for Triumeq is required.(Not on Formulary)   PA submitted on 01/03/21 Key [id number] Status is pending    [facility] will continue to follow.   Ileene Patrick, [facility] Patient [facility] for Infectious Disease Phone: [phone] Fax:  [phone]

## 2021-01-04 ENCOUNTER — Telehealth: Payer: Self-pay

## 2021-01-04 ENCOUNTER — Other Ambulatory Visit (HOSPITAL_COMMUNITY): Payer: Self-pay

## 2021-01-04 NOTE — Telephone Encounter (Signed)
[facility] Patient Advocate Encounter  Prior Authorization for Perlie Gold has been approved.    PA# [id number] Effective dates: 01/03/21 through 01/02/22  Patients co-pay is $1310.49.   [facility] will continue to follow.  Ileene Patrick, [facility] Patient [facility] for Infectious Disease Phone: [phone] Fax:  [phone]

## 2021-01-07 ENCOUNTER — Other Ambulatory Visit: Payer: Self-pay | Admitting: Internal Medicine

## 2021-02-12 ENCOUNTER — Other Ambulatory Visit: Payer: Self-pay

## 2021-02-12 ENCOUNTER — Other Ambulatory Visit: Payer: 59 | Admitting: *Deleted

## 2021-02-12 DIAGNOSIS — E785 Hyperlipidemia, unspecified: Secondary | ICD-10-CM

## 2021-02-12 LAB — LIPID PANEL
Chol/HDL Ratio: 4.4 ratio (ref 0.0–5.0)
Cholesterol, Total: 132 mg/dL (ref 100–199)
HDL: 30 mg/dL — ABNORMAL LOW (ref >39)
LDL Chol Calc (NIH): 68 mg/dL (ref 0–99)
Triglycerides: 207 mg/dL — ABNORMAL HIGH (ref 0–149)
VLDL Cholesterol Cal: 34 mg/dL (ref 5–40)

## 2021-02-12 LAB — HEPATIC FUNCTION PANEL
ALT: 43 IU/L (ref 0–44)
AST: 44 IU/L — ABNORMAL HIGH (ref 0–40)
Albumin: 4.4 g/dL (ref 3.8–4.9)
Alkaline Phosphatase: 77 IU/L (ref 44–121)
Bilirubin Total: 0.7 mg/dL (ref 0.0–1.2)
Bilirubin, Direct: 0.17 mg/dL (ref 0.00–0.40)
Total Protein: 7.1 g/dL (ref 6.0–8.5)

## 2021-02-19 ENCOUNTER — Telehealth: Payer: Self-pay | Admitting: Pharmacist

## 2021-02-19 NOTE — Telephone Encounter (Signed)
Dr. Gasper Sells requested we start pt on Vascepa. PA submitted.

## 2021-02-20 NOTE — Telephone Encounter (Signed)
Insurance faxed asking if patient has ever tried Lovaza. Faxed back reason why this medication is not ideal

## 2021-02-22 NOTE — Telephone Encounter (Signed)
Vascepa was denied. Per insurance patient must try Lovaza plus 2 other formulary options.

## 2021-03-22 ENCOUNTER — Telehealth: Payer: Self-pay

## 2021-03-22 NOTE — Telephone Encounter (Signed)
-----   Message from Ramond Dial, [place] sent at 03/22/2021  1:12 PM EDT ----- Please call [employer] and follow up on the appeals for vascepa. thanks

## 2021-03-22 NOTE — Telephone Encounter (Signed)
Called and spoke to a pa rep for the vascepa and they stated that they were missing a few questions so I answered them. And now its waiting determination

## 2021-03-26 ENCOUNTER — Other Ambulatory Visit: Payer: Self-pay | Admitting: Internal Medicine

## 2021-04-17 NOTE — Telephone Encounter (Signed)
Called and spoke to representative and they stated that it was denied they have to have tried at least 3 formulary alternatives.Ethan Lambert crestor only from their standpoint. Will route Ethan Lambert rph to make her aware

## 2021-05-10 MED ORDER — OMEGA-3-ACID ETHYL ESTERS 1 G PO CAPS: 2.0000 g | ORAL_CAPSULE | Freq: Two times a day (BID) | ORAL | 3 refills | Status: DC

## 2021-05-10 NOTE — Telephone Encounter (Signed)
Called pt to let him know insurance wouldn't cover Vascepa but Lovaza 2g BID sent in. He states that his legs have been "killing him" since starting on rosuvastatin 20mg . I advised he decrease to 10mg  daily after a drug holiday.  He was transferred to scheduler line to schedule f/u with Dr. Gasper Sells or APP

## 2021-05-10 NOTE — Addendum Note (Signed)
Addended by: Marcelle Overlie D on: 05/10/2021 04:40 PM   Modules accepted: Orders

## 2021-05-28 ENCOUNTER — Other Ambulatory Visit: Payer: Self-pay

## 2021-05-28 ENCOUNTER — Ambulatory Visit: Payer: 59 | Admitting: Internal Medicine

## 2021-05-28 ENCOUNTER — Encounter: Payer: Self-pay | Admitting: Internal Medicine

## 2021-05-28 VITALS — BP 142/84 | HR 70 | Ht 73.0 in | Wt 292.0 lb

## 2021-05-28 DIAGNOSIS — G4733 Obstructive sleep apnea (adult) (pediatric): Secondary | ICD-10-CM | POA: Diagnosis not present

## 2021-05-28 DIAGNOSIS — I480 Paroxysmal atrial fibrillation: Secondary | ICD-10-CM | POA: Diagnosis not present

## 2021-05-28 DIAGNOSIS — B20 Human immunodeficiency virus [HIV] disease: Secondary | ICD-10-CM

## 2021-05-28 NOTE — Progress Notes (Signed)
PCP: Maximiano Coss, NP Primary Cardiologist: Dr Gasper Sells Primary EP: Dr Rayann Heman  Ethan Lambert is a 60 y.o. male who presents today for routine electrophysiology followup.  Since last being seen in our clinic, the patient reports doing very well.  Today, he denies symptoms of palpitations, chest pain, shortness of breath,  lower extremity edema, dizziness, presyncope, or syncope.  The patient is otherwise without complaint today.   Past Medical History:  Diagnosis Date   Anemia    Arthritis    "probably right ring finger joint" (05/28/2018)   Childhood asthma    as child none now   Clotting disorder ([place])    CVA (cerebral vascular accident) ([place]) 1999 X 2   LEFT FRONTAL & OCCIPITAL , NON-HEMORRHAGIC; no residual   Family history of malignant neoplasm of gastrointestinal tract    Heart murmur    History of blood transfusion 1999   "related to the stroke"   History of kidney stones    HIV disease ([place]) dx 1999   MONITORED BY INFECTIOUS DISEASE -- DR [place]   Hypertension    Paroxysmal atrial fibrillation ([place])    CARDIOLOGIST--  DR Rayann Heman   Right knee meniscal tear    Seizures ([place])    1999   Skin cancer    skin   Sleep apnea    "didn't follow thru w/mask" (05/28/2018)   Past Surgical History:  Procedure Laterality Date   APPENDECTOMY  06-09-2003   ATRIAL FIBRILLATION ABLATION  05/28/2018   ATRIAL FIBRILLATION ABLATION N/A 05/28/2018   Procedure: ATRIAL FIBRILLATION ABLATION;  Surgeon: Thompson Grayer, MD;  Location: [facility] [facility];  Service: Cardiovascular;  Laterality: N/A;   BACK SURGERY     INGUINAL HERNIA REPAIR Bilateral 1990s   KNEE ARTHROSCOPY WITH MEDIAL MENISECTOMY Right 12/31/2013   Procedure: RIGHT KNEE ARTHROSCOPY PARTIAL MEDIAL MENISCECTOMY DEBRIDEMENT AND CHONDROPLASTY ;  Surgeon: Sydnee Cabal, MD;  Location: [facility];  Service: Orthopedics;  Laterality: Right;   KNEE ARTHROSCOPY WITH MEDIAL MENISECTOMY Left 01/27/2019    Procedure: LEFT KNEE ARTHROSCOPY WITH PARTIAL MEDIAL MENISCECTOMY;  Surgeon: Leandrew Koyanagi, MD;  Location: [facility];  Service: Orthopedics;  Laterality: Left;   LAPAROSCOPIC CHOLECYSTECTOMY  07-08-2003   LASER ABLATION ANAL CONDYLOMA  05-18-2001   LUMBAR LAMINECTOMY/DECOMPRESSION MICRODISCECTOMY Left 03/15/2015   Procedure: Left Lumbar five- sacral one microdiskectomy;  Surgeon: Consuella Lose, MD;  Location: [place] [facility];  Service: Neurosurgery;  Laterality: Left;  Left L5S1 microdiskectomy   NEGATIVE SLEEP STUDY  01-03-2012  in epic   SKIN CANCER EXCISION Right    "cut it off my shoulder"   TRANSTHORACIC ECHOCARDIOGRAM  12-01-2008   MODERATE LVH/  EF 55-60%/  MILD MR/  NEGATIVE BUBBLE STUDY    ROS- all systems are reviewed and negatives except as per HPI above  Current Outpatient Medications  Medication Sig Dispense Refill   apixaban (ELIQUIS) 5 MG TABS tablet TAKE 1 TABLET BY MOUTH  TWICE DAILY 180 tablet 2   diltiazem (CARDIZEM) 30 MG tablet Take 1 tablet every 4 hours AS NEEDED for afib heart rate >100 30 tablet 1   diltiazem (CARTIA XT) 240 MG 24 hr capsule Take 1 capsule (240 mg total) by mouth daily. 90 capsule 1   flecainide (TAMBOCOR) 50 MG tablet TAKE 1 TABLET BY MOUTH TWICE A DAY 60 tablet 6   rosuvastatin (CRESTOR) 20 MG tablet Take 0.5 tablets (10 mg total) by mouth daily. 45 tablet 3   TRIUMEQ  600-50-300 MG tablet TAKE 1 TABLET BY MOUTH EVERY DAY 30 tablet 3   No current facility-administered medications for this visit.    Physical Exam: Vitals:   05/28/21 1557  BP: (!) 142/84  Pulse: 70  SpO2: 94%  Weight: 292 lb (132.5 kg)  Height: 6\' 1"  (1.854 m)    GEN- The patient is well appearing, alert and oriented x 3 today.   Head- normocephalic, atraumatic Eyes-  Sclera clear, conjunctiva pink Ears- hearing intact Oropharynx- clear Lungs- Clear to ausculation bilaterally, normal work of breathing Heart- Regular rate and rhythm, no murmurs, rubs or  gallops, PMI not laterally displaced GI- soft, NT, ND, + BS Extremities- no clubbing, cyanosis, or edema  Wt Readings from Last 3 Encounters:  05/28/21 292 lb (132.5 kg)  11/17/20 277 lb 3.2 oz (125.7 kg)  10/12/20 272 lb 12.8 oz (123.7 kg)    EKG tracing ordered today is personally reviewed and shows sinus, PR 204 msec, LAD  Assessment and Plan:  Paroxysmal atrial fibrillation Doing well post ablation He remains on flecainide Continues to have rare and short episodes.  Does not wish to consider repeat ablation currently Continue eliquis  2. HTN Stable No change required today  3. Obesity Body mass index is 38.52 kg/m. Lifestyle modification advised  Risks, benefits and potential toxicities for medications prescribed and/or refilled reviewed with patient today.   Return to see AF clinic team in 6 months  Thompson Grayer MD, [facility] 05/28/2021 4:07 PM

## 2021-05-28 NOTE — Patient Instructions (Addendum)
Medication Instructions:  Your physician recommends that you continue on your current medications as directed. Please refer to the Current Medication list given to you today. *If you need a refill on your cardiac medications before your next appointment, please call your pharmacy*  Lab Work: None. If you have labs (blood work) drawn today and your tests are completely normal, you will receive your results only by: [place] (if you have MyChart) OR A paper copy in the mail If you have any lab test that is abnormal or we need to change your treatment, we will call you to review the results.  Testing/Procedures: None.  Follow-Up: At [facility], you and your health needs are our priority.  As part of our continuing mission to provide you with exceptional heart care, we have created designated Provider Care Teams.  These Care Teams include your primary Cardiologist (physician) and Advanced Practice Providers (APPs -  Physician Assistants and Nurse Practitioners) who all work together to provide you with the care you need, when you need it.  Your physician wants you to follow-up in:  6 months with the Afib Clinic. They will contact you to schedule.     You will receive a reminder letter in the mail two months in advance. If you don't receive a letter, please call our office to schedule the follow-up appointment.  We recommend signing up for the patient portal called "MyChart".  Sign up information is provided on this After Visit Summary.  MyChart is used to connect with patients for Virtual Visits (Telemedicine).  Patients are able to view lab/test results, encounter notes, upcoming appointments, etc.  Non-urgent messages can be sent to your provider as well.   To learn more about what you can do with MyChart, go to [email or web address].    Any Other Special Instructions Will Be Listed Below (If Applicable).

## 2021-06-02 ENCOUNTER — Other Ambulatory Visit: Payer: Self-pay | Admitting: Internal Medicine

## 2021-07-04 NOTE — Progress Notes (Signed)
Cardiology Office Note:    Date:  07/06/2021   ID:  Ethan Lambert, DOB 12-17-1960, MRN [id number]  PCP:  Maximiano Coss, NP   [place]  Cardiologist:  Werner Lean, MD  Advanced Practice Provider:  No care team member to display Electrophysiologist:  Thompson Grayer, MD      CC:  Follow up post surgery  History of Present Illness:    Ethan Lambert is a 61 y.o. male with a hx of PAF s/p Ablation and flecainide PIP CHADSVASC 3 on eliquis. HTN who presents for evaluation 09/14/20.  In interim of this visit, patient had CCTA consistent with non-obstructive disease.   We discussed is surgical risk for shoulder surgery and felt he was a reasonable candidate.  Seen 07/05/21.  Patient notes that he is doing OK- he had surgery on his shoulder which improved but he has neck and back pain and may need surgery as well.  He is starting therapy and injections and waiting for approval for surgery. Also has leg numbness with prolonged standing.  He is planned for a lot of potential surgical interventions. There are no interval hospital/ED visit.    No chest pain or pressure .  No SOB/DOE and no PND/Orthopnea.  No weight gain or leg swelling.  Still has small palpitations 1-2 a week.  Is on flecainine and off of soft drinks which improved symptoms.   Rosuvastatin stopped for leg pain (that resolved).  Past Medical History:  Diagnosis Date   Anemia    Arthritis    "probably right ring finger joint" (05/28/2018)   Childhood asthma    as child none now   Clotting disorder ([place])    CVA (cerebral vascular accident) ([place]) 1999 X 2   LEFT FRONTAL & OCCIPITAL , NON-HEMORRHAGIC; no residual   Family history of malignant neoplasm of gastrointestinal tract    Heart murmur    History of blood transfusion 1999   "related to the stroke"   History of kidney stones    HIV disease ([place]) dx 1999   MONITORED BY INFECTIOUS DISEASE -- DR [place]   Hypertension     Paroxysmal atrial fibrillation ([place])    CARDIOLOGIST--  DR Rayann Heman   Right knee meniscal tear    Seizures ([place])    1999   Skin cancer    skin   Sleep apnea    "didn't follow thru w/mask" (05/28/2018)    Past Surgical History:  Procedure Laterality Date   APPENDECTOMY  06-09-2003   ATRIAL FIBRILLATION ABLATION  05/28/2018   ATRIAL FIBRILLATION ABLATION N/A 05/28/2018   Procedure: ATRIAL FIBRILLATION ABLATION;  Surgeon: Thompson Grayer, MD;  Location: [facility];  Service: Cardiovascular;  Laterality: N/A;   BACK SURGERY     INGUINAL HERNIA REPAIR Bilateral 1990s   KNEE ARTHROSCOPY WITH MEDIAL MENISECTOMY Right 12/31/2013   Procedure: RIGHT KNEE ARTHROSCOPY PARTIAL MEDIAL MENISCECTOMY DEBRIDEMENT AND CHONDROPLASTY ;  Surgeon: Sydnee Cabal, MD;  Location: [facility];  Service: Orthopedics;  Laterality: Right;   KNEE ARTHROSCOPY WITH MEDIAL MENISECTOMY Left 01/27/2019   Procedure: LEFT KNEE ARTHROSCOPY WITH PARTIAL MEDIAL MENISCECTOMY;  Surgeon: Leandrew Koyanagi, MD;  Location: [facility];  Service: Orthopedics;  Laterality: Left;   LAPAROSCOPIC CHOLECYSTECTOMY  07-08-2003   LASER ABLATION ANAL CONDYLOMA  05-18-2001   LUMBAR LAMINECTOMY/DECOMPRESSION MICRODISCECTOMY Left 03/15/2015   Procedure: Left Lumbar five- sacral one microdiskectomy;  Surgeon: Consuella Lose, MD;  Location: [facility];  Service: Neurosurgery;  Laterality: Left;  Left L5S1 microdiskectomy   NEGATIVE SLEEP STUDY  01-03-2012  in epic   SKIN CANCER EXCISION Right    "cut it off my shoulder"   TRANSTHORACIC ECHOCARDIOGRAM  12-01-2008   MODERATE LVH/  EF 55-60%/  MILD MR/  NEGATIVE BUBBLE STUDY    Current Medications: Current Meds  Medication Sig   apixaban (ELIQUIS) 5 MG TABS tablet TAKE 1 TABLET BY MOUTH  TWICE DAILY   diltiazem (CARDIZEM CD) 240 MG 24 hr capsule TAKE 1 CAPSULE BY MOUTH EVERY DAY   diltiazem (CARDIZEM) 30 MG tablet Take 1 tablet every 4 hours AS NEEDED for afib  heart rate >100   flecainide (TAMBOCOR) 50 MG tablet TAKE 1 TABLET BY MOUTH TWICE A DAY   oxyCODONE (OXY IR/ROXICODONE) 5 MG immediate release tablet as needed.   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 There is no history of Rectal cancer or Stomach cancer. History of coronary artery disease notable for two brothers in their six. History of heart failure notable for no membrs. History of arrhythmia notable for father having A fib. Denies family history of sudden cardiac death including drowning, car accidents, or unexplained deaths in the family. No  history of bicuspid aortic valve or aortic aneurysm or dissection.   ROS:   Please see the history of present illness.     All other systems reviewed and are negative.  EKGs/Labs/Other Studies Reviewed:    The following studies were reviewed today:  EKG:  08/23/20:  SR 64 1st AV block  Transthoracic Echocardiogram: Date: 9/20/217 Results: Study Conclusions   - Left ventricle: The cavity size was normal. Wall thickness was    increased in a pattern of mild LVH. Systolic function was normal.    The estimated ejection fraction was in the range of 55% to 60%.    Wall motion was normal; there were no regional wall motion    abnormalities. Left ventricular diastolic function parameters    were  normal.  - Aortic valve: There was no stenosis. There was trivial    regurgitation.  - Mitral valve: There was no significant regurgitation.  - Right ventricle: The cavity size was normal. Systolic function    was normal.  - Right atrium: The atrium was mildly dilated.  - Pulmonary arteries: PA peak pressure: 28 mm Hg (S).  - Systemic veins: IVC measured 2.5 cm with > 50% respirophasic    variation, suggesting RA pressure 8 mmHg.   Echo done today:  Mild AI normal LV function; Aortic Valve calcification   Cardiac CT PV: Date: 2019 Results: IMPRESSION: 1. There is normal pulmonary vein drainage into the left atrium.   2. The left atrial appendage is large with chicken wing morphology and no thrombus.   3. The esophagus runs in the left atrial midline and is not in the proximity to any of the pulmonary veins.   4. Calcium score was 175 that represents 83 percentile for age/sex.   Cardiac CT : Date: 10/05/20 Results: IMPRESSION: 1. Coronary calcium score of 301. This was 86th percentile for age, sex, and race matched control.   2. Normal coronary origin with Right dominance.   3. CAD-RADS 2. Mild non-obstructive CAD (25-49%) worst in the Ramus Intermedius (small vessel).  Consider non-atherosclerotic causes of chest pain. Consider preventive therapy and risk factor modification.   4. 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  Coronary sinus dilation: 14 mm  IMPRESSION: 1.  Aortic Atherosclerosis  ECG or NM Stress Testing : Date: 09/14/2020 Results:  Nuclear stress EF: 60%. No T wave inversion was noted during stress. There was no ST segment deviation noted during stress. Defect 1: There is a medium defect of moderate severity present in the apical septal, apical lateral and apex location. This is a low risk study.   Medium size, moderate intensity fixed apical, apical septal and apical lateral perfusion defect, suspicious for artifact. No significant reversible ischemia. LVEF 60% with normal wall motion. This is a low risk study. No prior for comparison.    Recent Labs: 08/15/2020: BUN 19; Creat 1.05; Hemoglobin 14.7; Platelets 223; Potassium 4.5; Sodium 139 02/12/2021: ALT 43  Recent Lipid Panel    Component Value Date/Time   CHOL 132 02/12/2021 0731   TRIG 207 (H) 02/12/2021 0731   TRIG 369 02/16/2010 0000   HDL 30 (L) 02/12/2021 0731   CHOLHDL 4.4 02/12/2021 0731   CHOLHDL 6.2 (H) 08/15/2020 0920   VLDL 36 (H) 11/27/2015 0915   LDLCALC 68 02/12/2021 0731   LDLCALC 115 (H) 08/15/2020 0920    Risk Assessment/Calculations:    CHA2DS2-VASc Score = 3  This indicates a 3.2% annual risk of stroke. The patient's score is based upon: CHF History: 0 HTN History: 1 Diabetes History: 0 Stroke History: 2 Vascular Disease History: 0 Age Score: 0 Gender Score: 0      Physical Exam:    VS:  BP 140/74    Pulse 62    Ht 6\' 1"  (1.854 m)    Wt 130.2 kg    SpO2 96%    BMI 37.87 kg/m     Wt Readings from Last 3 Encounters:  07/06/21 130.2 kg  05/28/21 132.5 kg  11/17/20 125.7 kg    Gen: No distress, Morbid Obesity   Neck: No JVD,  Ears: Bilateral Pilar Plate Sign Cardiac: No Rubs or Gallops, no Murmur,  regular rhythm +2 radial pulses distant heart sounds Respiratory: Clear to auscultation  bilaterally, normal effort, normal  respiratory rate GI: Soft, nontender, non-distended  MS: No  edema;  moves all extremities Integument: Skin feels warm Neuro:  At time of evaluation, alert and oriented to person/place/time/situation  Psych: Normal affect, patient feels ok   ASSESSMENT:    1. Paroxysmal atrial fibrillation ([facility])   2. Aortic atherosclerosis ([place])   3. Morbid obesity ([place])   4. HIV infection, unspecified symptom status ([place])     PLAN:     PAF on Flecainide and Eliquis Acquired Thrombophilia Morbid Obesity with HTN LD (LDL goal  at least < 70 if not 55) Aortic atherosclerosis and mild non obstructive CAD (OK for flecainide) Statin myopathy (rosuvastatin leg weakness) HIV on HAART - will continue flecainide 50 and diltaizem 240; we discussed increasing flecainide and patient feels that he is doing ok for now,  EP f/u in sx months - lipids today and likely lipid clinic, we have discussed diet and exercise changes including his transition away from fatty breakfast foods and exercises that could alleviate pressure on his knnes - will continue ambulatory BP monitoring   One year follow up with me unless new issues         Medication Adjustments/Labs and Tests Ordered: Current medicines are reviewed at length with the patient today.  Concerns regarding medicines are outlined above.  Orders Placed This Encounter  Procedures   ALT   Lipid panel   No orders of the defined types were placed in this encounter.   Patient Instructions  Medication Instructions:  Your physician recommends that you continue on your current medications as directed. Please refer to the Current Medication list given to you today.  *If you need a refill on your cardiac medications before your next appointment, please call your pharmacy*   Lab Work: TODAY: FLP and ALT If you have labs (blood  work) drawn today and your tests are completely normal, you will receive your results only by: [place] (if you have MyChart) OR A paper copy in the mail If you have any lab test that is abnormal or we need to change your treatment, we will call you to review the results.   Testing/Procedures: NONE   Follow-Up: At [facility], you and your health needs are our priority.  As part of our continuing mission to provide you with exceptional heart care, we have created designated Provider Care Teams.  These Care Teams include your primary Cardiologist (physician) and Advanced Practice Providers (APPs -  Physician Assistants and Nurse Practitioners) who all work together to provide you with the care you need, when you need it.   Your next appointment:   12 month(s)  The format for your next appointment:   In Person  Provider:   Werner Lean, MD      Signed, Werner Lean, MD  07/06/2021 8:26 AM    [place]

## 2021-07-06 ENCOUNTER — Other Ambulatory Visit: Payer: Self-pay

## 2021-07-06 ENCOUNTER — Ambulatory Visit: Payer: Managed Care, Other (non HMO) | Admitting: Internal Medicine

## 2021-07-06 ENCOUNTER — Encounter: Payer: Self-pay | Admitting: Internal Medicine

## 2021-07-06 VITALS — BP 140/74 | HR 62 | Ht 73.0 in | Wt 287.0 lb

## 2021-07-06 DIAGNOSIS — B20 Human immunodeficiency virus [HIV] disease: Secondary | ICD-10-CM

## 2021-07-06 DIAGNOSIS — I700 Atherosclerosis of aorta: Secondary | ICD-10-CM | POA: Diagnosis not present

## 2021-07-06 DIAGNOSIS — I480 Paroxysmal atrial fibrillation: Secondary | ICD-10-CM

## 2021-07-06 LAB — LIPID PANEL
Chol/HDL Ratio: 6.2 ratio — ABNORMAL HIGH (ref 0.0–5.0)
Cholesterol, Total: 180 mg/dL (ref 100–199)
HDL: 29 mg/dL — ABNORMAL LOW (ref >39)
LDL Chol Calc (NIH): 102 mg/dL — ABNORMAL HIGH (ref 0–99)
Triglycerides: 289 mg/dL — ABNORMAL HIGH (ref 0–149)
VLDL Cholesterol Cal: 49 mg/dL — ABNORMAL HIGH (ref 5–40)

## 2021-07-06 LAB — ALT: ALT: 48 IU/L — ABNORMAL HIGH (ref 0–44)

## 2021-07-06 NOTE — Patient Instructions (Signed)
Medication Instructions:  Your physician recommends that you continue on your current medications as directed. Please refer to the Current Medication list given to you today.  *If you need a refill on your cardiac medications before your next appointment, please call your pharmacy*   Lab Work: TODAY: FLP and ALT If you have labs (blood work) drawn today and your tests are completely normal, you will receive your results only by: [place] (if you have MyChart) OR A paper copy in the mail If you have any lab test that is abnormal or we need to change your treatment, we will call you to review the results.   Testing/Procedures: NONE   Follow-Up: At [facility], you and your health needs are our priority.  As part of our continuing mission to provide you with exceptional heart care, we have created designated Provider Care Teams.  These Care Teams include your primary Cardiologist (physician) and Advanced Practice Providers (APPs -  Physician Assistants and Nurse Practitioners) who all work together to provide you with the care you need, when you need it.   Your next appointment:   12 month(s)  The format for your next appointment:   In Person  Provider:   Werner Lean, MD

## 2021-07-09 ENCOUNTER — Telehealth: Payer: Self-pay

## 2021-07-09 DIAGNOSIS — E785 Hyperlipidemia, unspecified: Secondary | ICD-10-CM

## 2021-07-09 DIAGNOSIS — I700 Atherosclerosis of aorta: Secondary | ICD-10-CM

## 2021-07-09 NOTE — Telephone Encounter (Signed)
The patient has been notified of the result and verbalized understanding.  All questions (if any) were answered. Ferdinand, RN 07/09/2021 2:06 PM   Pt reports restarted rosuvastatin 10 mg PO QD 3 days ago.  At this time is not having any muscle aches.  Will call the office if muscle aches develop.  F/U fasting labs scheduled for 10/08/21

## 2021-07-09 NOTE — Telephone Encounter (Signed)
-----   Message from Werner Lean, MD sent at 07/08/2021  2:20 PM EST ----- LDL above goal Return rosuvastatin (he was not taking) ----- Message ----- From: Interface, [facility] Lab Results In Sent: 07/06/2021   5:36 PM EST To: Werner Lean, MD

## 2021-07-20 ENCOUNTER — Telehealth: Payer: Self-pay | Admitting: *Deleted

## 2021-07-20 NOTE — Telephone Encounter (Signed)
° °  Pre-operative Risk Assessment    Patient Name: Ethan Lambert  DOB: [birth year] MRN: [id number]      Request for Surgical Clearance    Procedure:   CERVICAL ESI  Date of Surgery:  Clearance 07/26/21                                 Surgeon:  NOT LISTED Surgeon's Group or Practice Name:  [facility] Phone number:  [phone] Fax number:  [phone]   Type of Clearance Requested:   - Medical  - Pharmacy:  Hold Apixaban (Eliquis) x 3 DAYS PRIOR   Type of Anesthesia:  Not Indicated   Additional requests/questions:    Jiles Prows   07/20/2021, 11:02 AM

## 2021-07-20 NOTE — Telephone Encounter (Signed)
Patient with diagnosis of A Fib on Eliquis for anticoagulation.    Procedure:  CERVICAL ESI Date of procedure: 07/26/21   CHA2DS2-VASc Score = 4  This indicates a 4.8% annual risk of stroke. The patient's score is based upon: CHF History: 0 HTN History: 1 Diabetes History: 0 Stroke History: 2 Vascular Disease History: 1 Age Score: 0 Gender Score: 0    CrCl 106 mL/min using adjusted body weight Platelet count 223K  Per office protocol, patient can hold Eliquis for 3 days prior to procedure.

## 2021-07-25 ENCOUNTER — Other Ambulatory Visit: Payer: Self-pay | Admitting: Internal Medicine

## 2021-07-25 DIAGNOSIS — B20 Human immunodeficiency virus [HIV] disease: Secondary | ICD-10-CM

## 2021-07-27 ENCOUNTER — Other Ambulatory Visit (HOSPITAL_COMMUNITY): Payer: Self-pay | Admitting: Nurse Practitioner

## 2021-08-20 ENCOUNTER — Other Ambulatory Visit: Payer: Self-pay | Admitting: Internal Medicine

## 2021-08-20 DIAGNOSIS — B20 Human immunodeficiency virus [HIV] disease: Secondary | ICD-10-CM

## 2021-09-12 ENCOUNTER — Other Ambulatory Visit: Payer: Self-pay | Admitting: Internal Medicine

## 2021-09-12 DIAGNOSIS — B20 Human immunodeficiency virus [HIV] disease: Secondary | ICD-10-CM

## 2021-10-08 ENCOUNTER — Other Ambulatory Visit: Payer: Managed Care, Other (non HMO)

## 2021-10-08 DIAGNOSIS — E785 Hyperlipidemia, unspecified: Secondary | ICD-10-CM

## 2021-10-08 DIAGNOSIS — I700 Atherosclerosis of aorta: Secondary | ICD-10-CM

## 2021-10-08 LAB — LIPID PANEL
Chol/HDL Ratio: 4.3 ratio (ref 0.0–5.0)
Cholesterol, Total: 121 mg/dL (ref 100–199)
HDL: 28 mg/dL — ABNORMAL LOW (ref >39)
LDL Chol Calc (NIH): 63 mg/dL (ref 0–99)
Triglycerides: 180 mg/dL — ABNORMAL HIGH (ref 0–149)
VLDL Cholesterol Cal: 30 mg/dL (ref 5–40)

## 2021-10-08 LAB — HEPATIC FUNCTION PANEL
ALT: 26 IU/L (ref 0–44)
AST: 28 IU/L (ref 0–40)
Albumin: 4.2 g/dL (ref 3.8–4.9)
Alkaline Phosphatase: 80 IU/L (ref 44–121)
Bilirubin Total: 0.6 mg/dL (ref 0.0–1.2)
Bilirubin, Direct: 0.18 mg/dL (ref 0.00–0.40)
Total Protein: 7 g/dL (ref 6.0–8.5)

## 2021-10-09 ENCOUNTER — Other Ambulatory Visit: Payer: Self-pay | Admitting: Internal Medicine

## 2021-10-09 DIAGNOSIS — B20 Human immunodeficiency virus [HIV] disease: Secondary | ICD-10-CM

## 2021-10-31 ENCOUNTER — Ambulatory Visit (INDEPENDENT_AMBULATORY_CARE_PROVIDER_SITE_OTHER): Payer: Commercial Managed Care - HMO | Admitting: Internal Medicine

## 2021-10-31 ENCOUNTER — Encounter: Payer: Self-pay | Admitting: Internal Medicine

## 2021-10-31 ENCOUNTER — Other Ambulatory Visit: Payer: Self-pay

## 2021-10-31 VITALS — BP 158/95 | HR 59 | Temp 97.9°F | Wt 288.4 lb

## 2021-10-31 DIAGNOSIS — B20 Human immunodeficiency virus [HIV] disease: Secondary | ICD-10-CM | POA: Diagnosis not present

## 2021-10-31 DIAGNOSIS — Z113 Encounter for screening for infections with a predominantly sexual mode of transmission: Secondary | ICD-10-CM | POA: Insufficient documentation

## 2021-10-31 DIAGNOSIS — Z5181 Encounter for therapeutic drug level monitoring: Secondary | ICD-10-CM

## 2021-10-31 MED ORDER — TRIUMEQ 600-50-300 MG PO TABS: 1.0000 | ORAL_TABLET | Freq: Every day | ORAL | 11 refills | Status: DC

## 2021-10-31 NOTE — Progress Notes (Signed)
? ?  Subjective:  ? ? Patient ID: Ethan Lambert, male    DOB: 07/04/1960, 61 y.o.   MRN: [id number] ? ?HPI ?Here for follow up of HIV ?He continues on Triumeq and denies any missed doses.  No issues with getting, taking or tolerating his medication.  Still with neck and shoulder pain from a bulging disc.  Daughter now is 33 years old and doing well.  ? ? ?Review of Systems  ?Constitutional:  Negative for fatigue.  ?Gastrointestinal:  Negative for diarrhea and nausea.  ?Skin:  Negative for rash.  ? ?   ?Objective:  ? Physical Exam ?Eyes:  ?   General: No scleral icterus. ?Pulmonary:  ?   Effort: Pulmonary effort is normal.  ?Skin: ?   Findings: No rash.  ?Neurological:  ?   General: No focal deficit present.  ?   Mental Status: He is alert.  ? ? ?SH: no tobacco ? ? ?   ?Assessment & Plan:  ? ? ?

## 2021-10-31 NOTE — Assessment & Plan Note (Signed)
Will screen; low risk with stable partner. ?

## 2021-10-31 NOTE — Assessment & Plan Note (Signed)
Recent hepatic panel wnl.  Will check a bmp ?

## 2021-10-31 NOTE — Assessment & Plan Note (Signed)
He is doing well and no concerns.  Will continue with the Triumeq for now while he remains on disability and is covered well on his insurance.  Refills sent and he can continue with yearly follow up ?

## 2021-11-01 ENCOUNTER — Telehealth: Payer: Self-pay | Admitting: Internal Medicine

## 2021-11-01 LAB — T-HELPER CELL (CD4) - (RCID CLINIC ONLY)
CD4 % Helper T Cell: 37 % (ref 33–65)
CD4 T Cell Abs: 729 /uL (ref 400–1790)

## 2021-11-01 LAB — URINE CYTOLOGY ANCILLARY ONLY
Chlamydia: NEGATIVE
Comment: NEGATIVE
Comment: NORMAL
Neisseria Gonorrhea: NEGATIVE

## 2021-11-01 NOTE — Telephone Encounter (Signed)
Called pt scheduled OV with Dr. Gasper Sells for 11/08/21 at 10 am.  ?

## 2021-11-01 NOTE — Telephone Encounter (Signed)
Pt reports muscle aches started when began taking rosuvastatin.  Has tried to tough out the pain but stopped taking medication 2 days ago. Reports pain became intolerable.  Advised pt would send concern to MD to review and advise.    ?

## 2021-11-01 NOTE — Telephone Encounter (Signed)
Pt c/o medication issue: ? ?1. Name of Medication: Rosuvastatin ? ?2. How are you currently taking this medication (dosage and times per day)? 1/2 tablet at night ? ?3. Are you having a reaction (difficulty breathing--STAT)?  ? ?4. What is your medication issue? Heavy aching from his hip on down ? ?

## 2021-11-02 LAB — CBC WITH DIFFERENTIAL/PLATELET
Absolute Monocytes: 593 cells/uL (ref 200–950)
Basophils Absolute: 51 cells/uL (ref 0–200)
Basophils Relative: 0.9 %
Eosinophils Absolute: 182 cells/uL (ref 15–500)
Eosinophils Relative: 3.2 %
HCT: 43.8 % (ref 38.5–50.0)
Hemoglobin: 14.5 g/dL (ref 13.2–17.1)
Lymphs Abs: 1995 cells/uL (ref 850–3900)
MCH: 30.3 pg (ref 27.0–33.0)
MCHC: 33.1 g/dL (ref 32.0–36.0)
MCV: 91.6 fL (ref 80.0–100.0)
MPV: 10.1 fL (ref 7.5–12.5)
Monocytes Relative: 10.4 %
Neutro Abs: 2879 cells/uL (ref 1500–7800)
Neutrophils Relative %: 50.5 %
Platelets: 249 10*3/uL (ref 140–400)
RBC: 4.78 10*6/uL (ref 4.20–5.80)
RDW: 13.3 % (ref 11.0–15.0)
Total Lymphocyte: 35 %
WBC: 5.7 10*3/uL (ref 3.8–10.8)

## 2021-11-02 LAB — HIV-1 RNA QUANT-NO REFLEX-BLD
HIV 1 RNA Quant: NOT DETECTED copies/mL
HIV-1 RNA Quant, Log: NOT DETECTED Log copies/mL

## 2021-11-02 LAB — RPR: RPR Ser Ql: NONREACTIVE

## 2021-11-02 LAB — BASIC METABOLIC PANEL
BUN: 15 mg/dL (ref 7–25)
CO2: 23 mmol/L (ref 20–32)
Calcium: 8.8 mg/dL (ref 8.6–10.3)
Chloride: 107 mmol/L (ref 98–110)
Creat: 0.89 mg/dL (ref 0.70–1.35)
Glucose, Bld: 109 mg/dL — ABNORMAL HIGH (ref 65–99)
Potassium: 4.3 mmol/L (ref 3.5–5.3)
Sodium: 140 mmol/L (ref 135–146)

## 2021-11-05 NOTE — Progress Notes (Signed)
Cardiology Office Note:    Date:  11/08/2021   ID:  [name], DOB 1961/03/15, MRN [id number]  PCP:  Maximiano Coss, NP   [place]  Cardiologist:  Werner Lean, MD  Advanced Practice Provider:  No care team member to display Electrophysiologist:  Thompson Grayer, MD      CC:  DOD- myalgias  History of Present Illness:    DEWANE TIMSON is a 61 y.o. male with a hx of PAF s/p Ablation and flecainide PIP CHADSVASC 3 on eliquis. HTN who presents for evaluation 09/14/20.  2022: CCTA consistent with non-obstructive disease.   We discussed is surgical risk for shoulder surgery and felt he was a reasonable candidate.  No cardiac complications. 2023: new myalgias after failing statins (rosuvastatin irrespective of dose)  Patient notes that he is doing feeling better.   Since day prior/last visit notes that on retrial of rosuvastatin he has repeat mylagias. Kept him from being physically active. Myalgias have resolved of the medication There are no interval hospital/ED visit.    No chest pain or pressure .  No SOB/DOE and no PND/Orthopnea.  No weight gain or leg swelling.    Still have rare fluttering.  No hard episodes from the past.  Tolerable.   Past Medical History:  Diagnosis Date   Anemia    Arthritis    "probably right ring finger joint" (05/28/2018)   Childhood asthma    as child none now   Clotting disorder ([place])    CVA (cerebral vascular accident) ([place]) 1999 X 2   LEFT FRONTAL & OCCIPITAL , NON-HEMORRHAGIC; no residual   Family history of malignant neoplasm of gastrointestinal tract    Heart murmur    History of blood transfusion 1999   "related to the stroke"   History of kidney stones    HIV disease ([place]) dx 1999   MONITORED BY INFECTIOUS DISEASE -- DR [place]   Hypertension    Paroxysmal atrial fibrillation ([place])    CARDIOLOGIST--  DR Rayann Heman   Right knee meniscal tear    Seizures ([place])    1999   Skin cancer     skin   Sleep apnea    "didn't follow thru w/mask" (05/28/2018)    Past Surgical History:  Procedure Laterality Date   APPENDECTOMY  06-09-2003   ATRIAL FIBRILLATION ABLATION  05/28/2018   ATRIAL FIBRILLATION ABLATION N/A 05/28/2018   Procedure: ATRIAL FIBRILLATION ABLATION;  Surgeon: Thompson Grayer, MD;  Location: [facility];  Service: Cardiovascular;  Laterality: N/A;   BACK SURGERY     INGUINAL HERNIA REPAIR Bilateral 1990s   KNEE ARTHROSCOPY WITH MEDIAL MENISECTOMY Right 12/31/2013   Procedure: RIGHT KNEE ARTHROSCOPY PARTIAL MEDIAL MENISCECTOMY DEBRIDEMENT AND CHONDROPLASTY ;  Surgeon: Sydnee Cabal, MD;  Location: [facility];  Service: Orthopedics;  Laterality: Right;   KNEE ARTHROSCOPY WITH MEDIAL MENISECTOMY Left 01/27/2019   Procedure: LEFT KNEE ARTHROSCOPY WITH PARTIAL MEDIAL MENISCECTOMY;  Surgeon: Leandrew Koyanagi, MD;  Location: [facility];  Service: Orthopedics;  Laterality: Left;   LAPAROSCOPIC CHOLECYSTECTOMY  07-08-2003   LASER ABLATION ANAL CONDYLOMA  05-18-2001   LUMBAR LAMINECTOMY/DECOMPRESSION MICRODISCECTOMY Left 03/15/2015   Procedure: Left Lumbar five- sacral one microdiskectomy;  Surgeon: Consuella Lose, MD;  Location: [facility];  Service: Neurosurgery;  Laterality: Left;  Left L5S1 microdiskectomy   NEGATIVE SLEEP STUDY  01-03-2012  in epic   SKIN CANCER EXCISION Right    "cut it off  my shoulder"   TRANSTHORACIC ECHOCARDIOGRAM  12-01-2008   MODERATE LVH/  EF 55-60%/  MILD MR/  NEGATIVE BUBBLE STUDY    Current Medications: Current Meds  Medication Sig   abacavir-dolutegravir-lamiVUDine (TRIUMEQ) 600-50-300 MG tablet Take 1 tablet by mouth daily.   diltiazem (CARDIZEM CD) 240 MG 24 hr capsule TAKE 1 CAPSULE BY MOUTH EVERY DAY   diltiazem (CARDIZEM) 30 MG tablet Take 1 tablet every 4 hours AS NEEDED for afib heart rate >100   ezetimibe (ZETIA) 10 MG tablet Take 1 tablet (10 mg total) by mouth daily.   flecainide (TAMBOCOR) 50  MG tablet TAKE 1 TABLET BY MOUTH TWICE A DAY   oxyCODONE (OXY IR/ROXICODONE) 5 MG immediate release tablet as needed.   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 There is no history of Rectal cancer or Stomach cancer.  History of coronary artery disease notable for two brothers in their six. History of heart failure notable for no membrs. History of arrhythmia notable for father having A fib. Denies family history of sudden cardiac death including drowning, car accidents, or unexplained deaths in the family. No history of  bicuspid aortic valve or aortic aneurysm or dissection.   ROS:   Please see the history of present illness.     All other systems reviewed and are negative.  EKGs/Labs/Other Studies Reviewed:    The following studies were reviewed today:  EKG:  08/23/20:  SR 64 1st AV block  Transthoracic Echocardiogram: Date: 09/14/20 Results:  1. Left ventricular ejection fraction, by estimation, is 60 to 65%. The  left ventricle has normal function. The left ventricle has no regional  wall motion abnormalities. There is mild concentric left ventricular  hypertrophy. Left ventricular diastolic  parameters were normal.   2. Right ventricular systolic function is normal. The right ventricular  size is normal. Tricuspid regurgitation signal is inadequate for assessing  PA pressure.   3. Left atrial size was moderately dilated.   4. The  mitral valve is normal in structure. No evidence of mitral valve  regurgitation. No evidence of mitral stenosis.   5. Cannot exclude partial fusion of the left and non-coronary aortic  valve cusps. The noncoronary cusp is disproportionately thickened and  severely calcified. The aortic valve has an indeterminant number of cusps.  There is severe calcifcation of the  aortic valve. There is moderate thickening of the aortic valve. Aortic  valve regurgitation is mild. Mild to moderate aortic valve  sclerosis/calcification is present, without any evidence of aortic  stenosis.   6. There is borderline dilatation of the ascending aorta, measuring 37  mm.   7. The inferior vena cava is normal in size with greater than 50%  respiratory variability, suggesting right atrial pressure of 3 mmHg.    Cardiac CT : Date: 10/05/20 Results: IMPRESSION: 1. Coronary calcium score of 301. This was 86th percentile for age, sex, and race matched control.   2. Normal coronary origin with Right dominance.   3. CAD-RADS 2. Mild non-obstructive CAD (25-49%) worst in the  [place] Intermedius (small vessel). Consider non-atherosclerotic causes of chest pain. Consider preventive therapy and risk factor modification.   4. 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  Coronary sinus dilation: 14 mm  6. Tril-leaflet aortic valve with increase AV calcium  IMPRESSION: 1.  Aortic Atherosclerosis  ECG or NM Stress Testing : Date: 09/14/2020 Results:  Nuclear stress EF: 60%. No T wave inversion was noted during stress. There was no ST segment deviation noted during stress. Defect 1: There is a medium defect of moderate severity present in the apical septal, apical lateral and apex location. This is a low risk study.   Medium size, moderate intensity fixed apical, apical septal and apical lateral perfusion defect, suspicious for artifact. No significant reversible ischemia. LVEF 60% with normal wall motion. This is a low risk study. No prior for comparison.    Recent Labs: 10/08/2021: ALT 26 10/31/2021: BUN 15; Creat 0.89; Hemoglobin 14.5; Platelets 249; Potassium 4.3; Sodium 140  Recent Lipid Panel    Component Value Date/Time   CHOL 121 10/08/2021 0727   TRIG 180 (H) 10/08/2021 0727   TRIG 369 02/16/2010 0000   HDL 28 (L) 10/08/2021 0727   CHOLHDL 4.3 10/08/2021 0727   CHOLHDL 6.2 (H) 08/15/2020 0920   VLDL 36 (H) 11/27/2015 0915   LDLCALC 63 10/08/2021 0727   LDLCALC 115 (H) 08/15/2020 0920    Risk Assessment/Calculations:    CHA2DS2-VASc Score = 4  This indicates a 4.8% annual risk of stroke. The patient's score is based upon: CHF History: 0 HTN History: 1 Diabetes History: 0 Stroke History: 2 Vascular Disease History: 1 Age Score: 0 Gender Score: 0      Physical Exam:    VS:  BP 138/68   Pulse 71   Ht '6\' 1"'$  (1.854 m)   Wt 285 lb (129.3 kg)   SpO2 98%   BMI 37.60 kg/m     Wt Readings from Last 3 Encounters:  11/08/21 285 lb (129.3 kg)  10/31/21 288 lb 6.4 oz (130.8 kg)  07/06/21 287 lb  (130.2 kg)    Gen: No distress, Morbid Obesity   Neck: No JVD,  Ears: Bilateral Pilar Plate Sign Cardiac: No Rubs or Gallops, new systolic and diastolic Murmur, regular rhythm +2 radial pulses distant heart sounds Respiratory: Clear to auscultation bilaterally, normal effort, normal  respiratory rate GI: Soft, nontender, non-distended  MS: No  edema;  moves all extremities Integument: Skin feels warm Neuro:  At time of evaluation, alert and oriented to person/place/time/situation  Psych: Normal affect, patient feels ok   ASSESSMENT:    1. Aortic atherosclerosis ([place])   2. Nonrheumatic aortic valve insufficiency   3. Paroxysmal atrial fibrillation ([facility])   4. Essential hypertension      PLAN:     Aortic atherosclerosis and mild non obstructive CAD PAF on Flecainide and Eliquis (no moderate disease, reviewed with patients primary EP given 1c agent) Acquired Thrombophilia Morbid Obesity with HTN HLD(LDL goal  at least < 70) Statin myopathy (rosuvastatin myalgia) HIV on HAART - will continue flecainide 50 and diltaizem 240; we can have him meet with EP if worsening AF burden - will refill eliquis - unclear why off  lovaza - new heart murmur with prior AI and aortic sclerosis, will repeat echo - will start zetia 10 mg PO daily, if issues will trial pravastatin and send to lipid clinic again   One year with me         Medication Adjustments/Labs and Tests Ordered: Current medicines are reviewed at length with the patient today.  Concerns regarding medicines are outlined above.  Orders Placed This Encounter  Procedures   Lipid panel   ECHOCARDIOGRAM COMPLETE   Meds ordered this encounter  Medications   ezetimibe (ZETIA) 10 MG tablet    Sig: Take 1 tablet (10 mg total) by mouth daily.    Dispense:  90 tablet    Refill:  3    Patient Instructions  Medication Instructions:  Your physician has recommended you make the following change in your medication:  START:  ezetimibe (Zetia) 10 mg by mouth once daily   *If you need a refill on your cardiac medications before your next appointment, please call your pharmacy*   Lab Work: IN 3 MONTHS: fasting lipid panel If you have labs (blood work) drawn today and your tests are completely normal, you will receive your results only by: [place] (if you have MyChart) OR A paper copy in the mail If you have any lab test that is abnormal or we need to change your treatment, we will call you to review the results.   Testing/Procedures: Your physician has requested that you have an echocardiogram. Echocardiography is a painless test that uses sound waves to create images of your heart. It provides your doctor with information about the size and shape of your heart and how well your heart's chambers and valves are working. This procedure takes approximately one hour. There are no restrictions for this procedure.    Follow-Up: At [facility], you and your health needs are our priority.  As part of our continuing mission to provide you with exceptional heart care, we have created designated Provider Care Teams.  These Care Teams include your primary Cardiologist (physician) and Advanced Practice Providers (APPs -  Physician Assistants and Nurse Practitioners) who all work together to provide you with the care you need, when you need it.  We recommend signing up for the patient portal called "MyChart".  Sign up information is provided on this After Visit Summary.  MyChart is used to connect with patients for Virtual Visits (Telemedicine).  Patients are able to view lab/test results, encounter notes, upcoming appointments, etc.  Non-urgent messages can be sent to your provider as well.   To learn more about what you can do with MyChart, go to [email or web address].    Your next appointment:   1 year(s)  The format for your next appointment:   In Person  Provider:   Werner Lean, MD      Important Information About Sugar         Signed, Werner Lean, MD  11/08/2021 9:15 AM    [place]

## 2021-11-08 ENCOUNTER — Ambulatory Visit: Payer: Commercial Managed Care - HMO | Admitting: Internal Medicine

## 2021-11-08 ENCOUNTER — Encounter: Payer: Self-pay | Admitting: Internal Medicine

## 2021-11-08 ENCOUNTER — Other Ambulatory Visit: Payer: Self-pay

## 2021-11-08 VITALS — BP 138/68 | HR 71 | Ht 73.0 in | Wt 285.0 lb

## 2021-11-08 DIAGNOSIS — I10 Essential (primary) hypertension: Secondary | ICD-10-CM | POA: Diagnosis not present

## 2021-11-08 DIAGNOSIS — I480 Paroxysmal atrial fibrillation: Secondary | ICD-10-CM

## 2021-11-08 DIAGNOSIS — I351 Nonrheumatic aortic (valve) insufficiency: Secondary | ICD-10-CM

## 2021-11-08 DIAGNOSIS — I700 Atherosclerosis of aorta: Secondary | ICD-10-CM | POA: Diagnosis not present

## 2021-11-08 MED ORDER — APIXABAN 5 MG PO TABS: 5.0000 mg | ORAL_TABLET | Freq: Two times a day (BID) | ORAL | 1 refills | Status: DC

## 2021-11-08 MED ORDER — EZETIMIBE 10 MG PO TABS: 10.0000 mg | ORAL_TABLET | Freq: Every day | ORAL | 3 refills | Status: DC

## 2021-11-08 NOTE — Patient Instructions (Signed)
Medication Instructions:  Your physician has recommended you make the following change in your medication:  START: ezetimibe (Zetia) 10 mg by mouth once daily   *If you need a refill on your cardiac medications before your next appointment, please call your pharmacy*   Lab Work: IN 3 MONTHS: fasting lipid panel If you have labs (blood work) drawn today and your tests are completely normal, you will receive your results only by: [place] (if you have MyChart) OR A paper copy in the mail If you have any lab test that is abnormal or we need to change your treatment, we will call you to review the results.   Testing/Procedures: Your physician has requested that you have an echocardiogram. Echocardiography is a painless test that uses sound waves to create images of your heart. It provides your doctor with information about the size and shape of your heart and how well your heart's chambers and valves are working. This procedure takes approximately one hour. There are no restrictions for this procedure.    Follow-Up: At [facility], you and your health needs are our priority.  As part of our continuing mission to provide you with exceptional heart care, we have created designated Provider Care Teams.  These Care Teams include your primary Cardiologist (physician) and Advanced Practice Providers (APPs -  Physician Assistants and Nurse Practitioners) who all work together to provide you with the care you need, when you need it.  We recommend signing up for the patient portal called "MyChart".  Sign up information is provided on this After Visit Summary.  MyChart is used to connect with patients for Virtual Visits (Telemedicine).  Patients are able to view lab/test results, encounter notes, upcoming appointments, etc.  Non-urgent messages can be sent to your provider as well.   To learn more about what you can do with MyChart, go to [email or web address].    Your next appointment:   1  year(s)  The format for your next appointment:   In Person  Provider:   Werner Lean, MD     Important Information About Sugar

## 2021-11-08 NOTE — Telephone Encounter (Signed)
Prescription refill request for Eliquis received. Indication: Afib  Last office visit: 11/08/21 Gasper Sells) Scr: 0.89 (10/31/21) Age: 61 Weight: 129.3kg  Appropriate dose and refill sent to requested pharmacy.

## 2021-11-30 ENCOUNTER — Ambulatory Visit (HOSPITAL_COMMUNITY): Payer: Commercial Managed Care - HMO | Attending: Internal Medicine

## 2021-11-30 DIAGNOSIS — I700 Atherosclerosis of aorta: Secondary | ICD-10-CM | POA: Diagnosis present

## 2021-11-30 DIAGNOSIS — I080 Rheumatic disorders of both mitral and aortic valves: Secondary | ICD-10-CM | POA: Insufficient documentation

## 2021-11-30 DIAGNOSIS — I4891 Unspecified atrial fibrillation: Secondary | ICD-10-CM | POA: Diagnosis not present

## 2021-11-30 DIAGNOSIS — I2510 Atherosclerotic heart disease of native coronary artery without angina pectoris: Secondary | ICD-10-CM | POA: Insufficient documentation

## 2021-11-30 DIAGNOSIS — I351 Nonrheumatic aortic (valve) insufficiency: Secondary | ICD-10-CM | POA: Diagnosis not present

## 2021-11-30 LAB — ECHOCARDIOGRAM COMPLETE
AV Mean grad: 7 mmHg
Area-P 1/2: 3.75 cm2
P 1/2 time: 484 msec
S' Lateral: 2.4 cm

## 2021-12-06 ENCOUNTER — Encounter (HOSPITAL_COMMUNITY): Payer: Self-pay | Admitting: Nurse Practitioner

## 2021-12-06 ENCOUNTER — Ambulatory Visit (HOSPITAL_COMMUNITY)
Admission: RE | Admit: 2021-12-06 | Discharge: 2021-12-06 | Disposition: A | Discharge status: Home / Self Care | Payer: Commercial Managed Care - HMO | Source: Ambulatory Visit | Attending: Nurse Practitioner | Admitting: Nurse Practitioner

## 2021-12-06 VITALS — BP 138/72 | HR 59 | Ht 73.0 in | Wt 291.4 lb

## 2021-12-06 DIAGNOSIS — I4891 Unspecified atrial fibrillation: Secondary | ICD-10-CM

## 2021-12-06 DIAGNOSIS — Z79899 Other long term (current) drug therapy: Secondary | ICD-10-CM | POA: Insufficient documentation

## 2021-12-06 DIAGNOSIS — I480 Paroxysmal atrial fibrillation: Secondary | ICD-10-CM | POA: Insufficient documentation

## 2021-12-06 DIAGNOSIS — D6869 Other thrombophilia: Secondary | ICD-10-CM

## 2021-12-06 DIAGNOSIS — Z8249 Family history of ischemic heart disease and other diseases of the circulatory system: Secondary | ICD-10-CM | POA: Insufficient documentation

## 2021-12-06 DIAGNOSIS — I2510 Atherosclerotic heart disease of native coronary artery without angina pectoris: Secondary | ICD-10-CM | POA: Insufficient documentation

## 2021-12-06 DIAGNOSIS — Z7901 Long term (current) use of anticoagulants: Secondary | ICD-10-CM | POA: Insufficient documentation

## 2021-12-06 DIAGNOSIS — I10 Essential (primary) hypertension: Secondary | ICD-10-CM | POA: Insufficient documentation

## 2021-12-06 DIAGNOSIS — Z8616 Personal history of COVID-19: Secondary | ICD-10-CM | POA: Diagnosis not present

## 2021-12-06 NOTE — Progress Notes (Signed)
Patient ID: Ethan Lambert, male   DOB: [birth year], 61 y.o.   MRN: [id number]     Primary Care Physician: Maximiano Coss, NP Referring Physician: Dr. Becky Sax Ethan Lambert is a 61 y.o. male with a h/o PAF for evaluation s/p ablation 05/28/18. He has done well since then. Has had to take flecainide PIP x one time.  He is in SR this am.  No alcohol use, mod caffeine, usually not a trigger, negative sleep study in past.  He did have covid in December and had acute symptoms x one week but  felt fatigued  a little longer than that. He was able to recover at home. He had 11 family members with covid around the same time after thanksgiving and unfortunately his 20 yo father died from covid.CHA2DS2VASc score of 3, continues on eliquis.   F/u in afib clinic, 12/06/21. He is doing well staying on flecainde, low afib burden. He was offered repeat abaltion on last visit with Dr. Rayann Heman but deferred at that time. When he has afib it is not as symptomatic as in the past. He still does not want to pursue  repeat ablation. He was found to have mild obstructive CAD on cath several months ago. He has not been able to tolerate statins. On zetia now. Dr. Rayann Heman was ok to  continue flecainide with mild CAD.  Today, he denies symptoms of palpitations, chest pain, shortness of breath, orthopnea, PND, lower extremity edema, dizziness, presyncope, syncope, or neurologic sequela. The patient is tolerating medications without difficulties and is otherwise without complaint today.   Past Medical History:  Diagnosis Date   Anemia    Arthritis    "probably right ring finger joint" (05/28/2018)   Childhood asthma    as child none now   Clotting disorder ([place])    CVA (cerebral vascular accident) ([place]) 1999 X 2   LEFT FRONTAL & OCCIPITAL , NON-HEMORRHAGIC; no residual   Family history of malignant neoplasm of gastrointestinal tract    Heart murmur    History of blood transfusion 1999   "related to the stroke"   History  of kidney stones    HIV disease ([place]) dx 1999   MONITORED BY INFECTIOUS DISEASE -- DR [place]   Hypertension    Paroxysmal atrial fibrillation ([place])    CARDIOLOGIST--  DR Rayann Heman   Right knee meniscal tear    Seizures ([place])    1999   Skin cancer    skin   Sleep apnea    "didn't follow thru w/mask" (05/28/2018)   Past Surgical History:  Procedure Laterality Date   APPENDECTOMY  06-09-2003   ATRIAL FIBRILLATION ABLATION  05/28/2018   ATRIAL FIBRILLATION ABLATION N/A 05/28/2018   Procedure: ATRIAL FIBRILLATION ABLATION;  Surgeon: Thompson Grayer, MD;  Location: [facility];  Service: Cardiovascular;  Laterality: N/A;   BACK SURGERY     INGUINAL HERNIA REPAIR Bilateral 1990s   KNEE ARTHROSCOPY WITH MEDIAL MENISECTOMY Right 12/31/2013   Procedure: RIGHT KNEE ARTHROSCOPY PARTIAL MEDIAL MENISCECTOMY DEBRIDEMENT AND CHONDROPLASTY ;  Surgeon: Sydnee Cabal, MD;  Location: [facility];  Service: Orthopedics;  Laterality: Right;   KNEE ARTHROSCOPY WITH MEDIAL MENISECTOMY Left 01/27/2019   Procedure: LEFT KNEE ARTHROSCOPY WITH PARTIAL MEDIAL MENISCECTOMY;  Surgeon: Leandrew Koyanagi, MD;  Location: [facility];  Service: Orthopedics;  Laterality: Left;   LAPAROSCOPIC CHOLECYSTECTOMY  07-08-2003   LASER ABLATION ANAL CONDYLOMA  05-18-2001   LUMBAR LAMINECTOMY/DECOMPRESSION MICRODISCECTOMY Left 03/15/2015  Procedure: Left Lumbar five- sacral one microdiskectomy;  Surgeon: Consuella Lose, MD;  Location: [facility];  Service: Neurosurgery;  Laterality: Left;  Left L5S1 microdiskectomy   NEGATIVE SLEEP STUDY  01-03-2012  in epic   SKIN CANCER EXCISION Right    "cut it off my shoulder"   TRANSTHORACIC ECHOCARDIOGRAM  12-01-2008   MODERATE LVH/  EF 55-60%/  MILD MR/  NEGATIVE BUBBLE STUDY    Current Outpatient Medications  Medication Sig Dispense Refill   abacavir-dolutegravir-lamiVUDine (TRIUMEQ) 600-50-300 MG tablet Take 1 tablet by mouth daily. 30 tablet 11    apixaban (ELIQUIS) 5 MG TABS tablet Take 1 tablet (5 mg total) by mouth 2 (two) times daily. 180 tablet 1   diltiazem (CARDIZEM CD) 240 MG 24 hr capsule TAKE 1 CAPSULE BY MOUTH EVERY DAY 90 capsule 3   diltiazem (CARDIZEM) 30 MG tablet Take 1 tablet every 4 hours AS NEEDED for afib heart rate >100 30 tablet 1   ezetimibe (ZETIA) 10 MG tablet Take 1 tablet (10 mg total) by mouth daily. 90 tablet 3   flecainide (TAMBOCOR) 50 MG tablet TAKE 1 TABLET BY MOUTH TWICE A DAY 180 tablet 2   oxyCODONE (OXY IR/ROXICODONE) 5 MG immediate release tablet Take 5 mg by mouth as needed.     No current facility-administered medications for this encounter.    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   Social Determinants of Radio broadcast assistant Strain: Not on file  Food Insecurity: Not on file  Transportation Needs: Not on file  Physical Activity: Not on file  Stress: Not on file  Social Connections: Not on file  Intimate Partner Violence: Not on file    Family History  Problem Relation Age of Onset   Arrhythmia Father    Melanoma Father    Heart attack Brother    Prostate cancer Maternal Grandfather     Colon cancer Maternal Grandfather    Lung cancer Mother        smoker   Hypertension Mother    Hyperlipidemia Mother    Other Maternal Grandmother        harding of the arteries   Rectal cancer Neg Hx    Stomach cancer Neg Hx     ROS- All systems are reviewed and negative except as per the HPI above  Physical Exam: Vitals:   12/06/21 0825  Weight: 132.2 kg  Height: '6\' 1"'$  (1.854 m)    GEN- The patient is well appearing, alert and oriented x 3 today.   Head- normocephalic, atraumatic Eyes-  Sclera clear, conjunctiva pink Ears- hearing intact Oropharynx- clear Neck- supple, no JVP Lymph- no cervical lymphadenopathy Lungs- Clear to ausculation bilaterally, normal work of breathing Heart- Regular rate and rhythm, no murmurs, rubs or gallops, PMI not laterally displaced GI- soft, NT, ND, + BS Extremities- no clubbing, cyanosis, or edema MS- no significant deformity or atrophy Skin- no rash or lesion Psych- euthymic mood, full affect Neuro- strength and sensation are intact  EKG- Vent. rate 59 BPM PR interval 226 ms QRS duration 118 ms QT/QTcB 416/411 ms P-R-T axes 19 228 16 Sinus bradycardia with 1st degree A-V block Right bundle branch block Abnormal ECG When compared with ECG of 23-Aug-2020 09:21, PREVIOUS ECG IS PRESENT Epic records reviewed  Assessment and Plan: 1. PAF S/p ablation in 2019 Maintaining SR, has some afib episodes, overall burden is low  Continue flecainide 50 mg bid  Continue diltiazem  240 mg daily  Continue eliquis with CHA2DS2VASc score of 3   2. HTN Stable   F/u here in 6 months  Butch Penny C. Santhosh Gulino, [facility] [address] [place], [place] [address] [phone]

## 2022-01-24 ENCOUNTER — Ambulatory Visit (INDEPENDENT_AMBULATORY_CARE_PROVIDER_SITE_OTHER): Payer: Commercial Managed Care - HMO | Admitting: Family Medicine

## 2022-01-24 ENCOUNTER — Encounter: Payer: Self-pay | Admitting: Family Medicine

## 2022-01-24 VITALS — BP 142/88 | HR 76 | Temp 97.4°F | Ht 71.75 in | Wt 288.2 lb

## 2022-01-24 DIAGNOSIS — G894 Chronic pain syndrome: Secondary | ICD-10-CM | POA: Diagnosis not present

## 2022-01-24 DIAGNOSIS — G4700 Insomnia, unspecified: Secondary | ICD-10-CM

## 2022-01-24 DIAGNOSIS — I10 Essential (primary) hypertension: Secondary | ICD-10-CM | POA: Diagnosis not present

## 2022-01-24 NOTE — Assessment & Plan Note (Signed)
Elevated BMI Likely contributing to knee and neck pain Discussed lifestyle modifications including exercise and diet Patient follow-up in 1 month if no improvement

## 2022-01-24 NOTE — Progress Notes (Signed)
Assessment/Plan:   Problem List Items Addressed This Visit       Cardiovascular and Mediastinum   Essential hypertension - Primary    Well-controlled on diltiazem        Other   Morbid obesity (HCC)    Elevated BMI Likely contributing to knee and neck pain Discussed lifestyle modifications including exercise and diet Patient follow-up in 1 month if no improvement      Insomnia    Likely due to chronic pain Counseled on sleep hygiene and discussed that it was safe to trial melatonin Did discuss that there are other pharmacologic options if this does not work, however did preface that these are likely did not completely solve the problem given uncontrolled chronic pain  Patient follow-up in 1 month      Other Visit Diagnoses     Chronic pain syndrome              Subjective:  HPI:  ABDULHADI STOPA is a 61 y.o. male who has HIV infection ([place]); Essential hypertension; Atrial fibrillation ([place]); Cerebral artery occlusion with cerebral infarction ([place]); VARICOCELE; HEMATOSPERMIA; INGUINAL HERNIORRHAPHIES, BILATERAL, HX OF; S/P right knee arthroscopy; Fatigue; HNP (herniated nucleus pulposus), lumbar; Encounter for long-term (current) use of medications; Shoulder pain, right; Acromioclavicular joint arthritis; Sprain of shoulder, right; Medication monitoring encounter; Subacromial bursitis of left shoulder joint; Rotator cuff syndrome of left shoulder; Acute medial meniscus tear, left, subsequent encounter; Morbid obesity ([place]); Aortic atherosclerosis ([place]); Encounter for screening for infections with predominantly sexual mode of transmission; Nonrheumatic aortic valve insufficiency; and Insomnia on their problem list..   He  has a past medical history of Anemia, Arthritis, Childhood asthma, Clotting disorder ([place]), CVA (cerebral vascular accident) ([place]) (1999 X 2), Family history of malignant neoplasm of gastrointestinal tract, Heart murmur, History of blood transfusion  (1999), History of kidney stones, HIV disease ([place]) (dx 1999), Hypertension, Paroxysmal atrial fibrillation ([place]), Right knee meniscal tear, Seizures ([place]), Skin cancer, and Sleep apnea.[name]   He presents with chief complaint of Establish Care (Patient mentioned neck and knee pain, but does see a specialist for it. He hasn't been getting 8 hours of sleep at night.) .   Patient with history of CVA, A-fib, hypertension, and hyperlipidemia.  Recently seen by cardiology.  Reports no chest pain or palpitations.  Rate and rhythm controlled on diltiazem and flecainide.  Anticoagulated on Eliquis.  Recent lipid panel shows LDL at goal in the 60s, controlled on Zetia.  Patient has chronic knee pain due to OA and chronic neck pain due to C5/C6 disc disease.he follows with [facility].  He has take oxycodone, occasionally takes ibuprofen, although admits he is not supposed to do this while on Eliquis.  Patient reports insomnia.this is a chronic issue, ongoing for past 2 years.  Patient says that he sleeps in a recliner.  He wakes up around 2 to 3 AM due to pain.pain is particularly worse with with movement.  States that he feels tired during the day, and will need to take naps.  Reports he has had multiple sleep studies that have shown apnea.  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  Patient has history of obesity.  Reports that he had about 30 to 40 pounds of weight gain since retiring.  Does report that he has tried to go to the gym bu it is difficult due to  pain and feeling tired.  Past Surgical History:  Procedure Laterality Date   APPENDECTOMY  06-09-2003   ATRIAL FIBRILLATION ABLATION  05/28/2018   ATRIAL FIBRILLATION ABLATION N/A 05/28/2018   Procedure: ATRIAL FIBRILLATION ABLATION;  Surgeon: Briscoe Daniello Grayer, MD;  Location: [facility];  Service: Cardiovascular;  Laterality: N/A;   BACK  SURGERY     INGUINAL HERNIA REPAIR Bilateral 1990s   KNEE ARTHROSCOPY WITH MEDIAL MENISECTOMY Right 12/31/2013   Procedure: RIGHT KNEE ARTHROSCOPY PARTIAL MEDIAL MENISCECTOMY DEBRIDEMENT AND CHONDROPLASTY ;  Surgeon: Sydnee Cabal, MD;  Location: [facility];  Service: Orthopedics;  Laterality: Right;   KNEE ARTHROSCOPY WITH MEDIAL MENISECTOMY Left 01/27/2019   Procedure: LEFT KNEE ARTHROSCOPY WITH PARTIAL MEDIAL MENISCECTOMY;  Surgeon: Leandrew Koyanagi, MD;  Location: [facility];  Service: Orthopedics;  Laterality: Left;   LAPAROSCOPIC CHOLECYSTECTOMY  07-08-2003   LASER ABLATION ANAL CONDYLOMA  05-18-2001   LUMBAR LAMINECTOMY/DECOMPRESSION MICRODISCECTOMY Left 03/15/2015   Procedure: Left Lumbar five- sacral one microdiskectomy;  Surgeon: Consuella Lose, MD;  Location: [facility];  Service: Neurosurgery;  Laterality: Left;  Left L5S1 microdiskectomy   NEGATIVE SLEEP STUDY  01-03-2012  in epic   SKIN CANCER EXCISION Right    "cut it off my shoulder"   TRANSTHORACIC ECHOCARDIOGRAM  12-01-2008   MODERATE LVH/  EF 55-60%/  MILD MR/  NEGATIVE BUBBLE STUDY    Outpatient Medications Prior to Visit  Medication Sig Dispense Refill   abacavir-dolutegravir-lamiVUDine (TRIUMEQ) 600-50-300 MG tablet Take 1 tablet by mouth daily. 30 tablet 11   apixaban (ELIQUIS) 5 MG TABS tablet Take 1 tablet (5 mg total) by mouth 2 (two) times daily. 180 tablet 1   diltiazem (CARDIZEM CD) 240 MG 24 hr capsule TAKE 1 CAPSULE BY MOUTH EVERY DAY 90 capsule 3   diltiazem (CARDIZEM) 30 MG tablet Take 1 tablet every 4 hours AS NEEDED for afib heart rate >100 30 tablet 1   ezetimibe (ZETIA) 10 MG tablet Take 1 tablet (10 mg total) by mouth daily. 90 tablet 3   flecainide (TAMBOCOR) 50 MG tablet TAKE 1 TABLET BY MOUTH TWICE A DAY 180 tablet 2   oxyCODONE (OXY IR/ROXICODONE) 5 MG immediate release tablet Take 5 mg by mouth as needed.     No facility-administered medications prior to visit.     Family History  Problem Relation Age of Onset   Arrhythmia Father    Melanoma Father    Heart attack Brother    Prostate cancer Maternal Grandfather    Colon cancer Maternal Grandfather    Lung cancer Mother        smoker   Hypertension Mother    Hyperlipidemia Mother    Other Maternal Grandmother        harding of the arteries   Rectal cancer Neg Hx    Stomach cancer Neg Hx     Social History   Socioeconomic History   Marital status: Married    Spouse name: Not on file   Number of children: 1   Years of education: Not on file   Highest education level: Not on file  Occupational History   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   Social Determinants of Radio broadcast assistant Strain: Not on file  Food Insecurity: Not on file  Transportation Needs: Not on file  Physical Activity: Not on file  Stress: Not on file  Social Connections: Not on file  Intimate Partner Violence: Not on file                                                                                                 Objective:  Physical Exam: BP (!) 142/88 (BP Location: Left Arm, Patient Position: Sitting, Cuff Size: Large)   Pulse 76   Temp (!) 97.4 F (36.3 C) (Temporal)   Ht 5' 11.75" (1.822 m)   Wt 288 lb 3.2 oz (130.7 kg)   SpO2 98%   BMI 39.36 kg/m    General: No acute distress. Awake and conversant.  Eyes: Normal conjunctiva, anicteric. Round symmetric pupils.  ENT:  Hearing grossly intact. No nasal discharge.  Neck: Neck is supple. No masses or thyromegaly.  Respiratory: Respirations are non-labored. No auditory wheezing.  Skin: Warm. No rashes or ulcers.  Psych: Alert and oriented. Cooperative, Appropriate mood and affect, Normal judgment.  CV: No cyanosis or JVD MSK: Normal ambulation. No clubbing  Neuro: Sensation and CN II-XII grossly normal.        Alesia Banda, MD, MS

## 2022-01-24 NOTE — Assessment & Plan Note (Signed)
Likely due to chronic pain Counseled on sleep hygiene and discussed that it was safe to trial melatonin Did discuss that there are other pharmacologic options if this does not work, however did preface that these are likely did not completely solve the problem given uncontrolled chronic pain  Patient follow-up in 1 month

## 2022-01-24 NOTE — Assessment & Plan Note (Signed)
Well-controlled on diltiazem. 

## 2022-02-08 ENCOUNTER — Other Ambulatory Visit: Payer: Commercial Managed Care - HMO

## 2022-02-08 DIAGNOSIS — I700 Atherosclerosis of aorta: Secondary | ICD-10-CM

## 2022-02-08 LAB — LIPID PANEL
Chol/HDL Ratio: 5.4 ratio — ABNORMAL HIGH (ref 0.0–5.0)
Cholesterol, Total: 152 mg/dL (ref 100–199)
HDL: 28 mg/dL — ABNORMAL LOW (ref >39)
LDL Chol Calc (NIH): 85 mg/dL (ref 0–99)
Triglycerides: 233 mg/dL — ABNORMAL HIGH (ref 0–149)
VLDL Cholesterol Cal: 39 mg/dL (ref 5–40)

## 2022-02-15 ENCOUNTER — Telehealth: Payer: Self-pay

## 2022-02-15 DIAGNOSIS — E785 Hyperlipidemia, unspecified: Secondary | ICD-10-CM

## 2022-02-15 DIAGNOSIS — I700 Atherosclerosis of aorta: Secondary | ICD-10-CM

## 2022-02-15 NOTE — Telephone Encounter (Signed)
-----   Message from Werner Lean, MD sent at 02/09/2022  3:07 PM EDT ----- Results: LDL has improved but above goal TGS improved but above goal Plan: Offer repeat lipid clinic visit  Werner Lean, MD

## 2022-02-15 NOTE — Telephone Encounter (Signed)
The patient has been notified of the result and verbalized understanding.  All questions (if any) were answered. Bedelia Person Marrissa Dai, RN 02/15/2022 2:17 PM   Pt reports has started diet and exercise to help lower cholesterol. Is agreeable to meet with Lipid clinic order placed.

## 2022-04-04 ENCOUNTER — Telehealth: Payer: Self-pay | Admitting: Pharmacist

## 2022-04-04 ENCOUNTER — Ambulatory Visit: Payer: Commercial Managed Care - HMO | Attending: Cardiology | Admitting: Pharmacist

## 2022-04-04 DIAGNOSIS — E785 Hyperlipidemia, unspecified: Secondary | ICD-10-CM | POA: Diagnosis not present

## 2022-04-04 DIAGNOSIS — I6350 Cerebral infarction due to unspecified occlusion or stenosis of unspecified cerebral artery: Secondary | ICD-10-CM

## 2022-04-04 NOTE — Assessment & Plan Note (Signed)
Assessment:   Patient intolerant to rosuvastatin, even at low doses  TG are elevated  Diet needs improvement  Patient just recently started exercising again, confident he can continue with this  Eats something sweet everyday  Discussed how important food choices and exercise are to CV risk reduction, but with his known CAD medications can be an added CV risk reduction benefit  Only has tried one statin, but at varying doses  Plan:   Discussed trial of another statin vs PCSK9i  Patient prefers to try PCKS9i  Injection technique, cost and side effects reviewed  Patient aware that we need to submit approval to his insurance and they may require trial of another statin  I will submit PA for Repatha and will call patient once determination is made  Continue ezetimibe '10mg'$  daily  Continue lifestyle interventions. I provided patient with some educational pod casts that I think he may find helpful and motivational.

## 2022-04-04 NOTE — Progress Notes (Signed)
Patient ID: Ethan Lambert                 DOB: 1961-02-14                    MRN: [id number]      HPI: Ethan Lambert is a 61 y.o. male patient referred to lipid clinic by Dr. Gasper Sells. PMH is significant for PAF s/p ablation and flecainide, PIP, CHADSVASC 3 on Eliquis, HTN, 2022: CCTA consistent with non-obstructive disease, CVA (1999), HIV. Patient previously did not tolerate rosvuastatin, even at lower (5 and '10mg'$  dose) due to muscle pains. Was started on ezetimibe '10mg'$  daily in May 2023. LDL-C remained above goal of <70.   Patient presents today to lipid clinic. Tolerating ezetimibe '10mg'$  daily fine. Knows he needs to lose weight and improve his diet. His partner does not tend to cook very healthy even though he knows how to. Started going to the gym 3-4 times a week. Has been riding the recumbent bike for 10 min each time. Goal is to increase to 20 min and then 30 min. Hopes to eventually also go daily. Cannot walk much due to pain in his knees. No longer working, applying for disability.   Current Medications: ezetimibe '10mg'$  daily Intolerances: rosuvastatin '20mg'$ , rosuvastatin '10mg'$ , rosuvastatin '5mg'$  daily (muscle pains) Risk Factors: CAC 301 (86th percentile) , CVA (related to PFO) LDL goal: <70 ApoB goal: <80  Diet:  Breakfast: cereal or oatmeal or eggs/bacon/toast, biscuit and gravy Lunch: leftovers or [place] sandwich or goes out to eat often (fast food) Dinner: husband cooks- sausage peppers onions, white sauces, pot roast, deviled eggs, spagetti Drink: water, occasional soft drink, black coffee Snacks: something sweet everyday    Exercise: starting riding bike 3-4 days a week (10 min)- hopes to increase to daily  Family History: The patient's family history includes Arrhythmia in his father; Colon cancer in his maternal grandfather; Heart attack in his brother; Hyperlipidemia in his mother; Hypertension in his mother; Lung cancer in his mother; Melanoma in his father; Other  in his maternal grandmother; Prostate cancer in his maternal grandfather. There is no history of Rectal cancer or Stomach cancer.   History of coronary artery disease notable for two brothers in their six. History of heart failure notable for no membrs. History of arrhythmia notable for father having A fib. Denies family history of sudden cardiac death including drowning, car accidents, or unexplained deaths in the family. No history of bicuspid aortic valve or aortic aneurysm or dissection.    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   Social Determinants  of Health   Financial Resource Strain: Not on file  Food Insecurity: Not on file  Transportation Needs: Not on file  Physical Activity: Not on file  Stress: Not on file  Social Connections: Not on file  Intimate Partner Violence: Not on file     Labs: 02/08/22 TC 152, TG 233, HDL 28, LDL-C 85 (ezetimibe '10mg'$  daily)  Past Medical History:  Diagnosis Date   Anemia    Arthritis    "probably right ring finger joint" (05/28/2018)   Childhood asthma    as child none now   Clotting disorder ([place])    CVA (cerebral vascular accident) ([place]) 1999 X 2   LEFT FRONTAL & OCCIPITAL ,  NON-HEMORRHAGIC; no residual   Family history of malignant neoplasm of gastrointestinal tract    Heart murmur    History of blood transfusion 1999   "related to the stroke"   History of kidney stones    HIV disease ([place]) dx 1999   MONITORED BY INFECTIOUS DISEASE -- DR [place]   Hypertension    Paroxysmal atrial fibrillation ([place])    CARDIOLOGIST--  DR Rayann Heman   Right knee meniscal tear    Seizures ([place])    1999   Skin cancer    skin   Sleep apnea    "didn't follow thru w/mask" (05/28/2018)    Current Outpatient Medications on File Prior to Visit  Medication Sig Dispense Refill   abacavir-dolutegravir-lamiVUDine (TRIUMEQ) 600-50-300 MG tablet Take 1 tablet by mouth daily. 30 tablet 11   apixaban (ELIQUIS) 5 MG TABS tablet Take 1 tablet (5 mg total) by mouth 2 (two) times daily. 180 tablet 1   diltiazem (CARDIZEM CD) 240 MG 24 hr capsule TAKE 1 CAPSULE BY MOUTH EVERY DAY 90 capsule 3   diltiazem (CARDIZEM) 30 MG tablet Take 1 tablet every 4 hours AS NEEDED for afib heart rate >100 30 tablet 1   ezetimibe (ZETIA) 10 MG tablet Take 1 tablet (10 mg total) by mouth daily. 90 tablet 3   flecainide (TAMBOCOR) 50 MG tablet TAKE 1 TABLET BY MOUTH TWICE A DAY 180 tablet 2   oxyCODONE (OXY IR/ROXICODONE) 5 MG immediate release tablet Take 5 mg by mouth as needed.     No current facility-administered medications on file prior to visit.    Allergies  Allergen Reactions   Morphine Other (See Comments)    hallucinations    Assessment/Plan:  Hyperlipidemia Assessment:  Patient intolerant to rosuvastatin, even at low doses TG are elevated Diet needs improvement Patient just recently started exercising again, confident he can continue with this Eats something sweet everyday Discussed how important food choices and exercise are to CV risk reduction, but with his known CAD medications can be an added CV risk reduction benefit Only has tried one statin, but at varying  doses  Plan:  Discussed trial of another statin vs PCSK9i Patient prefers to try PCKS9i Injection technique, cost and side effects reviewed Patient aware that we need to submit approval to his insurance and they may require trial of another statin I will submit PA for Repatha and will call patient once determination is made Continue ezetimibe '10mg'$  daily Continue lifestyle interventions. I provided patient with some educational pod casts that I think he may find helpful and motivational.   Thank you,   Ramond Dial, Pharm.D, BCPS, CPP [facility] of [facility] [address], [place], [place] [address]  Phone: [phone]; Fax: [phone]     

## 2022-04-04 NOTE — Telephone Encounter (Signed)
PA for Repatha submitted to insurance (Key: [id number])

## 2022-04-04 NOTE — Patient Instructions (Signed)
[facility]  I will submit a prior authorization for [place] I will call you once I hear back Please call me at [phone] with any questions  Tips for living a healthier life     Building a Healthy and Balanced Diet Make most of your meal vegetables and fruits -  of your plate. Aim for color and variety, and remember that potatoes don't count as vegetables on the Healthy Eating Plate because of their negative impact on blood sugar.  Go for whole grains -  of your plate. Whole and intact grains--whole wheat, barley, wheat berries, quinoa, oats, brown rice, and foods made with them, such as whole wheat pasta--have a milder effect on blood sugar and insulin than white bread, white rice, and other refined grains.  Protein power -  of your plate. Fish, poultry, beans, and nuts are all healthy, versatile protein sources--they can be mixed into salads, and pair well with vegetables on a plate. Limit red meat, and avoid processed meats such as bacon and sausage.  Healthy plant oils - in moderation. Choose healthy vegetable oils like olive, canola, soy, corn, sunflower, peanut, and others, and avoid partially hydrogenated oils, which contain unhealthy trans fats. Remember that low-fat does not mean "healthy."  Drink water, coffee, or tea. Skip sugary drinks, limit milk and dairy products to one to two servings per day, and limit juice to a small glass per day.  Stay active. The red figure running across the [place] is a reminder that staying active is also important in weight control.  The main message of the Healthy Eating Plate is to focus on diet quality:  The type of carbohydrate in the diet is more important than the amount of carbohydrate in the diet, because some sources of carbohydrate--like vegetables (other than potatoes), fruits, whole grains, and beans--are healthier  than others. The Healthy Eating Plate also advises consumers to avoid sugary beverages, a major source of calories--usually with little nutritional value--in the American diet. The Healthy Eating Plate encourages consumers to use healthy oils, and it does not set a maximum on the percentage of calories people should get each day from healthy sources of fat. In this way, the Healthy Eating Plate recommends the opposite of the low-fat message promoted for decades by the USDA.  DeskDistributor.no  SUGAR  Sugar is a huge problem in the modern day diet. Sugar is a big contributor to heart disease, diabetes, high triglyceride levels, fatty liver disease and obesity. Sugar is hidden in almost all packaged foods/beverages. Added sugar is extra sugar that is added beyond what is naturally found and has no nutritional benefit for your body. The American Heart Association recommends limiting added sugars to no more than 25g for women and 36 grams for men per day. There are many names for sugar including maltose, sucrose (names ending in "ose"), high fructose corn syrup, molasses, cane sugar, corn sweetener, raw sugar, syrup, honey or fruit juice concentrate.   One of the best ways to limit your added sugars is to stop drinking sweetened beverages such as soda, sweet tea, and fruit juice.  There is 65g of added sugars in one 20oz bottle of Coke! That is equal to 7.5 donuts.   Pay attention and read all nutrition facts labels. Below is an examples of a nutrition facts label. The #1 is showing you the total sugars where the # 2 is showing you the  added sugars. This one serving has almost the max amount of added sugars per day!     20 oz Soda 65g Sugar = 7.5 Glazed Donuts  16oz Energy  Drink 54g Sugar = 6.5 Glazed Donuts  Large Sweet  Tea 38g Sugar = 4 Glazed Donuts  20oz Sports  Drink 34g Sugar = 3.5 Glazed Donuts  8oz Chocolate Milk 24g Sugar =2.5  Glazed Donuts  8oz Orange  Juice 21g Sugar = 2 Glazed Donuts  1 Juice Box 14g Sugar = 1.5 Glazed Donuts  16oz Water= NO SUGAR!!  EXERCISE  Exercise is good. We've all heard that. In an ideal world, we would all have time and resources to get plenty of it. When you are active, your heart pumps more efficiently and you will feel better.  Multiple studies show that even walking regularly has benefits that include living a longer life. The American Heart Association recommends 150 minutes per week of exercise (30 minutes per day most days of the week). You can do this in any increment you wish. Nine or more 10-minute walks count. So does an hour-long exercise class. Break the time apart into what will work in your life. Some of the best things you can do include walking briskly, jogging, cycling or swimming laps. Not everyone is ready to "exercise." Sometimes we need to start with just getting active. Here are some easy ways to be more active throughout the day:  Take the stairs instead of the elevator  Go for a 10-15 minute walk during your lunch break (find a friend to make it more enjoyable)  When shopping, park at the back of the parking lot  If you take public transportation, get off one stop early and walk the extra distance  Pace around while making phone calls  Check with your doctor if you aren't sure what your limitations may be. Always remember to drink plenty of water when doing any type of exercise. Don't feel like a failure if you're not getting the 90-150 minutes per week. If you started by being a couch potato, then just a 10-minute walk each day is a huge improvement. Start with little victories and work your way up.   HEALTHY EATING TIPS  When looking to improve your eating habits, whether to lose weight, lower blood pressure or just be healthier, it helps to know what a serving size is.   Grains 1 slice of bread,  bagel,  cup pasta or rice  Vegetables 1 cup fresh or raw  vegetables,  cup cooked or canned Fruits 1 piece of medium sized fruit,  cup canned,   Meats/Proteins  cup dried       1 oz meat, 1 egg,  cup cooked beans, nuts or seeds  Dairy        Fats Individual yogurt container, 1 cup (8oz)    1 teaspoon margarine/butter or vegetable  milk or milk alternative, 1 slice of cheese          oil; 1 tablespoon mayonnaise or salad dressing                  Plan ahead: make a menu of the meals for a week then create a grocery list to go with that menu. Consider meals that easily stretch into a night of leftovers, such as stews or casseroles. Or consider making two of your favorite meal and put one in the freezer for another night. Try a night or two each week that  is "meatless" or "no cook" such as salads. When you get home from the grocery store wash and prepare your vegetables and fruits. Then when you need them they are ready to go.   Tips for going to the grocery store:  [place] store or generic brands  Check the weekly ad from your store on-line or in their in-store flyer  Look at the unit price on the shelf tag to compare/contrast the costs of different items  Buy fruits/vegetables in season  Carrots, bananas and apples are low-cost, naturally healthy items  If meats or frozen vegetables are on sale, buy some extras and put in your freezer  Limit buying prepared or "ready to eat" items, even if they are pre-made salads or fruit snacks  Do not shop when you're hungry  Foods at eye level tend to be more expensive. Look on the high and low shelves for deals.  Consider shopping at the farmer's market for fresh foods in season.  Avoid the cookie and chip aisles (these are expensive, high in calories and low in nutritional value). Shop on the outside of the grocery store.  Healthy food preparations:  If you can't get lean hamburger, be sure to drain the fat when cooking  Steam, saut (in olive oil), grill or bake foods  Experiment with different seasonings  to avoid adding salt to your foods. Kosher salt, sea salt and Himalayan salt are all still salt and should be avoided. Try seasoning food with onion, garlic, thyme, rosemary, basil ect. Onion powder or garlic powder is ok. Avoid if it says salt (ie garlic salt).

## 2022-04-09 NOTE — Telephone Encounter (Signed)
Called [employer] to follow up on PA. States that waiting for medical director to make a decision.

## 2022-04-11 ENCOUNTER — Encounter: Payer: Self-pay | Admitting: Family Medicine

## 2022-04-11 ENCOUNTER — Ambulatory Visit (INDEPENDENT_AMBULATORY_CARE_PROVIDER_SITE_OTHER): Payer: Commercial Managed Care - HMO | Admitting: Family Medicine

## 2022-04-11 VITALS — BP 134/84 | HR 71 | Temp 99.2°F | Wt 288.6 lb

## 2022-04-11 DIAGNOSIS — U099 Post covid-19 condition, unspecified: Secondary | ICD-10-CM | POA: Diagnosis not present

## 2022-04-11 DIAGNOSIS — R0600 Dyspnea, unspecified: Secondary | ICD-10-CM

## 2022-04-11 DIAGNOSIS — I480 Paroxysmal atrial fibrillation: Secondary | ICD-10-CM | POA: Diagnosis not present

## 2022-04-11 DIAGNOSIS — R062 Wheezing: Secondary | ICD-10-CM | POA: Diagnosis not present

## 2022-04-11 DIAGNOSIS — R002 Palpitations: Secondary | ICD-10-CM

## 2022-04-11 DIAGNOSIS — R0609 Other forms of dyspnea: Secondary | ICD-10-CM

## 2022-04-11 LAB — COMPREHENSIVE METABOLIC PANEL
ALT: 42 U/L (ref 0–53)
AST: 43 U/L — ABNORMAL HIGH (ref 0–37)
Albumin: 4.5 g/dL (ref 3.5–5.2)
Alkaline Phosphatase: 75 U/L (ref 39–117)
BUN: 15 mg/dL (ref 6–23)
CO2: 26 mEq/L (ref 19–32)
Calcium: 9.7 mg/dL (ref 8.4–10.5)
Chloride: 102 mEq/L (ref 96–112)
Creatinine, Ser: 0.98 mg/dL (ref 0.40–1.50)
GFR: 83.36 mL/min (ref >60.00)
Glucose, Bld: 169 mg/dL — ABNORMAL HIGH (ref 70–99)
Potassium: 3.6 mEq/L (ref 3.5–5.1)
Sodium: 135 mEq/L (ref 135–145)
Total Bilirubin: 0.8 mg/dL (ref 0.2–1.2)
Total Protein: 8.3 g/dL (ref 6.0–8.3)

## 2022-04-11 LAB — CBC WITH DIFFERENTIAL/PLATELET
Basophils Absolute: 0.1 10*3/uL (ref 0.0–0.1)
Basophils Relative: 1 % (ref 0.0–3.0)
Eosinophils Absolute: 0.2 10*3/uL (ref 0.0–0.7)
Eosinophils Relative: 3 % (ref 0.0–5.0)
HCT: 46.1 % (ref 39.0–52.0)
Hemoglobin: 15.2 g/dL (ref 13.0–17.0)
Lymphocytes Relative: 26.1 % (ref 12.0–46.0)
Lymphs Abs: 2 10*3/uL (ref 0.7–4.0)
MCHC: 32.9 g/dL (ref 30.0–36.0)
MCV: 92.6 fl (ref 78.0–100.0)
Monocytes Absolute: 0.7 10*3/uL (ref 0.1–1.0)
Monocytes Relative: 9.1 % (ref 3.0–12.0)
Neutro Abs: 4.6 10*3/uL (ref 1.4–7.7)
Neutrophils Relative %: 60.8 % (ref 43.0–77.0)
Platelets: 258 10*3/uL (ref 150.0–400.0)
RBC: 4.98 Mil/uL (ref 4.22–5.81)
RDW: 14.3 % (ref 11.5–15.5)
WBC: 7.5 10*3/uL (ref 4.0–10.5)

## 2022-04-11 LAB — TROPONIN I (HIGH SENSITIVITY): High Sens Troponin I: 10 ng/L (ref 2–17)

## 2022-04-11 LAB — BRAIN NATRIURETIC PEPTIDE: Pro B Natriuretic peptide (BNP): 12 pg/mL (ref 0.0–100.0)

## 2022-04-11 MED ORDER — PREDNISONE 50 MG PO TABS: ORAL_TABLET | ORAL | 0 refills | Status: DC

## 2022-04-11 MED ORDER — METHYLPREDNISOLONE SODIUM SUCC 40 MG IJ SOLR
40.0000 mg | Freq: Once | INTRAMUSCULAR | Status: AC
  Administered 2022-04-11: 40 mg via INTRAMUSCULAR

## 2022-04-11 MED ORDER — PREDNISONE 50 MG PO TABS: 50.0000 mg | ORAL_TABLET | Freq: Every day | ORAL | 0 refills | Status: AC

## 2022-04-11 MED ORDER — SODIUM CHLORIDE 0.9 % IV SOLN: 40.0000 mg | Freq: Once | INTRAVENOUS | Status: DC

## 2022-04-11 MED ORDER — ALBUTEROL SULFATE HFA 108 (90 BASE) MCG/ACT IN AERS: 2.0000 | INHALATION_SPRAY | Freq: Four times a day (QID) | RESPIRATORY_TRACT | 0 refills | Status: DC | PRN

## 2022-04-11 NOTE — Progress Notes (Signed)
Assessment/Plan:   Problem List Items Addressed This Visit       Cardiovascular and Mediastinum   Atrial fibrillation (HCC)   Relevant Medications   predniSONE (DELTASONE) 50 MG tablet   Other Relevant Orders   EKG 12-Lead (Completed)   Comp Met (CMET) (Completed)   Troponin I   CBC w/Diff (Completed)   CT CHEST W CONTRAST   B Nat Peptide (Completed)   Ambulatory referral to Cardiology   Troponin I (High Sensitivity) (Completed)   Other Visit Diagnoses     Dyspnea on exertion    -  Primary   Relevant Medications   albuterol (VENTOLIN HFA) 108 (90 Base) MCG/ACT inhaler   predniSONE (DELTASONE) 50 MG tablet   methylPREDNISolone sodium succinate (SOLU-MEDROL) 40 mg/mL injection 40 mg (Completed)   Other Relevant Orders   EKG 12-Lead (Completed)   Comp Met (CMET) (Completed)   Troponin I   CBC w/Diff (Completed)   CT CHEST W CONTRAST   B Nat Peptide (Completed)   Ambulatory referral to Cardiology   Troponin I (High Sensitivity) (Completed)   Palpitations       Relevant Medications   albuterol (VENTOLIN HFA) 108 (90 Base) MCG/ACT inhaler   predniSONE (DELTASONE) 50 MG tablet   methylPREDNISolone sodium succinate (SOLU-MEDROL) 40 mg/mL injection 40 mg (Completed)   Other Relevant Orders   EKG 12-Lead (Completed)   Comp Met (CMET) (Completed)   Troponin I   CBC w/Diff (Completed)   CT CHEST W CONTRAST   B Nat Peptide (Completed)   Ambulatory referral to Cardiology   Troponin I (High Sensitivity) (Completed)   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       Relevant Medications   albuterol (VENTOLIN HFA) 108 (90 Base) MCG/ACT inhaler   predniSONE (DELTASONE) 50 MG tablet   methylPREDNISolone sodium succinate (SOLU-MEDROL) 40 mg/mL injection 40 mg (Completed)   Other Relevant Orders   EKG 12-Lead (Completed)   Comp Met (CMET) (Completed)   Troponin I   CBC w/Diff (Completed)   CT CHEST W CONTRAST   B Nat Peptide (Completed)   Ambulatory referral to Cardiology    Troponin I (High Sensitivity) (Completed)   Wheezing       Relevant Medications   albuterol (VENTOLIN HFA) 108 (90 Base) MCG/ACT inhaler   predniSONE (DELTASONE) 50 MG tablet   methylPREDNISolone sodium succinate (SOLU-MEDROL) 40 mg/mL injection 40 mg (Completed)   Other Relevant Orders   EKG 12-Lead (Completed)   Comp Met (CMET) (Completed)   Troponin I   CBC w/Diff (Completed)   CT CHEST W CONTRAST   B Nat Peptide (Completed)   Ambulatory referral to Cardiology   Troponin I (High Sensitivity) (Completed)      Patient has significant wheezing, chest pain, palpitations and cough status post COVID.  Also has a history of atrial fibrillation.  Although vitals are stable, symptoms and history concerning for reactive airway disease, possible pneumonia, or possible cardiac pathology such as heart failure, pericarditis. Will treat with steroids and order stat imaging and labs/above We will try to get patient evaluated urgently by his cardiologist ED and return precautions discussed   Subjective:  HPI:  MITCHAEL LUCKEY is a 61 y.o. male who has HIV infection ([place]); Essential hypertension; Atrial fibrillation ([place]); Cerebral artery occlusion with cerebral infarction ([place]); VARICOCELE; HEMATOSPERMIA; INGUINAL HERNIORRHAPHIES, BILATERAL, HX OF; S/P right knee arthroscopy; Fatigue; HNP (herniated nucleus pulposus), lumbar; Encounter for long-term (current) use of medications; Shoulder pain, right; Acromioclavicular joint arthritis; Sprain of shoulder, right;  Medication monitoring encounter; Subacromial bursitis of left shoulder joint; Rotator cuff syndrome of left shoulder; Acute medial meniscus tear, left, subsequent encounter; Morbid obesity ([place]); Hyperlipidemia; Aortic atherosclerosis ([place]); Encounter for screening for infections with predominantly sexual mode of transmission; Nonrheumatic aortic valve insufficiency; and Insomnia on their problem list..   He  has a past medical history of  Anemia, Arthritis, Childhood asthma, Clotting disorder ([place]), CVA (cerebral vascular accident) ([place]) (1999 X 2), Family history of malignant neoplasm of gastrointestinal tract, Heart murmur, History of blood transfusion (1999), History of kidney stones, HIV disease ([place]) (dx 1999), Hypertension, Paroxysmal atrial fibrillation ([place]), Right knee meniscal tear, Seizures ([place]), Skin cancer, and Sleep apnea.[name]   He presents with chief complaint of Cough (Cough and sob x 2 weeks. Denies fever, body aches, or chills. Covid positive 6 weeks ago. Took a test at home that resulted negative x 1 week ago. ) .   Patient presents for assessment of cough.  The cough is nonproductive patient had COVID approximately 6 weeks ago.  He has had worsening chest pain shortness of breath since then, particularly over the past few weeks.  Also endorses some palpitations that are worse with coughing.  He has tried OTC DayQuil and ibuprofen for cough and generalized discomfort.  He denies any fevers, chills, body aches, rash, abdominal discomfort, diarrhea.   Past Surgical History:  Procedure Laterality Date   APPENDECTOMY  06-09-2003   ATRIAL FIBRILLATION ABLATION  05/28/2018   ATRIAL FIBRILLATION ABLATION N/A 05/28/2018   Procedure: ATRIAL FIBRILLATION ABLATION;  Surgeon: Shauniece Kwan Grayer, MD;  Location: [facility];  Service: Cardiovascular;  Laterality: N/A;   BACK SURGERY     INGUINAL HERNIA REPAIR Bilateral 1990s   KNEE ARTHROSCOPY WITH MEDIAL MENISECTOMY Right 12/31/2013   Procedure: RIGHT KNEE ARTHROSCOPY PARTIAL MEDIAL MENISCECTOMY DEBRIDEMENT AND CHONDROPLASTY ;  Surgeon: Sydnee Cabal, MD;  Location: [facility];  Service: Orthopedics;  Laterality: Right;   KNEE ARTHROSCOPY WITH MEDIAL MENISECTOMY Left 01/27/2019   Procedure: LEFT KNEE ARTHROSCOPY WITH PARTIAL MEDIAL MENISCECTOMY;  Surgeon: Leandrew Koyanagi, MD;  Location: [facility];  Service: Orthopedics;  Laterality: Left;    LAPAROSCOPIC CHOLECYSTECTOMY  07-08-2003   LASER ABLATION ANAL CONDYLOMA  05-18-2001   LUMBAR LAMINECTOMY/DECOMPRESSION MICRODISCECTOMY Left 03/15/2015   Procedure: Left Lumbar five- sacral one microdiskectomy;  Surgeon: Consuella Lose, MD;  Location: [facility];  Service: Neurosurgery;  Laterality: Left;  Left L5S1 microdiskectomy   NEGATIVE SLEEP STUDY  01-03-2012  in epic   SKIN CANCER EXCISION Right    "cut it off my shoulder"   TRANSTHORACIC ECHOCARDIOGRAM  12-01-2008   MODERATE LVH/  EF 55-60%/  MILD MR/  NEGATIVE BUBBLE STUDY    Outpatient Medications Prior to Visit  Medication Sig Dispense Refill   abacavir-dolutegravir-lamiVUDine (TRIUMEQ) 600-50-300 MG tablet Take 1 tablet by mouth daily. 30 tablet 11   apixaban (ELIQUIS) 5 MG TABS tablet Take 1 tablet (5 mg total) by mouth 2 (two) times daily. 180 tablet 1   diltiazem (CARDIZEM CD) 240 MG 24 hr capsule TAKE 1 CAPSULE BY MOUTH EVERY DAY 90 capsule 3   diltiazem (CARDIZEM) 30 MG tablet Take 1 tablet every 4 hours AS NEEDED for afib heart rate >100 30 tablet 1   ezetimibe (ZETIA) 10 MG tablet Take 1 tablet (10 mg total) by mouth daily. 90 tablet 3   flecainide (TAMBOCOR) 50 MG tablet TAKE 1 TABLET BY MOUTH TWICE A DAY 180 tablet 2   oxyCODONE (OXY  IR/ROXICODONE) 5 MG immediate release tablet Take 5 mg by mouth as needed.     No facility-administered medications prior to visit.    Family History  Problem Relation Age of Onset   Arrhythmia Father    Melanoma Father    Heart attack Brother    Prostate cancer Maternal Grandfather    Colon cancer Maternal Grandfather    Lung cancer Mother        smoker   Hypertension Mother    Hyperlipidemia Mother    Other Maternal Grandmother        harding of the arteries   Rectal cancer Neg Hx    Stomach cancer Neg Hx     Social History   Socioeconomic History   Marital status: Married    Spouse name: Not on file   Number of children: 1   Years of education: Not on file    Highest education level: Not on file  Occupational History   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 Awake and conversant.  Eyes: Normal conjunctiva, anicteric. Round symmetric pupils.  ENT: Hearing grossly intact. No nasal discharge.  Neck: Neck is supple. No masses or thyromegaly.  Respiratory: Respirations are non-labored.  Significant wheeze heard throughout all lung fields Skin: Warm. No rashes or ulcers.  Psych: Alert and oriented. Cooperative, Appropriate mood and affect, Normal judgment.  CV: No cyanosis or JVD, RRR, no MRG ABD: Nontender, nondistended MSK: Normal ambulation. No clubbing, no lower extremity edema Neuro: Sensation and CN II-XII grossly normal.        Alesia Banda, MD, MS

## 2022-04-11 NOTE — Patient Instructions (Addendum)
For shortness of breath and wheezing, likely due to post covid bronchitis. But with chest pain and palpitations as well as your history of heart disease, we need to make sure your heart is ok. Your EKG did not look like a heart attack. We are ordering lab work and a CT scan to assess for further issues. We would like you to follow up with your cardiologist. We will have our office contact them to try to schedule you as soon as possible.    Go to ED if you have severe chest pain, fevers, shortness of breath or other worrisome symptoms.

## 2022-04-12 ENCOUNTER — Ambulatory Visit (HOSPITAL_BASED_OUTPATIENT_CLINIC_OR_DEPARTMENT_OTHER)
Admission: RE | Admit: 2022-04-12 | Discharge: 2022-04-12 | Disposition: A | Discharge status: Home / Self Care | Payer: Commercial Managed Care - HMO | Source: Ambulatory Visit | Attending: Family Medicine | Admitting: Family Medicine

## 2022-04-12 ENCOUNTER — Ambulatory Visit: Payer: Commercial Managed Care - HMO | Admitting: Family Medicine

## 2022-04-12 DIAGNOSIS — R062 Wheezing: Secondary | ICD-10-CM | POA: Insufficient documentation

## 2022-04-12 DIAGNOSIS — I480 Paroxysmal atrial fibrillation: Secondary | ICD-10-CM | POA: Diagnosis present

## 2022-04-12 DIAGNOSIS — R0600 Dyspnea, unspecified: Secondary | ICD-10-CM | POA: Insufficient documentation

## 2022-04-12 DIAGNOSIS — R002 Palpitations: Secondary | ICD-10-CM | POA: Insufficient documentation

## 2022-04-12 DIAGNOSIS — R0609 Other forms of dyspnea: Secondary | ICD-10-CM | POA: Insufficient documentation

## 2022-04-12 DIAGNOSIS — U099 Post covid-19 condition, unspecified: Secondary | ICD-10-CM | POA: Insufficient documentation

## 2022-04-12 MED ORDER — IOHEXOL 300 MG/ML  SOLN
75.0000 mL | Freq: Once | INTRAMUSCULAR | Status: AC | PRN
  Administered 2022-04-12: 75 mL via INTRAVENOUS

## 2022-04-15 ENCOUNTER — Telehealth (INDEPENDENT_AMBULATORY_CARE_PROVIDER_SITE_OTHER): Payer: Commercial Managed Care - HMO | Admitting: Family Medicine

## 2022-04-15 DIAGNOSIS — U099 Post covid-19 condition, unspecified: Secondary | ICD-10-CM

## 2022-04-15 DIAGNOSIS — R062 Wheezing: Secondary | ICD-10-CM

## 2022-04-15 DIAGNOSIS — R0600 Dyspnea, unspecified: Secondary | ICD-10-CM | POA: Diagnosis not present

## 2022-04-15 MED ORDER — IPRATROPIUM-ALBUTEROL 0.5-2.5 (3) MG/3ML IN SOLN: 3.0000 mL | RESPIRATORY_TRACT | 0 refills | Status: DC | PRN

## 2022-04-15 MED ORDER — FLUTICASONE PROPIONATE 50 MCG/ACT NA SUSP: 2.0000 | Freq: Every day | NASAL | 6 refills | Status: DC

## 2022-04-15 MED ORDER — DOXYCYCLINE HYCLATE 100 MG PO TABS: 100.0000 mg | ORAL_TABLET | Freq: Two times a day (BID) | ORAL | 0 refills | Status: DC

## 2022-04-15 MED ORDER — NEBULIZER SYSTEM ALL-IN-ONE MISC: 1.0000 | Freq: Every day | 0 refills | Status: DC

## 2022-04-15 NOTE — Progress Notes (Signed)
Virtual Visit via Telephone Note  I connected with Ethan Lambert on 04/15/22 at  4:00 PM EDT by telephone and verified that I am speaking with the correct person using two identifiers.  Location: Patient: Home Provider: Office   I discussed the limitations, risks, security and privacy concerns of performing an evaluation and management service by telephone and the availability of in person appointments. I also discussed with the patient that there may be a patient responsible charge related to this service. The patient expressed understanding and agreed to proceed.   History of Present Illness: Chief Complaint  Patient presents with   Follow-up    Still congestion with green yellow mucus. Completed prednisone.    Post COVID dyspnea.  Patient following up for post-COVID dyspnea evaluated in clinic 04/11/2022.  At that time patient found to have significant wheezing and chest pain shortness of breath.  Patient with extensive work-up including CBC, CMP BNP, troponin, EKG, CT chest without signs of STEMI or other signs of systemic disease.  Patient was given course of steroids and albuterol inhaler.  He has upcoming follow-up with cardiology next week.  Since last seen in office patient reports intermittent wheezing, cough, and dyspnea.  He is not having any chest pain.  Reported some initial improvement last Saturday, but had progressively worsening dyspnea until this morning.  As of right now he says that he does feel improved without wheezing.  He has compliant on prednisone and is on day 5 of 5.  He is also using albuterol inhaler 2-3 times a day.  Does report some improvement with this but incomplete resolution of wheezing.  He reports that the further she could walk today was to the mailbox and back without stopping to breathe   Observations/Objective:  Respiratory: Able to speak in full sentences without having to stop, no wheezing heard on phone Assessment and Plan: Post COVID dyspnea  and wheezing Extensive work-up that was negative, although little improvement status post prednisone course and albuterol Patient with no known history of prior respiratory disease, but does have extensive cardiac disease including calcified aortic stenosis, atrial fibrillation Although no pneumonia seen on CT, there may be an occult finding Trial of doxycycline to cover for any possible atypical infection,  Trial DuoNebs plus nebulizers Spoke with patient extensively of limitation of virtual visit, and need to go to emergency department if no improvement in wheezing or progressing dyspnea  Follow Up Instructions:    I discussed the assessment and treatment plan with the patient. The patient was provided an opportunity to ask questions and all were answered. The patient agreed with the plan and demonstrated an understanding of the instructions.   The patient was advised to call back or seek an in-person evaluation if the symptoms worsen or if the condition fails to improve as anticipated.  I provided 20 minutes of non-face-to-face time during this encounter.   Bonnita Hollow, MD

## 2022-04-17 ENCOUNTER — Other Ambulatory Visit (HOSPITAL_COMMUNITY): Payer: Self-pay | Admitting: Nurse Practitioner

## 2022-04-18 ENCOUNTER — Emergency Department (HOSPITAL_BASED_OUTPATIENT_CLINIC_OR_DEPARTMENT_OTHER): Payer: Commercial Managed Care - HMO

## 2022-04-18 ENCOUNTER — Ambulatory Visit: Payer: Commercial Managed Care - HMO | Admitting: Internal Medicine

## 2022-04-18 ENCOUNTER — Other Ambulatory Visit: Payer: Self-pay

## 2022-04-18 ENCOUNTER — Encounter (HOSPITAL_COMMUNITY): Payer: Self-pay

## 2022-04-18 ENCOUNTER — Observation Stay (HOSPITAL_BASED_OUTPATIENT_CLINIC_OR_DEPARTMENT_OTHER)
Admission: EM | Admit: 2022-04-18 | Discharge: 2022-04-19 | Disposition: A | Discharge status: Home / Self Care | Payer: Commercial Managed Care - HMO | Attending: Internal Medicine | Admitting: Internal Medicine

## 2022-04-18 ENCOUNTER — Encounter (HOSPITAL_BASED_OUTPATIENT_CLINIC_OR_DEPARTMENT_OTHER): Payer: Self-pay

## 2022-04-18 DIAGNOSIS — J209 Acute bronchitis, unspecified: Secondary | ICD-10-CM | POA: Insufficient documentation

## 2022-04-18 DIAGNOSIS — R0602 Shortness of breath: Secondary | ICD-10-CM | POA: Diagnosis present

## 2022-04-18 DIAGNOSIS — Z7901 Long term (current) use of anticoagulants: Secondary | ICD-10-CM | POA: Insufficient documentation

## 2022-04-18 DIAGNOSIS — Z8673 Personal history of transient ischemic attack (TIA), and cerebral infarction without residual deficits: Secondary | ICD-10-CM | POA: Insufficient documentation

## 2022-04-18 DIAGNOSIS — Z79899 Other long term (current) drug therapy: Secondary | ICD-10-CM | POA: Diagnosis not present

## 2022-04-18 DIAGNOSIS — I10 Essential (primary) hypertension: Secondary | ICD-10-CM | POA: Diagnosis not present

## 2022-04-18 DIAGNOSIS — Z23 Encounter for immunization: Secondary | ICD-10-CM | POA: Insufficient documentation

## 2022-04-18 DIAGNOSIS — I480 Paroxysmal atrial fibrillation: Principal | ICD-10-CM | POA: Diagnosis present

## 2022-04-18 DIAGNOSIS — Z800 Family history of malignant neoplasm of digestive organs: Secondary | ICD-10-CM | POA: Diagnosis not present

## 2022-04-18 DIAGNOSIS — B20 Human immunodeficiency virus [HIV] disease: Secondary | ICD-10-CM | POA: Diagnosis present

## 2022-04-18 DIAGNOSIS — Z7951 Long term (current) use of inhaled steroids: Secondary | ICD-10-CM | POA: Insufficient documentation

## 2022-04-18 DIAGNOSIS — J40 Bronchitis, not specified as acute or chronic: Principal | ICD-10-CM

## 2022-04-18 DIAGNOSIS — Z85828 Personal history of other malignant neoplasm of skin: Secondary | ICD-10-CM | POA: Diagnosis not present

## 2022-04-18 DIAGNOSIS — I4891 Unspecified atrial fibrillation: Secondary | ICD-10-CM

## 2022-04-18 DIAGNOSIS — E785 Hyperlipidemia, unspecified: Secondary | ICD-10-CM | POA: Diagnosis present

## 2022-04-18 DIAGNOSIS — Z9889 Other specified postprocedural states: Secondary | ICD-10-CM | POA: Diagnosis not present

## 2022-04-18 DIAGNOSIS — Z21 Asymptomatic human immunodeficiency virus [HIV] infection status: Secondary | ICD-10-CM | POA: Diagnosis present

## 2022-04-18 LAB — CBC WITH DIFFERENTIAL/PLATELET
Abs Immature Granulocytes: 0.03 10*3/uL (ref 0.00–0.07)
Basophils Absolute: 0.1 10*3/uL (ref 0.0–0.1)
Basophils Relative: 1 %
Eosinophils Absolute: 0.3 10*3/uL (ref 0.0–0.5)
Eosinophils Relative: 3 %
HCT: 44.6 % (ref 39.0–52.0)
Hemoglobin: 14.7 g/dL (ref 13.0–17.0)
Immature Granulocytes: 0 %
Lymphocytes Relative: 21 %
Lymphs Abs: 2.2 10*3/uL (ref 0.7–4.0)
MCH: 30.6 pg (ref 26.0–34.0)
MCHC: 33 g/dL (ref 30.0–36.0)
MCV: 92.9 fL (ref 80.0–100.0)
Monocytes Absolute: 0.9 10*3/uL (ref 0.1–1.0)
Monocytes Relative: 9 %
Neutro Abs: 6.8 10*3/uL (ref 1.7–7.7)
Neutrophils Relative %: 66 %
Platelets: 268 10*3/uL (ref 150–400)
RBC: 4.8 MIL/uL (ref 4.22–5.81)
RDW: 13.5 % (ref 11.5–15.5)
WBC: 10.4 10*3/uL (ref 4.0–10.5)
nRBC: 0 % (ref 0.0–0.2)

## 2022-04-18 LAB — COMPREHENSIVE METABOLIC PANEL
ALT: 27 U/L (ref 0–44)
AST: 22 U/L (ref 15–41)
Albumin: 4.2 g/dL (ref 3.5–5.0)
Alkaline Phosphatase: 70 U/L (ref 38–126)
Anion gap: 10 (ref 5–15)
BUN: 15 mg/dL (ref 8–23)
CO2: 26 mmol/L (ref 22–32)
Calcium: 9.6 mg/dL (ref 8.9–10.3)
Chloride: 100 mmol/L (ref 98–111)
Creatinine, Ser: 1.03 mg/dL (ref 0.61–1.24)
GFR, Estimated: >60 mL/min (ref >60)
Glucose, Bld: 212 mg/dL — ABNORMAL HIGH (ref 70–99)
Potassium: 3.8 mmol/L (ref 3.5–5.1)
Sodium: 136 mmol/L (ref 135–145)
Total Bilirubin: 1.4 mg/dL — ABNORMAL HIGH (ref 0.3–1.2)
Total Protein: 7.5 g/dL (ref 6.5–8.1)

## 2022-04-18 LAB — PROCALCITONIN: Procalcitonin: 0.1 ng/mL

## 2022-04-18 LAB — BRAIN NATRIURETIC PEPTIDE: B Natriuretic Peptide: 10.7 pg/mL (ref 0.0–100.0)

## 2022-04-18 LAB — TROPONIN I (HIGH SENSITIVITY)
Troponin I (High Sensitivity): 7 ng/L (ref <18)
Troponin I (High Sensitivity): 12 ng/L (ref <18)

## 2022-04-18 MED ORDER — IPRATROPIUM BROMIDE 0.02 % IN SOLN
0.5000 mg | Freq: Once | RESPIRATORY_TRACT | Status: AC
  Administered 2022-04-18: 0.5 mg via RESPIRATORY_TRACT
  Filled 2022-04-18: qty 2.5

## 2022-04-18 MED ORDER — METHYLPREDNISOLONE SODIUM SUCC 125 MG IJ SOLR
125.0000 mg | Freq: Once | INTRAMUSCULAR | Status: AC
  Administered 2022-04-18: 125 mg via INTRAVENOUS
  Filled 2022-04-18: qty 2

## 2022-04-18 MED ORDER — ALBUTEROL (5 MG/ML) CONTINUOUS INHALATION SOLN
15.0000 mg/h | INHALATION_SOLUTION | Freq: Once | RESPIRATORY_TRACT | Status: AC
  Administered 2022-04-18: 15 mg/h via RESPIRATORY_TRACT
  Filled 2022-04-18: qty 20

## 2022-04-18 MED ORDER — ALBUTEROL SULFATE (2.5 MG/3ML) 0.083% IN NEBU
5.0000 mg | INHALATION_SOLUTION | Freq: Once | RESPIRATORY_TRACT | Status: AC
  Administered 2022-04-18: 5 mg via RESPIRATORY_TRACT
  Filled 2022-04-18: qty 6

## 2022-04-18 MED ORDER — DILTIAZEM HCL 25 MG/5ML IV SOLN
10.0000 mg | Freq: Once | INTRAVENOUS | Status: AC
  Administered 2022-04-18: 10 mg via INTRAVENOUS
  Filled 2022-04-18: qty 5

## 2022-04-18 MED ORDER — INFLUENZA VAC SPLIT QUAD 0.5 ML IM SUSY
0.5000 mL | PREFILLED_SYRINGE | INTRAMUSCULAR | Status: AC
  Administered 2022-04-19: 0.5 mL via INTRAMUSCULAR
  Filled 2022-04-18: qty 0.5

## 2022-04-18 MED ORDER — DILTIAZEM HCL-DEXTROSE 125-5 MG/125ML-% IV SOLN (PREMIX)
5.0000 mg/h | INTRAVENOUS | Status: DC
  Administered 2022-04-18: 12.5 mg/h via INTRAVENOUS
  Administered 2022-04-18: 5 mg/h via INTRAVENOUS
  Filled 2022-04-18 (×2): qty 125

## 2022-04-18 MED ORDER — MAGNESIUM SULFATE 2 GM/50ML IV SOLN
2.0000 g | Freq: Once | INTRAVENOUS | Status: AC
  Administered 2022-04-18: 2 g via INTRAVENOUS
  Filled 2022-04-18: qty 50

## 2022-04-18 MED ORDER — DILTIAZEM LOAD VIA INFUSION
10.0000 mg | Freq: Once | INTRAVENOUS | Status: AC
  Administered 2022-04-18: 10 mg via INTRAVENOUS
  Filled 2022-04-18: qty 10

## 2022-04-18 NOTE — H&P (Signed)
History and Physical    Ethan Lambert VPX:[id number] DOB: 01/07/1961 DOA: 04/18/2022  PCP: Bonnita Hollow, MD  Patient coming from: Home  I have personally briefly reviewed patient's old medical records in [place]  Chief Complaint: Shortness of breath  HPI: Ethan Lambert is a 61 y.o. male with medical history significant for PAF on Eliquis and flecainide, HIV (CD4 729, RNA undetectable on 10/31/2021), history of CVA, HLD who presented to the ED for evaluation of shortness of breath.  Patient states he has been having 3 weeks of persistent cough and shortness of breath.  He has felt himself wheezing.  He had a CT chest on 04/12/2022 which was negative for focal consolidation, effusion, pneumothorax.  Mild linear opacities of the right middle lobe and lingula were seen, felt to be scarring or atelectasis.  He was started on doxycycline and a 5-day Lambert of prednisone.  He had some brief improvement in symptoms however has been having continued shortness of breath, cough, chest congestion, and new sinus congestion with rhinorrhea.  He has been using an albuterol nebulizer at home which also has provided some brief improvement.  While in the ED after he received a 1 hour long continuous albuterol nebulizer treatment he developed palpitations and was found to be in A-fib with RVR with heart rate between 140-160.  He was started on Dilt drip and transferred for further management.  Patient seen on the floor after transfer.  He is now back in a sinus rhythm.  He is feeling much better in terms of his shortness of breath and cough.  He has not had any chest pain, peripheral edema.  Ethan Lambert  Labs/Imaging on admission: I have personally reviewed following labs and imaging studies.  Initial vitals showed BP 177/86, pulse 81, RR 22, temp 98.7 F, SPO2 95% on room air.  Labs showed WBC 10.4, hemoglobin 14.7, platelets 268,000, sodium 136, potassium 3.8, bicarb  26, BUN 15, creatinine 1.03, serum glucose 212, troponin 7 > 12, procalcitonin 0.10, BNP 10.7.  Portable chest x-ray showed mild lingular scarring or atelectasis without focal consolidation, edema, effusion.  Patient was given IV Solu-Medrol 125 mg, IV magnesium 2 g, albuterol and Atrovent nebulizer x2 followed by continuous albuterol neb.  Patient subsequently developed A-fib with RVR with a rate up to 140s.  He was given IV diltiazem 10 mg x 2 followed by continuous infusion.  The hospitalist service was consulted to admit for further evaluation and management.  Review of Systems: All systems reviewed and are negative except as documented in history of present illness above.   Past Medical History:  Diagnosis Date   Anemia    Arthritis    "probably right ring finger joint" (05/28/2018)   Childhood asthma    as child none now   Clotting disorder ([place])    CVA (cerebral vascular accident) ([place]) 1999 X 2   LEFT FRONTAL & OCCIPITAL , NON-HEMORRHAGIC; no residual   Family history of malignant neoplasm of gastrointestinal tract    Heart murmur    History of blood transfusion 1999   "related to the stroke"   History of kidney stones    HIV disease ([place]) dx 1999   MONITORED BY INFECTIOUS DISEASE -- DR Herbie Baltimore COMER   Hypertension    Paroxysmal atrial fibrillation ([place])    CARDIOLOGIST--  DR Rayann Heman   Right knee meniscal tear    Seizures ([place])    1999   Skin cancer  skin   Sleep apnea    "didn't follow thru w/mask" (05/28/2018)    Past Surgical History:  Procedure Laterality Date   APPENDECTOMY  06-09-2003   ATRIAL FIBRILLATION ABLATION  05/28/2018   ATRIAL FIBRILLATION ABLATION N/A 05/28/2018   Procedure: ATRIAL FIBRILLATION ABLATION;  Surgeon: Thompson Grayer, MD;  Location: [facility];  Service: Cardiovascular;  Laterality: N/A;   BACK SURGERY     INGUINAL HERNIA REPAIR Bilateral 1990s   KNEE ARTHROSCOPY WITH MEDIAL MENISECTOMY Right 12/31/2013   Procedure: RIGHT KNEE  ARTHROSCOPY PARTIAL MEDIAL MENISCECTOMY DEBRIDEMENT AND CHONDROPLASTY ;  Surgeon: Sydnee Cabal, MD;  Location: [facility];  Service: Orthopedics;  Laterality: Right;   KNEE ARTHROSCOPY WITH MEDIAL MENISECTOMY Left 01/27/2019   Procedure: LEFT KNEE ARTHROSCOPY WITH PARTIAL MEDIAL MENISCECTOMY;  Surgeon: Leandrew Koyanagi, MD;  Location: [facility];  Service: Orthopedics;  Laterality: Left;   LAPAROSCOPIC CHOLECYSTECTOMY  07-08-2003   LASER ABLATION ANAL CONDYLOMA  05-18-2001   LUMBAR LAMINECTOMY/DECOMPRESSION MICRODISCECTOMY Left 03/15/2015   Procedure: Left Lumbar five- sacral one microdiskectomy;  Surgeon: Consuella Lose, MD;  Location: [facility];  Service: Neurosurgery;  Laterality: Left;  Left L5S1 microdiskectomy   NEGATIVE SLEEP STUDY  01-03-2012  in epic   SKIN CANCER EXCISION Right    "cut it off my shoulder"   TRANSTHORACIC ECHOCARDIOGRAM  12-01-2008   MODERATE LVH/  EF 55-60%/  MILD MR/  NEGATIVE BUBBLE STUDY    Social History:  reports that he has never smoked. He has never been exposed to tobacco smoke. He has never used smokeless tobacco. He reports current alcohol use. He reports that he does not use drugs.  Allergies  Allergen Reactions   Morphine Other (See Comments)    hallucinations    Family History  Problem Relation Age of Onset   Arrhythmia Father    Melanoma Father    Heart attack Brother    Prostate cancer Maternal Grandfather    Colon cancer Maternal Grandfather    Lung cancer Mother        smoker   Hypertension Mother    Hyperlipidemia Mother    Other Maternal Grandmother        harding of the arteries   Rectal cancer Neg Hx    Stomach cancer Neg Hx      Prior to Admission medications   Medication Sig Start Date End Date Taking? Authorizing Provider  abacavir-dolutegravir-lamiVUDine (TRIUMEQ) 600-50-300 MG tablet Take 1 tablet by mouth daily. 10/31/21   Comer, Okey Regal, MD  albuterol (VENTOLIN HFA) 108 (90 Base)  MCG/ACT inhaler Inhale 2 puffs into the lungs every 6 (six) hours as needed for wheezing or shortness of breath. 04/11/22   Bonnita Hollow, MD  apixaban (ELIQUIS) 5 MG TABS tablet Take 1 tablet (5 mg total) by mouth 2 (two) times daily. 11/08/21   Chandrasekhar, Terisa Starr, MD  diltiazem (CARDIZEM CD) 240 MG 24 hr capsule TAKE 1 CAPSULE BY MOUTH EVERY DAY 06/04/21   Allred, Jeneen Rinks, MD  diltiazem (CARDIZEM) 30 MG tablet Take 1 tablet every 4 hours AS NEEDED for afib heart rate >100 08/18/20   Sherran Needs, NP  doxycycline (VIBRA-TABS) 100 MG tablet Take 1 tablet (100 mg total) by mouth 2 (two) times daily for 7 days. 04/15/22 04/22/22  Bonnita Hollow, MD  ezetimibe (ZETIA) 10 MG tablet Take 1 tablet (10 mg total) by mouth daily. 11/08/21   Werner Lean, MD  flecainide (TAMBOCOR) 50 MG tablet  TAKE 1 TABLET BY MOUTH TWICE A DAY 04/17/22   Sherran Needs, NP  fluticasone [facility]) 50 MCG/ACT nasal spray Place 2 sprays into both nostrils daily. 04/15/22   Bonnita Hollow, MD  ipratropium-albuterol (DUONEB) 0.5-2.5 (3) MG/3ML SOLN Take 3 mLs by nebulization every 4 (four) hours as needed (wheezing). 04/15/22 05/15/22  Bonnita Hollow, MD  Nebulizer System All-In-One MISC 1 each by Does not apply route daily. 04/15/22 05/15/22  Bonnita Hollow, MD  oxyCODONE (OXY IR/ROXICODONE) 5 MG immediate release tablet Take 5 mg by mouth as needed. Patient not taking: Reported on 04/15/2022    [provider]    Physical Exam: Vitals:   04/18/22 1730 04/18/22 1825 04/18/22 1955 04/18/22 2255  BP: (!) 147/83 135/85 (!) 148/86 (!) 161/76  Pulse: 96 90 78   Resp: (!) '21 18 18   '$ Temp: 98 F (36.7 C)  98.3 F (36.8 C)   TempSrc:   Oral   SpO2: 94% 94% 96%   Weight:      Height:       Constitutional: Resting in bed, NAD, calm, comfortable Eyes: EOMI, lids and conjunctivae normal ENMT: Mucous membranes are moist. Posterior pharynx clear of any exudate or lesions.Normal dentition.   Neck: normal, supple, no masses. Respiratory: clear to auscultation bilaterally, no wheezing, no crackles. Normal respiratory effort. No accessory muscle use.  Cardiovascular: Regular rate and rhythm, no murmurs / rubs / gallops. No extremity edema. 2+ pedal pulses. Abdomen: no tenderness, no masses palpated. Musculoskeletal: no clubbing / cyanosis. No joint deformity upper and lower extremities. Good ROM, no contractures. Normal muscle tone.  Skin: no rashes, lesions, ulcers. No induration Neurologic:Sensation intact. Strength 5/5 in all 4.  Psychiatric: Normal judgment and insight. Alert and oriented x 3. Normal mood.   EKG: Personally reviewed. Initial EKG showed sinus rhythm, rate 83, incomplete RBBB and LAFB.  Repeat EKG showed A-fib with RVR, rate 141, motion artifact.  Assessment/Plan Principal Problem:   Paroxysmal atrial fibrillation with RVR (HCC) Active Problems:   Bronchitis   HIV infection ([place])   Hyperlipidemia   Ethan Lambert is a 61 y.o. male with medical history significant for PAF on Eliquis and flecainide, HIV (CD4 729, RNA undetectable on 10/31/2021), history of CVA, HLD who is admitted with A-fib with RVR.  Assessment and Plan: * Paroxysmal atrial fibrillation with RVR (HCC) RVR triggered by continuous albuterol nebulizer.  He was started on IV diltiazem drip and now back in sinus rhythm with controlled rate. -Titrate off IV diltiazem gtt -Resume home flecainide 50 mg BID -Resume home diltiazem 240 mg daily -Continue Eliquis  Bronchitis History consistent with bronchitis.  Imaging without evidence of pneumonia, edema, effusion.  No wheezing on exam.  Symptomatically feeling much better. -Supportive care with Mucinex, Flonase, etc  Hyperlipidemia Continue Zetia.  HIV infection ([place]) Continue home Triumeq.  DVT prophylaxis:  apixaban (ELIQUIS) tablet 5 mg   Code Status: Full code, confirmed with patient on admission Family Communication: Discussed with  patient, he has discussed with family Disposition Plan: From home and likely discharge to home in 1 day Consults called: None Severity of Illness: The appropriate patient status for this patient is INPATIENT. Inpatient status is judged to be reasonable and necessary in order to provide the required intensity of service to ensure the patient's safety. The patient's presenting symptoms, physical exam findings, and initial radiographic and laboratory data in the context of their chronic comorbidities is felt to place them at high risk  for further clinical deterioration. Furthermore, it is not anticipated that the patient will be medically stable for discharge from the hospital within 2 midnights of admission.   * I certify that at the point of admission it is my clinical judgment that the patient will require inpatient hospital care spanning beyond 2 midnights from the point of admission due to high intensity of service, high risk for further deterioration and high frequency of surveillance required.Zada Finders MD [facility]  If 7PM-7AM, please contact night-coverage [email or web address]  04/19/2022, 1:04 AM

## 2022-04-18 NOTE — ED Notes (Signed)
Pt provided microwaveable meal, pitcher of water per his request.  EDP Tegeler aware and agreeable.

## 2022-04-18 NOTE — ED Triage Notes (Signed)
Patient reports sob cough congestion and wheezing.  Reports oxygen drops when he walks.  Reports exertion dyspnea.  Reports had CT scan on 23rd and no pneumonia.  When looking at results it reports pericardial effusion.  Reports Dr Grandville Silos put him on doxy and neb tx.

## 2022-04-18 NOTE — ED Provider Notes (Signed)
[facility] EMERGENCY DEPARTMENT Provider Note   CSN: [id number] Arrival date & time: 04/18/22  0827     History  Chief Complaint  Patient presents with   Shortness of Breath    Ethan Lambert is a 61 y.o. male.  Pt is a 61y/o male with hx of HIV infection with undetectable viral load, hypertension; Atrial fibrillation on eliquis, Cerebral artery occlusion with cerebral infarction who is presenting today with complaint of ongoing cough, orthopnea and dyspnea on exertion.  Patient reports symptoms have now been going on at least 3 to 4 weeks.  Approximately 7 weeks ago patient had COVID and initially improved but has had ongoing symptoms now.  He followed up with his doctor last week on 04/11/2022 and at that time was audibly wheezing but maintaining his oxygen saturation.  He had work-up with a chest x-ray, lab work including a troponin and BNP and then received a CT of his chest.  Lab work-up was ultimately benign with a normal EKG documented in the office and chest x-ray without significant findings.  The CT showed no evidence of pneumonia or other acute findings.  Patient had a 5-day course of prednisone and was using albuterol inhaler at home.  He reported that he really was not improving that much so spoke with his doctor over a video visit 3 days ago.  They ordered him nebulizers which he did not get filled till yesterday because he did not have the machine at home and they ordered doxycycline but he did not start the medication because his sputum was clear and he was not running fever.  After the nebulizer yesterday he did report that his breathing improved.  He has a home oxygen meter at home which he has been checking and oxygen will drop to the low 90s but then come back up to about 95.  He feels like the wheezing is somewhat improved.  The highest temperature he is noted at home was 99.  He has not noticed any swelling or distention in his abdomen.  He does not have a history  of heart failure but does have heart problems including prior ablation.  He still is having significant cough and having pain in his chest with coughing.  He has been compliant with his Eliquis.  The history is provided by the patient and medical records.  Shortness of Breath      Home Medications Prior to Admission medications   Medication Sig Start Date End Date Taking? Authorizing Provider  abacavir-dolutegravir-lamiVUDine (TRIUMEQ) 600-50-300 MG tablet Take 1 tablet by mouth daily. 10/31/21   Comer, Okey Regal, MD  albuterol (VENTOLIN HFA) 108 (90 Base) MCG/ACT inhaler Inhale 2 puffs into the lungs every 6 (six) hours as needed for wheezing or shortness of breath. 04/11/22   Bonnita Hollow, MD  apixaban (ELIQUIS) 5 MG TABS tablet Take 1 tablet (5 mg total) by mouth 2 (two) times daily. 11/08/21   Chandrasekhar, Terisa Starr, MD  diltiazem (CARDIZEM CD) 240 MG 24 hr capsule TAKE 1 CAPSULE BY MOUTH EVERY DAY 06/04/21   Allred, Jeneen Rinks, MD  diltiazem (CARDIZEM) 30 MG tablet Take 1 tablet every 4 hours AS NEEDED for afib heart rate >100 08/18/20   Sherran Needs, NP  doxycycline (VIBRA-TABS) 100 MG tablet Take 1 tablet (100 mg total) by mouth 2 (two) times daily for 7 days. 04/15/22 04/22/22  Bonnita Hollow, MD  ezetimibe (ZETIA) 10 MG tablet Take 1 tablet (10 mg total) by mouth  daily. 11/08/21   Werner Lean, MD  flecainide (TAMBOCOR) 50 MG tablet TAKE 1 TABLET BY MOUTH TWICE A DAY 04/17/22   Sherran Needs, NP  fluticasone [facility]) 50 MCG/ACT nasal spray Place 2 sprays into both nostrils daily. 04/15/22   Bonnita Hollow, MD  ipratropium-albuterol (DUONEB) 0.5-2.5 (3) MG/3ML SOLN Take 3 mLs by nebulization every 4 (four) hours as needed (wheezing). 04/15/22 05/15/22  Bonnita Hollow, MD  Nebulizer System All-In-One MISC 1 each by Does not apply route daily. 04/15/22 05/15/22  Bonnita Hollow, MD  oxyCODONE (OXY IR/ROXICODONE) 5 MG immediate release tablet Take 5 mg by mouth as  needed. Patient not taking: Reported on 04/15/2022    [provider]      Allergies    Morphine    Review of Systems   Review of Systems  Respiratory:  Positive for shortness of breath.     Physical Exam Updated Vital Signs BP (!) 148/87 (BP Location: Left Arm)   Pulse (!) 155   Temp 98.7 F (37.1 C)   Resp (!) 25   Ht '5\' 11"'$  (1.803 m)   Wt 130.6 kg   SpO2 90%   BMI 40.17 kg/m  Physical Exam Vitals and nursing note reviewed.  Constitutional:      General: He is not in acute distress.    Appearance: He is well-developed.  HENT:     Head: Normocephalic and atraumatic.  Eyes:     Conjunctiva/sclera: Conjunctivae normal.     Pupils: Pupils are equal, round, and reactive to light.  Cardiovascular:     Rate and Rhythm: Normal rate and regular rhythm.     Heart sounds: No murmur heard. Pulmonary:     Effort: Pulmonary effort is normal. No respiratory distress.     Breath sounds: Examination of the right-upper field reveals wheezing. Wheezing present. No rales.     Comments: Scant wheezing in the right upper lobe.  Mild tachypnea Abdominal:     General: There is no distension.     Palpations: Abdomen is soft.     Tenderness: There is no abdominal tenderness. There is no guarding or rebound.  Musculoskeletal:        General: No tenderness. Normal range of motion.     Cervical back: Normal range of motion and neck supple.     Right lower leg: No edema.     Left lower leg: No edema.  Skin:    General: Skin is warm and dry.     Findings: No erythema or rash.  Neurological:     Mental Status: He is alert and oriented to person, place, and time.  Psychiatric:        Behavior: Behavior normal.     ED Results / Procedures / Treatments   Labs (all labs ordered are listed, but only abnormal results are displayed) Labs Reviewed  COMPREHENSIVE METABOLIC PANEL - Abnormal; Notable for the following components:      Result Value   Glucose, Bld 212 (*)    Total  Bilirubin 1.4 (*)    All other components within normal limits  BRAIN NATRIURETIC PEPTIDE  CBC WITH DIFFERENTIAL/PLATELET  TROPONIN I (HIGH SENSITIVITY)  TROPONIN I (HIGH SENSITIVITY)    EKG EKG Interpretation  Date/Time:  Thursday April 18 2022 08:48:42 EDT Ventricular Rate:  83 PR Interval:  193 QRS Duration: 118 QT Interval:  363 QTC Calculation: 427 R Axis:   258 Text Interpretation: Sinus rhythm Incomplete RBBB and LAFB No  significant change since last tracing Confirmed by Blanchie Dessert [phone]) on 04/18/2022 10:43:42 AM  Radiology DG Chest Port 1 View  Result Date: 04/18/2022 CLINICAL DATA:  61 year old male with shortness of breath, wheezing and congestion. EXAM: PORTABLE CHEST 1 VIEW COMPARISON:  Chest CT 04/12/2022 and earlier. FINDINGS: Portable AP semi upright view at 0858 hours. Stable to mildly lower lung volumes. Mediastinal contours remain normal. Visualized tracheal air column is within normal limits. Negative portable appearance of the right lung. Streaky lingula peribronchial opacity which on the recent CT most resembles atelectasis or scarring. No pneumothorax or pleural effusion. Negative visible osseous structures. IMPRESSION: Mild lingula scarring or atelectasis. No acute cardiopulmonary abnormality. Electronically Signed   By: Genevie Ann M.D.   On: 04/18/2022 09:18    Procedures Procedures    Medications Ordered in ED Medications  diltiazem (CARDIZEM) 1 mg/mL load via infusion 10 mg (has no administration in time range)    And  diltiazem (CARDIZEM) 125 mg in dextrose 5% 125 mL (1 mg/mL) infusion (has no administration in time range)  albuterol (PROVENTIL) (2.5 MG/3ML) 0.083% nebulizer solution 5 mg (5 mg Nebulization Given 04/18/22 0903)  ipratropium (ATROVENT) nebulizer solution 0.5 mg (0.5 mg Nebulization Given 04/18/22 0903)  methylPREDNISolone sodium succinate (SOLU-MEDROL) 125 mg/2 mL injection 125 mg (125 mg Intravenous Given 04/18/22 1141)   albuterol (PROVENTIL) (2.5 MG/3ML) 0.083% nebulizer solution 5 mg (5 mg Nebulization Given 04/18/22 1144)  ipratropium (ATROVENT) nebulizer solution 0.5 mg (0.5 mg Nebulization Given 04/18/22 1144)  magnesium sulfate IVPB 2 g 50 mL (0 g Intravenous Stopped 04/18/22 1247)  albuterol (PROVENTIL,VENTOLIN) solution continuous neb (15 mg/hr Nebulization Given 04/18/22 1251)  diltiazem (CARDIZEM) injection 10 mg (10 mg Intravenous Given 04/18/22 1410)    ED Course/ Medical Decision Making/ A&P                           Medical Decision Making Amount and/or Complexity of Data Reviewed External Data Reviewed: notes.    Details: pcp Labs: ordered. Decision-making details documented in ED Course. Radiology: ordered and independent interpretation performed. Decision-making details documented in ED Course. ECG/medicine tests: ordered and independent interpretation performed. Decision-making details documented in ED Course.  Risk Prescription drug management. Decision regarding hospitalization.   Pt with multiple medical problems and comorbidities and presenting today with a complaint that caries a high risk for morbidity and mortality.  Here today with persistent symptoms of cough, wheezing and shortness of breath.  Suspect patient's symptoms are caused by a persistent and chronic bronchitis.  Patient has had recent extensive testing which has overall been negative.  We will recheck labs to ensure no new electrolyte abnormalities or signs of new heart failure but as of last week BNP and troponin were normal.  Patient has minimal wheezing and last neb was at 11 PM last night.  Lower suspicion at this time for CHF, ACS.  Low suspicion for PE as patient is anticoagulated.  Will rule out developing pneumonia.  Low suspicion for pneumothorax, pericarditis, myocarditis.  All of patient's recent visits were reviewed from his external medical records.  Patient given albuterol and Atrovent here.  2:26 PM I  independently interpreted patient's labs and EKG today.  Patient's EKG without acute changes.  CBC, BNP, troponin, CMP are all within normal limits.  I have independently visualized and interpreted pt's images today.  Chest x-ray without acute findings today.  On repeat evaluation patient is now wheezing and sats between 90  and 92%.  Will give Solu-Medrol, magnesium and another round of albuterol and Atrovent.   2:26 PM On second evaluation after the second neb patient is still wheezing diffusely.  We will do an hour-long continuous neb.  2:26 PM After an hour-long continuous neb patient is moving air better but unfortunately with the albuterol it put him into atrial fibrillation now with heart rates between 140-160.  We will give patient a dose of Cardizem to see if that resolves his atrial fibrillation.  Will monitor for an hour and recheck.  2:26 PM After a dose of Cardizem patient's heart rate is still 140-150.  He does report that he intermittently goes into A-fib frequently and usually takes an additional dose of Cardizem he already took his home dose today.  We will give another Cardizem bolus and started drip.  Patient is still wheezing diffusely and sats are 89%.  Patient placed on 2 L of oxygen.  Will admit for further care.  Discussed this with the patient at this time feel that he meets admission criteria.  He is comfortable with this plan.  CRITICAL CARE Performed by: Shyenne Maggard Total critical care time: 45 minutes Critical care time was exclusive of separately billable procedures and treating other patients. Critical care was necessary to treat or prevent imminent or life-threatening deterioration. Critical care was time spent personally by me on the following activities: development of treatment plan with patient and/or surrogate as well as nursing, discussions with consultants, evaluation of patient's response to treatment, examination of patient, obtaining history from patient  or surrogate, ordering and performing treatments and interventions, ordering and review of laboratory studies, ordering and review of radiographic studies, pulse oximetry and re-evaluation of patient's condition.          Final Clinical Impression(s) / ED Diagnoses Final diagnoses:  Bronchitis  Atrial fibrillation with RVR [facility])    Rx / DC Orders ED Discharge Orders     None         Blanchie Dessert, MD 04/18/22 1426

## 2022-04-19 DIAGNOSIS — I4891 Unspecified atrial fibrillation: Secondary | ICD-10-CM | POA: Diagnosis present

## 2022-04-19 LAB — CBC
HCT: 43.2 % (ref 39.0–52.0)
Hemoglobin: 14.6 g/dL (ref 13.0–17.0)
MCH: 31.1 pg (ref 26.0–34.0)
MCHC: 33.8 g/dL (ref 30.0–36.0)
MCV: 91.9 fL (ref 80.0–100.0)
Platelets: 265 10*3/uL (ref 150–400)
RBC: 4.7 MIL/uL (ref 4.22–5.81)
RDW: 13.4 % (ref 11.5–15.5)
WBC: 9.4 10*3/uL (ref 4.0–10.5)
nRBC: 0 % (ref 0.0–0.2)

## 2022-04-19 LAB — BASIC METABOLIC PANEL
Anion gap: 10 (ref 5–15)
BUN: 15 mg/dL (ref 8–23)
CO2: 25 mmol/L (ref 22–32)
Calcium: 9.4 mg/dL (ref 8.9–10.3)
Chloride: 102 mmol/L (ref 98–111)
Creatinine, Ser: 0.95 mg/dL (ref 0.61–1.24)
GFR, Estimated: >60 mL/min (ref >60)
Glucose, Bld: 166 mg/dL — ABNORMAL HIGH (ref 70–99)
Potassium: 4.2 mmol/L (ref 3.5–5.1)
Sodium: 137 mmol/L (ref 135–145)

## 2022-04-19 MED ORDER — GUAIFENESIN ER 600 MG PO TB12
600.0000 mg | ORAL_TABLET | Freq: Two times a day (BID) | ORAL | Status: DC
  Administered 2022-04-19 (×2): 600 mg via ORAL
  Filled 2022-04-19 (×2): qty 1

## 2022-04-19 MED ORDER — ACETAMINOPHEN 650 MG RE SUPP: 650.0000 mg | Freq: Four times a day (QID) | RECTAL | Status: DC | PRN

## 2022-04-19 MED ORDER — EZETIMIBE 10 MG PO TABS
10.0000 mg | ORAL_TABLET | Freq: Every day | ORAL | Status: DC
  Administered 2022-04-19: 10 mg via ORAL
  Filled 2022-04-19: qty 1

## 2022-04-19 MED ORDER — ACETAMINOPHEN 325 MG PO TABS: 650.0000 mg | ORAL_TABLET | Freq: Four times a day (QID) | ORAL | Status: DC | PRN

## 2022-04-19 MED ORDER — FLUTICASONE PROPIONATE 50 MCG/ACT NA SUSP
2.0000 | Freq: Every day | NASAL | Status: DC
  Administered 2022-04-19: 2 via NASAL
  Filled 2022-04-19: qty 16

## 2022-04-19 MED ORDER — APIXABAN 5 MG PO TABS
5.0000 mg | ORAL_TABLET | Freq: Two times a day (BID) | ORAL | Status: DC
  Administered 2022-04-19 (×2): 5 mg via ORAL
  Filled 2022-04-19 (×2): qty 1

## 2022-04-19 MED ORDER — SODIUM CHLORIDE 0.9% FLUSH
3.0000 mL | Freq: Two times a day (BID) | INTRAVENOUS | Status: DC
  Administered 2022-04-19: 3 mL via INTRAVENOUS

## 2022-04-19 MED ORDER — ONDANSETRON HCL 4 MG/2ML IJ SOLN: 4.0000 mg | Freq: Four times a day (QID) | INTRAMUSCULAR | Status: DC | PRN

## 2022-04-19 MED ORDER — SODIUM CHLORIDE 0.9% FLUSH: 3.0000 mL | Freq: Two times a day (BID) | INTRAVENOUS | 0 refills | Status: DC

## 2022-04-19 MED ORDER — ABACAVIR-DOLUTEGRAVIR-LAMIVUD 600-50-300 MG PO TABS
1.0000 | ORAL_TABLET | Freq: Every day | ORAL | Status: DC
  Administered 2022-04-19: 1 via ORAL
  Filled 2022-04-19: qty 1

## 2022-04-19 MED ORDER — SENNOSIDES-DOCUSATE SODIUM 8.6-50 MG PO TABS: 1.0000 | ORAL_TABLET | Freq: Every evening | ORAL | Status: DC | PRN

## 2022-04-19 MED ORDER — FLECAINIDE ACETATE 50 MG PO TABS
50.0000 mg | ORAL_TABLET | Freq: Two times a day (BID) | ORAL | Status: DC
  Administered 2022-04-19 (×2): 50 mg via ORAL
  Filled 2022-04-19 (×3): qty 1

## 2022-04-19 MED ORDER — GUAIFENESIN ER 600 MG PO TB12: 600.0000 mg | ORAL_TABLET | Freq: Two times a day (BID) | ORAL | 0 refills | Status: AC

## 2022-04-19 MED ORDER — DILTIAZEM HCL ER COATED BEADS 120 MG PO CP24
240.0000 mg | ORAL_CAPSULE | Freq: Every day | ORAL | Status: DC
  Administered 2022-04-19: 240 mg via ORAL
  Filled 2022-04-19: qty 2

## 2022-04-19 MED ORDER — ONDANSETRON HCL 4 MG PO TABS: 4.0000 mg | ORAL_TABLET | Freq: Four times a day (QID) | ORAL | Status: DC | PRN

## 2022-04-19 NOTE — Assessment & Plan Note (Signed)
Continue home Triumeq.

## 2022-04-19 NOTE — Progress Notes (Addendum)
Mobility Specialist Progress Note:   04/19/22 1108  Mobility  Activity Ambulated with assistance in hallway  Level of Assistance Independent  Assistive Device None  Distance Ambulated (ft) 550 ft  Activity Response Tolerated well  $Mobility charge 1 Mobility   Pt received up at sink willing to participate in mobility. No complaints of pain. Left in bed with call bell in reach and all needs met.   During Mobility: 90 HR Post Mobility:   80 HR; 95% SpO2  Geneticist, molecular Chat only

## 2022-04-19 NOTE — Assessment & Plan Note (Signed)
Continue Zetia. °

## 2022-04-19 NOTE — Hospital Course (Signed)
Ethan Lambert is a 61 y.o. male with medical history significant for PAF on Eliquis and flecainide, HIV (CD4 729, RNA undetectable on 10/31/2021), history of CVA, HLD who is admitted with A-fib with RVR.

## 2022-04-19 NOTE — Discharge Summary (Signed)
Physician Discharge Summary   Patient: Ethan Lambert MRN: [id number] DOB: 05-14-1961  Admit date:     04/18/2022  Discharge date: 04/19/22  Discharge Physician: Carlyle Lipa   PCP: Bonnita Hollow, MD   Recommendations at discharge:   Follow-up with PCP in 1 week.  He will need referral to pulmonologist for sleep study  Discharge Diagnoses: Principal Problem:   Paroxysmal atrial fibrillation with RVR ([place]) Active Problems:   Bronchitis improved.   HIV infection [facility])   Hyperlipidemia   Hospital Course: Ethan Lambert is a 60 y.o. male with medical history significant for PAF on Eliquis and flecainide, HIV (CD4 729, RNA undetectable on 10/31/2021), history of CVA, HLD who is admitted with A-fib with RVR.  Patient was admitted and started on supportive therapy for acute bronchitis.  He was placed on continuous nebs, mucolytic's.  Eventually patient was able to cough up the phlegm and his bronchitis almost resolved.  Chest x-ray was negative for any pneumonia.  Currently patient is on room air maintaining oxygen saturation of 95 to 96%.  Secondary to prolonged breathing treatments patient went into A-fib with RVR.  He was placed on Cardizem drip.  Subsequently patient converted to normal sinus rhythm and put back on p.o. Cardizem which is his home dose.  Patient is currently being discharged home.  Patient also complains of symptoms of sleep apnea.  He needs to follow-up with PCP in 1 week at that time he needs referral to a pulmonologist to undergo sleep study.  His other comorbidities namely his HIV currently continues to be stable patient was continued on antiheart therapy and needs to follow-up with his infectious disease specialist.    Consultants: Cardiology Procedures performed: None Disposition: Home Diet recommendation:  Discharge Diet Orders (From admission, onward)     Start     Ordered   04/19/22 0000  Diet - low sodium heart healthy        04/19/22 1153            Cardiac diet DISCHARGE MEDICATION: Allergies as of 04/19/2022       Reactions   Ms Contin [morphine] Other (See Comments)   Hallucinations         Medication List     STOP taking these medications    doxycycline 100 MG tablet Commonly known as: VIBRA-TABS   ibuprofen 200 MG tablet Commonly known as: ADVIL       TAKE these medications    albuterol 108 (90 Base) MCG/ACT inhaler Commonly known as: VENTOLIN HFA Inhale 2 puffs into the lungs every 6 (six) hours as needed for wheezing or shortness of breath.   apixaban 5 MG Tabs tablet Commonly known as: Eliquis Take 1 tablet (5 mg total) by mouth 2 (two) times daily.   diltiazem 240 MG 24 hr capsule Commonly known as: CARDIZEM CD TAKE 1 CAPSULE BY MOUTH EVERY DAY What changed:  how much to take when to take this   diltiazem 30 MG tablet Commonly known as: Cardizem Take 1 tablet every 4 hours AS NEEDED for afib heart rate >100 What changed:  how much to take how to take this when to take this reasons to take this additional instructions   ezetimibe 10 MG tablet Commonly known as: ZETIA Take 1 tablet (10 mg total) by mouth daily. What changed: when to take this   flecainide 50 MG tablet Commonly known as: TAMBOCOR TAKE 1 TABLET BY MOUTH TWICE A DAY   fluticasone 50  MCG/ACT nasal spray Commonly known as: FLONASE Place 2 sprays into both nostrils daily. What changed:  when to take this reasons to take this   guaiFENesin 600 MG 12 hr tablet Commonly known as: MUCINEX Take 1 tablet (600 mg total) by mouth 2 (two) times daily for 5 days.   ipratropium-albuterol 0.5-2.5 (3) MG/3ML Soln Commonly known as: DUONEB Take 3 mLs by nebulization every 4 (four) hours as needed (wheezing).   sodium chloride flush 0.9 % Soln Commonly known as: NS Inject 3 mLs into the vein every 12 (twelve) hours for 3 days.   Triumeq 600-50-300 MG tablet Generic drug: abacavir-dolutegravir-lamiVUDine Take 1  tablet by mouth daily.        Follow-up Information     Bonnita Hollow, MD Follow up in 1 week(s).   Specialty: Family Medicine Why: Please refer to Pulmonologist for Sleep study. Contact information: [place] [address] [phone]                Discharge Exam: Danley Danker Weights   04/18/22 0843  Weight: 130.6 kg   Patient Was seen and examined  Alert and oriented not in any apparent distress CVS S1-S2 positive Respiratory: Bilateral clear and equal breath sounds.  Condition at discharge: fair  The results of significant diagnostics from this hospitalization (including imaging, microbiology, ancillary and laboratory) are listed below for reference.   Imaging Studies: DG Chest Port 1 View  Result Date: 04/18/2022 CLINICAL DATA:  61 year old male with shortness of breath, wheezing and congestion. EXAM: PORTABLE CHEST 1 VIEW COMPARISON:  Chest CT 04/12/2022 and earlier. FINDINGS: Portable AP semi upright view at 0858 hours. Stable to mildly lower lung volumes. Mediastinal contours remain normal. Visualized tracheal air column is within normal limits. Negative portable appearance of the right lung. Streaky lingula peribronchial opacity which on the recent CT most resembles atelectasis or scarring. No pneumothorax or pleural effusion. Negative visible osseous structures. IMPRESSION: Mild lingula scarring or atelectasis. No acute cardiopulmonary abnormality. Electronically Signed   By: Genevie Ann M.D.   On: 04/18/2022 09:18   CT CHEST W CONTRAST  Addendum Date: 04/12/2022   ADDENDUM REPORT: 04/12/2022 09:07 ADDENDUM: Corrected report: Original report was generated with an error due to voice recognition software, corrected as follows: No consolidation, pleural effusion or pneumothorax. Electronically Signed   By: Yetta Glassman M.D.   On: 04/12/2022 09:07   Result Date: 04/12/2022 CLINICAL DATA:  Chronic cough with congestion past 4 days EXAM: CT  CHEST WITH CONTRAST TECHNIQUE: Multidetector CT imaging of the chest was performed during intravenous contrast administration. RADIATION DOSE REDUCTION: This exam was performed according to the departmental dose-optimization program which includes automated exposure control, adjustment of the mA and/or kV according to patient size and/or use of iterative reconstruction technique. CONTRAST:  69m OMNIPAQUE IOHEXOL 300 MG/ML  SOLN COMPARISON:  CT heart dated October 05, 2020 and May 26, 2018 FINDINGS: Cardiovascular: Normal heart size. Pericardial effusion. Aortic valve calcifications. Normal caliber thoracic aorta with mild calcified plaque. No suspicious filling defects of the central pulmonary arteries. Mediastinum/Nodes: Small hiatal hernia. Thyroid is unremarkable. No pathologically enlarged lymph nodes seen in the chest. Lungs/Pleura: Central airways are patent. Mild linear opacities of the right middle lobe and lingula, likely due to scarring or atelectasis. Consolidation, pleural effusion or pneumothorax. Small solid pulmonary nodule of the right lower lobe measuring 5 mm on series 3 image 88, unchanged in size when compared with May 26, 2018 exam, no further follow-up imaging  is necessary given greater than 1 year stability. Upper Abdomen: Cholecystectomy clips.  No acute abnormality. Musculoskeletal: No chest wall abnormality. No acute or significant osseous findings. IMPRESSION: 1. No acute findings in the chest. 2. Aortic valve calcifications, findings can be seen in the setting of aortic stenosis. Recommend correlation with echocardiography. 3. Mild aortic Atherosclerosis (ICD10-I70.0). Electronically Signed: By: Yetta Glassman M.D. On: 04/12/2022 08:12    Microbiology: Results for orders placed or performed in visit on 01/18/20  SARS Coronavirus 2 (TAT 6-24 hrs)     Status: None   Collection Time: 01/18/20 12:00 AM  Result Value Ref Range Status   SARS Coronavirus 2 RESULT: NEGATIVE  Final     Comment: RESULT: NEGATIVESARS-CoV-2 INTERPRETATION:A NEGATIVE  test result means that SARS-CoV-2 RNA was not present in the specimen above the limit of detection of this test. This does not preclude a possible SARS-CoV-2 infection and should not be used as the  sole basis for patient management decisions. Negative results must be combined with clinical observations, patient history, and epidemiological information. Optimum specimen types and timing for peak viral levels during infections caused by SARS-CoV-2  have not been determined. Collection of multiple specimens or types of specimens may be necessary to detect virus. Improper specimen collection and handling, sequence variability under primers/probes, or organism present below the limit of detection may  lead to false negative results. Positive and negative predictive values of testing are highly dependent on prevalence. False negative test results are more likely when prevalence of disease is high.The expected result is NEGATIVE.Fact S heet for  Healthcare Providers: LocalChronicle.no Sheet for Patients: SalonLookup.es Reference Range - Negative     Labs: CBC: Recent Labs  Lab 04/18/22 0859 04/19/22 0203  WBC 10.4 9.4  NEUTROABS 6.8  --   HGB 14.7 14.6  HCT 44.6 43.2  MCV 92.9 91.9  PLT 268 681   Basic Metabolic Panel: Recent Labs  Lab 04/18/22 0859 04/19/22 0203  NA 136 137  K 3.8 4.2  CL 100 102  CO2 26 25  GLUCOSE 212* 166*  BUN 15 15  CREATININE 1.03 0.95  CALCIUM 9.6 9.4   Liver Function Tests: Recent Labs  Lab 04/18/22 0859  AST 22  ALT 27  ALKPHOS 70  BILITOT 1.4*  PROT 7.5  ALBUMIN 4.2   CBG: No results for input(s): "GLUCAP" in the last 168 hours.  Discharge time spent: greater than 30 minutes.  Signed: Carlyle Lipa, MD [facility] 04/19/2022

## 2022-04-19 NOTE — Assessment & Plan Note (Signed)
History consistent with bronchitis.  Imaging without evidence of pneumonia, edema, effusion.  No wheezing on exam.  Symptomatically feeling much better. -Supportive care with Mucinex, Flonase, etc

## 2022-04-19 NOTE — Assessment & Plan Note (Signed)
RVR triggered by continuous albuterol nebulizer.  He was started on IV diltiazem drip and now back in sinus rhythm with controlled rate. -Titrate off IV diltiazem gtt -Resume home flecainide 50 mg BID -Resume home diltiazem 240 mg daily -Continue Eliquis

## 2022-04-22 ENCOUNTER — Ambulatory Visit: Payer: Commercial Managed Care - HMO | Attending: Internal Medicine | Admitting: Internal Medicine

## 2022-04-22 ENCOUNTER — Telehealth: Payer: Self-pay

## 2022-04-22 ENCOUNTER — Encounter: Payer: Self-pay | Admitting: Internal Medicine

## 2022-04-22 VITALS — BP 120/70 | HR 77 | Ht 73.0 in | Wt 280.0 lb

## 2022-04-22 DIAGNOSIS — I700 Atherosclerosis of aorta: Secondary | ICD-10-CM | POA: Diagnosis not present

## 2022-04-22 DIAGNOSIS — I10 Essential (primary) hypertension: Secondary | ICD-10-CM

## 2022-04-22 DIAGNOSIS — I480 Paroxysmal atrial fibrillation: Secondary | ICD-10-CM | POA: Diagnosis not present

## 2022-04-22 DIAGNOSIS — I351 Nonrheumatic aortic (valve) insufficiency: Secondary | ICD-10-CM | POA: Diagnosis not present

## 2022-04-22 MED ORDER — FLECAINIDE ACETATE 100 MG PO TABS: 100.0000 mg | ORAL_TABLET | Freq: Two times a day (BID) | ORAL | 3 refills | Status: DC

## 2022-04-22 MED ORDER — REPATHA SURECLICK 140 MG/ML ~~LOC~~ SOAJ: 1.0000 mL | SUBCUTANEOUS | 11 refills | Status: DC

## 2022-04-22 NOTE — Progress Notes (Unsigned)
Synopsis: Referred in November 2023 for wheezing and cough after having COVID.  Has a history of atrial fibrillation and HIV.  Subjective:   PATIENT ID: Ethan Lambert GENDER: male DOB: 1961/02/23, MRN: [id number]   HPI  No chief complaint on file.   *** October 2023 Hospital records reviewed including discharge summary.  Patient was treated for bronchitis.  Discharged after treatment with bronchodilators and mucolytic's.  No antibiotics used.  Takes flecainide and apixaban for A-fib  Past Medical History:  Diagnosis Date   Anemia    Arthritis    "probably right ring finger joint" (05/28/2018)   Childhood asthma    as child none now   Clotting disorder ([place])    CVA (cerebral vascular accident) ([place]) 1999 X 2   [place] , NON-HEMORRHAGIC; no residual   Family history of malignant neoplasm of gastrointestinal tract    Heart murmur    History of blood transfusion 1999   "related to the stroke"   History of kidney stones    HIV disease ([place]) dx 1999   MONITORED BY INFECTIOUS DISEASE -- DR Herbie [place] COMER   Hypertension    Paroxysmal atrial fibrillation ([place])    CARDIOLOGIST--  DR Rayann Heman   Right knee meniscal tear    Seizures ([place])    1999   Skin cancer    skin   Sleep apnea    "didn't follow thru w/mask" (05/28/2018)     Family History  Problem Relation Age of Onset   Arrhythmia Father    Melanoma Father    Heart attack Brother    Prostate cancer Maternal Grandfather    Colon cancer Maternal Grandfather    Lung cancer Mother        smoker   Hypertension Mother    Hyperlipidemia Mother    Other Maternal Grandmother        harding of the arteries   Rectal cancer Neg Hx    Stomach cancer Neg Hx      Social History   Socioeconomic History   Marital status: Married    Spouse name: Not on file   Number of children: 1   Years of education: Not on file   Highest education level: Not on file  Occupational History   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   Social Determinants of Health   Financial Resource Strain: Not on file  Food Insecurity: No Food Insecurity (04/18/2022)   Hunger Vital Sign    Worried About Running Out of Food in the Last Year: Never true    [place] in the Last Year: Never true  Transportation Needs: No Transportation Needs (04/18/2022)   PRAPARE - Hydrologist (Medical): No    Lack of Transportation (Non-Medical): No  Physical Activity: Not on file  Stress: Not on file  Social Connections: Not  on file  Intimate Partner Violence: Not At Risk (04/18/2022)   Humiliation, Afraid, Rape, and Kick questionnaire    Fear of Current or Ex-Partner: No    Emotionally Abused: No    Physically Abused: No    Sexually Abused: No     Allergies  Allergen Reactions   Ms Contin [Morphine] Other (See Comments)    Hallucinations      Outpatient Medications Prior to Visit  Medication Sig Dispense Refill   abacavir-dolutegravir-lamiVUDine (TRIUMEQ) 600-50-300 MG tablet Take 1 tablet by mouth daily. 30 tablet 11   albuterol (VENTOLIN HFA) 108 (90 Base) MCG/ACT inhaler Inhale 2 puffs into the lungs every 6 (six) hours as needed for wheezing or shortness of breath. 8 g 0   apixaban (ELIQUIS) 5 MG TABS tablet Take 1 tablet (5 mg total) by mouth 2 (two) times daily. 180 tablet 1   diltiazem (CARDIZEM CD) 240 MG 24 hr capsule TAKE 1 CAPSULE BY MOUTH EVERY DAY 90 capsule 3    diltiazem (CARDIZEM) 30 MG tablet Take 1 tablet every 4 hours AS NEEDED for afib heart rate >100 30 tablet 1   Evolocumab (REPATHA SURECLICK) 824 MG/ML SOAJ Inject 140 mg into the skin every 14 (fourteen) days. 2 mL 11   ezetimibe (ZETIA) 10 MG tablet Take 1 tablet (10 mg total) by mouth daily. 90 tablet 3   flecainide (TAMBOCOR) 100 MG tablet Take 1 tablet (100 mg total) by mouth 2 (two) times daily. 180 tablet 3   fluticasone (FLONASE) 50 MCG/ACT nasal spray Place 2 sprays into both nostrils daily. (Patient taking differently: Place 2 sprays into both nostrils daily as needed for allergies (congestion).) 16 g 6   guaiFENesin (MUCINEX) 600 MG 12 hr tablet Take 1 tablet (600 mg total) by mouth 2 (two) times daily for 5 days. 10 tablet 0   ipratropium-albuterol (DUONEB) 0.5-2.5 (3) MG/3ML SOLN Take 3 mLs by nebulization every 4 (four) hours as needed (wheezing). 540 mL 0   No facility-administered medications prior to visit.    ROS    Objective:  Physical Exam   There were no vitals filed for this visit.  ***  CBC    Component Value Date/Time   WBC 9.4 04/19/2022 0203   RBC 4.70 04/19/2022 0203   HGB 14.6 04/19/2022 0203   HGB 15.5 10/25/2019 0940   HCT 43.2 04/19/2022 0203   HCT 44.5 10/25/2019 0940   PLT 265 04/19/2022 0203   PLT 261 10/25/2019 0940   MCV 91.9 04/19/2022 0203   MCV 90 10/25/2019 0940   MCH 31.1 04/19/2022 0203   MCHC 33.8 04/19/2022 0203   RDW 13.4 04/19/2022 0203   RDW 13.1 10/25/2019 0940   LYMPHSABS 2.2 04/18/2022 0859   LYMPHSABS 2.4 05/11/2018 1022   MONOABS 0.9 04/18/2022 0859   EOSABS 0.3 04/18/2022 0859   EOSABS 0.1 05/11/2018 1022   BASOSABS 0.1 04/18/2022 0859   BASOSABS 0.1 05/11/2018 1022     Chest imaging: October 28 CT chest images independently reviewed showing some central airway thickening, subsegmental atelectasis versus mucous plugging predominantly in the lingula.  PFT:  Labs:  Path:  Echo:  Heart  Catheterization:       Assessment & Plan:   No diagnosis found.  Discussion: ***  Immunizations: Immunization History  Administered Date(s) Administered   H1N1 09/15/2008   Hepatitis B 08/05/2000, 03/25/2002, 09/30/2002   Influenza Split 04/07/2012, 03/14/2016   Influenza Whole 03/27/2006, 04/14/2008, 04/07/2009, 04/04/2010   Influenza, Seasonal, Injecte, Preservative Fre  04/08/2015   Influenza,inj,Quad PF,6+ Mos 05/12/2013, 04/06/2017, 05/12/2019, 04/12/2020, 04/19/2022   Janssen (J&J) SARS-COV-2 Vaccination 05/15/2020   PFIZER Comirnaty(Gray Top)Covid-19 Tri-Sucrose Vaccine 09/01/2020   Pneumococcal Conjugate-13 07/29/2019   Pneumococcal Polysaccharide-23 09/08/2001, 12/10/2007, 11/10/2012   Tdap 03/14/2016     Current Outpatient Medications:    abacavir-dolutegravir-lamiVUDine (TRIUMEQ) 600-50-300 MG tablet, Take 1 tablet by mouth daily., Disp: 30 tablet, Rfl: 11   albuterol (VENTOLIN HFA) 108 (90 Base) MCG/ACT inhaler, Inhale 2 puffs into the lungs every 6 (six) hours as needed for wheezing or shortness of breath., Disp: 8 g, Rfl: 0   apixaban (ELIQUIS) 5 MG TABS tablet, Take 1 tablet (5 mg total) by mouth 2 (two) times daily., Disp: 180 tablet, Rfl: 1   diltiazem (CARDIZEM CD) 240 MG 24 hr capsule, TAKE 1 CAPSULE BY MOUTH EVERY DAY, Disp: 90 capsule, Rfl: 3   diltiazem (CARDIZEM) 30 MG tablet, Take 1 tablet every 4 hours AS NEEDED for afib heart rate >100, Disp: 30 tablet, Rfl: 1   Evolocumab (REPATHA SURECLICK) 939 MG/ML SOAJ, Inject 140 mg into the skin every 14 (fourteen) days., Disp: 2 mL, Rfl: 11   ezetimibe (ZETIA) 10 MG tablet, Take 1 tablet (10 mg total) by mouth daily., Disp: 90 tablet, Rfl: 3   flecainide (TAMBOCOR) 100 MG tablet, Take 1 tablet (100 mg total) by mouth 2 (two) times daily., Disp: 180 tablet, Rfl: 3   fluticasone (FLONASE) 50 MCG/ACT nasal spray, Place 2 sprays into both nostrils daily. (Patient taking differently: Place 2 sprays into both nostrils  daily as needed for allergies (congestion).), Disp: 16 g, Rfl: 6   guaiFENesin (MUCINEX) 600 MG 12 hr tablet, Take 1 tablet (600 mg total) by mouth 2 (two) times daily for 5 days., Disp: 10 tablet, Rfl: 0   ipratropium-albuterol (DUONEB) 0.5-2.5 (3) MG/3ML SOLN, Take 3 mLs by nebulization every 4 (four) hours as needed (wheezing)., Disp: 540 mL, Rfl: 0

## 2022-04-22 NOTE — Progress Notes (Signed)
Cardiology Office Note:    Date:  04/22/2022   ID:  E[place], DOB 1961-02-04, MRN [id number]  PCP:  Bonnita Hollow, MD   [place]  Cardiologist:  Werner Lean, MD  Advanced Practice Provider:  No care team member to display Electrophysiologist:  Thompson Grayer, MD      CC:  DOD- myalgias  History of Present Illness:    Ethan [place] is a 61 y.o. male with a hx of PAF s/p Ablation and flecainide PIP CHADSVASC 3 on eliquis. HTN who presents for evaluation 09/14/20.  2022: CCTA consistent with non-obstructive disease.   We discussed is surgical risk for shoulder surgery and felt he was a reasonable candidate.  No cardiac complications. 2023: new myalgias after failing statins (rosuvastatin irrespective of dose).  Interval return to atrial fibrillation.   Patient notes that he is recovering from bronchitis and has a recent hospitalization. .   Had a lot of albuterol and atrial fibrillation RVR as a result. Has been having increased episodes of palpitations.  Prior to his acute infection, had been going to the gym and last 8 lbs.  Improved his diet. No CP.  Prior to his acute illness  he was pending Repatha.  Past Medical History:  Diagnosis Date   Anemia    Arthritis    "probably right ring finger joint" (05/28/2018)   Childhood asthma    as child none now   Clotting disorder ([place])    CVA (cerebral vascular accident) ([place]) 1999 X 2   LEFT FRONTAL & OCCIPITAL , NON-HEMORRHAGIC; no residual   Family history of malignant neoplasm of gastrointestinal tract    Heart murmur    History of blood transfusion 1999   "related to the stroke"   History of kidney stones    HIV disease ([place]) dx 1999   MONITORED BY INFECTIOUS DISEASE -- DR [place]   Hypertension    Paroxysmal atrial fibrillation ([place])    CARDIOLOGIST--  DR Rayann Heman   Right knee meniscal tear    Seizures ([place])    1999   Skin cancer    skin   Sleep apnea    "didn't  follow thru w/mask" (05/28/2018)    Past Surgical History:  Procedure Laterality Date   APPENDECTOMY  06-09-2003   ATRIAL FIBRILLATION ABLATION  05/28/2018   ATRIAL FIBRILLATION ABLATION N/A 05/28/2018   Procedure: ATRIAL FIBRILLATION ABLATION;  Surgeon: Thompson Grayer, MD;  Location: [facility];  Service: Cardiovascular;  Laterality: N/A;   BACK SURGERY     INGUINAL HERNIA REPAIR Bilateral 1990s   KNEE ARTHROSCOPY WITH MEDIAL MENISECTOMY Right 12/31/2013   Procedure: RIGHT KNEE ARTHROSCOPY PARTIAL MEDIAL MENISCECTOMY DEBRIDEMENT AND CHONDROPLASTY ;  Surgeon: Sydnee Cabal, MD;  Location: [facility];  Service: Orthopedics;  Laterality: Right;   KNEE ARTHROSCOPY WITH MEDIAL MENISECTOMY Left 01/27/2019   Procedure: LEFT KNEE ARTHROSCOPY WITH PARTIAL MEDIAL MENISCECTOMY;  Surgeon: Leandrew Koyanagi, MD;  Location: [facility];  Service: Orthopedics;  Laterality: Left;   LAPAROSCOPIC CHOLECYSTECTOMY  07-08-2003   LASER ABLATION ANAL CONDYLOMA  05-18-2001   LUMBAR LAMINECTOMY/DECOMPRESSION MICRODISCECTOMY Left 03/15/2015   Procedure: Left Lumbar five- sacral one microdiskectomy;  Surgeon: Consuella Lose, MD;  Location: [facility];  Service: Neurosurgery;  Laterality: Left;  Left L5S1 microdiskectomy   NEGATIVE SLEEP STUDY  01-03-2012  in epic   SKIN CANCER EXCISION Right    "cut it off my shoulder"   TRANSTHORACIC  ECHOCARDIOGRAM  12-01-2008   MODERATE LVH/  EF 55-60%/  MILD MR/  NEGATIVE BUBBLE STUDY    Current Medications: Current Meds  Medication Sig   abacavir-dolutegravir-lamiVUDine (TRIUMEQ) 600-50-300 MG tablet Take 1 tablet by mouth daily.   albuterol (VENTOLIN HFA) 108 (90 Base) MCG/ACT inhaler Inhale 2 puffs into the lungs every 6 (six) hours as needed for wheezing or shortness of breath.   apixaban (ELIQUIS) 5 MG TABS tablet Take 1 tablet (5 mg total) by mouth 2 (two) times daily.   diltiazem (CARDIZEM CD) 240 MG 24 hr capsule TAKE 1 CAPSULE BY  MOUTH EVERY DAY   diltiazem (CARDIZEM) 30 MG tablet Take 1 tablet every 4 hours AS NEEDED for afib heart rate >100   ezetimibe (ZETIA) 10 MG tablet Take 1 tablet (10 mg total) by mouth daily.   flecainide (TAMBOCOR) 100 MG tablet Take 1 tablet (100 mg total) by mouth 2 (two) times daily.   fluticasone (FLONASE) 50 MCG/ACT nasal spray Place 2 sprays into both nostrils daily. (Patient taking differently: Place 2 sprays into both nostrils daily as needed for allergies (congestion).)   guaiFENesin (MUCINEX) 600 MG 12 hr tablet Take 1 tablet (600 mg total) by mouth 2 (two) times daily for 5 days.   ipratropium-albuterol (DUONEB) 0.5-2.5 (3) MG/3ML SOLN Take 3 mLs by nebulization every 4 (four) hours as needed (wheezing).   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   Social Determinants of Health   Financial Resource Strain: Not on file  Food Insecurity: No Food Insecurity (04/18/2022)   Hunger Vital Sign    Worried About Running Out of Food in the Last Year: Never true    [place] in the Last Year: Never true  Transportation Needs: No  Transportation Needs (04/18/2022)   PRAPARE - Hydrologist (Medical): No    Lack of Transportation (Non-Medical): No  Physical Activity: Not on file  Stress: Not on file  Social Connections: Not on file   Family History: The patient's family history includes Arrhythmia in his father; Colon cancer in his maternal grandfather; Heart attack in his brother; Hyperlipidemia in his mother; Hypertension in his mother; Lung cancer in his mother; Melanoma in his father; Other in his maternal grandmother; Prostate cancer in his maternal grandfather. There is no history of Rectal cancer or Stomach cancer.  History of coronary artery disease notable for two brothers in their six. History of heart failure notable for  no membrs. History of arrhythmia notable for father having A fib. Denies family history of sudden cardiac death including drowning, car accidents, or unexplained deaths in the family. No history of bicuspid aortic valve or aortic aneurysm or dissection.   ROS:   Please see the history of present illness.     All other systems reviewed and are negative.  EKGs/Labs/Other Studies Reviewed:    The following studies were reviewed today:  EKG:  04/18/22:A fib RVR  08/23/20:  SR 64 1st AV block  ECHO COMPLETE WO IMAGING ENHANCING AGENT 11/30/2021  Narrative ECHOCARDIOGRAM REPORT    Patient Name:   DUSAN LIPFORD Date of Exam: 11/30/2021 Medical Rec #:  [id number]        Height:       73.0 in Accession #:    [id number]       Weight:       285.0 lb Date of Birth:  13-Oct-1960         BSA:          2.501 m Patient Age:    46 years         BP:           138/68 mmHg Patient Gender: M                HR:           64 bpm. Exam Location:  [facility]  Procedure: 2D Echo, 3D Echo, Cardiac Doppler, Color Doppler and Strain Analysis  MODIFIED REPORT: This report was modified by Cherlynn Kaiser MD on 11/30/2021 due to no revision made. Indications:     I35.1 Aortic  Insufficiency  History:         Patient has prior history of Echocardiogram examinations, most recent 09/14/2020. Stroke, Aortic Valve Disease, Arrythmias:Atrial Fibrillation, Signs/Symptoms:Murmur; Risk Factors:Sleep Apnea, Hypertension and Family History of Coronary Artery Disease. Aortic Insuficiency, HIV.  Sonographer:     Deliah Boston RDCS Referring Phys:  Werner Lean Diagnosing Phys: Cherlynn Kaiser MD  IMPRESSIONS   1. Left ventricular ejection fraction, by estimation, is 65 to 70%. The left ventricle has normal function. The left ventricle has no regional wall motion abnormalities. There is severe asymmetric left ventricular hypertrophy of the septal segment (16 mm). Left ventricular diastolic parameters were normal. The average left ventricular global longitudinal strain is -22.8 %. The global longitudinal strain is normal. 2. Right ventricular systolic function is normal. The right ventricular size is mildly enlarged. There is normal pulmonary artery systolic pressure. The estimated right ventricular systolic pressure is 64.3 mmHg. 3. The mitral valve is grossly normal. Mild mitral valve regurgitation. No evidence of mitral stenosis. 4. The aortic valve is abnormal. Unable to determine aortic valve morphology, indeterminate number of cusps. There is severe calcifcation of the aortic valve primarily along noncoronary cusp. Aortic valve regurgitation is mild. Aortic valve sclerosis/calcification is present, without any evidence of aortic stenosis. 5. Aortic dilatation noted. There is borderline dilatation of the aortic root, measuring 37 mm. 6. The inferior vena cava is normal in size with greater than 50% respiratory variability, suggesting right atrial pressure of 3 mmHg. 7. Cannot exclude a small PFO with left to right shunt. Rightward bowing of atrial septum. Consider agitated saline exam if clinically indicated.  FINDINGS Left Ventricle: Left ventricular ejection  fraction, by estimation, is 65 to 70%. The left ventricle has normal function. The left ventricle has no regional wall motion abnormalities. The average left ventricular global longitudinal strain is -  22.8 %. The global longitudinal strain is normal. 3D left ventricular ejection fraction analysis performed but not reported based on interpreter judgement due to suboptimal tracking. The left ventricular internal cavity size was normal in size. There is severe asymmetric left ventricular hypertrophy of the septal segment. Left ventricular diastolic parameters were normal.  Right Ventricle: Prominent moderate band. The right ventricular size is mildly enlarged. No increase in right ventricular wall thickness. Right ventricular systolic function is normal. There is normal pulmonary artery systolic pressure. The tricuspid regurgitant velocity is 2.34 m/s, and with an assumed right atrial pressure of 3 mmHg, the estimated right ventricular systolic pressure is 32.9 mmHg.  Left Atrium: Left atrial size was normal in size.  Right Atrium: Right atrial size was normal in size.  Pericardium: There is no evidence of pericardial effusion.  Mitral Valve: The mitral valve is grossly normal. Mild mitral valve regurgitation. No evidence of mitral valve stenosis.  Tricuspid Valve: The tricuspid valve is normal in structure. Tricuspid valve regurgitation is trivial. No evidence of tricuspid stenosis.  Aortic Valve: The aortic valve is abnormal. There is severe calcifcation of the aortic valve. Aortic valve regurgitation is mild. Aortic regurgitation PHT measures 484 msec. Aortic valve sclerosis/calcification is present, without any evidence of aortic stenosis. Aortic valve mean gradient measures 7.0 mmHg.  Pulmonic Valve: The pulmonic valve was normal in structure. Pulmonic valve regurgitation is trivial. No evidence of pulmonic stenosis.  Aorta: Aortic dilatation noted. There is borderline dilatation of the  aortic root, measuring 37 mm.  Venous: The inferior vena cava is normal in size with greater than 50% respiratory variability, suggesting right atrial pressure of 3 mmHg.  IAS/Shunts: There is right bowing of the interatrial septum, suggestive of elevated left atrial pressure. Cannot exclude a small PFO. A small patent foramen ovale is detected with predominantly left to right shunting across the atrial septum.   LEFT VENTRICLE PLAX 2D LVIDd:         5.10 cm   Diastology LVIDs:         2.40 cm   LV e' medial:    8.05 cm/s LV PW:         1.40 cm   LV E/e' medial:  8.6 LV IVS:        1.60 cm   LV e' lateral:   8.59 cm/s LVOT diam:     2.50 cm   LV E/e' lateral: 8.0 LV SV:         90 LV SV Index:   36        2D Longitudinal Strain LVOT Area:     4.91 cm  2D Strain GLS (A2C):   -22.6 % 2D Strain GLS (A3C):   -22.4 % 2D Strain GLS (A4C):   -23.4 % 2D Strain GLS Avg:     -22.8 %  3D Volume EF: LV EDV:       160 ml LV ESV:       39 ml LV SV:        121 ml  RIGHT VENTRICLE RV Basal diam:  3.90 cm RV S prime:     19.10 cm/s TAPSE (M-mode): 3.1 cm  LEFT ATRIUM           Index        RIGHT ATRIUM           Index LA diam:      4.50 cm 1.80 cm/m   RA Area:     21.70 cm LA  Vol (A4C): 72.9 ml 29.15 ml/m  RA Volume:   70.50 ml  28.19 ml/m AORTIC VALVE AV Mean Grad: 7.0 mmHg LVOT Vmax:    87.15 cm/s LVOT Vmean:   56.950 cm/s LVOT VTI:     0.184 m AI PHT:       484 msec  AORTA Ao Root diam: 3.70 cm Ao Asc diam:  3.50 cm  MITRAL VALVE               TRICUSPID VALVE MV Area (PHT): cm         TR Peak grad:   21.9 mmHg MV Decel Time: 203 msec    TR Vmax:        234.00 cm/s MV E velocity: 69.12 cm/s MV A velocity: 62.02 cm/s  SHUNTS MV E/A ratio:  1.11        Systemic VTI:  0.18 m Systemic Diam: 2.50 cm  Cherlynn Kaiser MD Electronically signed by Cherlynn Kaiser MD Signature Date/Time: 11/30/2021/10:17:23 AM    Cardiac CT : Date: 10/05/20 Results: IMPRESSION: 1. Coronary  calcium score of 301. This was 86th percentile for age, sex, and race matched control.   2/. Normal coronary origin with Right dominance.   3. CAD-RADS 2. Mild non-obstructive CAD (25-49%) worst in the Ramus Intermedius (small vessel). Consider non-atherosclerotic causes of chest pain. Consider preventive therapy and risk factor modification.   4. 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  Coronary sinus dilation: 14 mm  6. Tril-leaflet aortic valve with increase AV calcium  IMPRESSION: 1.  Aortic Atherosclerosis  ECG or NM Stress Testing : Date: 09/14/2020 Results:  Nuclear stress EF: 60%. No T wave inversion was noted during stress. There was no ST segment deviation noted during stress. Defect 1: There is a medium defect of moderate severity present in the apical septal, apical lateral and apex location. This is a low risk study.   Medium size, moderate intensity fixed apical, apical septal and apical lateral perfusion defect, suspicious for artifact. No significant reversible ischemia. LVEF 60% with normal wall motion. This is a low risk study. No prior for comparison.    Recent Labs: 04/11/2022: Pro B Natriuretic peptide (BNP) 12.0 04/18/2022: ALT 27; B Natriuretic Peptide 10.7 04/19/2022: BUN 15; Creatinine, Ser 0.95; Hemoglobin 14.6; Platelets 265; Potassium 4.2; Sodium 137  Recent Lipid Panel    Component Value Date/Time   CHOL 152 02/08/2022 0902   TRIG 233 (H) 02/08/2022 0902   TRIG 369 02/16/2010 0000   HDL 28 (L) 02/08/2022 0902   CHOLHDL 5.4 (H) 02/08/2022 0902   CHOLHDL 6.2 (H) 08/15/2020 0920   VLDL 36 (H) 11/27/2015 0915   LDLCALC 85 02/08/2022 0902   LDLCALC 115 (H) 08/15/2020 0920    Risk Assessment/Calculations:    CHA2DS2-VASc Score = 4  This indicates a 4.8% annual risk of stroke. The patient's score is based upon: CHF History: 0 HTN History: 1 Diabetes History: 0 Stroke History: 2 Vascular Disease  History: 1 Age Score: 0 Gender Score: 0      Physical Exam:    VS:  BP 120/70   Pulse 77   Ht '6\' 1"'$  (1.854 m)   Wt 280 lb (127 kg)   SpO2 93%   BMI 36.94 kg/m     Wt Readings from Last 3 Encounters:  04/22/22 280 lb (127 kg)  04/18/22 288 lb (130.6 kg)  04/11/22 288 lb 9.6 oz (130.9 kg)   Gen: Ill, Morbid Obesity  Neck: No JVD,  Ears: Bilateral Pilar Plate Sign Cardiac: No Rubs or Gallops, sytolic and diastolic murmur; distant heart sounds Respiratory: Bilateral wheeze normal effort, normal  respiratory rate GI: Soft, nontender, non-distended  MS: No  edema;  moves all extremities Integument: Skin feels warm Neuro:  At time of evaluation, alert and oriented to person/place/time/situation  Psych: Normal affect, patient feels ok   ASSESSMENT:    1. Paroxysmal atrial fibrillation with RVR ([place])   2. Nonrheumatic aortic valve insufficiency   3. Paroxysmal atrial fibrillation ([facility])   4. Aortic atherosclerosis ([place])   5. Essential hypertension   6. Morbid obesity ([facility])     PLAN:    PAF on Flecainide and Eliquis (no moderate  CADdisease, reviewed with patients primary EP given 1c agent) Acquired Thrombophilia Morbid Obesity with HTN OSA Pending CPAP Morbid obesity -increase to flecainide 100 and diltaizem 240; pending CPAP will help; referally to EP for consideration of repeat ablation (routine) - eliquis continue  AI and aortic sclerosis LVH - may be valve related, given septal thickness of 16 will need CT or CMR in future for confirmation (will plan CMR in 2024, unless he gets CT for PVI)   HLD(LDL goal  at least < 70) Aortic atherosclerosis 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  Concerns regarding medicines are outlined above.  No orders of the defined types were placed in this encounter.  Meds ordered this encounter  Medications   flecainide (TAMBOCOR) 100 MG tablet    Sig: Take 1 tablet (100 mg total) by mouth 2 (two) times daily.    Dispense:  180 tablet    Refill:  3    Patient Instructions  Medication Instructions:  Your physician has recommended you make the following change in your medication:  INCREASE: Flecainide to 100 mg by mouth twice daily  *If you need a refill on your cardiac medications before your next appointment, please call your pharmacy*   Lab Work: NONE If you have labs (blood work) drawn today and your tests are completely normal, you will receive your results only by: [place] (if you have MyChart) OR A paper copy in the mail If you have any lab test that is abnormal or we need to change your treatment, we will call you to review the results.   Testing/Procedures: Follow up with EP for discussion of Ablation.    IN 1 WEEK: Nurse Visit for EKG   Follow-Up: At [facility], you and your health needs are our priority.  As part of our continuing mission to provide you with exceptional heart care, we have created designated Provider Care Teams.  These Care Teams include your primary Cardiologist (physician) and Advanced Practice Providers (APPs -  Physician Assistants and Nurse Practitioners) who all work together to provide you with the care you need, when you need it.  We recommend signing up for the patient portal called "MyChart".  Sign up information is provided on this After Visit Summary.  MyChart is used to connect with patients for Virtual Visits (Telemedicine).  Patients are able to view lab/test results, encounter notes, upcoming appointments, etc.  Non-urgent messages can be sent to your provider as well.  To learn more about what you can do with MyChart, go to [email or web address].     Your next appointment:   6 month(s)  The format for your next appointment:   In Person  Provider:   Werner Lean, MD      Important Information About Sugar         Signed, Werner Lean, MD  04/22/2022 8:29 AM    [place]

## 2022-04-22 NOTE — Telephone Encounter (Signed)
Called [employer] again. PA for Repatha approved through 04/23/23. Patient made aware. Labs scheduled and pt advised to go to [email or web address] for copay card.

## 2022-04-22 NOTE — Telephone Encounter (Signed)
Transition Care Management Follow-up Telephone Call Date of discharge and from where: 04/19/22. [facility]. Dx: Bronchitis, A. Fib. How have you been since you were released from the hospital? I'm trying to get through all this that I have going on. Any questions or concerns? No  Items Reviewed: Did the pt receive and understand the discharge instructions provided? Yes  Medications obtained and verified? Yes  Other? No  Any new allergies since your discharge? No  Dietary orders reviewed? Yes Do you have support at home? Yes   Home Care and Equipment/Supplies: Were home health services ordered? not applicable If so, what is the name of the agency? N/a  Has the agency set up a time to come to the patient's home? not applicable Were any new equipment or medical supplies ordered?  No What is the name of the medical supply agency? N/a Were you able to get the supplies/equipment? not applicable Do you have any questions related to the use of the equipment or supplies? No  Functional Questionnaire: (I = Independent and D = Dependent) ADLs: I  Bathing/Dressing- I  Meal Prep- I  Eating- I  Maintaining continence- I  Transferring/Ambulation- I  Managing Meds- I  Follow up appointments reviewed:  PCP Hospital f/u appt confirmed? Yes  Scheduled to see Dr. [place] Silos on 03/30/22 @ 11:00am. [facility] f/u appt confirmed? Yes  Scheduled to see Dr. Gasper Sells (cardiology) on 10/30 @ 8:00am. Dr. Lake Bells (pulm.)04/24/22 @  9:00am Are transportation arrangements needed? No  If their condition worsens, is the pt aware to call PCP or go to the Emergency Dept.? Yes Was the patient provided with contact information for the PCP's office or ED? Yes Was to pt encouraged to call back with questions or concerns? Yes  Angeline Slim, RN, BSN RN Clinical Supervisor [facility] [employer]

## 2022-04-22 NOTE — Patient Instructions (Signed)
Medication Instructions:  Your physician has recommended you make the following change in your medication:  INCREASE: Flecainide to 100 mg by mouth twice daily  *If you need a refill on your cardiac medications before your next appointment, please call your pharmacy*   Lab Work: NONE If you have labs (blood work) drawn today and your tests are completely normal, you will receive your results only by: [place] (if you have MyChart) OR A paper copy in the mail If you have any lab test that is abnormal or we need to change your treatment, we will call you to review the results.   Testing/Procedures: Follow up with EP for discussion of Ablation.    IN 1 WEEK: Nurse Visit for EKG   Follow-Up: At [facility], you and your health needs are our priority.  As part of our continuing mission to provide you with exceptional heart care, we have created designated Provider Care Teams.  These Care Teams include your primary Cardiologist (physician) and Advanced Practice Providers (APPs -  Physician Assistants and Nurse Practitioners) who all work together to provide you with the care you need, when you need it.  We recommend signing up for the patient portal called "MyChart".  Sign up information is provided on this After Visit Summary.  MyChart is used to connect with patients for Virtual Visits (Telemedicine).  Patients are able to view lab/test results, encounter notes, upcoming appointments, etc.  Non-urgent messages can be sent to your provider as well.   To learn more about what you can do with MyChart, go to [email or web address].    Your next appointment:   6 month(s)  The format for your next appointment:   In Person  Provider:   Werner Lean, MD      Important Information About Sugar

## 2022-04-24 ENCOUNTER — Ambulatory Visit: Payer: Commercial Managed Care - HMO | Admitting: Pulmonary Disease

## 2022-04-24 ENCOUNTER — Encounter: Payer: Self-pay | Admitting: Pulmonary Disease

## 2022-04-24 ENCOUNTER — Other Ambulatory Visit: Payer: Self-pay | Admitting: Pulmonary Disease

## 2022-04-24 ENCOUNTER — Telehealth: Payer: Self-pay | Admitting: Family Medicine

## 2022-04-24 VITALS — BP 150/90 | HR 80 | Ht 73.0 in | Wt 283.0 lb

## 2022-04-24 DIAGNOSIS — J45909 Unspecified asthma, uncomplicated: Secondary | ICD-10-CM | POA: Diagnosis not present

## 2022-04-24 DIAGNOSIS — R059 Cough, unspecified: Secondary | ICD-10-CM | POA: Diagnosis not present

## 2022-04-24 DIAGNOSIS — J309 Allergic rhinitis, unspecified: Secondary | ICD-10-CM

## 2022-04-24 DIAGNOSIS — J410 Simple chronic bronchitis: Secondary | ICD-10-CM | POA: Diagnosis not present

## 2022-04-24 LAB — POCT EXHALED NITRIC OXIDE: FeNO level (ppb): 22

## 2022-04-24 MED ORDER — BENZONATATE 200 MG PO CAPS: 200.0000 mg | ORAL_CAPSULE | Freq: Three times a day (TID) | ORAL | 1 refills | Status: DC | PRN

## 2022-04-24 MED ORDER — BECLOMETHASONE DIPROP HFA 80 MCG/ACT IN AERB: 2.0000 | INHALATION_SPRAY | Freq: Two times a day (BID) | RESPIRATORY_TRACT | 2 refills | Status: DC

## 2022-04-24 NOTE — Telephone Encounter (Signed)
Sherri and I will review CT auth

## 2022-04-24 NOTE — Telephone Encounter (Signed)
Caller Name: [place] Call back phone #: [phone]  Reason for Call: Pt called in after receiving a letter from insurance. The CT scan ordered for him was filled under daughter's name. I have checked the insurance to [facility] and it seems to match up and is up to date. He is the 1st dependent on the [place] plan, daughter is 2nd dependent

## 2022-04-24 NOTE — Patient Instructions (Signed)
Chronic bronchitis: Most likely asthma Spirometry testing today Serum IgE Exhaled nitric oxide testing today Start taking Qvar 80 mcg 2 puffs twice a day no matter how you feel Continue using albuterol as needed for chest tightness wheezing or shortness of breath Use a flutter valve 4 to 5 breaths, 4-5 times a day to help get mucus out  Allergic rhinitis: Start taking over-the-counter cetirizine 10 mg daily Continue taking Flonase If no improvement after 4 to 5 days consider using saline sprays in each nostril 4-5 times a day  Trouble sleeping, daytime somnolence: Suspect obstructive sleep apnea At some point over the next 6 to 8 weeks we will arrange for a home sleep study but for now we will wait until your respiratory symptoms have improved.  Follow-up 2 weeks with nurse practitioner to see how you are doing with these measures

## 2022-04-25 NOTE — Telephone Encounter (Signed)
S/w [employer] this morning. A retro case has been started. Clinicals are being send via hard copy by Drue Dun. I was unable to send through ROI  case ID  [id number]   [phone]

## 2022-04-26 ENCOUNTER — Telehealth: Payer: Self-pay | Admitting: Pulmonary Disease

## 2022-04-26 LAB — IGE: IgE (Immunoglobulin E), Serum: 73 kU/L (ref <=114)

## 2022-04-26 NOTE — Telephone Encounter (Signed)
Called patient and he states that Qvar was denied by insurance. Can we run a ticket to see if a PA is needed or to see what is covered by insurance for patient  Thank you

## 2022-04-26 NOTE — Telephone Encounter (Signed)
Sent via ROI 11/2 8:45a

## 2022-04-29 ENCOUNTER — Other Ambulatory Visit (HOSPITAL_COMMUNITY): Payer: Self-pay

## 2022-04-30 ENCOUNTER — Ambulatory Visit: Payer: Commercial Managed Care - HMO | Admitting: Family Medicine

## 2022-04-30 ENCOUNTER — Encounter: Payer: Self-pay | Admitting: Family Medicine

## 2022-04-30 ENCOUNTER — Other Ambulatory Visit: Payer: Commercial Managed Care - HMO

## 2022-04-30 VITALS — BP 132/84 | HR 63 | Temp 97.0°F | Wt 284.0 lb

## 2022-04-30 DIAGNOSIS — J410 Simple chronic bronchitis: Secondary | ICD-10-CM

## 2022-04-30 DIAGNOSIS — I4891 Unspecified atrial fibrillation: Secondary | ICD-10-CM | POA: Diagnosis not present

## 2022-04-30 DIAGNOSIS — Z7689 Persons encountering health services in other specified circumstances: Secondary | ICD-10-CM

## 2022-04-30 DIAGNOSIS — Z09 Encounter for follow-up examination after completed treatment for conditions other than malignant neoplasm: Secondary | ICD-10-CM

## 2022-04-30 DIAGNOSIS — J40 Bronchitis, not specified as acute or chronic: Secondary | ICD-10-CM | POA: Diagnosis not present

## 2022-04-30 MED ORDER — ARNUITY ELLIPTA 100 MCG/ACT IN AEPB: 1.0000 | INHALATION_SPRAY | Freq: Every day | RESPIRATORY_TRACT | 1 refills | Status: DC

## 2022-04-30 NOTE — Telephone Encounter (Signed)
Patient called a few days ago and states QVAR was denied. So I reached out to pharmacy team and they state the following:  PA is asking: Has your patient tried any of the following preferred medications: Arnuity Ellipta, Asmanex/HFA, Flovent Diskus/HFA, or budesonide suspension for nebulizer-inhalation (Pulmicort Respules)?   I did not see anything in the current chart notes, Qvar will most likely be denied if patient has not tried or has a contraindication to these alternatives.    Please advise, thank you!

## 2022-04-30 NOTE — Telephone Encounter (Signed)
PA is asking: Has your patient tried any of the following preferred medications: Arnuity Ellipta, Asmanex/HFA, Flovent Diskus/HFA, or budesonide suspension for nebulizer-inhalation (Pulmicort Respules)?  I did not see anything in the current chart notes, Qvar will most likely be denied if patient has not tried or has a contraindication to these alternatives.   Please advise, thank you!

## 2022-04-30 NOTE — Progress Notes (Signed)
Assessment/Plan:   Problem List Items Addressed This Visit       Cardiovascular and Mediastinum   Atrial fibrillation with RVR ([place]) - Primary     Respiratory   Bronchitis   Other Visit Diagnoses     Encounter for weight management          Overall symptoms improved.  Encourage patient to try to transition from nebulizer to albuterol inhaler as needed but to still use nebulizer as a backup.  Encourage patient to see how the inhaled fluticasone improves symptoms.  Patient to follow-up with pulmonology and/or cardiology if worsening chest pain or shortness of breath.  Regarding weight management, patient limited on exercise due to respiratory disease.  He is trialing diet but has had mixed results with weight management.  Encourage patient to continue to try to advance physical exercise as tolerated but to allow patient time to heal.  Briefly discussed cardiac and health benefits of Mediterranean diet and provided information.    Subjective:  HPI:  Ethan Lambert is a 61 y.o. male who has HIV infection ([place]); Essential hypertension; Atrial fibrillation ([place]); Cerebral artery occlusion with cerebral infarction ([place]); VARICOCELE; HEMATOSPERMIA; INGUINAL HERNIORRHAPHIES, BILATERAL, HX OF; S/P right knee arthroscopy; Fatigue; HNP (herniated nucleus pulposus), lumbar; Encounter for long-term (current) use of medications; Shoulder pain, right; Acromioclavicular joint arthritis; Sprain of shoulder, right; Medication monitoring encounter; Subacromial bursitis of left shoulder joint; Paroxysmal atrial fibrillation ([place]); Rotator cuff syndrome of left shoulder; Acute medial meniscus tear, left, subsequent encounter; Morbid obesity ([place]); Bronchitis; Hyperlipidemia; Aortic atherosclerosis ([place]); Encounter for screening for infections with predominantly sexual mode of transmission; Nonrheumatic aortic valve insufficiency; Insomnia; Paroxysmal atrial fibrillation with RVR ([place]); and Atrial  fibrillation with RVR ([place]) on their problem list..   He  has a past medical history of Anemia, Arthritis, Childhood asthma, Clotting disorder ([place]), CVA (cerebral vascular accident) ([place]) (1999 X 2), Family history of malignant neoplasm of gastrointestinal tract, Heart murmur, History of blood transfusion (1999), History of kidney stones, HIV disease ([place]) (dx 1999), Hypertension, Paroxysmal atrial fibrillation ([place]), Right knee meniscal tear, Seizures ([place]), Skin cancer, and Sleep apnea.[name]   He presents with chief complaint of Hospitalization Follow-up (Bronchitis and Paroxysmal atrial fibrillation with RVR. Still has sob, congestion and wheezing. Seeing pulmonologist. ) .  Follow up Hospitalization  Patient was admitted to [facility] on 04/18/22 and discharged on 04/19/2022. He was treated for reactive airway disease/bronchitis and atrial fibrillation with RVR. Treatment for this included continuous nebulizers and rate control with diltiazem. Since discharge patient has followed up with both cardiology and pulmonology  Patient saw cardiology on 04/22/2022.  For A-fib with RVR cardiology increased patient's flecainide, continue diltiazem and continued Eliquis.  He has an upcoming assessment by EP to discuss possible repeat ablation.  Patient is seen by pulmonology on 04/24/2022.  Patient bronchitis was felt to be post COVID and related to untreated asthma.  Patient was started on inhaled steroids, initially prescribed Qvar, however insurance did not cover this, patient to start inhaled fluticasone today.  Patient has not yet picked up the prescription. Patient has been using home DuoNebs roughly once a day as needed for ongoing shortness of breath.  Patient reports that he is unable to work out or walk around due to dyspnea.  But this is improved with rest and nebulizers.  He denies any chest pain or palpitations today.  Home O2 sats typically range between 90-92. He reports good  compliance with treatment. He reports this  condition is improved.  He feels that he is better today than yesterday.  ----------------------------------------------------------------------------------------- -  Weight management.  Patient has wanted to become more active since his respiratory illness.  But cannot exercise due to shortness of breath.  Patient has been working on improved diet and has seen some weight loss at home.  Wt Readings from Last 3 Encounters:  04/30/22 284 lb (128.8 kg)  04/24/22 283 lb (128.4 kg)  04/22/22 280 lb (127 kg)     Past Surgical History:  Procedure Laterality Date   APPENDECTOMY  06-09-2003   ATRIAL FIBRILLATION ABLATION  05/28/2018   ATRIAL FIBRILLATION ABLATION N/A 05/28/2018   Procedure: ATRIAL FIBRILLATION ABLATION;  Surgeon: Kofi Murrell Grayer, MD;  Location: [facility];  Service: Cardiovascular;  Laterality: N/A;   BACK SURGERY     INGUINAL HERNIA REPAIR Bilateral 1990s   KNEE ARTHROSCOPY WITH MEDIAL MENISECTOMY Right 12/31/2013   Procedure: RIGHT KNEE ARTHROSCOPY PARTIAL MEDIAL MENISCECTOMY DEBRIDEMENT AND CHONDROPLASTY ;  Surgeon: Sydnee Cabal, MD;  Location: [facility];  Service: Orthopedics;  Laterality: Right;   KNEE ARTHROSCOPY WITH MEDIAL MENISECTOMY Left 01/27/2019   Procedure: LEFT KNEE ARTHROSCOPY WITH PARTIAL MEDIAL MENISCECTOMY;  Surgeon: Leandrew Koyanagi, MD;  Location: [facility];  Service: Orthopedics;  Laterality: Left;   LAPAROSCOPIC CHOLECYSTECTOMY  07-08-2003   LASER ABLATION ANAL CONDYLOMA  05-18-2001   LUMBAR LAMINECTOMY/DECOMPRESSION MICRODISCECTOMY Left 03/15/2015   Procedure: Left Lumbar five- sacral one microdiskectomy;  Surgeon: Consuella Lose, MD;  Location: [facility];  Service: Neurosurgery;  Laterality: Left;  Left L5S1 microdiskectomy   NEGATIVE SLEEP STUDY  01-03-2012  in epic   SKIN CANCER EXCISION Right    "cut it off my shoulder"   TRANSTHORACIC ECHOCARDIOGRAM  12-01-2008    MODERATE LVH/  EF 55-60%/  MILD MR/  NEGATIVE BUBBLE STUDY    Outpatient Medications Prior to Visit  Medication Sig Dispense Refill   albuterol (VENTOLIN HFA) 108 (90 Base) MCG/ACT inhaler Inhale 2 puffs into the lungs every 6 (six) hours as needed for wheezing or shortness of breath. 8 g 0   apixaban (ELIQUIS) 5 MG TABS tablet Take 1 tablet (5 mg total) by mouth 2 (two) times daily. 180 tablet 1   benzonatate (TESSALON) 200 MG capsule Take 1 capsule (200 mg total) by mouth 3 (three) times daily as needed for cough. 45 capsule 1   diltiazem (CARDIZEM CD) 240 MG 24 hr capsule TAKE 1 CAPSULE BY MOUTH EVERY DAY 90 capsule 3   diltiazem (CARDIZEM) 30 MG tablet Take 1 tablet every 4 hours AS NEEDED for afib heart rate >100 30 tablet 1   Evolocumab (REPATHA SURECLICK) 403 MG/ML SOAJ Inject 140 mg into the skin every 14 (fourteen) days. 2 mL 11   ezetimibe (ZETIA) 10 MG tablet Take 1 tablet (10 mg total) by mouth daily. 90 tablet 3   flecainide (TAMBOCOR) 100 MG tablet Take 1 tablet (100 mg total) by mouth 2 (two) times daily. 180 tablet 3   fluticasone (FLONASE) 50 MCG/ACT nasal spray Place 2 sprays into both nostrils daily. (Patient taking differently: Place 2 sprays into both nostrils daily as needed for allergies (congestion).) 16 g 6   Fluticasone Furoate (ARNUITY ELLIPTA) 100 MCG/ACT AEPB Inhale 1 puff into the lungs daily. 30 each 1   ipratropium-albuterol (DUONEB) 0.5-2.5 (3) MG/3ML SOLN Take 3 mLs by nebulization every 4 (four) hours as needed (wheezing). 540 mL 0   abacavir-dolutegravir-lamiVUDine (TRIUMEQ) 600-50-300 MG tablet Take 1  tablet by mouth daily. 30 tablet 11   QVAR REDIHALER 80 MCG/ACT inhaler INHALE 2 PUFFS INTO THE LUNGS TWICE A DAY (Patient not taking: Reported on 04/30/2022) 10.6 g 2   No facility-administered medications prior to visit.    Family History  Problem Relation Age of Onset   Arrhythmia Father    Melanoma Father    Heart attack Brother    Prostate cancer  Maternal Grandfather    Colon cancer Maternal Grandfather    Lung cancer Mother        smoker   Hypertension Mother    Hyperlipidemia Mother    Other Maternal Grandmother        harding of the arteries   Rectal cancer Neg Hx    Stomach cancer Neg Hx     Social History   Socioeconomic History   Marital status: Married    Spouse name: Not on file   Number of children: 1   Years of education: Not on file   Highest education level: Not on file  Occupational History   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   Social Determinants of Health   Financial Resource Strain: Not on file  Food Insecurity: No Food Insecurity (04/18/2022)   Hunger Vital Sign    Worried About Running Out of Food in the Last Year: Never true    [place] in the Last Year: Never true  Transportation Needs: No Transportation Needs (04/18/2022)   PRAPARE - Hydrologist (Medical): No    Lack of Transportation (Non-Medical): No  Physical Activity: Not on file  Stress: Not on file  Social Connections: Not on file  Intimate Partner Violence: Not At Risk (04/18/2022)   Humiliation, Afraid, Rape, and Kick questionnaire    Fear of Current or Ex-Partner: No    Emotionally Abused: No    Physically Abused: No    Sexually Abused: No                                                                                                 Objective:  Physical Exam: BP 132/84  (BP Location: Right Arm, Patient Position: Sitting, Cuff Size: Large)   Pulse 63   Temp (!) 97 F (36.1 C) (Temporal)   Wt 284 lb (128.8 kg)   SpO2 93%   BMI 37.47 kg/m    General: No acute distress. Awake and conversant.  Eyes: Normal conjunctiva, anicteric. Round symmetric pupils.  ENT: Hearing grossly  intact. No nasal discharge.  Neck: Neck is supple. No masses or thyromegaly.  Respiratory: Respirations are non-labored. No auditory wheezing.  CTA B Skin: Warm. No rashes or ulcers.  Psych: Alert and oriented. Cooperative, Appropriate mood and affect, Normal judgment.  CV: No cyanosis or JVD, RRR, no MRG MSK: Normal ambulation. No clubbing  Neuro: Sensation and CN II-XII grossly normal.        Alesia Banda, MD, MS

## 2022-04-30 NOTE — Telephone Encounter (Signed)
Called and spoke to patient about new medication Dr Pennie Banter is suggesting. I told him to ty it for 1-2 weeks and to call us with an update. Nothing further needed

## 2022-05-01 ENCOUNTER — Telehealth: Payer: Self-pay | Admitting: Internal Medicine

## 2022-05-01 ENCOUNTER — Ambulatory Visit: Payer: Commercial Managed Care - HMO | Attending: Cardiology

## 2022-05-01 VITALS — Ht 73.0 in | Wt 284.2 lb

## 2022-05-01 DIAGNOSIS — I480 Paroxysmal atrial fibrillation: Secondary | ICD-10-CM

## 2022-05-01 NOTE — Patient Instructions (Signed)
Medication Instructions:  Your physician recommends that you continue on your current medications as directed. Please refer to the Current Medication list given to you today.  *If you need a refill on your cardiac medications before your next appointment, please call your pharmacy*   Lab Work: NONE If you have labs (blood work) drawn today and your tests are completely normal, you will receive your results only by: [place] (if you have MyChart) OR A paper copy in the mail If you have any lab test that is abnormal or we need to change your treatment, we will call you to review the results.   Testing/Procedures: NONE   Follow-Up:As Scheduled At [facility], you and your health needs are our priority.  As part of our continuing mission to provide you with exceptional heart care, we have created designated Provider Care Teams.  These Care Teams include your primary Cardiologist (physician) and Advanced Practice Providers (APPs -  Physician Assistants and Nurse Practitioners) who all work together to provide you with the care you need, when you need it.  We recommend signing up for the patient portal called "MyChart".  Sign up information is provided on this After Visit Summary.  MyChart is used to connect with patients for Virtual Visits (Telemedicine).  Patients are able to view lab/test results, encounter notes, upcoming appointments, etc.  Non-urgent messages can be sent to your provider as well.   To learn more about what you can do with MyChart, go to [email or web address].     Important Information About Sugar

## 2022-05-01 NOTE — Progress Notes (Signed)
   Nurse Visit   Date of Encounter: 05/01/2022 ID: [name], DOB 21-Jul-1960, MRN [id number]  PCP:  Bonnita Hollow, MD   [place] Providers Cardiologist:  Werner Lean, MD Electrophysiologist:  Thompson Grayer, MD      Visit Details   VS:  Ht '6\' 1"'$  (1.854 m)   Wt 284 lb 3.2 oz (128.9 kg)   BMI 37.50 kg/m  , BMI Body mass index is 37.5 kg/m.  Wt Readings from Last 3 Encounters:  05/01/22 284 lb 3.2 oz (128.9 kg)  04/30/22 284 lb (128.8 kg)  04/24/22 283 lb (128.4 kg)     Reason for visit: EKG, due to Flecainide 100 mg PO BID start on 04/22/22 Performed today: EKG, Provider consulted:Dr. Curt Bears, and Education; Advised pt per MD continue Flecainide as ordered. Changes (medications, testing, etc.) : No changes  Length of Visit: 20 minutes    Medications Adjustments/Labs and Tests Ordered: No orders of the defined types were placed in this encounter.  No orders of the defined types were placed in this encounter.    Signed, Precious Gilding, RN  05/01/2022 11:29 AM

## 2022-05-01 NOTE — Telephone Encounter (Signed)
Maya from [employer] calling to confirm we received their request for medical records

## 2022-05-03 LAB — RESPIRATORY CULTURE OR RESPIRATORY AND SPUTUM CULTURE
MICRO NUMBER:: 14154798
SPECIMEN QUALITY:: ADEQUATE

## 2022-05-03 NOTE — Telephone Encounter (Signed)
Called pt notified our office has not received paperwork from the [facility].  Pt will call [place] and request paperwork be faxed over again.

## 2022-05-06 ENCOUNTER — Other Ambulatory Visit (HOSPITAL_COMMUNITY): Payer: Self-pay

## 2022-05-06 NOTE — Telephone Encounter (Signed)
Called Maya with the [employer] full unable to leave a message.  Called pt notified of inability to leave a message.  Pt called last week and will call again for follow up.

## 2022-05-06 NOTE — Telephone Encounter (Signed)
Pt is called back, he said, the information for [facility] fax [phone]

## 2022-05-07 ENCOUNTER — Other Ambulatory Visit: Payer: Self-pay

## 2022-05-07 MED ORDER — DOXYCYCLINE HYCLATE 100 MG PO TABS: 100.0000 mg | ORAL_TABLET | Freq: Two times a day (BID) | ORAL | 0 refills | Status: DC

## 2022-05-07 NOTE — Telephone Encounter (Signed)
Spoke with pt and addressed all questions. Nothing further needed at this time.

## 2022-05-08 ENCOUNTER — Ambulatory Visit: Payer: Commercial Managed Care - HMO | Admitting: Primary Care

## 2022-05-08 ENCOUNTER — Other Ambulatory Visit: Payer: Self-pay | Admitting: Family Medicine

## 2022-05-08 ENCOUNTER — Encounter: Payer: Self-pay | Admitting: Primary Care

## 2022-05-08 VITALS — BP 144/90 | HR 68 | Ht 73.0 in | Wt 282.8 lb

## 2022-05-08 DIAGNOSIS — R0683 Snoring: Secondary | ICD-10-CM

## 2022-05-08 DIAGNOSIS — J45909 Unspecified asthma, uncomplicated: Secondary | ICD-10-CM | POA: Insufficient documentation

## 2022-05-08 DIAGNOSIS — J4530 Mild persistent asthma, uncomplicated: Secondary | ICD-10-CM

## 2022-05-08 DIAGNOSIS — R002 Palpitations: Secondary | ICD-10-CM

## 2022-05-08 DIAGNOSIS — J40 Bronchitis, not specified as acute or chronic: Secondary | ICD-10-CM

## 2022-05-08 DIAGNOSIS — R0609 Other forms of dyspnea: Secondary | ICD-10-CM

## 2022-05-08 DIAGNOSIS — U099 Post covid-19 condition, unspecified: Secondary | ICD-10-CM

## 2022-05-08 DIAGNOSIS — R062 Wheezing: Secondary | ICD-10-CM

## 2022-05-08 NOTE — Assessment & Plan Note (Addendum)
-   Patient symptoms of loud snoring along with daytime sleepiness and witnessed apnea. Epworth 19. Needs home sleep study to evaluate for OSA.

## 2022-05-08 NOTE — Progress Notes (Signed)
Reviewed, agree 

## 2022-05-08 NOTE — Telephone Encounter (Signed)
Chart supports rx. Last OV: 04/30/2022

## 2022-05-08 NOTE — Progress Notes (Signed)
$'@Patient'F$  ID: Ethan Lambert, male    DOB: Jan 11, 1961, 61 y.o.   MRN: [id number]  No chief complaint on file.   Referring provider: Bonnita Hollow, MD  HPI: 61 year old male, never smoked. PMH significant for HTN, afib, bronchitis, obesity. Patient of Dr. Lake Bells, seen for initial consult on 04/24/22.  Previous [facility] pulmonary encounter: Ruslan says that he started coughing 10/1.  He has sinus congestion and is coughing out a lot of mucus.  He has struggled to sleep over the years (has had negative sleep studies) but now he can't sleep at all, only getting 3-4 hours of sleep while sitting up.  He says that coughing is keeping him awake.  Laying flat is a big trigger of the illness.  Mucus in his throat and chest is making it worse.  He's had bronchitis in the past but nothing like this.  He has been taking breathing treatments at home, he's using mucinex and a nose spray (flonase).  He had COVID 2 weeks prior to the cough starting.  He says that he had a cough, but it wasn't that bad.   He ended up in the ER for this recently and had several breathing treatments which put him into atrial fibrillation and he required hospitalization.  He says that tsince then the wheezing hasn't really improved.  He has been using albuterol once a day and mucinex and this helps.    He has mild heartburn, treated successfully with TUMS.   Never smoked.  He is retired from the [employer].  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   He has occassional trouble breathing when climbing stairs, he can walk on level ground.  He is not able to do any yard work.  He says that when he was a child he had asthma.  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  He says that when he would run, play football he had burning in his chest and trouble breathing.  He has  atrial fibrillation and has had an ablation for the same.   October 2023 Hospital records reviewed including discharge summary.  Patient was treated for bronchitis.  Discharged after treatment with bronchodilators and mucolytic's.  No antibiotics used.  Takes flecainide and apixaban for A-fib.  He was treated with steroids.     05/08/2022- interim hx  Patient presents today for 2 week follow-up/ chronic bronchitis. Felt likely yo have asthma. Started on Qvar 21mg two puffs twice daily and prn albuterol. Advised to start using flutter valve and over the counter cetirizine '10mg'$  daily along with flonase. Sputum culture grew out group F strep, treated with abx.   He is feeling significantly better.  Dyspnea improved. Less wheezing and mucus production. He started Doxycyline yesterday. Using flutter valve as directed.   Strongly suspect patient has underlying obstructive sleep apnea, he has had a couple sleep studies in the past but it has been awhile. He reports loud snoring, daytime sleepiness, restless sleep and witnessed apnea.     Allergies  Allergen Reactions   Ms Contin [Morphine] Other (See Comments)    Hallucinations     Immunization History  Administered Date(s) Administered   H1N1 09/15/2008   Hepatitis B 08/05/2000, 03/25/2002, 09/30/2002   Influenza Split 04/07/2012, 03/14/2016   Influenza Whole 03/27/2006, 04/14/2008, 04/07/2009,  04/04/2010   Influenza, Seasonal, Injecte, Preservative Fre 04/08/2015   Influenza,inj,Quad PF,6+ Mos 05/12/2013, 04/06/2017, 05/12/2019, 04/12/2020, 04/19/2022   Janssen (J&J) SARS-COV-2 Vaccination 05/15/2020   PFIZER Comirnaty(Gray Top)Covid-19 Tri-Sucrose Vaccine 09/01/2020   Pneumococcal Conjugate-13 07/29/2019   Pneumococcal Polysaccharide-23 09/08/2001, 12/10/2007, 11/10/2012   Tdap 03/14/2016    Past Medical History:  Diagnosis Date   Anemia    Arthritis    "probably right ring finger joint" (05/28/2018)   Childhood asthma    as  child none now   Clotting disorder ([place])    CVA (cerebral vascular accident) ([place]) 1999 X 2   LEFT FRONTAL & OCCIPITAL , NON-HEMORRHAGIC; no residual   Family history of malignant neoplasm of gastrointestinal tract    Heart murmur    History of blood transfusion 1999   "related to the stroke"   History of kidney stones    HIV disease ([place]) dx 1999   MONITORED BY INFECTIOUS DISEASE -- DR Herbie [place] [facility]   Hypertension    Paroxysmal atrial fibrillation ([place])    CARDIOLOGIST--  DR Rayann Heman   Right knee meniscal tear    Seizures ([place])    1999   Skin cancer    skin   Sleep apnea    "didn't follow thru w/mask" (05/28/2018)    Tobacco History: Social History   Tobacco Use  Smoking Status Never   Passive exposure: Never  Smokeless Tobacco Never   Counseling given: Not Answered   Outpatient Medications Prior to Visit  Medication Sig Dispense Refill   Fluticasone Furoate (ARNUITY ELLIPTA) 100 MCG/ACT AEPB Inhale 1 puff into the lungs daily. 30 each 1   ipratropium-albuterol (DUONEB) 0.5-2.5 (3) MG/3ML SOLN Take 3 mLs by nebulization every 4 (four) hours as needed (wheezing). 540 mL 0   albuterol (VENTOLIN HFA) 108 (90 Base) MCG/ACT inhaler Inhale 2 puffs into the lungs every 6 (six) hours as needed for wheezing or shortness of breath. 8 g 0   abacavir-dolutegravir-lamiVUDine (TRIUMEQ) 600-50-300 MG tablet Take 1 tablet by mouth daily. 30 tablet 11   apixaban (ELIQUIS) 5 MG TABS tablet Take 1 tablet (5 mg total) by mouth 2 (two) times daily. 180 tablet 1   benzonatate (TESSALON) 200 MG capsule Take 1 capsule (200 mg total) by mouth 3 (three) times daily as needed for cough. (Patient not taking: Reported on 05/08/2022) 45 capsule 1   diltiazem (CARDIZEM CD) 240 MG 24 hr capsule TAKE 1 CAPSULE BY MOUTH EVERY DAY (Patient not taking: Reported on 05/08/2022) 90 capsule 3   diltiazem (CARDIZEM) 30 MG tablet Take 1 tablet every 4 hours AS NEEDED for afib heart rate >100 30 tablet 1   doxycycline  (VIBRA-TABS) 100 MG tablet Take 1 tablet (100 mg total) by mouth 2 (two) times daily. 10 tablet 0   Evolocumab (REPATHA SURECLICK) 053 MG/ML SOAJ Inject 140 mg into the skin every 14 (fourteen) days. 2 mL 11   ezetimibe (ZETIA) 10 MG tablet Take 1 tablet (10 mg total) by mouth daily. 90 tablet 3   flecainide (TAMBOCOR) 100 MG tablet Take 1 tablet (100 mg total) by mouth 2 (two) times daily. 180 tablet 3   fluticasone (FLONASE) 50 MCG/ACT nasal spray Place 2 sprays into both nostrils daily. (Patient taking differently: Place 2 sprays into both nostrils daily as needed for allergies (congestion).) 16 g 6   No facility-administered medications prior to visit.   Review of Systems  Review of Systems  Constitutional: Negative.   HENT: Negative.  Negative for congestion.  Respiratory: Negative.  Negative for cough, shortness of breath and wheezing.   Cardiovascular: Negative.    Physical Exam  BP (!) 144/90 (BP Location: Right Arm)   Pulse 68   Ht '6\' 1"'$  (1.854 m)   Wt 282 lb 12.8 oz (128.3 kg)   SpO2 94%   BMI 37.31 kg/m  Physical Exam Constitutional:      General: He is not in acute distress.    Appearance: Normal appearance. He is not ill-appearing.  HENT:     Head: Normocephalic and atraumatic.     Mouth/Throat:     Mouth: Mucous membranes are moist.     Pharynx: Oropharynx is clear.  Cardiovascular:     Rate and Rhythm: Normal rate and regular rhythm.  Pulmonary:     Effort: Pulmonary effort is normal.     Breath sounds: Normal breath sounds. No wheezing, rhonchi or rales.  Musculoskeletal:        General: Normal range of motion.  Skin:    General: Skin is warm and dry.  Neurological:     General: No focal deficit present.     Mental Status: He is alert and oriented to person, place, and time. Mental status is at baseline.  Psychiatric:        Mood and Affect: Mood normal.        Behavior: Behavior normal.        Thought Content: Thought content normal.         Judgment: Judgment normal.      Lab Results:  CBC    Component Value Date/Time   WBC 9.4 04/19/2022 0203   RBC 4.70 04/19/2022 0203   HGB 14.6 04/19/2022 0203   HGB 15.5 10/25/2019 0940   HCT 43.2 04/19/2022 0203   HCT 44.5 10/25/2019 0940   PLT 265 04/19/2022 0203   PLT 261 10/25/2019 0940   MCV 91.9 04/19/2022 0203   MCV 90 10/25/2019 0940   MCH 31.1 04/19/2022 0203   MCHC 33.8 04/19/2022 0203   RDW 13.4 04/19/2022 0203   RDW 13.1 10/25/2019 0940   LYMPHSABS 2.2 04/18/2022 0859   LYMPHSABS 2.4 05/11/2018 1022   MONOABS 0.9 04/18/2022 0859   EOSABS 0.3 04/18/2022 0859   EOSABS 0.1 05/11/2018 1022   BASOSABS 0.1 04/18/2022 0859   BASOSABS 0.1 05/11/2018 1022    BMET    Component Value Date/Time   NA 137 04/19/2022 0203   NA 138 10/25/2019 0940   K 4.2 04/19/2022 0203   CL 102 04/19/2022 0203   CO2 25 04/19/2022 0203   GLUCOSE 166 (H) 04/19/2022 0203   BUN 15 04/19/2022 0203   BUN 17 10/25/2019 0940   CREATININE 0.95 04/19/2022 0203   CREATININE 0.89 10/31/2021 0853   CALCIUM 9.4 04/19/2022 0203   GFRNONAA >60 04/19/2022 0203   GFRNONAA 77 08/15/2020 0920   GFRAA 90 08/15/2020 0920    BNP    Component Value Date/Time   BNP 10.7 04/18/2022 0859    ProBNP    Component Value Date/Time   PROBNP 12.0 04/11/2022 1013    Imaging: DG Chest Port 1 View  Result Date: 04/18/2022 CLINICAL DATA:  61 year old male with shortness of breath, wheezing and congestion. EXAM: PORTABLE CHEST 1 VIEW COMPARISON:  Chest CT 04/12/2022 and earlier. FINDINGS: Portable AP semi upright view at 0858 hours. Stable to mildly lower lung volumes. Mediastinal contours remain normal. Visualized tracheal air column is within normal limits. Negative portable appearance of the right lung. Streaky lingula peribronchial  opacity which on the recent CT most resembles atelectasis or scarring. No pneumothorax or pleural effusion. Negative visible osseous structures. IMPRESSION: Mild lingula  scarring or atelectasis. No acute cardiopulmonary abnormality. Electronically Signed   By: Genevie Ann M.D.   On: 04/18/2022 09:18   CT CHEST W CONTRAST  Addendum Date: 04/12/2022   ADDENDUM REPORT: 04/12/2022 09:07 ADDENDUM: Corrected report: Original report was generated with an error due to voice recognition software, corrected as follows: No consolidation, pleural effusion or pneumothorax. Electronically Signed   By: Yetta Glassman M.D.   On: 04/12/2022 09:07   Result Date: 04/12/2022 CLINICAL DATA:  Chronic cough with congestion past 4 days EXAM: CT CHEST WITH CONTRAST TECHNIQUE: Multidetector CT imaging of the chest was performed during intravenous contrast administration. RADIATION DOSE REDUCTION: This exam was performed according to the departmental dose-optimization program which includes automated exposure control, adjustment of the mA and/or kV according to patient size and/or use of iterative reconstruction technique. CONTRAST:  54m OMNIPAQUE IOHEXOL 300 MG/ML  SOLN COMPARISON:  CT heart dated October 05, 2020 and May 26, 2018 FINDINGS: Cardiovascular: Normal heart size. Pericardial effusion. Aortic valve calcifications. Normal caliber thoracic aorta with mild calcified plaque. No suspicious filling defects of the central pulmonary arteries. Mediastinum/Nodes: Small hiatal hernia. Thyroid is unremarkable. No pathologically enlarged lymph nodes seen in the chest. Lungs/Pleura: Central airways are patent. Mild linear opacities of the right middle lobe and lingula, likely due to scarring or atelectasis. Consolidation, pleural effusion or pneumothorax. Small solid pulmonary nodule of the right lower lobe measuring 5 mm on series 3 image 88, unchanged in size when compared with May 26, 2018 exam, no further follow-up imaging is necessary given greater than 1 year stability. Upper Abdomen: Cholecystectomy clips.  No acute abnormality. Musculoskeletal: No chest wall abnormality. No acute or  significant osseous findings. IMPRESSION: 1. No acute findings in the chest. 2. Aortic valve calcifications, findings can be seen in the setting of aortic stenosis. Recommend correlation with echocardiography. 3. Mild aortic Atherosclerosis (ICD10-I70.0). Electronically Signed: By: LYetta GlassmanM.D. On: 04/12/2022 08:12     Assessment & Plan:   Bronchitis - Chronic bronchitis. Sputum culture positive for group F strep, treated with Doxcycycine '100mg'$  BID x 5 days. Patient reports less mucus burden. Advised he continue abx until complete and use flutter valve 2-3 times daily as directed.   Asthma - Improvement in wheezing and dyspnea with addition of ICS - Continue Arnuity Ellipta one puff daily and prn albuterol 2 puffs q 4-6 hours  - Continue Cetirizine and Flonase   Loud snoring - Patient symptoms of loud snoring along with daytime sleepiness and witnessed apnea. Ep[place] 19. Needs home sleep study to evaluate for OSA.      EMartyn Ehrich NP 05/08/2022

## 2022-05-08 NOTE — Patient Instructions (Signed)
Recommendations: - Continue Arnuity - Continue Flonase nasal spray - Continue doxycycline until complete - Continue flutter valve 3 times a day - We will check repeat home sleep testing  Orders: - Home sleep test   Follow-up: - Return in 3 months with Dr. Lake Bells or sooner    Asthma, Adult  Asthma is a condition that causes swelling and narrowing of the airways. These are the passages that lead from the nose and mouth down into the lungs. When asthma symptoms get worse it is called an asthma attack or flare. This can make it hard to breathe. Asthma flares can range from minor to life-threatening. There is no cure for asthma, but medicines and lifestyle changes can help to control it. What are the causes? It is not known exactly what causes asthma, but certain things can cause asthma symptoms to get worse (triggers). What can trigger an asthma attack? Cigarette smoke. Mold. Dust. Your pet's skin flakes (dander). Cockroaches. Pollen. Air pollution (like household cleaners, wood smoke, smog, or Advertising account planner). What are the signs or symptoms? Trouble breathing (shortness of breath). Coughing. Making high-pitched whistling sounds when you breathe, most often when you breathe out (wheezing). Chest tightness. Tiredness with little activity. Poor exercise tolerance. How is this treated? Controller medicines that help prevent asthma symptoms. Fast-acting reliever or rescue medicines. These give short-term relief of asthma symptoms. Allergy medicines if your attacks are brought on by allergens. Medicines to help control the body's defense (immune) system. Staying away from the things that cause asthma attacks. Follow these instructions at home: Avoiding triggers in your home Do not allow anyone to smoke in your home. Limit use of fireplaces and wood stoves. Get rid of pests (such as roaches and mice) and their droppings. Keep your home clean. Clean your floors. Dust regularly.  Use cleaning products that do not smell. Wash bed sheets and blankets every week in hot water. Dry them in a dryer. Have someone vacuum when you are not home. Change your heating and air conditioning filters often. Use blankets that are made of polyester or cotton. General instructions Take over-the-counter and prescription medicines only as told by your doctor. Do not smoke or use any products that contain nicotine or tobacco. If you need help quitting, ask your doctor. Stay away from secondhand smoke. Avoid doing things outdoors when allergen counts are high and when air quality is low. Warm up before you exercise. Take time to cool down after exercise. Use a peak flow meter as told by your doctor. A peak flow meter is a tool that measures how well your lungs are working. Keep track of the peak flow meter's readings. Write them down. Follow your asthma action plan. This is a written plan for taking care of your asthma and treating your attacks. Make sure you get all the shots (vaccines) that your doctor recommends. Ask your doctor about a flu shot and a pneumonia shot. Keep all follow-up visits. Contact a doctor if: You have wheezing, shortness of breath, or a cough even while taking medicine to prevent attacks. The mucus you cough up (sputum) is thicker than usual. The mucus you cough up changes from clear or white to yellow, green, gray, or is bloody. You have problems from the medicine you are taking, such as: A rash. Itching. Swelling. Trouble breathing. You need reliever medicines more than 2-3 times a week. Your peak flow reading is still at 50-79% of your personal best after following the action plan for 1 hour.  You have a fever. Get help right away if: You seem to be worse and are not responding to medicine during an asthma attack. You are short of breath even at rest. You get short of breath when doing very little activity. You have trouble eating, drinking, or  talking. You have chest pain or tightness. You have a fast heartbeat. Your lips or fingernails start to turn blue. You are light-headed or dizzy, or you faint. Your peak flow is less than 50% of your personal best. You feel too tired to breathe normally. These symptoms may be an emergency. Get help right away. Call 911. Do not wait to see if the symptoms will go away. Do not drive yourself to the hospital. Summary Asthma is a long-term (chronic) condition in which the airways get tight and narrow. An asthma attack can make it hard to breathe. Asthma cannot be cured, but medicines and lifestyle changes can help control it. Make sure you understand how to avoid triggers and how and when to use your medicines. Avoid things that can cause allergy symptoms (allergens). These include animal skin flakes (dander) and pollen from trees or grass. Avoid things that pollute the air. These may include household cleaners, wood smoke, smog, or chemical odors. This information is not intended to replace advice given to you by your health care provider. Make sure you discuss any questions you have with your health care provider. Document Revised: 03/19/2021 Document Reviewed: 03/19/2021 Elsevier Patient Education  [employer].

## 2022-05-08 NOTE — Assessment & Plan Note (Signed)
-   Chronic bronchitis. Sputum culture positive for group F strep, treated with Doxcycycine '100mg'$  BID x 5 days. Patient reports less mucus burden. Advised he continue abx until complete and use flutter valve 2-3 times daily as directed.

## 2022-05-08 NOTE — Telephone Encounter (Signed)
Called left a message for Stephannie Li claim representative with the [employer] [place].  Advised that our office still has not received paperwork for disability claim.  Left call back number.

## 2022-05-08 NOTE — Assessment & Plan Note (Signed)
-   Improvement in wheezing and dyspnea with addition of ICS - Continue Arnuity Ellipta one puff daily and prn albuterol 2 puffs q 4-6 hours  - Continue Cetirizine and Flonase

## 2022-05-09 ENCOUNTER — Encounter: Payer: Self-pay | Admitting: Cardiovascular Disease

## 2022-05-09 ENCOUNTER — Ambulatory Visit: Payer: Commercial Managed Care - HMO | Attending: Cardiovascular Disease | Admitting: Cardiovascular Disease

## 2022-05-09 VITALS — BP 120/76 | HR 62 | Ht 73.0 in | Wt 283.4 lb

## 2022-05-09 DIAGNOSIS — I480 Paroxysmal atrial fibrillation: Secondary | ICD-10-CM

## 2022-05-09 NOTE — Progress Notes (Signed)
Electrophysiology Office Note:    Date:  05/09/2022   ID:  [place], DOB 03-23-1961, MRN [id number]  PCP:  Bonnita Hollow, MD   [place] [facility] Cardiologist:  Werner Lean, MD Electrophysiologist:  Melida Quitter, MD     Referring MD: Bonnita Hollow, MD   Chief complaint: atrial fibrillation  History of Present Illness:    E[place] is a 61 y.o. male with a hx of PAF s/p ablation, subsequently managed with flecainide as needed  He had an AF ablation by Dr. Rayann Heman in 2019. He resumed flecainide 50 when he had recurrence. He has recently begun to have recurrences again, so flecainide was increased to '100mg'$  PO BID. This helped with the burden and intensity of episodes, but he continues to still have symptoms.  He is trying to lose weight and has been making some progress, he reports. He is currently being evaluated by his pulmonologist for sleep apnea.  He does not have chest pain, syncope, pre-syncope.  Past Medical History:  Diagnosis Date   Anemia    Arthritis    "probably right ring finger joint" (05/28/2018)   Childhood asthma    as child none now   Clotting disorder ([place])    CVA (cerebral vascular accident) ([place]) 1999 X 2   LEFT FRONTAL & OCCIPITAL , NON-HEMORRHAGIC; no residual   Family history of malignant neoplasm of gastrointestinal tract    Heart murmur    History of blood transfusion 1999   "related to the stroke"   History of kidney stones    HIV disease ([place]) dx 1999   MONITORED BY INFECTIOUS DISEASE -- DR Lawson   Hypertension    Paroxysmal atrial fibrillation ([place])    EP -- Dr. Myles Gip   Right knee meniscal tear    Seizures ([place])    1999   Skin cancer    skin   Sleep apnea    "didn't follow thru w/mask" (05/28/2018)    Past Surgical History:  Procedure Laterality Date   APPENDECTOMY  06-09-2003   ATRIAL FIBRILLATION ABLATION  05/28/2018   ATRIAL FIBRILLATION ABLATION N/A 05/28/2018   Procedure:  ATRIAL FIBRILLATION ABLATION;  Surgeon: Thompson Grayer, MD;  Location: [facility];  Service: Cardiovascular;  Laterality: N/A;   BACK SURGERY     INGUINAL HERNIA REPAIR Bilateral 1990s   KNEE ARTHROSCOPY WITH MEDIAL MENISECTOMY Right 12/31/2013   Procedure: RIGHT KNEE ARTHROSCOPY PARTIAL MEDIAL MENISCECTOMY DEBRIDEMENT AND CHONDROPLASTY ;  Surgeon: Sydnee Cabal, MD;  Location: [facility];  Service: Orthopedics;  Laterality: Right;   KNEE ARTHROSCOPY WITH MEDIAL MENISECTOMY Left 01/27/2019   Procedure: LEFT KNEE ARTHROSCOPY WITH PARTIAL MEDIAL MENISCECTOMY;  Surgeon: Leandrew Koyanagi, MD;  Location: [facility];  Service: Orthopedics;  Laterality: Left;   LAPAROSCOPIC CHOLECYSTECTOMY  07-08-2003   LASER ABLATION ANAL CONDYLOMA  05-18-2001   LUMBAR LAMINECTOMY/DECOMPRESSION MICRODISCECTOMY Left 03/15/2015   Procedure: Left Lumbar five- sacral one microdiskectomy;  Surgeon: Consuella Lose, MD;  Location: [facility];  Service: Neurosurgery;  Laterality: Left;  Left L5S1 microdiskectomy   NEGATIVE SLEEP STUDY  01-03-2012  in epic   SKIN CANCER EXCISION Right    "cut it off my shoulder"   TRANSTHORACIC ECHOCARDIOGRAM  12-01-2008   MODERATE LVH/  EF 55-60%/  MILD MR/  NEGATIVE BUBBLE STUDY    Current Medications: Current Meds  Medication Sig   abacavir-dolutegravir-lamiVUDine (TRIUMEQ) 600-50-300 MG tablet Take 1 tablet by mouth daily.  albuterol (VENTOLIN HFA) 108 (90 Base) MCG/ACT inhaler TAKE 2 PUFFS BY MOUTH EVERY 6 HOURS AS NEEDED FOR WHEEZE OR SHORTNESS OF BREATH   apixaban (ELIQUIS) 5 MG TABS tablet Take 1 tablet (5 mg total) by mouth 2 (two) times daily.   benzonatate (TESSALON) 200 MG capsule Take 1 capsule (200 mg total) by mouth 3 (three) times daily as needed for cough.   diltiazem (CARDIZEM CD) 240 MG 24 hr capsule TAKE 1 CAPSULE BY MOUTH EVERY [place]   diltiazem (CARDIZEM) 30 MG tablet Take 1 tablet every 4 hours AS NEEDED for afib heart rate >100    doxycycline (VIBRA-TABS) 100 MG tablet Take 1 tablet (100 mg total) by mouth 2 (two) times daily.   Evolocumab (REPATHA SURECLICK) 258 MG/ML SOAJ Inject 140 mg into the skin every 14 (fourteen) days.   ezetimibe (ZETIA) 10 MG tablet Take 1 tablet (10 mg total) by mouth daily.   flecainide (TAMBOCOR) 100 MG tablet Take 1 tablet (100 mg total) by mouth 2 (two) times daily.   fluticasone (FLONASE) 50 MCG/ACT nasal spray Place 2 sprays into both nostrils daily. (Patient taking differently: Place 2 sprays into both nostrils daily as needed for allergies (congestion).)   Fluticasone Furoate (ARNUITY ELLIPTA) 100 MCG/ACT AEPB Inhale 1 puff into the lungs daily.   ipratropium-albuterol (DUONEB) 0.5-2.5 (3) MG/3ML SOLN Take 3 mLs by nebulization every 4 (four) hours as needed (wheezing).     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   Social Determinants of Health   Financial Resource Strain: Not on file  Food Insecurity: No Food Insecurity (04/18/2022)   Hunger Vital Sign    Worried About Running Out of Food in the Last Year: Never true    [place] in the Last Year: Never true  Transportation Needs: No Transportation Needs  (04/18/2022)   PRAPARE - Hydrologist (Medical): No    Lack of Transportation (Non-Medical): No  Physical Activity: Not on file  Stress: Not on file  Social Connections: Not on file     Family History: The patient's family history includes Arrhythmia in his father; Colon cancer in his maternal grandfather; Heart attack in his brother; Hyperlipidemia in his mother; Hypertension in his mother; Lung cancer in his mother; Melanoma in his father; Other in his maternal grandmother; Prostate cancer in his maternal grandfather. There is no history of Rectal cancer or Stomach cancer.  ROS:   Please see the history of present illness.    All other systems reviewed and are negative.  EKGs/Labs/Other Studies Reviewed  Today:     TTE 11/30/21  EKG:  Last EKG results: Today - sinus rhythm, RBBB EF 65-70%. Normal left atrium  Recent Labs: 04/11/2022: Pro B Natriuretic peptide (BNP) 12.0 04/18/2022: ALT 27; B Natriuretic Peptide 10.7 04/19/2022: BUN 15; Creatinine, Ser 0.95; Hemoglobin 14.6; Platelets 265; Potassium 4.2; Sodium 137     Physical Exam:    VS:  BP 120/76   Pulse 62   Ht '6\' 1"'$  (1.854 m)   Wt 283 lb 6.4 oz (128.5 kg)   SpO2 93%   BMI 37.39 kg/m     Wt Readings from Last 3 Encounters:  05/09/22 283 lb 6.4 oz (128.5 kg)  05/08/22 282 lb 12.8 oz (128.3 kg)  05/01/22 284 lb 3.2 oz (128.9 kg)     GEN:  Well nourished, well developed in no acute distress CARDIAC: RRR, no murmurs, rubs, gallops RESPIRATORY:  Normal work of breathing MUSCULOSKELETAL: no edema    ASSESSMENT & PLAN:    Atrial fibrillation: having recurrence despite being on flecainide. I recommended re-do ablation to DC flecainide and better manage AF. We discussed the indication, rationale, logistics, anticipated benefits, and potential risks of the ablation procedure including but not limited to -- bleed at the groin access site, chest pain, damage to nearby organs such as the  diaphragm, lungs, or esophagus, need for a drainage tube, or prolonged hospitalization. I explained that the risk for stroke, heart attack, need for open chest surgery, or even death is very low but not zero. he  expressed understanding and would, at this point, work on weight loss and getting CPAP. He will let us know if he changes his mind. High risk medication: on flecainide. Continue diltiazem. With RBBB, would be good to reduce if not DC flecainide Suspected OSA: being evaluated for CPAP Obesity: we discussed the importance of weight loss for maintaining sinus rhythm.        Medication Adjustments/Labs and Tests Ordered: Current medicines are reviewed at length with the patient today.  Concerns regarding medicines are outlined above.  Orders Placed This Encounter  Procedures   EKG 12-Lead   No orders of the defined types were placed in this encounter.    Signed, Melida Quitter, MD  05/09/2022 10:16 AM    [place]

## 2022-05-09 NOTE — Patient Instructions (Addendum)
Medication Instructions:  Your physician recommends that you continue on your current medications as directed. Please refer to the Current Medication list given to you today.  *If you need a refill on your cardiac medications before your next appointment, please call your pharmacy*  Follow-Up: At [facility], you and your health needs are our priority.  As part of our continuing mission to provide you with exceptional heart care, we have created designated Provider Care Teams.  These Care Teams include your primary Cardiologist (physician) and Advanced Practice Providers (APPs -  Physician Assistants and Nurse Practitioners) who all work together to provide you with the care you need, when you need it.  Your next appointment:   6 months  The format for your next appointment:   In Person  Provider:   You may see Melida Quitter, MD or one of the following Advanced Practice Providers on your designated Care Team:   Tommye Standard, [place] Legrand Como "[facility] [place], [place]

## 2022-05-13 ENCOUNTER — Other Ambulatory Visit: Payer: Self-pay | Admitting: Family Medicine

## 2022-05-13 DIAGNOSIS — R0600 Dyspnea, unspecified: Secondary | ICD-10-CM

## 2022-05-13 DIAGNOSIS — R062 Wheezing: Secondary | ICD-10-CM

## 2022-05-13 NOTE — Telephone Encounter (Signed)
Chart supports rx. Last OV: 04/30/2022

## 2022-05-16 ENCOUNTER — Other Ambulatory Visit: Payer: Self-pay | Admitting: Internal Medicine

## 2022-05-20 ENCOUNTER — Telehealth: Payer: Self-pay | Admitting: Family Medicine

## 2022-05-20 ENCOUNTER — Telehealth: Payer: Self-pay | Admitting: Internal Medicine

## 2022-05-20 DIAGNOSIS — Z0279 Encounter for issue of other medical certificate: Secondary | ICD-10-CM

## 2022-05-20 NOTE — Telephone Encounter (Signed)
PAPERWORK/FORMS received  Dropped off by: Grant Ruts Call back #: [phone] Individual made aware of 3-5 business day turn around (YES/NO): yes GREEN charge sheet completed and patient made aware of possible charge (YES/NO): yes Placed in provider folder at front desk. ~~~ route to CMA/provider Team  CLINICAL USE BELOW THIS LINE (use X to signify action taken)  ___ Form received and placed in providers office for signature. ___ Form completed and faxed to LOA Dept.  ___ Form completed & LVM to notify patient ready for pick up.  ___ Charge sheet and copy of form in front office folder for office supervisor.

## 2022-05-20 NOTE — Telephone Encounter (Signed)
PAPERWORK/FORMS received  Dropped off by: Grant Ruts Call back #: [phone] Individual made aware of 3-5 business day turn around (YES/NO): yes GREEN charge sheet completed and patient made aware of possible charge (YES/NO): yes Placed in provider folder at front desk. ~~~ route to CMA/provider Team   CLINICAL USE BELOW THIS LINE (use X to signify action taken)   __X_ Form received and placed in providers office for signature. ___ Form completed and faxed to LOA Dept.  ___ Form completed & LVM to notify patient ready for pick up.  ___ Charge sheet and copy of form in front office folder for office supervisor.

## 2022-05-20 NOTE — Telephone Encounter (Signed)
Forms and payment received, Forms will be in providers box.   Thank you

## 2022-05-20 NOTE — Telephone Encounter (Signed)
PAPERWORK/FORMS received  Dropped off by: Grant Ruts Call back #: [phone] Individual made aware of 3-5 business day turn around (YES/NO): yes GREEN charge sheet completed and patient made aware of possible charge (YES/NO): yes Placed in provider folder at front desk. ~~~ route to CMA/provider Team   CLINICAL USE BELOW THIS LINE (use X to signify action taken)   ___ Form received and placed in providers office for signature. _X__ Form completed and faxed to LOA Dept.  __X_ Form completed & LVM to notify patient ready for pick up.  _X__ Charge sheet and copy of form in front office folder for office supervisor.

## 2022-05-21 ENCOUNTER — Telehealth: Payer: Self-pay | Admitting: Internal Medicine

## 2022-05-21 ENCOUNTER — Encounter: Payer: Self-pay | Admitting: Internal Medicine

## 2022-05-21 NOTE — Telephone Encounter (Signed)
Cough and upper respiratory tract infections are not common side effects of Zetia. Upper URI is reported with Repatha but incidence is still low. Ok to stop med for a week or two to see if symptoms improve, but ideally he should resume at that time.

## 2022-05-21 NOTE — Telephone Encounter (Signed)
Patient stated he started zetia in May, and he has been battling upper respiratory issues for a while. Patient stated his cough was really bad on Sunday and has not gotten really any better, so he thought it might be due to his zetia and repatha. Patient stated he read that one of the side effects of zetia is that it can cause upper respiratory infections. Encouraged patient to hold his zetia for now and see if that helps. Will forward to PharmD for advisement.

## 2022-05-21 NOTE — Telephone Encounter (Signed)
Pt c/o medication issue:  1. Name of Medication: Ezetimibe  2. How are you currently taking this medication (dosage and times per day)? 1 time a day  3. Are you having a reaction (difficulty breathing--STAT)? Yes- oxygen level been lower than normal  4. What is your medication issue? Terrible cough also had upper respiratory infection

## 2022-05-21 NOTE — Telephone Encounter (Signed)
Left message for patient to call back  

## 2022-05-22 ENCOUNTER — Other Ambulatory Visit: Payer: Self-pay | Admitting: Internal Medicine

## 2022-05-22 DIAGNOSIS — I480 Paroxysmal atrial fibrillation: Secondary | ICD-10-CM

## 2022-05-22 NOTE — Telephone Encounter (Signed)
Prescription refill request for Eliquis received. Indication: PAF Last office visit: 05/09/22  A Mealor MD Scr: 0.95 on 02/08/22 Age: 61 Weight: 128.5kg  Based on above findings Eliquis '5mg'$  twice daily is the appropriate dose.  Refill approved.

## 2022-05-29 NOTE — Telephone Encounter (Signed)
Disability paperwork filled out by MD and placed at the front desk.

## 2022-05-31 NOTE — Telephone Encounter (Signed)
Completed form faxed to [facility]. Patient and billing notified.

## 2022-06-05 ENCOUNTER — Other Ambulatory Visit: Payer: Self-pay | Admitting: Family Medicine

## 2022-06-05 DIAGNOSIS — R062 Wheezing: Secondary | ICD-10-CM

## 2022-06-05 DIAGNOSIS — R002 Palpitations: Secondary | ICD-10-CM

## 2022-06-05 DIAGNOSIS — R0609 Other forms of dyspnea: Secondary | ICD-10-CM

## 2022-06-05 DIAGNOSIS — R0600 Dyspnea, unspecified: Secondary | ICD-10-CM

## 2022-06-05 NOTE — Telephone Encounter (Signed)
Chart supports rx. Last OV: [id number]

## 2022-06-20 NOTE — Telephone Encounter (Signed)
Called patient to follow-up on call. Patient stated someone had talked to him about restarting zetia. Patient stated he has started back on zetia a few weeks ago.

## 2022-07-02 ENCOUNTER — Other Ambulatory Visit: Payer: Self-pay | Admitting: Family Medicine

## 2022-07-02 DIAGNOSIS — R0609 Other forms of dyspnea: Secondary | ICD-10-CM

## 2022-07-02 DIAGNOSIS — R002 Palpitations: Secondary | ICD-10-CM

## 2022-07-02 DIAGNOSIS — R062 Wheezing: Secondary | ICD-10-CM

## 2022-07-02 DIAGNOSIS — R0600 Dyspnea, unspecified: Secondary | ICD-10-CM

## 2022-07-02 NOTE — Telephone Encounter (Signed)
Chart supports rx. Last OV: 04/30/2022

## 2022-07-15 ENCOUNTER — Ambulatory Visit: Payer: Commercial Managed Care - HMO | Attending: Cardiovascular Disease

## 2022-07-15 DIAGNOSIS — I6350 Cerebral infarction due to unspecified occlusion or stenosis of unspecified cerebral artery: Secondary | ICD-10-CM

## 2022-07-15 DIAGNOSIS — E785 Hyperlipidemia, unspecified: Secondary | ICD-10-CM

## 2022-07-16 LAB — LIPID PANEL
Chol/HDL Ratio: 2.6 ratio (ref 0.0–5.0)
Cholesterol, Total: 87 mg/dL — ABNORMAL LOW (ref 100–199)
HDL: 33 mg/dL — ABNORMAL LOW (ref >39)
LDL Chol Calc (NIH): 25 mg/dL (ref 0–99)
Triglycerides: 182 mg/dL — ABNORMAL HIGH (ref 0–149)
VLDL Cholesterol Cal: 29 mg/dL (ref 5–40)

## 2022-07-16 LAB — APOLIPOPROTEIN B: Apolipoprotein B: 39 mg/dL (ref <90)

## 2022-07-17 ENCOUNTER — Telehealth: Payer: Self-pay | Admitting: Pharmacist

## 2022-07-17 NOTE — Telephone Encounter (Signed)
Called patient to review lipid panel.  LDL-C and ApoB are at goal.  Triglycerides are slightly elevated but much better than usual.  Med list has Repatha and Zetia we will want to confirm with patient that he is taking both of these as there was a time where he took a holiday.  As far as triglycerides patient should continue to work on exercise and limiting refined carbohydrates.  I left voicemail for patient to call back to discuss.

## 2022-07-17 NOTE — Telephone Encounter (Signed)
Spoke with patient and reviewed his labs with info below. He is taking both repatha and zetia. Cannot afford the Repatha right now because he hasn't met his deductible. Advised that he get a copay card to [facility]. Will only take off $200 after the first 3 months, but will help some. He doesn't have disability pay right now. Needs Dr. Gasper Sells to put a return to work date on the letter (even if its 2099).

## 2022-07-22 ENCOUNTER — Encounter: Payer: Self-pay | Admitting: Family Medicine

## 2022-07-22 ENCOUNTER — Telehealth: Payer: Self-pay | Admitting: Internal Medicine

## 2022-07-22 ENCOUNTER — Ambulatory Visit: Payer: Commercial Managed Care - HMO | Admitting: Family Medicine

## 2022-07-22 VITALS — BP 134/82 | HR 72 | Temp 97.5°F | Wt 281.2 lb

## 2022-07-22 DIAGNOSIS — M25561 Pain in right knee: Secondary | ICD-10-CM

## 2022-07-22 DIAGNOSIS — G8929 Other chronic pain: Secondary | ICD-10-CM | POA: Diagnosis not present

## 2022-07-22 MED ORDER — TRAMADOL HCL 50 MG PO TABS: 50.0000 mg | ORAL_TABLET | Freq: Three times a day (TID) | ORAL | 0 refills | Status: AC | PRN

## 2022-07-22 NOTE — Patient Instructions (Signed)
For knee pain, we have prescribed tramadol.  We have put a referral into [facility] for knee pain.  Please also call the office to see if they can get you in more quickly.  We have ordered an ultrasound to evaluate for possible DVT.  Office will call you for this.  If he develops chest pain shortness of breath or any other worrisome symptom please go to the ED

## 2022-07-22 NOTE — Progress Notes (Signed)
Assessment/Plan:   Problem List Items Addressed This Visit       Other   Chronic pain of right knee - Primary    Differential diagnosis:  1. Exacerbation of osteoarthritis - Given the chronic nature of the knee pain and the known history of arthritis, this is the most likely cause of the current symptoms. 2. Meniscal injury - The patient reports pain similar to the previous meniscal tear, indicating there may be a new or worsening meniscal issue. 3. Ligamentous injury - Instability and giving way of the knee suggest potential damage to the supporting ligaments, possibly the ACL, PCL, MCL, or LCL.  Plan:  Given the severity of pain and functional limitations, an orthopedic referral is needed for further evaluation and management. An X-ray may be redundant as the orthopedic specialists might prefer their own imaging studies, including an MRI to understand the internal structure of the knee more thoroughly.  1. Prescribe Tramadol for pain management considering the past tolerance and need for non-NSAID pain control due to anticoagulation therapy. 2. Order an ultrasound of the lower extremity to rule out deep vein thrombosis (DVT), especially given the calf pain, which can sometimes be indicative of a DVT and anticoagulation therapy. 3. Strongly recommend follow-up with orthopedics, preferably with the provider previously seen for continuity of care at [facility].      Relevant Medications   traMADol (ULTRAM) 50 MG tablet   Other Relevant Orders   VAS [place] LOWER EXTREMITY VENOUS (DVT)   Ambulatory referral to Orthopedics    There are no discontinued medications.    Subjective:  HPI: Encounter date: 07/22/2022  Ethan Lambert is a 62 y.o. male who has HIV infection ([place]); Essential hypertension; Atrial fibrillation ([place]); Cerebral artery occlusion with cerebral infarction ([place]); VARICOCELE; HEMATOSPERMIA; INGUINAL HERNIORRHAPHIES, BILATERAL, HX OF; S/P right knee arthroscopy;  Fatigue; HNP (herniated nucleus pulposus), lumbar; Encounter for long-term (current) use of medications; Shoulder pain, right; Acromioclavicular joint arthritis; Sprain of shoulder, right; Medication monitoring encounter; Subacromial bursitis of left shoulder joint; Paroxysmal atrial fibrillation ([place]); Rotator cuff syndrome of left shoulder; Acute medial meniscus tear, left, subsequent encounter; Morbid obesity ([place]); Bronchitis; Hyperlipidemia; Aortic atherosclerosis ([place]); Encounter for screening for infections with predominantly sexual mode of transmission; Nonrheumatic aortic valve insufficiency; Insomnia; Paroxysmal atrial fibrillation with RVR ([place]); Atrial fibrillation with RVR ([place]); Asthma; Loud snoring; and Chronic pain of right knee on their problem list..   He  has a past medical history of Anemia, Arthritis, Childhood asthma, Clotting disorder ([place]), CVA (cerebral vascular accident) ([place]) (1999 X 2), Family history of malignant neoplasm of gastrointestinal tract, Heart murmur, History of blood transfusion (1999), History of kidney stones, HIV disease ([place]) (dx 1999), Hypertension, Paroxysmal atrial fibrillation ([place]), Right knee meniscal tear, Seizures ([place]), Skin cancer, and Sleep apnea..  CHIEF COMPLAINT:  The patient presents with severe bilateral knee pain, particularly in the right knee, with radiation down to the calf and foot.  HISTORY OF PRESENT ILLNESS:  Problem 1: The patient has a history of chronic knee pain diagnosed as arthritis but reports an exacerbation over the past week. The pain is constant, with sharp excruciating spikes causing instability and potential falls. The patient mentions a recent fall due to the pain but denies any direct trauma or injury at that time.  Patient expressed concern that the weather (rain) was possibly influencing the pain severity. The patient also has a history of meniscal repairs on both knees, with the right knee operated on in 2014.  The patient  experiences a recurrence of pain below the kneecap, similar to what was felt during the initial meniscal injury.  The patient is taking Tylenol and ibuprofen and has used Voltaren gel for pain management. However, ibuprofen is limited due to concurrent anticoagulation therapy with Eliquis.  REVIEW OF SYSTEMS: The patient denies any chest pain, shortness of breath, or other systemic symptoms.   Past Surgical History:  Procedure Laterality Date   APPENDECTOMY  06-09-2003   ATRIAL FIBRILLATION ABLATION  05/28/2018   ATRIAL FIBRILLATION ABLATION N/A 05/28/2018   Procedure: ATRIAL FIBRILLATION ABLATION;  Surgeon: Rockford Leinen Grayer, MD;  Location: [facility];  Service: Cardiovascular;  Laterality: N/A;   BACK SURGERY     INGUINAL HERNIA REPAIR Bilateral 1990s   KNEE ARTHROSCOPY WITH MEDIAL MENISECTOMY Right 12/31/2013   Procedure: RIGHT KNEE ARTHROSCOPY PARTIAL MEDIAL MENISCECTOMY DEBRIDEMENT AND CHONDROPLASTY ;  Surgeon: Sydnee Cabal, MD;  Location: [facility];  Service: Orthopedics;  Laterality: Right;   KNEE ARTHROSCOPY WITH MEDIAL MENISECTOMY Left 01/27/2019   Procedure: LEFT KNEE ARTHROSCOPY WITH PARTIAL MEDIAL MENISCECTOMY;  Surgeon: Leandrew Koyanagi, MD;  Location: [facility];  Service: Orthopedics;  Laterality: Left;   LAPAROSCOPIC CHOLECYSTECTOMY  07-08-2003   LASER ABLATION ANAL CONDYLOMA  05-18-2001   LUMBAR LAMINECTOMY/DECOMPRESSION MICRODISCECTOMY Left 03/15/2015   Procedure: Left Lumbar five- sacral one microdiskectomy;  Surgeon: Consuella Lose, MD;  Location: [facility];  Service: Neurosurgery;  Laterality: Left;  Left L5S1 microdiskectomy   NEGATIVE SLEEP STUDY  01-03-2012  in epic   SKIN CANCER EXCISION Right    "cut it off my shoulder"   TRANSTHORACIC ECHOCARDIOGRAM  12-01-2008   MODERATE LVH/  EF 55-60%/  MILD MR/  NEGATIVE BUBBLE STUDY    Outpatient Medications Prior to Visit  Medication Sig Dispense Refill    abacavir-dolutegravir-lamiVUDine (TRIUMEQ) 600-50-300 MG tablet Take 1 tablet by mouth daily. 30 tablet 11   albuterol (VENTOLIN HFA) 108 (90 Base) MCG/ACT inhaler TAKE 2 PUFFS BY MOUTH EVERY 6 HOURS AS NEEDED FOR WHEEZE OR SHORTNESS OF BREATH 8.5 each 0   benzonatate (TESSALON) 200 MG capsule Take 1 capsule (200 mg total) by mouth 3 (three) times daily as needed for cough. 45 capsule 1   diltiazem (CARDIZEM CD) 240 MG 24 hr capsule TAKE 1 CAPSULE BY MOUTH EVERY DAY 90 capsule 3   diltiazem (CARDIZEM) 30 MG tablet Take 1 tablet every 4 hours AS NEEDED for afib heart rate >100 30 tablet 1   doxycycline (VIBRA-TABS) 100 MG tablet Take 1 tablet (100 mg total) by mouth 2 (two) times daily. 10 tablet 0   ELIQUIS 5 MG TABS tablet TAKE 1 TABLET BY MOUTH TWICE A DAY 60 tablet 5   Evolocumab (REPATHA SURECLICK) 462 MG/ML SOAJ Inject 140 mg into the skin every 14 (fourteen) days. 2 mL 11   ezetimibe (ZETIA) 10 MG tablet Take 1 tablet (10 mg total) by mouth daily. 90 tablet 3   flecainide (TAMBOCOR) 100 MG tablet Take 1 tablet (100 mg total) by mouth 2 (two) times daily. 180 tablet 3   fluticasone (FLONASE) 50 MCG/ACT nasal spray Place 2 sprays into both nostrils daily. (Patient taking differently: Place 2 sprays into both nostrils daily as needed for allergies (congestion).) 16 g 6   Fluticasone Furoate (ARNUITY ELLIPTA) 100 MCG/ACT AEPB Inhale 1 puff into the lungs daily. 30 each 1   ipratropium-albuterol (DUONEB) 0.5-2.5 (3) MG/3ML SOLN TAKE 3 MLS BY NEBULIZATION EVERY 4 (FOUR) HOURS AS NEEDED (  WHEEZING). 540 mL 0   No facility-administered medications prior to visit.    Family History  Problem Relation Age of Onset   Arrhythmia Father    Melanoma Father    Heart attack Brother    Prostate cancer Maternal Grandfather    Colon cancer Maternal Grandfather    Lung cancer Mother        smoker   Hypertension Mother    Hyperlipidemia Mother    Other Maternal Grandmother        harding of the arteries    Rectal cancer Neg Hx    Stomach cancer Neg Hx     Social History   Socioeconomic History   Marital status: Married    Spouse name: Not on file   Number of children: 1   Years of education: Not on file   Highest education level: Not on file  Occupational History   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   Social Determinants of Health   Financial Resource Strain: Not on file  Food Insecurity: No Food Insecurity (04/18/2022)   Hunger Vital Sign    Worried About Running Out of Food in the Last Year: Never true    [place] in the Last Year: Never true  Transportation Needs: No Transportation Needs (04/18/2022)   PRAPARE - Hydrologist (Medical): No    Lack of Transportation (Non-Medical): No  Physical Activity: Not on file  Stress: Not on file  Social Connections: Not on file  Intimate Partner Violence: Not At Risk (04/18/2022)   Humiliation, Afraid, Rape, and Kick questionnaire    Fear of Current or Ex-Partner: No    Emotionally Abused: No    Physically Abused: No    Sexually Abused: No                                                                                                 Objective:  Physical Exam: BP 134/82 (BP Location: Left Arm, Patient Position: Sitting, Cuff Size: Large)   Pulse 72   Temp (!) 97.5 F (36.4 C) (Temporal)   Wt 281 lb 3.2 oz (127.6 kg)   SpO2 96%   BMI 37.10 kg/m    Physical  Exam Constitutional:      Appearance: Normal appearance.  HENT:     Head: Normocephalic and atraumatic.  Musculoskeletal:     Right knee: Swelling present. No crepitus. Tenderness (lateral to the patella with mild swelling) present. No LCL laxity,  MCL laxity, ACL laxity or PCL laxity. Normal alignment, normal meniscus and normal patellar mobility. Normal pulse.     Instability Tests: Anterior drawer test negative. Posterior drawer test negative. Anterior Lachman test negative.     Left knee: Normal.     Right lower leg: Normal. No swelling. No edema.  Neurological:     Mental Status: He is alert.           Alesia Banda, MD, MS

## 2022-07-22 NOTE — Telephone Encounter (Signed)
MD filled out paperwork to include date to return to work.  Copies of all CV OV printed out and added to packet to be faxed to disability.

## 2022-07-22 NOTE — Telephone Encounter (Signed)
Patient brought a new [employer] form for Dr. Gasper Sells to indicate the return to work date on the form.  Also, the [employer] is requesting additional information/notes to be sent to them.  I gave the forms to [place] to pass on to [facility] and Dr. Gasper Sells.

## 2022-07-22 NOTE — Assessment & Plan Note (Signed)
Differential diagnosis:  1. Exacerbation of osteoarthritis - Given the chronic nature of the knee pain and the known history of arthritis, this is the most likely cause of the current symptoms. 2. Meniscal injury - The patient reports pain similar to the previous meniscal tear, indicating there may be a new or worsening meniscal issue. 3. Ligamentous injury - Instability and giving way of the knee suggest potential damage to the supporting ligaments, possibly the ACL, PCL, MCL, or LCL.  Plan:  Given the severity of pain and functional limitations, an orthopedic referral is needed for further evaluation and management. An X-ray may be redundant as the orthopedic specialists might prefer their own imaging studies, including an MRI to understand the internal structure of the knee more thoroughly.  1. Prescribe Tramadol for pain management considering the past tolerance and need for non-NSAID pain control due to anticoagulation therapy. 2. Order an ultrasound of the lower extremity to rule out deep vein thrombosis (DVT), especially given the calf pain, which can sometimes be indicative of a DVT and anticoagulation therapy. 3. Strongly recommend follow-up with orthopedics, preferably with the provider previously seen for continuity of care at [facility].

## 2022-07-24 NOTE — Telephone Encounter (Signed)
Paperwork given to front desk staff to follow up.

## 2022-08-01 ENCOUNTER — Encounter (HOSPITAL_COMMUNITY): Payer: Self-pay | Admitting: *Deleted

## 2022-08-16 ENCOUNTER — Ambulatory Visit: Payer: Commercial Managed Care - HMO | Admitting: Primary Care

## 2022-08-16 DIAGNOSIS — G4733 Obstructive sleep apnea (adult) (pediatric): Secondary | ICD-10-CM

## 2022-08-16 DIAGNOSIS — R0683 Snoring: Secondary | ICD-10-CM

## 2022-08-20 DIAGNOSIS — G4733 Obstructive sleep apnea (adult) (pediatric): Secondary | ICD-10-CM

## 2022-08-22 ENCOUNTER — Telehealth: Payer: Self-pay | Admitting: Pulmonary Disease

## 2022-08-22 ENCOUNTER — Other Ambulatory Visit: Payer: Self-pay | Admitting: *Deleted

## 2022-08-22 DIAGNOSIS — R0683 Snoring: Secondary | ICD-10-CM

## 2022-08-22 DIAGNOSIS — R400 Somnolence: Secondary | ICD-10-CM

## 2022-08-22 DIAGNOSIS — G4733 Obstructive sleep apnea (adult) (pediatric): Secondary | ICD-10-CM

## 2022-08-22 NOTE — Telephone Encounter (Signed)
[employer] calling to see if we rec'd a fax from them sent on the 21st  Debbie @ [phone] Fax was req office notes.

## 2022-08-22 NOTE — Progress Notes (Signed)
Please let patient know his home sleep study 08/17/22 showed very mild sleep apnea. He had average 5.2 apneas an hour. He did have mild-moderate oxygen desaturations. Due to that and his symptoms of loud snoring and daytime sleepiness with elevated Epworth score would consider starting him on CPAP if he is open to it. Other treatment options are oral appliance for snoring or optimize side sleeping position or elevate head 30 degrees. He has an apt with Dr. Lake Bells on 08/28/22, he does not treat sleep but can review study results and if patient would like to pursue CPAP we can start him on auto settings 5-15cm h20

## 2022-08-23 ENCOUNTER — Ambulatory Visit: Payer: Commercial Managed Care - HMO | Admitting: Pulmonary Disease

## 2022-08-23 ENCOUNTER — Telehealth: Payer: Self-pay | Admitting: Pulmonary Disease

## 2022-08-23 NOTE — Telephone Encounter (Signed)
We received a request from [facility] for office notes and test results for the period 06/24/2021 to 08/21/2022.  I have faxed office notes, sleep study and lab results to [facility] fax# [phone].  There was no disability form to be completed but cover letter did request to know if patient is able to go back to work with restrictions (hourly or physical).   I called the patient and he has the Physician's Statement form and will bring it to the office on Monday, 3/4.   He has an appointment with Dr. Lake Bells on 3/6 and they can discuss his disability, and ability to work during that appointment.   Per the patient, he has not worked since Nov 2021.  He is able to do ADL's, but not able to stoop, bend, or walk very much because of problems with his leg.  He is seeing an ortho for this problem.

## 2022-08-23 NOTE — Telephone Encounter (Signed)
I checked up front and did not see any paperwork for this patient. Called Debbie but she did not answer. Left message for her to call back.

## 2022-08-23 NOTE — Telephone Encounter (Signed)
Called pt to ensure disability paperwork was sent appropriately.  Pt reports he has no further needs at this time as far as he knows everything is fine.  I will close this encounter.

## 2022-08-26 ENCOUNTER — Telehealth: Payer: Self-pay

## 2022-08-26 NOTE — Telephone Encounter (Signed)
PT came in to drop off ppwk for Darilyn. Put in her box. PT dcln copy.

## 2022-08-27 NOTE — Telephone Encounter (Signed)
Completed Attending 42 Statement and gave it to Dr. Lake Bells to go over with the patient during his 3/6 appointment.  Need to complete return to work date and any work restrictions.

## 2022-08-28 ENCOUNTER — Ambulatory Visit: Payer: Commercial Managed Care - HMO | Admitting: Pulmonary Disease

## 2022-08-28 NOTE — Telephone Encounter (Signed)
Patient's appointment was moved to 3/15 with Dr. Silas Flood.  I've updated the form so that Dr. Silas Flood can review it with the patient on 3/15 and sign.

## 2022-09-02 DIAGNOSIS — Z0289 Encounter for other administrative examinations: Secondary | ICD-10-CM

## 2022-09-04 ENCOUNTER — Ambulatory Visit: Payer: Commercial Managed Care - HMO | Admitting: Pulmonary Disease

## 2022-09-06 ENCOUNTER — Ambulatory Visit: Payer: Commercial Managed Care - HMO | Admitting: Pulmonary Disease

## 2022-09-06 ENCOUNTER — Encounter: Payer: Self-pay | Admitting: Pulmonary Disease

## 2022-09-06 VITALS — BP 136/74 | HR 63 | Ht 71.0 in | Wt 287.4 lb

## 2022-09-06 DIAGNOSIS — G4733 Obstructive sleep apnea (adult) (pediatric): Secondary | ICD-10-CM | POA: Diagnosis not present

## 2022-09-06 DIAGNOSIS — J4530 Mild persistent asthma, uncomplicated: Secondary | ICD-10-CM | POA: Diagnosis not present

## 2022-09-06 MED ORDER — ARNUITY ELLIPTA 100 MCG/ACT IN AEPB: 1.0000 | INHALATION_SPRAY | Freq: Every day | RESPIRATORY_TRACT | 6 refills | Status: DC

## 2022-09-06 NOTE — Patient Instructions (Signed)
Nice to meet you  I did refill the Arnuity medication, I sent to the local pharmacy.  If it is better to send it to the mail pharmacy let me know and I can resend it.  With the nasal congestion, consider using Flonase twice a day for 1 week then back off to using once weekly.  Otherwise, no changes to medications.  I will fill out the paperwork   Return to clinic in 6 months or sooner as needed with Dr. Silas Flood

## 2022-09-06 NOTE — Progress Notes (Signed)
@Patient  ID: Ethan Lambert, male    DOB: 05/17/1961, 62 y.o.   MRN: [id number]  Chief Complaint  Patient presents with   New Patient (Initial Visit)    Pt is here for new patient appointment for asthma. Pt had HST in Feb and got the results. Pt is doing well with Ventolin, Arnuity and Duoneb treatments. Pt states all he has going on right now for the past week is his sinuses.     Referring provider: Bonnita Hollow, MD  HPI:   62 y.o. man whom I am seeing for evaluation of asthma and prolonged bronchitis.  Please establish care with Ethan Lambert in clinic.  Most recent pulmonary note from Ethan Lambert reviewed.  Most recent pulmonary note from Ethan Barrow, NP reviewed.  Most recent PCP note reviewed.  Discharge summary fall 2023 reviewed.  Prolonged bronchitis, brief hospital stay 04/2022.  Ongoing cough etc. for months afterwards.  Has been followed in pulmonary clinic.  Currently Arnuity.  He feels like his breathing is slowly improving.  Much better than prior.  He feels like Arnuity does help.  Not using albuterol as frequently.  Has some more sinus congestion over the last couple weeks with the pollens, change in weather etc.  Overall, it seems like over the last several months, breathing is steadily improved.  Maybe not totally back to baseline but better, significant so than prior.  He has a lot of muscles multiple issues.  This is his main limitation in terms of activity etc.  Questionaires / Pulmonary Flowsheets:   ACT:      No data to display          MMRC:     No data to display          Epworth:     05/08/2022    8:50 AM  Results of the Epworth flowsheet  Sitting and reading 3  Watching TV 3  Sitting, inactive in a public place (e.g. a theatre or a meeting) 3  As a passenger in a car for an hour without a break 3  Lying down to rest in the afternoon when circumstances permit 3  Sitting and talking to someone 1  Sitting quietly after a lunch without  alcohol 3  In a car, while stopped for a few minutes in traffic 0  Total score 19    Tests:   FENO:  No results found for: "NITRICOXIDE"  PFT:     No data to display          WALK:      No data to display          Imaging: Personally reviewed and as per EMR discussion this note No results found.  Lab Results: Personally reviewed CBC    Component Value Date/Time   WBC 9.4 04/19/2022 0203   RBC 4.70 04/19/2022 0203   HGB 14.6 04/19/2022 0203   HGB 15.5 10/25/2019 0940   HCT 43.2 04/19/2022 0203   HCT 44.5 10/25/2019 0940   PLT 265 04/19/2022 0203   PLT 261 10/25/2019 0940   MCV 91.9 04/19/2022 0203   MCV 90 10/25/2019 0940   MCH 31.1 04/19/2022 0203   MCHC 33.8 04/19/2022 0203   RDW 13.4 04/19/2022 0203   RDW 13.1 10/25/2019 0940   LYMPHSABS 2.2 04/18/2022 0859   LYMPHSABS 2.4 05/11/2018 1022   MONOABS 0.9 04/18/2022 0859   EOSABS 0.3 04/18/2022 0859   EOSABS 0.1 05/11/2018 1022   BASOSABS 0.1  04/18/2022 0859   BASOSABS 0.1 05/11/2018 1022    BMET    Component Value Date/Time   NA 137 04/19/2022 0203   NA 138 10/25/2019 0940   K 4.2 04/19/2022 0203   CL 102 04/19/2022 0203   CO2 25 04/19/2022 0203   GLUCOSE 166 (H) 04/19/2022 0203   BUN 15 04/19/2022 0203   BUN 17 10/25/2019 0940   CREATININE 0.95 04/19/2022 0203   CREATININE 0.89 10/31/2021 0853   CALCIUM 9.4 04/19/2022 0203   GFRNONAA >60 04/19/2022 0203   GFRNONAA 77 08/15/2020 0920   GFRAA 90 08/15/2020 0920    BNP    Component Value Date/Time   BNP 10.7 04/18/2022 0859    ProBNP    Component Value Date/Time   PROBNP 12.0 04/11/2022 1013    Specialty Problems       Pulmonary Problems   Bronchitis   Asthma   Loud snoring    - Patient has loud snoring. Epworth score 19. Needs home sleep study        Allergies  Allergen Reactions   Ms Contin [Morphine] Other (See Comments)    Hallucinations     Immunization History  Administered Date(s) Administered   H1N1  09/15/2008   Hepatitis B 08/05/2000, 03/25/2002, 09/30/2002   Influenza Split 04/07/2012, 03/14/2016   Influenza Whole 03/27/2006, 04/14/2008, 04/07/2009, 04/04/2010   Influenza, Seasonal, Injecte, Preservative Fre 04/08/2015   Influenza,inj,Quad PF,6+ Mos 05/12/2013, 04/06/2017, 05/12/2019, 04/12/2020, 04/19/2022   Janssen (J&J) SARS-COV-2 Vaccination 05/15/2020   PFIZER Comirnaty(Gray Top)Covid-19 Tri-Sucrose Vaccine 09/01/2020   Pneumococcal Conjugate-13 07/29/2019   Pneumococcal Polysaccharide-23 09/08/2001, 12/10/2007, 11/10/2012   Tdap 03/14/2016    Past Medical History:  Diagnosis Date   Anemia    Arthritis    "probably right ring finger joint" (05/28/2018)   Childhood asthma    as child none now   Clotting disorder ([place])    CVA (cerebral vascular accident) ([place]) 1999 X 2   LEFT FRONTAL & OCCIPITAL , NON-HEMORRHAGIC; no residual   Family history of malignant neoplasm of gastrointestinal tract    Heart murmur    History of blood transfusion 1999   "related to the stroke"   History of kidney stones    HIV disease ([place]) dx 1999   MONITORED BY INFECTIOUS DISEASE -- DR Ethan Lambert   Hypertension    Paroxysmal atrial fibrillation ([place])    EP -- Dr. Myles Lambert   Right knee meniscal tear    Seizures ([place])    1999   Skin cancer    skin   Sleep apnea    "didn't follow thru w/mask" (05/28/2018)    Tobacco History: Social History   Tobacco Use  Smoking Status Never   Passive exposure: Never  Smokeless Tobacco Never   Counseling given: Not Answered   Continue to not smoke  Outpatient Encounter Medications as of 09/06/2022  Medication Sig   abacavir-dolutegravir-lamiVUDine (TRIUMEQ) 600-50-300 MG tablet Take 1 tablet by mouth daily.   albuterol (VENTOLIN HFA) 108 (90 Base) MCG/ACT inhaler TAKE 2 PUFFS BY MOUTH EVERY 6 HOURS AS NEEDED FOR WHEEZE OR SHORTNESS OF BREATH   benzonatate (TESSALON) 200 MG capsule Take 1 capsule (200 mg total) by mouth 3 (three) times daily as  needed for cough.   diltiazem (CARDIZEM CD) 240 MG 24 hr capsule TAKE 1 CAPSULE BY MOUTH EVERY DAY   diltiazem (CARDIZEM) 30 MG tablet Take 1 tablet every 4 hours AS NEEDED for afib heart rate >100   doxycycline (VIBRA-TABS) 100 MG  tablet Take 1 tablet (100 mg total) by mouth 2 (two) times daily.   ELIQUIS 5 MG TABS tablet TAKE 1 TABLET BY MOUTH TWICE A DAY   Evolocumab (REPATHA SURECLICK) [id number] MG/ML SOAJ Inject 140 mg into the skin every 14 (fourteen) days.   ezetimibe (ZETIA) 10 MG tablet Take 1 tablet (10 mg total) by mouth daily.   flecainide (TAMBOCOR) 100 MG tablet Take 1 tablet (100 mg total) by mouth 2 (two) times daily.   fluticasone (FLONASE) 50 MCG/ACT nasal spray Place 2 sprays into both nostrils daily. (Patient taking differently: Place 2 sprays into both nostrils daily as needed for allergies (congestion).)   [DISCONTINUED] Fluticasone Furoate (ARNUITY ELLIPTA) 100 MCG/ACT AEPB Inhale 1 puff into the lungs daily.   Fluticasone Furoate (ARNUITY ELLIPTA) 100 MCG/ACT AEPB Inhale 1 puff into the lungs daily.   ipratropium-albuterol (DUONEB) 0.5-2.5 (3) MG/3ML SOLN TAKE 3 MLS BY NEBULIZATION EVERY 4 (FOUR) HOURS AS NEEDED (WHEEZING).   No facility-administered encounter medications on file as of 09/06/2022.     Review of Systems  Review of Systems  No chest pain with exertion.  No orthopnea or PND.  Comprehensive review of systems otherwise negative. Physical Exam  BP 136/74 (BP Location: Left Arm, Patient Position: Sitting, Cuff Size: Normal)   Pulse 63   Ht 5\' 11"  (1.803 m)   Wt 287 lb 6.4 oz (130.4 kg)   SpO2 93%   BMI 40.08 kg/m   Wt Readings from Last 5 Encounters:  09/06/22 287 lb 6.4 oz (130.4 kg)  07/22/22 281 lb 3.2 oz (127.6 kg)  05/09/22 283 lb 6.4 oz (128.5 kg)  05/08/22 282 lb 12.8 oz (128.3 kg)  05/01/22 284 lb 3.2 oz (128.9 kg)    BMI Readings from Last 5 Encounters:  09/06/22 40.08 kg/m  07/22/22 37.10 kg/m  05/09/22 37.39 kg/m  05/08/22 37.31  kg/m  05/01/22 37.50 kg/m     Physical Exam General: Sitting in chair, no acute distress Eyes: EOMI, no icterus Neck: Supple, no JVP Pulmonary: Clear, no work of breathing Cardiovascular: Warm, no edema Abdomen: Thin, sounds present MSK: No synovitis, no joint effusion Neuro: Normal gait, no weakness Psych: Normal mood, full affect  Assessment & Plan:   Asthma: Clinical diagnosis with prolonged bronchitis over the winter 2020 23-20 24.  Gradually improved with time.  Arnuity with ongoing improvement.  Arnuity refilled today.  Obstructive sleep apnea: Diagnosed Home sleep test 08/22/2022, mild, AHI 5.2.  CPAP orders placed.  Instructed him to contact office to set up follow-up visit 31 to 90 days after initiation of CPAP therapy at home.   Return in about 6 months (around 03/09/2023).   Lanier Clam, MD 09/06/2022   This appointment required 40 minutes of patient care (this includes precharting, chart review, review of results, face-to-face care, etc.).

## 2022-09-12 NOTE — Telephone Encounter (Signed)
c 

## 2022-09-12 NOTE — Telephone Encounter (Signed)
Dr. Silas Flood reviewed the disability form with the patient during his 3/15 office visit.  Form was completed and I faxed to [facility] - fax# [phone].   Included a copy of 3/15 office notes.  Mailed a hard copy to patient.

## 2022-10-21 ENCOUNTER — Ambulatory Visit: Payer: Commercial Managed Care - HMO | Admitting: Pulmonary Disease

## 2022-10-28 ENCOUNTER — Other Ambulatory Visit: Payer: Self-pay | Admitting: Internal Medicine

## 2022-10-28 DIAGNOSIS — B20 Human immunodeficiency virus [HIV] disease: Secondary | ICD-10-CM

## 2022-10-31 ENCOUNTER — Encounter: Payer: Self-pay | Admitting: Pulmonary Disease

## 2022-10-31 ENCOUNTER — Ambulatory Visit: Payer: Commercial Managed Care - HMO | Admitting: Pulmonary Disease

## 2022-10-31 VITALS — BP 130/72 | HR 63 | Ht 71.0 in | Wt 290.4 lb

## 2022-10-31 DIAGNOSIS — G4733 Obstructive sleep apnea (adult) (pediatric): Secondary | ICD-10-CM | POA: Diagnosis not present

## 2022-10-31 DIAGNOSIS — J4520 Mild intermittent asthma, uncomplicated: Secondary | ICD-10-CM | POA: Diagnosis not present

## 2022-10-31 NOTE — Progress Notes (Signed)
@Patient  ID: Ethan Lambert, male    DOB: 08/31/1960, 62 y.o.   MRN: [id number]  Chief Complaint  Patient presents with   Follow-up    ACT 20 OSA on CPAP    Referring provider: Garnette Gunner, MD  HPI:   62 y.o. man whom I am seeing for evaluation of asthma and prolonged bronchitis as well as OSA on CPAP.  Overall doing well.  Continues Arnuity for asthma.  Symptoms appear well-controlled.  Has used albuterol occasionally and does seem to help with breakthrough symptoms.  Unfortunate, he called for refill recently and the co-pay is jumped up to about $150 monthly.  This is too much.  Discussed stepdown therapy given well-controlled symptoms over the last several months.  Okay to stop ICS, will need to resume if symptoms worsen or albuterol use increases.  Recently started on CPAP.  Had for about 2 months.  He reports good adherence, wearing for several hours at night.  Reports improvement in sleep quality.  Improvement in daytime symptoms.  He was encouraged to continue CPAP therapy.  HPI at initial visit: Prolonged bronchitis, brief hospital stay 04/2022.  Ongoing cough etc. for months afterwards.  Has been followed in pulmonary clinic.  Currently Arnuity.  He feels like his breathing is slowly improving.  Much better than prior.  He feels like Arnuity does help.  Not using albuterol as frequently.  Has some more sinus congestion over the last couple weeks with the pollens, change in weather etc.  Overall, it seems like over the last several months, breathing is steadily improved.  Maybe not totally back to baseline but better, significant so than prior.  He has a lot of muscles multiple issues.  This is his main limitation in terms of activity etc.  Questionaires / Pulmonary Flowsheets:   ACT:  Asthma Control Test ACT Total Score  10/31/2022  8:32 AM 20    MMRC:     No data to display          Epworth:     05/08/2022    8:50 AM  Results of the Epworth flowsheet   Sitting and reading 3  Watching TV 3  Sitting, inactive in a public place (e.g. a theatre or a meeting) 3  As a passenger in a car for an hour without a break 3  Lying down to rest in the afternoon when circumstances permit 3  Sitting and talking to someone 1  Sitting quietly after a lunch without alcohol 3  In a car, while stopped for a few minutes in traffic 0  Total score 19    Tests:   FENO:  No results found for: "NITRICOXIDE"  PFT:     No data to display          WALK:      No data to display          Imaging: Personally reviewed and as per EMR discussion this note No results found.  Lab Results: Personally reviewed CBC    Component Value Date/Time   WBC 9.4 04/19/2022 0203   RBC 4.70 04/19/2022 0203   HGB 14.6 04/19/2022 0203   HGB 15.5 10/25/2019 0940   HCT 43.2 04/19/2022 0203   HCT 44.5 10/25/2019 0940   PLT 265 04/19/2022 0203   PLT 261 10/25/2019 0940   MCV 91.9 04/19/2022 0203   MCV 90 10/25/2019 0940   MCH 31.1 04/19/2022 0203   MCHC 33.8 04/19/2022 0203   RDW 13.4  04/19/2022 0203   RDW 13.1 10/25/2019 0940   LYMPHSABS 2.2 04/18/2022 0859   LYMPHSABS 2.4 05/11/2018 1022   MONOABS 0.9 04/18/2022 0859   EOSABS 0.3 04/18/2022 0859   EOSABS 0.1 05/11/2018 1022   BASOSABS 0.1 04/18/2022 0859   BASOSABS 0.1 05/11/2018 1022    BMET    Component Value Date/Time   NA 137 04/19/2022 0203   NA 138 10/25/2019 0940   K 4.2 04/19/2022 0203   CL 102 04/19/2022 0203   CO2 25 04/19/2022 0203   GLUCOSE 166 (H) 04/19/2022 0203   BUN 15 04/19/2022 0203   BUN 17 10/25/2019 0940   CREATININE 0.95 04/19/2022 0203   CREATININE 0.89 10/31/2021 0853   CALCIUM 9.4 04/19/2022 0203   GFRNONAA >60 04/19/2022 0203   GFRNONAA 77 08/15/2020 0920   GFRAA 90 08/15/2020 0920    BNP    Component Value Date/Time   BNP 10.7 04/18/2022 0859    ProBNP    Component Value Date/Time   PROBNP 12.0 04/11/2022 1013    Specialty Problems        Pulmonary Problems   Bronchitis   Asthma   Loud snoring    - Patient has loud snoring. Epworth score 19. Needs home sleep study        Allergies  Allergen Reactions   Ms Contin [Morphine] Other (See Comments)    Hallucinations     Immunization History  Administered Date(s) Administered   H1N1 09/15/2008   Hepatitis B 08/05/2000, 03/25/2002, 09/30/2002   Influenza Split 04/07/2012, 03/14/2016   Influenza Whole 03/27/2006, 04/14/2008, 04/07/2009, 04/04/2010   Influenza, Seasonal, Injecte, Preservative Fre 04/08/2015   Influenza,inj,Quad PF,6+ Mos 05/12/2013, 04/06/2017, 05/12/2019, 04/12/2020, 04/19/2022   Janssen (J&J) SARS-COV-2 Vaccination 05/15/2020   PFIZER Comirnaty(Gray Top)Covid-19 Tri-Sucrose Vaccine 09/01/2020   Pneumococcal Conjugate-13 07/29/2019   Pneumococcal Polysaccharide-23 09/08/2001, 12/10/2007, 11/10/2012   Tdap 03/14/2016    Past Medical History:  Diagnosis Date   Anemia    Arthritis    "probably right ring finger joint" (05/28/2018)   Childhood asthma    as child none now   Clotting disorder ([facility])    CVA (cerebral vascular accident) ([facility]) 1999 X 2   LEFT FRONTAL & OCCIPITAL , NON-HEMORRHAGIC; no residual   Family history of malignant neoplasm of gastrointestinal tract    Heart murmur    History of blood transfusion 1999   "related to the stroke"   History of kidney stones    HIV disease ([facility]) dx 1999   MONITORED BY INFECTIOUS DISEASE -- DR Molly Maduro [facility]   Hypertension    Paroxysmal atrial fibrillation ([facility])    EP -- Dr. Nelly Laurence   Right knee meniscal tear    Seizures ([facility])    1999   Skin cancer    skin   Sleep apnea    "didn't follow thru w/mask" (05/28/2018)    Tobacco History: Social History   Tobacco Use  Smoking Status Never   Passive exposure: Never  Smokeless Tobacco Never   Counseling given: Not Answered   Continue to not smoke  Outpatient Encounter Medications as of 10/31/2022  Medication Sig    abacavir-dolutegravir-lamiVUDine (TRIUMEQ) 600-50-300 MG tablet TAKE 1 TABLET BY MOUTH EVERY DAY   albuterol (VENTOLIN HFA) 108 (90 Base) MCG/ACT inhaler TAKE 2 PUFFS BY MOUTH EVERY 6 HOURS AS NEEDED FOR WHEEZE OR SHORTNESS OF BREATH   benzonatate (TESSALON) 200 MG capsule Take 1 capsule (200 mg total) by mouth 3 (three) times daily as needed for cough.  diltiazem (CARDIZEM CD) 240 MG 24 hr capsule TAKE 1 CAPSULE BY MOUTH EVERY DAY   diltiazem (CARDIZEM) 30 MG tablet Take 1 tablet every 4 hours AS NEEDED for afib heart rate >100   doxycycline (VIBRA-TABS) 100 MG tablet Take 1 tablet (100 mg total) by mouth 2 (two) times daily.   ELIQUIS 5 MG TABS tablet TAKE 1 TABLET BY MOUTH TWICE A DAY   Evolocumab (REPATHA SURECLICK) 140 MG/ML SOAJ Inject 140 mg into the skin every 14 (fourteen) days.   ezetimibe (ZETIA) 10 MG tablet Take 1 tablet (10 mg total) by mouth daily.   flecainide (TAMBOCOR) 100 MG tablet Take 1 tablet (100 mg total) by mouth 2 (two) times daily.   fluticasone (FLONASE) 50 MCG/ACT nasal spray Place 2 sprays into both nostrils daily. (Patient taking differently: Place 2 sprays into both nostrils daily as needed for allergies (congestion).)   Fluticasone Furoate (ARNUITY ELLIPTA) 100 MCG/ACT AEPB Inhale 1 puff into the lungs daily.   ipratropium-albuterol (DUONEB) 0.5-2.5 (3) MG/3ML SOLN TAKE 3 MLS BY NEBULIZATION EVERY 4 (FOUR) HOURS AS NEEDED (WHEEZING).   No facility-administered encounter medications on file as of 10/31/2022.     Review of Systems  Review of Systems  N/a Physical Exam  BP 130/72 (BP Location: Left Arm, Patient Position: Sitting, Cuff Size: Large)   Pulse 63   Ht 5\' 11"  (1.803 m)   Wt 290 lb 6.4 oz (131.7 kg)   SpO2 96%   BMI 40.50 kg/m   Wt Readings from Last 5 Encounters:  10/31/22 290 lb 6.4 oz (131.7 kg)  09/06/22 287 lb 6.4 oz (130.4 kg)  07/22/22 281 lb 3.2 oz (127.6 kg)  05/09/22 283 lb 6.4 oz (128.5 kg)  05/08/22 282 lb 12.8 oz (128.3 kg)     BMI Readings from Last 5 Encounters:  10/31/22 40.50 kg/m  09/06/22 40.08 kg/m  07/22/22 37.10 kg/m  05/09/22 37.39 kg/m  05/08/22 37.31 kg/m     Physical Exam General: Sitting in chair, no acute distress Eyes: EOMI, no icterus Neck: Supple, no JVP Pulmonary: Clear, no work of breathing Cardiovascular: Warm, no edema Abdomen: Thin, sounds present MSK: No synovitis, no joint effusion Neuro: Normal gait, no weakness Psych: Normal mood, full affect  Assessment & Plan:   Asthma: Clinical diagnosis with prolonged bronchitis over the winter 2023-2024.  Gradually improved with time.  Arnuity with improvement.  Now with significant cost increase.  Stepdown and stop ICS.  Continue albuterol, rescue inhaler, as needed.  Obstructive sleep apnea on CPAP: Diagnosed Home sleep test 08/22/2022, mild, AHI 5.2.  He reports excellent adherence to CPAP over the last 2 months.  He reports significant improvement in sleep and daytime symptoms.  He is benefiting clinically from ongoing CPAP use not continue to encourage him to use this.   Return in about 6 months (around 05/03/2023).   Karren Burly, MD 10/31/2022

## 2022-10-31 NOTE — Patient Instructions (Signed)
Nice to see you again  I am glad the CPAP is helping  After your Arnuity prescription or current inhaler runs out, okay to stop this.  Continue albuterol as needed as you are.  If you find your asthma symptoms worsen or you are needing albuterol more than a couple times a week, please let me know and we will look for more cost effective solution for a inhaler with steroid and like the Arnuity.  Return to clinic in 6 months or sooner as needed with Dr. Judeth Horn

## 2022-11-05 ENCOUNTER — Ambulatory Visit: Payer: Commercial Managed Care - HMO | Attending: Cardiovascular Disease | Admitting: Cardiovascular Disease

## 2022-11-05 ENCOUNTER — Encounter: Payer: Self-pay | Admitting: Cardiovascular Disease

## 2022-11-05 VITALS — BP 126/78 | HR 71 | Ht 71.0 in | Wt 287.4 lb

## 2022-11-05 DIAGNOSIS — I480 Paroxysmal atrial fibrillation: Secondary | ICD-10-CM

## 2022-11-05 NOTE — Progress Notes (Signed)
Electrophysiology Office Note:    Date:  11/05/2022   ID:  Ethan Lambert, DOB 10-01-1960, MRN [id number]  PCP:  Garnette Gunner, MD    [facility] Providers Cardiologist:  Christell Constant, MD Electrophysiologist:  Maurice Small, MD     Referring MD: Garnette Gunner, MD   Chief complaint: atrial fibrillation  History of Present Illness:    Ethan Lambert is a 62 y.o. male with a hx of PAF s/p ablation, subsequently managed with flecainide as needed  He had an AF ablation by Dr. Johney Frame in 2019. He resumed flecainide 50 when he had recurrence. He has recently begun to have recurrences again, so flecainide was increased to 100mg  PO BID. This helped with the burden and intensity of episodes, but he continues to still have symptoms.  Since his last visit he was diagnosed with sleep apnea and has been using CPAP.  His sleep is much better.  He feels much better.  He was able to lose weight, but has since regained the weight and then some.  He does not have chest pain, syncope, pre-syncope.  Past Medical History:  Diagnosis Date   Anemia    Arthritis    "probably right ring finger joint" (05/28/2018)   Childhood asthma    as child none now   Clotting disorder (HCC)    CVA (cerebral vascular accident) (HCC) 1999 X 2   LEFT FRONTAL & OCCIPITAL , NON-HEMORRHAGIC; no residual   Family history of malignant neoplasm of gastrointestinal tract    Heart murmur    History of blood transfusion 1999   "related to the stroke"   History of kidney stones    HIV disease (HCC) dx 1999   MONITORED BY INFECTIOUS DISEASE -- DR Molly Maduro COMER   Hypertension    Paroxysmal atrial fibrillation (HCC)    EP -- Dr. Nelly Laurence   Right knee meniscal tear    Seizures (HCC)    1999   Skin cancer    skin   Sleep apnea    "didn't follow thru w/mask" (05/28/2018)    Past Surgical History:  Procedure Laterality Date   APPENDECTOMY  06-09-2003   ATRIAL FIBRILLATION ABLATION   05/28/2018   ATRIAL FIBRILLATION ABLATION N/A 05/28/2018   Procedure: ATRIAL FIBRILLATION ABLATION;  Surgeon: Hillis Range, MD;  Location: [facility];  Service: Cardiovascular;  Laterality: N/A;   BACK SURGERY     INGUINAL HERNIA REPAIR Bilateral 1990s   KNEE ARTHROSCOPY WITH MEDIAL MENISECTOMY Right 12/31/2013   Procedure: RIGHT KNEE ARTHROSCOPY PARTIAL MEDIAL MENISCECTOMY DEBRIDEMENT AND CHONDROPLASTY ;  Surgeon: Eugenia Mcalpine, MD;  Location: [facility];  Service: Orthopedics;  Laterality: Right;   KNEE ARTHROSCOPY WITH MEDIAL MENISECTOMY Left 01/27/2019   Procedure: LEFT KNEE ARTHROSCOPY WITH PARTIAL MEDIAL MENISCECTOMY;  Surgeon: Tarry Kos, MD;  Location: [facility];  Service: Orthopedics;  Laterality: Left;   LAPAROSCOPIC CHOLECYSTECTOMY  07-08-2003   LASER ABLATION ANAL CONDYLOMA  05-18-2001   LUMBAR LAMINECTOMY/DECOMPRESSION MICRODISCECTOMY Left 03/15/2015   Procedure: Left Lumbar five- sacral one microdiskectomy;  Surgeon: Lisbeth Renshaw, MD;  Location: [facility];  Service: Neurosurgery;  Laterality: Left;  Left L5S1 microdiskectomy   NEGATIVE SLEEP STUDY  01-03-2012  in epic   SKIN CANCER EXCISION Right    "cut it off my shoulder"   TRANSTHORACIC ECHOCARDIOGRAM  12-01-2008   MODERATE LVH/  EF 55-60%/  MILD MR/  NEGATIVE BUBBLE STUDY    Current Medications: Current  Meds  Medication Sig   abacavir-dolutegravir-lamiVUDine (TRIUMEQ) 600-50-300 MG tablet TAKE 1 TABLET BY MOUTH EVERY DAY   albuterol (VENTOLIN HFA) 108 (90 Base) MCG/ACT inhaler TAKE 2 PUFFS BY MOUTH EVERY 6 HOURS AS NEEDED FOR WHEEZE OR SHORTNESS OF BREATH   ATIVAN 1 MG tablet Take 1 tablet to 2 tablets 90 minutes prior to MRI   benzonatate (TESSALON) 200 MG capsule Take 1 capsule (200 mg total) by mouth 3 (three) times daily as needed for cough.   diltiazem (CARDIZEM CD) 240 MG 24 hr capsule TAKE 1 CAPSULE BY MOUTH EVERY DAY   diltiazem (CARDIZEM) 30 MG tablet Take 1 tablet  every 4 hours AS NEEDED for afib heart rate >100   doxycycline (VIBRA-TABS) 100 MG tablet Take 1 tablet (100 mg total) by mouth 2 (two) times daily.   ELIQUIS 5 MG TABS tablet TAKE 1 TABLET BY MOUTH TWICE A DAY   Evolocumab (REPATHA SURECLICK) 140 MG/ML SOAJ Inject 140 mg into the skin every 14 (fourteen) days.   ezetimibe (ZETIA) 10 MG tablet Take 1 tablet (10 mg total) by mouth daily.   flecainide (TAMBOCOR) 100 MG tablet Take 1 tablet (100 mg total) by mouth 2 (two) times daily.   fluticasone (FLONASE) 50 MCG/ACT nasal spray Place 2 sprays into both nostrils daily.   HYDROcodone-acetaminophen (NORCO/VICODIN) 5-325 MG tablet Take 1 tablet every 6 hours by oral route as needed for 5 days.   NEURONTIN 300 MG capsule Take 1 capsule as needed by oral route in the evening for 30 days.   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   Social Determinants of Health   Financial Resource Strain: Not on file  Food Insecurity: No Food Insecurity (04/18/2022)   Hunger Vital Sign     Worried About Running Out of Food in the Last Year: Never true    Ran Out of Food in the Last Year: Never true  Transportation Needs: No Transportation Needs (04/18/2022)   PRAPARE - Administrator, Civil Service (Medical): No    Lack of Transportation (Non-Medical): No  Physical Activity: Not on file  Stress: Not on file  Social Connections: Not on file     Family History: The patient's family history includes Arrhythmia in his father; Colon cancer in his maternal grandfather; Heart attack in his brother; Hyperlipidemia in his mother; Hypertension in his mother;  Lung cancer in his mother; Melanoma in his father; Other in his maternal grandmother; Prostate cancer in his maternal grandfather. There is no history of Rectal cancer or Stomach cancer.  ROS:   Please see the history of present illness.    All other systems reviewed and are negative.  EKGs/Labs/Other Studies Reviewed Today:     TTE 11/30/21  EKG:  Last EKG results: Today - sinus rhythm, RBBB EF 65-70%. Normal left atrium  Recent Labs: 04/11/2022: Pro B Natriuretic peptide (BNP) 12.0 04/18/2022: ALT 27; B Natriuretic Peptide 10.7 04/19/2022: BUN 15; Creatinine, Ser 0.95; Hemoglobin 14.6; Platelets 265; Potassium 4.2; Sodium 137     Physical Exam:    VS:  BP 126/78   Pulse 71   Ht 5\' 11"  (1.803 m)   Wt 287 lb 6.4 oz (130.4 kg)   SpO2 95%   BMI 40.08 kg/m     Wt Readings from Last 3 Encounters:  11/05/22 287 lb 6.4 oz (130.4 kg)  10/31/22 290 lb 6.4 oz (131.7 kg)  09/06/22 287 lb 6.4 oz (130.4 kg)     GEN:  Well nourished, well developed in no acute distress CARDIAC: RRR, no murmurs, rubs, gallops RESPIRATORY:  Normal work of breathing MUSCULOSKELETAL: no edema    ASSESSMENT & PLAN:    Atrial fibrillation: having recurrence despite being on flecainide.  He is doing better now that he is using CPAP.  I encouraged weight loss.  If he is able to lose a significant amount of weight, he will have a  good chance of remaining in normal rhythm with flecainide. High risk medication: on flecainide. Continue diltiazem. With RBBB, would be good to reduce if not DC flecainide. Will check BMET and Mg. Suspected OSA: now managed with CPAP Obesity: we discussed the importance of weight loss for maintaining sinus rhythm.        Medication Adjustments/Labs and Tests Ordered: Current medicines are reviewed at length with the patient today.  Concerns regarding medicines are outlined above.  No orders of the defined types were placed in this encounter.  No orders of the defined types were placed in this encounter.    Signed, Maurice Small, MD  11/05/2022 9:42 AM    [facility]

## 2022-11-05 NOTE — Patient Instructions (Addendum)
Medication Instructions:  Your physician recommends that you continue on your current medications as directed. Please refer to the Current Medication list given to you today. *If you need a refill on your cardiac medications before your next appointment, please call your pharmacy*   Lab Work: BMET and Mg TODAY If you have labs (blood work) drawn today and your tests are completely normal, you will receive your results only by: MyChart Message (if you have MyChart) OR A paper copy in the mail If you have any lab test that is abnormal or we need to change your treatment, we will call you to review the results.   Follow-Up: At [facility], you and your health needs are our priority.  As part of our continuing mission to provide you with exceptional heart care, we have created designated Provider Care Teams.  These Care Teams include your primary Cardiologist (physician) and Advanced Practice Providers (APPs -  Physician Assistants and Nurse Practitioners) who all work together to provide you with the care you need, when you need it.  We recommend signing up for the patient portal called "MyChart".  Sign up information is provided on this After Visit Summary.  MyChart is used to connect with patients for Virtual Visits (Telemedicine).  Patients are able to view lab/test results, encounter notes, upcoming appointments, etc.  Non-urgent messages can be sent to your provider as well.   To learn more about what you can do with MyChart, go to [email or web address].    Your next appointment:   6 month(s)  Provider:   York Pellant, MD

## 2022-11-06 LAB — BASIC METABOLIC PANEL
BUN/Creatinine Ratio: 15 (ref 10–24)
BUN: 15 mg/dL (ref 8–27)
CO2: 21 mmol/L (ref 20–29)
Calcium: 9.6 mg/dL (ref 8.6–10.2)
Chloride: 103 mmol/L (ref 96–106)
Creatinine, Ser: 1.03 mg/dL (ref 0.76–1.27)
Glucose: 171 mg/dL — ABNORMAL HIGH (ref 70–99)
Potassium: 4.2 mmol/L (ref 3.5–5.2)
Sodium: 141 mmol/L (ref 134–144)
eGFR: 83 mL/min/{1.73_m2} (ref >59)

## 2022-11-06 LAB — MAGNESIUM: Magnesium: 2 mg/dL (ref 1.6–2.3)

## 2022-11-08 ENCOUNTER — Other Ambulatory Visit: Payer: Self-pay | Admitting: Internal Medicine

## 2022-11-14 ENCOUNTER — Ambulatory Visit: Payer: Commercial Managed Care - HMO | Admitting: Internal Medicine

## 2022-11-14 ENCOUNTER — Other Ambulatory Visit (HOSPITAL_COMMUNITY)
Admission: RE | Admit: 2022-11-14 | Discharge: 2022-11-14 | Disposition: A | Discharge status: Home / Self Care | Payer: Commercial Managed Care - HMO | Source: Ambulatory Visit | Attending: Internal Medicine | Admitting: Internal Medicine

## 2022-11-14 ENCOUNTER — Encounter: Payer: Self-pay | Admitting: Internal Medicine

## 2022-11-14 ENCOUNTER — Other Ambulatory Visit: Payer: Self-pay

## 2022-11-14 VITALS — BP 133/78 | HR 60 | Temp 98.3°F | Resp 16 | Wt 285.0 lb

## 2022-11-14 DIAGNOSIS — Z113 Encounter for screening for infections with a predominantly sexual mode of transmission: Secondary | ICD-10-CM

## 2022-11-14 DIAGNOSIS — Z5181 Encounter for therapeutic drug level monitoring: Secondary | ICD-10-CM | POA: Diagnosis not present

## 2022-11-14 DIAGNOSIS — B20 Human immunodeficiency virus [HIV] disease: Secondary | ICD-10-CM | POA: Diagnosis not present

## 2022-11-14 MED ORDER — TRIUMEQ 600-50-300 MG PO TABS: 1.0000 | ORAL_TABLET | Freq: Every day | ORAL | 11 refills | Status: DC

## 2022-11-14 NOTE — Progress Notes (Signed)
   Subjective:    Patient ID: Ethan Lambert, male    DOB: January 30, 1961, 62 y.o.   MRN: [id number]  HPI Ethan Lambert is here for yearly follow up of HIV He continues on Triumeq and no issues with getting or taking his medication.  No missed doses.  His main issue is osteoarthritis and is seeing Dr. Jillyn Hidden of orthopedic surgery.  Continues to follow with his pcp, cardiology and pulmonary.  He is now on CPAP and feels better.  Daughter now 47 years old.     Review of Systems  Constitutional:  Negative for fatigue.  Gastrointestinal:  Negative for diarrhea and nausea.  Skin:  Negative for rash.       Objective:   Physical Exam Eyes:     General: No scleral icterus. Pulmonary:     Effort: Pulmonary effort is normal.  Neurological:     Mental Status: He is alert.    SH: no tobacco       Assessment & Plan:

## 2022-11-14 NOTE — Assessment & Plan Note (Addendum)
He continues to do well with no concerns.  Triumeq doing well.  No changes indicated.  Refills provided.    I have personally spent 31 minutes involved in face-to-face and non-face-to-face activities for this patient on the day of the visit. Professional time spent includes the following activities: Preparing to see the patient (review of tests), Obtaining and/or reviewing separately obtained history (admission/discharge record), Performing a medically appropriate examination and/or evaluation , Ordering medications/tests/procedures, referring and communicating with other health care professionals, Documenting clinical information in the EMR, Independently interpreting results (not separately reported), Communicating results to the patient/family/caregiver, Counseling and educating the patient/family/caregiver and Care coordination (not separately reported).

## 2022-11-14 NOTE — Assessment & Plan Note (Signed)
Will screen.  Low risk 

## 2022-11-15 LAB — T-HELPER CELL (CD4) - (RCID CLINIC ONLY)
CD4 % Helper T Cell: 37 % (ref 33–65)
CD4 T Cell Abs: 746 /uL (ref 400–1790)

## 2022-11-15 LAB — URINE CYTOLOGY ANCILLARY ONLY
Chlamydia: NEGATIVE
Comment: NEGATIVE
Comment: NORMAL
Neisseria Gonorrhea: NEGATIVE

## 2022-11-15 LAB — RPR: RPR Ser Ql: NONREACTIVE

## 2022-11-16 LAB — HIV-1 RNA QUANT-NO REFLEX-BLD
HIV 1 RNA Quant: NOT DETECTED Copies/mL
HIV-1 RNA Quant, Log: NOT DETECTED Log cps/mL

## 2022-12-23 ENCOUNTER — Ambulatory Visit: Payer: Commercial Managed Care - HMO | Admitting: Nurse Practitioner

## 2023-02-10 ENCOUNTER — Telehealth: Payer: Self-pay | Admitting: Internal Medicine

## 2023-02-10 ENCOUNTER — Other Ambulatory Visit: Payer: Self-pay

## 2023-02-10 DIAGNOSIS — I480 Paroxysmal atrial fibrillation: Secondary | ICD-10-CM

## 2023-02-10 MED ORDER — APIXABAN 5 MG PO TABS
5.0000 mg | ORAL_TABLET | Freq: Two times a day (BID) | ORAL | 5 refills | Status: DC
Start: 2023-02-10 — End: 2023-04-23

## 2023-02-10 NOTE — Telephone Encounter (Signed)
*  STAT* If patient is at the pharmacy, call can be transferred to refill team.   1. Which medications need to be refilled? (please list name of each medication and dose if known)   ELIQUIS 5 MG TABS tablet    2. Which pharmacy/location (including street and city if local pharmacy) is medication to be sent to?  [id number] - [place], [place] - [address]    3. Do they need a 30 day or 90 day supply? 60

## 2023-02-10 NOTE — Telephone Encounter (Signed)
Prescription refill request for Eliquis received. Indication:afib Last office visit:5/24 Scr:1.03  5/24 Age: 62 Weight:129.3  kg  Prescription refilled

## 2023-02-18 ENCOUNTER — Other Ambulatory Visit (HOSPITAL_COMMUNITY): Payer: Self-pay

## 2023-03-03 ENCOUNTER — Other Ambulatory Visit (HOSPITAL_COMMUNITY): Payer: Self-pay

## 2023-03-06 ENCOUNTER — Other Ambulatory Visit: Payer: Self-pay

## 2023-03-06 DIAGNOSIS — B20 Human immunodeficiency virus [HIV] disease: Secondary | ICD-10-CM

## 2023-03-06 MED ORDER — TRIUMEQ 600-50-300 MG PO TABS
1.0000 | ORAL_TABLET | Freq: Every day | ORAL | 11 refills | Status: DC
Start: 2023-03-06 — End: 2024-01-08

## 2023-03-12 ENCOUNTER — Encounter: Payer: Self-pay | Admitting: Internal Medicine

## 2023-03-28 ENCOUNTER — Other Ambulatory Visit: Payer: Self-pay

## 2023-03-28 ENCOUNTER — Other Ambulatory Visit: Payer: Self-pay | Admitting: Pharmacist

## 2023-03-28 MED ORDER — REPATHA SURECLICK 140 MG/ML ~~LOC~~ SOAJ: 1.0000 mL | SUBCUTANEOUS | 11 refills | Status: DC

## 2023-03-28 MED ORDER — FLECAINIDE ACETATE 100 MG PO TABS: 100.0000 mg | ORAL_TABLET | Freq: Two times a day (BID) | ORAL | 0 refills | Status: DC

## 2023-03-31 ENCOUNTER — Telehealth: Payer: Self-pay | Admitting: Pulmonary Disease

## 2023-03-31 DIAGNOSIS — G4733 Obstructive sleep apnea (adult) (pediatric): Secondary | ICD-10-CM

## 2023-03-31 NOTE — Telephone Encounter (Signed)
PT needs Cpap supplies. New insurance, Medicare/United Health Care:  New Mask Nose Pieces Filters Reservoir  Hose Line  Cpap company is [employer] Please PT call to advise @ [phone]

## 2023-04-01 NOTE — Telephone Encounter (Signed)
Lm x1 for patient.  Dr. Judeth Horn, please advise if okay to order cpap supplies? Thanks

## 2023-04-01 NOTE — Telephone Encounter (Signed)
Patient is returning phone call. Patient phone number is [phone].

## 2023-04-01 NOTE — Telephone Encounter (Signed)
Yes ok for cpap supplies

## 2023-04-02 NOTE — Telephone Encounter (Signed)
Patient states [employer] [facility] is covered by insurance. Patient phone number is [phone].

## 2023-04-02 NOTE — Telephone Encounter (Signed)
Patient states Ethan Lambert is not covered by insurance. Insurance gave patient 68 different DME companies covered by insurance. Patient phone number is [phone].

## 2023-04-04 NOTE — Telephone Encounter (Signed)
New order placed with DME showing as [employer].

## 2023-04-04 NOTE — Telephone Encounter (Signed)
Can we get a new order placed for [employer] due to insurance

## 2023-04-10 ENCOUNTER — Other Ambulatory Visit (HOSPITAL_COMMUNITY): Payer: Self-pay

## 2023-04-10 ENCOUNTER — Telehealth: Payer: Self-pay | Admitting: Pharmacy Technician

## 2023-04-10 NOTE — Telephone Encounter (Signed)
Pharmacy Patient Advocate Encounter   Received notification from CoverMyMeds that prior authorization for repatha is required/requested.   Insurance verification completed.   The patient is insured through [facility] .   Per test claim: PA required; PA submitted to [facility] via CoverMyMeds Key/confirmation #/EOC [id number] Status is pending

## 2023-04-10 NOTE — Telephone Encounter (Signed)
Pharmacy Patient Advocate Encounter  Received notification from [facility] that Prior Authorization for repatha has been APPROVED from 04/10/23 to 10/09/23   PA #/Case ID/Reference #: [id number]

## 2023-04-23 ENCOUNTER — Telehealth: Payer: Self-pay | Admitting: Internal Medicine

## 2023-04-23 DIAGNOSIS — I480 Paroxysmal atrial fibrillation: Secondary | ICD-10-CM

## 2023-04-23 MED ORDER — APIXABAN 5 MG PO TABS
5.0000 mg | ORAL_TABLET | Freq: Two times a day (BID) | ORAL | 1 refills | Status: DC
Start: 2023-04-23 — End: 2024-01-07

## 2023-04-23 NOTE — Telephone Encounter (Signed)
*  STAT* If patient is at the pharmacy, call can be transferred to refill team.   1. Which medications need to be refilled? (please list name of each medication and dose if known) apixaban (ELIQUIS) 5 MG TABS tablet    2. Would you like to learn more about the convenience, safety, & potential cost savings by using the [facility]? No     3. Are you open to using the [facility] (Type [facility]. No ).   4. Which pharmacy/location (including street and city if local pharmacy) is medication to be sent to? [id number] - [place], [place] - [address]    5. Do they need a 30 day or 90 day supply? 90

## 2023-04-23 NOTE — Telephone Encounter (Signed)
Prescription refill request for Eliquis received. Indication: Afib  Last office visit: 11/05/22 ([facility])  Scr: 1.03 (11/05/22)  Age: 62 Weight: 129.3kg  Appropriate dose. Refill sent.

## 2023-05-09 ENCOUNTER — Ambulatory Visit: Payer: Medicare Other | Attending: Cardiovascular Disease | Admitting: Cardiovascular Disease

## 2023-05-09 VITALS — BP 142/76 | HR 67 | Ht 69.0 in | Wt 288.0 lb

## 2023-05-09 DIAGNOSIS — I480 Paroxysmal atrial fibrillation: Secondary | ICD-10-CM

## 2023-05-09 NOTE — Patient Instructions (Signed)
Medication Instructions:  Your physician recommends that you continue on your current medications as directed. Please refer to the Current Medication list given to you today. *If you need a refill on your cardiac medications before your next appointment, please call your pharmacy*   Lab Work: BMET today If you have labs (blood work) drawn today and your tests are completely normal, you will receive your results only by: MyChart Message (if you have MyChart) OR A paper copy in the mail If you have any lab test that is abnormal or we need to change your treatment, we will call you to review the results.   Follow-Up: At [facility], you and your health needs are our priority.  As part of our continuing mission to provide you with exceptional heart care, we have created designated Provider Care Teams.  These Care Teams include your primary Cardiologist (physician) and Advanced Practice Providers (APPs -  Physician Assistants and Nurse Practitioners) who all work together to provide you with the care you need, when you need it.  We recommend signing up for the patient portal called "MyChart".  Sign up information is provided on this After Visit Summary.  MyChart is used to connect with patients for Virtual Visits (Telemedicine).  Patients are able to view lab/test results, encounter notes, upcoming appointments, etc.  Non-urgent messages can be sent to your provider as well.   To learn more about what you can do with MyChart, go to [email or web address].    Your next appointment:   6 month(s)  Provider:   York Pellant, MD

## 2023-05-09 NOTE — Progress Notes (Signed)
  Electrophysiology Office Note:    Date:  05/09/2023   ID:  SANDFORD BADGLEY, DOB [birth year], MRN [id number]  PCP:  Garnette Gunner, MD   [facility] Providers Cardiologist:  Christell Constant, MD Electrophysiologist:  Maurice Small, MD     Referring MD: Garnette Gunner, MD   Chief complaint: atrial fibrillation  History of Present Illness:    Ethan Lambert is a 62 y.o. male with a hx of PAF s/p ablation, subsequently managed with flecainide as needed  He had an AF ablation by Dr. Johney Frame in 2019. He resumed flecainide 50 when he had recurrence. He has recently begun to have recurrences again, so flecainide was increased to 100mg  PO BID. This helped with the burden and intensity of episodes, but he continues to still have symptoms.  Since his last visit he was diagnosed with sleep apnea and has been using CPAP.  His sleep is much better.  He feels much better.  He was able to lose weight, but has since regained the weight and then some.  He does not have chest pain, syncope, pre-syncope.      EKGs/Labs/Other Studies Reviewed Today:     TTE 11/30/21  EKG Interpretation Date/Time:  Friday May 09 2023 09:44:54 EST Ventricular Rate:  67 PR Interval:  218 QRS Duration:  128 QT Interval:  416 QTC Calculation: 439 R Axis:   270  Text Interpretation: Sinus rhythm with 1st degree A-V block Left axis deviation Right bundle branch block When compared with ECG of 18-Apr-2022 14:12, Sinus rhythm has replaced AF Confirmed by York Pellant [phone]) on 05/09/2023 9:52:07 AM    Recent Labs: 11/05/2022: BUN 15; Creatinine, Ser 1.03; Magnesium 2.0; Potassium 4.2; Sodium 141     Physical Exam:    VS:  BP (!) 142/76   Pulse 67   Ht 5\' 9"  (1.753 m)   Wt 288 lb (130.6 kg)   SpO2 97%   BMI 42.53 kg/m     Wt Readings from Last 3 Encounters:  05/09/23 288 lb (130.6 kg)  11/14/22 285 lb (129.3 kg)  11/05/22 287 lb 6.4 oz (130.4 kg)     GEN:  Well  nourished, well developed in no acute distress CARDIAC: RRR, no murmurs, rubs, gallops RESPIRATORY:  Normal work of breathing MUSCULOSKELETAL: no edema    ASSESSMENT & PLAN:    Atrial fibrillation:  having recurrence despite being on flecainide prior to starting CPAP.   doing better now that he is using CPAP.   I encouraged weight loss.   If he is able to lose a significant amount of weight, he will have a good chance of remaining in normal rhythm with flecainide.  High risk medication:  on flecainide. Continue diltiazem.  With RBBB, would be good to reduce if not DC flecainide.  Will check BMET and Mg.  OSA:  now managed with CPAP  Obesity:  we discussed the importance of weight loss for maintaining sinus rhythm.        Medication Adjustments/Labs and Tests Ordered: Current medicines are reviewed at length with the patient today.  Concerns regarding medicines are outlined above.  Orders Placed This Encounter  Procedures   EKG 12-Lead   No orders of the defined types were placed in this encounter.    Signed, Maurice Small, MD  05/09/2023 9:51 AM    [facility]

## 2023-05-10 LAB — BASIC METABOLIC PANEL
BUN/Creatinine Ratio: 16 (ref 10–24)
BUN: 15 mg/dL (ref 8–27)
CO2: 27 mmol/L (ref 20–29)
Calcium: 9.5 mg/dL (ref 8.6–10.2)
Chloride: 103 mmol/L (ref 96–106)
Creatinine, Ser: 0.94 mg/dL (ref 0.76–1.27)
Glucose: 123 mg/dL — ABNORMAL HIGH (ref 70–99)
Potassium: 4.2 mmol/L (ref 3.5–5.2)
Sodium: 140 mmol/L (ref 134–144)
eGFR: 92 mL/min/{1.73_m2} (ref >59)

## 2023-05-13 ENCOUNTER — Other Ambulatory Visit: Payer: Self-pay | Admitting: Internal Medicine

## 2023-06-02 ENCOUNTER — Other Ambulatory Visit: Payer: Self-pay | Admitting: Internal Medicine

## 2023-06-11 ENCOUNTER — Ambulatory Visit: Payer: Medicare Other | Admitting: Pulmonary Disease

## 2023-06-13 ENCOUNTER — Ambulatory Visit: Payer: Medicare Other | Admitting: Pulmonary Disease

## 2023-06-13 ENCOUNTER — Encounter: Payer: Self-pay | Admitting: Pulmonary Disease

## 2023-06-13 VITALS — BP 144/80 | HR 66 | Ht 72.0 in | Wt 291.2 lb

## 2023-06-13 DIAGNOSIS — J4520 Mild intermittent asthma, uncomplicated: Secondary | ICD-10-CM

## 2023-06-13 DIAGNOSIS — J45909 Unspecified asthma, uncomplicated: Secondary | ICD-10-CM

## 2023-06-13 DIAGNOSIS — G4733 Obstructive sleep apnea (adult) (pediatric): Secondary | ICD-10-CM | POA: Diagnosis not present

## 2023-06-13 NOTE — Patient Instructions (Addendum)
Great to see you again  Continue CPAP at night it is helping  Consider Flonase 1 spray each nostril twice a day for up to a week or 2  if sinus congestion returns  Return to clinic in 6 months or sooner as needed

## 2023-06-13 NOTE — Progress Notes (Signed)
@Patient  ID: Ethan Lambert, male    DOB: June 02, 1961, 62 y.o.   MRN: [id number]  Chief Complaint  Patient presents with   Follow-up    Pt states asthma has been well. Not using inhaler, pt on cpap     Referring provider: Garnette Gunner, MD  HPI:   62 y.o. man whom I am seeing for evaluation of asthma and prolonged bronchitis as well as OSA on CPAP.  Most recent cardiology note x 2 reviewed.  Most recent infectious disease note reviewed.  Overall doing well.  Remains off inhalers.  Rare rescue inhaler use.  Been months.  Doing well breathing wise.  Reports adherence to CPAP.  Benefiting clinically.  Mild sinus congestion for last couple weeks.  Spontaneously improving.  Discussed expectant management.  HPI at initial visit: Prolonged bronchitis, brief hospital stay 04/2022.  Ongoing cough etc. for months afterwards.  Has been followed in pulmonary clinic.  Currently Arnuity.  He feels like his breathing is slowly improving.  Much better than prior.  He feels like Arnuity does help.  Not using albuterol as frequently.  Has some more sinus congestion over the last couple weeks with the pollens, change in weather etc.  Overall, it seems like over the last several months, breathing is steadily improved.  Maybe not totally back to baseline but better, significant so than prior.  He has a lot of muscles multiple issues.  This is his main limitation in terms of activity etc.  Questionaires / Pulmonary Flowsheets:   ACT:  Asthma Control Test ACT Total Score  10/31/2022  8:32 AM 20    MMRC:     No data to display          Epworth:     05/08/2022    8:50 AM  Results of the Epworth flowsheet  Sitting and reading 3  Watching TV 3  Sitting, inactive in a public place (e.g. a theatre or a meeting) 3  As a passenger in a car for an hour without a break 3  Lying down to rest in the afternoon when circumstances permit 3  Sitting and talking to someone 1  Sitting quietly after a  lunch without alcohol 3  In a car, while stopped for a few minutes in traffic 0  Total score 19    Tests:   FENO:  No results found for: "NITRICOXIDE"  PFT:     No data to display          WALK:      No data to display          Imaging: Personally reviewed and as per EMR discussion this note No results found.  Lab Results: Personally reviewed CBC    Component Value Date/Time   WBC 9.4 04/19/2022 0203   RBC 4.70 04/19/2022 0203   HGB 14.6 04/19/2022 0203   HGB 15.5 10/25/2019 0940   HCT 43.2 04/19/2022 0203   HCT 44.5 10/25/2019 0940   PLT 265 04/19/2022 0203   PLT 261 10/25/2019 0940   MCV 91.9 04/19/2022 0203   MCV 90 10/25/2019 0940   MCH 31.1 04/19/2022 0203   MCHC 33.8 04/19/2022 0203   RDW 13.4 04/19/2022 0203   RDW 13.1 10/25/2019 0940   LYMPHSABS 2.2 04/18/2022 0859   LYMPHSABS 2.4 05/11/2018 1022   MONOABS 0.9 04/18/2022 0859   EOSABS 0.3 04/18/2022 0859   EOSABS 0.1 05/11/2018 1022   BASOSABS 0.1 04/18/2022 0859   BASOSABS 0.1 05/11/2018 1022  BMET    Component Value Date/Time   NA 140 05/09/2023 1032   K 4.2 05/09/2023 1032   CL 103 05/09/2023 1032   CO2 27 05/09/2023 1032   GLUCOSE 123 (H) 05/09/2023 1032   GLUCOSE 166 (H) 04/19/2022 0203   BUN 15 05/09/2023 1032   CREATININE 0.94 05/09/2023 1032   CREATININE 0.89 10/31/2021 0853   CALCIUM 9.5 05/09/2023 1032   GFRNONAA >60 04/19/2022 0203   GFRNONAA 77 08/15/2020 0920   GFRAA 90 08/15/2020 0920    BNP    Component Value Date/Time   BNP 10.7 04/18/2022 0859    ProBNP    Component Value Date/Time   PROBNP 12.0 04/11/2022 1013    Specialty Problems       Pulmonary Problems   Bronchitis   Asthma   Loud snoring   - Patient has loud snoring. Epworth score 19. Needs home sleep study        Allergies  Allergen Reactions   Ms Contin [Morphine] Other (See Comments)    Hallucinations     Immunization History  Administered Date(s) Administered   H1N1  09/15/2008   Hepatitis B 08/05/2000, 03/25/2002, 09/30/2002   Influenza Split 04/07/2012, 03/14/2016   Influenza Whole 03/27/2006, 04/14/2008, 04/07/2009, 04/04/2010   Influenza, Seasonal, Injecte, Preservative Fre 04/08/2015   Influenza,inj,Quad PF,6+ Mos 05/12/2013, 04/06/2017, 05/12/2019, 04/12/2020, 04/19/2022   Janssen (J&J) SARS-COV-2 Vaccination 05/15/2020   PFIZER Comirnaty(Gray Top)Covid-19 Tri-Sucrose Vaccine 09/01/2020   Pneumococcal Conjugate-13 07/29/2019   Pneumococcal Polysaccharide-23 09/08/2001, 12/10/2007, 11/10/2012   Tdap 03/14/2016    Past Medical History:  Diagnosis Date   Anemia    Arthritis    "probably right ring finger joint" (05/28/2018)   Childhood asthma    as child none now   Clotting disorder (HCC)    CVA (cerebral vascular accident) (HCC) 1999 X 2   LEFT FRONTAL & OCCIPITAL , NON-HEMORRHAGIC; no residual   Family history of malignant neoplasm of gastrointestinal tract    Heart murmur    History of blood transfusion 1999   "related to the stroke"   History of kidney stones    HIV disease (HCC) dx 1999   MONITORED BY INFECTIOUS DISEASE -- DR Molly Maduro [facility]   Hypertension    Paroxysmal atrial fibrillation (HCC)    EP -- Dr. Nelly Laurence   Right knee meniscal tear    Seizures (HCC)    1999   Skin cancer    skin   Sleep apnea    "didn't follow thru w/mask" (05/28/2018)    Tobacco History: Social History   Tobacco Use  Smoking Status Never   Passive exposure: Never  Smokeless Tobacco Never   Counseling given: Not Answered   Continue to not smoke  Outpatient Encounter Medications as of 06/13/2023  Medication Sig   abacavir-dolutegravir-lamiVUDine (TRIUMEQ) 600-50-300 MG tablet Take 1 tablet by mouth daily.   albuterol (VENTOLIN HFA) 108 (90 Base) MCG/ACT inhaler TAKE 2 PUFFS BY MOUTH EVERY 6 HOURS AS NEEDED FOR WHEEZE OR SHORTNESS OF BREATH   apixaban (ELIQUIS) 5 MG TABS tablet Take 1 tablet (5 mg total) by mouth 2 (two) times daily.   ATIVAN  1 MG tablet Take 1 tablet to 2 tablets 90 minutes prior to MRI   benzonatate (TESSALON) 200 MG capsule Take 1 capsule (200 mg total) by mouth 3 (three) times daily as needed for cough.   diltiazem (CARDIZEM CD) 240 MG 24 hr capsule TAKE 1 CAPSULE BY MOUTH EVERY DAY   diltiazem (CARDIZEM) 30 MG  tablet Take 1 tablet every 4 hours AS NEEDED for afib heart rate >100   Evolocumab (REPATHA SURECLICK) 140 MG/ML SOAJ Inject 140 mg into the skin every 14 (fourteen) days.   ezetimibe (ZETIA) 10 MG tablet TAKE 1 TABLET BY MOUTH EVERY DAY   flecainide (TAMBOCOR) 100 MG tablet TAKE 1 TABLET BY MOUTH TWICE A DAY   fluticasone (FLONASE) 50 MCG/ACT nasal spray Place 2 sprays into both nostrils daily.   NEURONTIN 300 MG capsule Take 1 capsule as needed by oral route in the evening for 30 days.   traMADol (ULTRAM) 50 MG tablet    doxycycline (VIBRA-TABS) 100 MG tablet Take 1 tablet (100 mg total) by mouth 2 (two) times daily. (Patient not taking: Reported on 06/13/2023)   HYDROcodone-acetaminophen (NORCO/VICODIN) 5-325 MG tablet Take 1 tablet every 6 hours by oral route as needed for 5 days. (Patient not taking: Reported on 06/13/2023)   oxyCODONE (OXY IR/ROXICODONE) 5 MG immediate release tablet TAKE 1 TABLET BY MOUTH EVERY 4 TO 6 HOURS AS NEEDED FOR 5 DAYS (Patient not taking: Reported on 06/13/2023)   No facility-administered encounter medications on file as of 06/13/2023.     Review of Systems  Review of Systems  N/a Physical Exam  BP (!) 144/80   Pulse 66   Ht 6' (1.829 m)   Wt 291 lb 3.2 oz (132.1 kg)   SpO2 97%   BMI 39.49 kg/m   Wt Readings from Last 5 Encounters:  06/13/23 291 lb 3.2 oz (132.1 kg)  05/09/23 288 lb (130.6 kg)  11/14/22 285 lb (129.3 kg)  11/05/22 287 lb 6.4 oz (130.4 kg)  10/31/22 290 lb 6.4 oz (131.7 kg)    BMI Readings from Last 5 Encounters:  06/13/23 39.49 kg/m  05/09/23 42.53 kg/m  11/14/22 39.75 kg/m  11/05/22 40.08 kg/m  10/31/22 40.50 kg/m      Physical Exam General: Sitting in chair, no acute distress Eyes: EOMI, no icterus Neck: Supple, no JVP Pulmonary: Clear, no work of breathing Cardiovascular: Warm, no edema Abdomen: Thin, sounds present MSK: No synovitis, no joint effusion Neuro: Normal gait, no weakness Psych: Normal mood, full affect  Assessment & Plan:   Asthma: Clinical diagnosis with prolonged bronchitis over the winter 2023-2024.  Gradually improved with time.  Off maintenance inhaler for many months now stable.  Continue albuterol as needed.  Rare use reported.  Obstructive sleep apnea on CPAP: Diagnosed Home sleep test 08/22/2022, mild, AHI 5.2.  Excellent adherence to CPAP.  He reports significant improvement in sleep and daytime symptoms.  He is benefiting clinically from ongoing CPAP use not continue to encourage him to use this.   Return in about 6 months (around 12/12/2023) for f/u Dr. Judeth Horn.   Karren Burly, MD 06/13/2023

## 2023-06-20 ENCOUNTER — Telehealth: Payer: Self-pay | Admitting: Pulmonary Disease

## 2023-06-20 NOTE — Telephone Encounter (Signed)
We received a disability medical information form from [employer] on 06/12/23.  I called the patient today to find out if he has returned to work and he stated that he is on permanent disability retirement and is receiving Social Security disability payments.  He said he got a check from [employer] after July and called them because he believed it was sent in error.  He has put the check in savings in case they want it back.  I called [employer] and rep stated he would refer this information to the patient's claim representative who will call the patient and confirm about his disability and whether he should be receiving anything from them or not.  In the meantime, the rep told me to ignore the request and if it is still needed, [employer] will send another request via fax.   [employer] ph# [phone],  claim # [id number]

## 2023-07-02 ENCOUNTER — Other Ambulatory Visit: Payer: Self-pay | Admitting: Internal Medicine

## 2023-07-03 NOTE — Telephone Encounter (Signed)
 This is a Pt of Dr. Izora Ribas. He has passed his 3rd attempt. Does Dr. Izora Ribas want to refill? Please advise

## 2023-07-07 NOTE — Telephone Encounter (Signed)
 Refill flecainide 100 mg PO BID sent.  Pt had OV with Dr. Nelly Laurence in 05/09/23 med to continue.

## 2023-07-12 ENCOUNTER — Other Ambulatory Visit: Payer: Self-pay | Admitting: Cardiovascular Disease

## 2023-07-26 ENCOUNTER — Other Ambulatory Visit: Payer: Self-pay | Admitting: Internal Medicine

## 2023-08-04 ENCOUNTER — Other Ambulatory Visit: Payer: Self-pay | Admitting: Internal Medicine

## 2023-08-08 ENCOUNTER — Telehealth: Payer: Self-pay | Admitting: Pulmonary Disease

## 2023-08-08 NOTE — Telephone Encounter (Signed)
We received another request from [employer] [facility] for disability.  Dr. Judeth Horn completed the form and I have faxed it to [facility] fax# [phone].  Included a note that patient is now on SSA disability and is no longer working.

## 2023-08-21 DIAGNOSIS — M25511 Pain in right shoulder: Secondary | ICD-10-CM | POA: Diagnosis not present

## 2023-09-08 ENCOUNTER — Other Ambulatory Visit: Payer: Self-pay | Admitting: Internal Medicine

## 2023-09-11 DIAGNOSIS — M25511 Pain in right shoulder: Secondary | ICD-10-CM | POA: Diagnosis not present

## 2023-10-01 ENCOUNTER — Other Ambulatory Visit: Payer: Self-pay | Admitting: Internal Medicine

## 2023-10-02 ENCOUNTER — Telehealth: Payer: Self-pay | Admitting: Internal Medicine

## 2023-10-02 MED ORDER — EZETIMIBE 10 MG PO TABS: 10.0000 mg | ORAL_TABLET | Freq: Every day | ORAL | 0 refills | Status: DC

## 2023-10-02 NOTE — Telephone Encounter (Signed)
*  STAT* If patient is at the pharmacy, call can be transferred to refill team.   1. Which medications need to be refilled? (please list name of each medication and dose if known) ezetimibe (ZETIA) 10 MG tablet    4. Which pharmacy/location (including street and city if local pharmacy) is medication to be sent to?  [id number] - [place], [place] - [address]     5. Do they need a 30 day or 90 day supply? 90  Pt scheduled for 01/05/24, added to waitlist

## 2023-10-02 NOTE — Telephone Encounter (Signed)
 Pt's medication was sent to pt's pharmacy as requested. Confirmation received.

## 2023-10-09 DIAGNOSIS — M67921 Unspecified disorder of synovium and tendon, right upper arm: Secondary | ICD-10-CM | POA: Diagnosis not present

## 2023-10-09 DIAGNOSIS — M19011 Primary osteoarthritis, right shoulder: Secondary | ICD-10-CM | POA: Diagnosis not present

## 2023-10-09 DIAGNOSIS — M75111 Incomplete rotator cuff tear or rupture of right shoulder, not specified as traumatic: Secondary | ICD-10-CM | POA: Diagnosis not present

## 2023-10-09 DIAGNOSIS — M71811 Other specified bursopathies, right shoulder: Secondary | ICD-10-CM | POA: Diagnosis not present

## 2023-10-09 DIAGNOSIS — M75121 Complete rotator cuff tear or rupture of right shoulder, not specified as traumatic: Secondary | ICD-10-CM | POA: Diagnosis not present

## 2023-10-09 DIAGNOSIS — M67823 Other specified disorders of tendon, right elbow: Secondary | ICD-10-CM | POA: Diagnosis not present

## 2023-10-09 DIAGNOSIS — M24111 Other articular cartilage disorders, right shoulder: Secondary | ICD-10-CM | POA: Diagnosis not present

## 2023-11-06 ENCOUNTER — Encounter: Payer: Self-pay | Admitting: Cardiovascular Disease

## 2023-11-06 ENCOUNTER — Ambulatory Visit: Attending: Cardiovascular Disease | Admitting: Cardiovascular Disease

## 2023-11-06 VITALS — BP 134/88 | HR 69 | Ht 72.0 in | Wt 289.0 lb

## 2023-11-06 DIAGNOSIS — I480 Paroxysmal atrial fibrillation: Secondary | ICD-10-CM

## 2023-11-06 MED ORDER — FLECAINIDE ACETATE 50 MG PO TABS: 50.0000 mg | ORAL_TABLET | Freq: Two times a day (BID) | ORAL | 3 refills | Status: AC

## 2023-11-06 NOTE — Patient Instructions (Signed)
 Medication Instructions:  DECREASE Flecainide  50 mg twice daily *If you need a refill on your cardiac medications before your next appointment, please call your pharmacy*   Follow-Up: At [facility], you and your health needs are our priority.  As part of our continuing mission to provide you with exceptional heart care, our providers are all part of one team.  This team includes your primary Cardiologist (physician) and Advanced Practice Providers or APPs (Physician Assistants and Nurse Practitioners) who all work together to provide you with the care you need, when you need it.  Your next appointment:   3 month(s)  Provider:   Marlane Silver, MD

## 2023-11-06 NOTE — Progress Notes (Signed)
  Electrophysiology Office Note:    Date:  11/06/2023   ID:  Ethan Lambert, DOB 02-Feb-1961, MRN [id number]  PCP:  Catheryn Cluck, MD   [facility] Providers Cardiologist:  Jann Melody, MD Electrophysiologist:  Efraim Grange, MD     Referring MD: Catheryn Cluck, MD   Chief complaint: atrial fibrillation  History of Present Illness:    Ethan Lambert is a 63 y.o. male with a hx of PAF s/p ablation, subsequently managed with flecainide  as needed  He had an AF ablation by Dr. Nunzio Belch in 2019. He resumed flecainide  50 when he had recurrence. He has recently begun to have recurrences again, so flecainide  was increased to 100mg  PO BID. This helped with the burden and intensity of episodes, but he continues to still have symptoms.  He was diagnosed with sleep apnea and uses CPAP.  His sleep is much better.  He feels much better.  He was able to lose weight, but has since regained the weight and then some.  He and his partner have been remodeling her house and living in a camper for the past 7 months.  They recently moved back in.  He has plans for a few orthopedic surgeries in the upcoming near future.  He does not have chest pain, syncope, pre-syncope.      EKGs/Labs/Other Studies Reviewed Today:     TTE 11/30/21       Recent Labs: 05/09/2023: BUN 15; Creatinine, Ser 0.94; Potassium 4.2; Sodium 140     Physical Exam:    VS:  BP 134/88 (BP Location: Right Arm, Patient Position: Sitting, Cuff Size: Large)   Pulse 69   Ht 6' (1.829 m)   Wt 289 lb (131.1 kg)   SpO2 95%   BMI 39.20 kg/m     Wt Readings from Last 3 Encounters:  11/06/23 289 lb (131.1 kg)  06/13/23 291 lb 3.2 oz (132.1 kg)  05/09/23 288 lb (130.6 kg)     GEN:  Well nourished, well developed in no acute distress CARDIAC: RRR, no murmurs, rubs, gallops RESPIRATORY:  Normal work of breathing MUSCULOSKELETAL: no edema    ASSESSMENT & PLAN:    Atrial fibrillation:   having recurrence despite being on flecainide  prior to starting CPAP.   doing better now that he is using CPAP.   I encouraged weight loss.   If he is able to lose a significant amount of weight, he will have a good chance of remaining in normal rhythm with flecainide .  High risk medication:  on flecainide . Continue diltiazem .  With RBBB -- stable on 100mg  of flecainide  Will reduce flecainide  to 50 mg twice daily  OSA:  now managed with CPAP  Obesity:  we discussed the importance of weight loss for maintaining sinus rhythm. Specifically, we discussed the importance of counting calories and portion size        Medication Adjustments/Labs and Tests Ordered: Current medicines are reviewed at length with the patient today.  Concerns regarding medicines are outlined above.  Orders Placed This Encounter  Procedures   EKG 12-Lead   No orders of the defined types were placed in this encounter.    Signed, Efraim Grange, MD  11/06/2023 10:21 AM    [facility]

## 2023-11-13 ENCOUNTER — Telehealth: Payer: Self-pay

## 2023-11-13 ENCOUNTER — Other Ambulatory Visit: Payer: Self-pay

## 2023-11-13 ENCOUNTER — Telehealth: Payer: Self-pay | Admitting: *Deleted

## 2023-11-13 DIAGNOSIS — Z01818 Encounter for other preprocedural examination: Secondary | ICD-10-CM

## 2023-11-13 DIAGNOSIS — M75121 Complete rotator cuff tear or rupture of right shoulder, not specified as traumatic: Secondary | ICD-10-CM | POA: Diagnosis not present

## 2023-11-13 DIAGNOSIS — M12811 Other specific arthropathies, not elsewhere classified, right shoulder: Secondary | ICD-10-CM | POA: Diagnosis not present

## 2023-11-13 DIAGNOSIS — M75101 Unspecified rotator cuff tear or rupture of right shoulder, not specified as traumatic: Secondary | ICD-10-CM | POA: Diagnosis not present

## 2023-11-13 NOTE — Addendum Note (Signed)
 Addended by: Freyja Govea on: 11/13/2023 02:35 PM   Modules accepted: Orders

## 2023-11-13 NOTE — Telephone Encounter (Signed)
 Called and spoke to patient who has been scheduled for televisit and CBC has been ordered and patient was made aware who stated he will come tomorrow to get those done.

## 2023-11-13 NOTE — Telephone Encounter (Signed)
   Pre-operative Risk Assessment    Patient Name: Ethan Lambert  DOB: 1961-03-01 MRN: [id number]   Date of last office visit: 05/09/23 DR. MEALOR Date of next office visit: 01/05/24 DR. CHANDRSEKHAR 1 YR F/U; 8/15/2 5 DR. MEALOR 18M F/U   Request for Surgical Clearance    Procedure:  RIGHT REVERSE TOTAL SHOULDER ARTHROPLASTY  Date of Surgery:  Clearance TBD                                Surgeon:  DR. Carter Clare Surgeon's Group or Practice Name:  [facility] Phone number:  [phone] KERRI MAZE Fax number:  [phone]   Type of Clearance Requested:   - Medical  - Pharmacy:  Hold Apixaban  (Eliquis )     Type of Anesthesia:  CHOICE   Additional requests/questions:    [name]   11/13/2023, 1:49 PM

## 2023-11-13 NOTE — Telephone Encounter (Signed)
 Patient has been scheduled for televisit med rec and consent done     Patient Consent for Virtual Visit         Ethan Lambert has provided verbal consent on 11/13/2023 for a virtual visit (video or telephone).   CONSENT FOR VIRTUAL VISIT FOR:  Ethan Lambert  By participating in this virtual visit I agree to the following:  I hereby voluntarily request, consent and authorize [facility] and its employed or contracted physicians, physician assistants, nurse practitioners or other licensed health care professionals (the Practitioner), to provide me with telemedicine health care services (the "Services") as deemed necessary by the treating Practitioner. I acknowledge and consent to receive the Services by the Practitioner via telemedicine. I understand that the telemedicine visit will involve communicating with the Practitioner through live audiovisual communication technology and the disclosure of certain medical information by electronic transmission. I acknowledge that I have been given the opportunity to request an in-person assessment or other available alternative prior to the telemedicine visit and am voluntarily participating in the telemedicine visit.  I understand that I have the right to withhold or withdraw my consent to the use of telemedicine in the course of my care at any time, without affecting my right to future care or treatment, and that the Practitioner or I may terminate the telemedicine visit at any time. I understand that I have the right to inspect all information obtained and/or recorded in the course of the telemedicine visit and may receive copies of available information for a reasonable fee.  I understand that some of the potential risks of receiving the Services via telemedicine include:  Delay or interruption in medical evaluation due to technological equipment failure or disruption; Information transmitted may not be sufficient (e.g. poor resolution of  images) to allow for appropriate medical decision making by the Practitioner; and/or  In rare instances, security protocols could fail, causing a breach of personal health information.  Furthermore, I acknowledge that it is my responsibility to provide information about my medical history, conditions and care that is complete and accurate to the best of my ability. I acknowledge that Practitioner's advice, recommendations, and/or decision may be based on factors not within their control, such as incomplete or inaccurate data provided by me or distortions of diagnostic images or specimens that may result from electronic transmissions. I understand that the practice of medicine is not an exact science and that Practitioner makes no warranties or guarantees regarding treatment outcomes. I acknowledge that a copy of this consent can be made available to me via my patient portal [facility] MyChart), or I can request a printed copy by calling the office of [facility].    I understand that my insurance will be billed for this visit.   I have read or had this consent read to me. I understand the contents of this consent, which adequately explains the benefits and risks of the Services being provided via telemedicine.  I have been provided ample opportunity to ask questions regarding this consent and the Services and have had my questions answered to my satisfaction. I give my informed consent for the services to be provided through the use of telemedicine in my medical care

## 2023-11-13 NOTE — Telephone Encounter (Signed)
 Please order updated CBC for patient to complete prior to guidance being provided on holding Eliquis .  He will also need a virtual visit for upcoming procedure.  Thanks, Renelda Carry

## 2023-11-18 DIAGNOSIS — Z01818 Encounter for other preprocedural examination: Secondary | ICD-10-CM | POA: Diagnosis not present

## 2023-11-18 LAB — CBC
Hematocrit: 47.8 % (ref 37.5–51.0)
Hemoglobin: 15.5 g/dL (ref 13.0–17.7)
MCH: 30.6 pg (ref 26.6–33.0)
MCHC: 32.4 g/dL (ref 31.5–35.7)
MCV: 95 fL (ref 79–97)
Platelets: 286 10*3/uL (ref 150–450)
RBC: 5.06 x10E6/uL (ref 4.14–5.80)
RDW: 13.4 % (ref 11.6–15.4)
WBC: 8.5 10*3/uL (ref 3.4–10.8)

## 2023-11-19 ENCOUNTER — Ambulatory Visit: Attending: Cardiology | Admitting: Nurse Practitioner

## 2023-11-19 ENCOUNTER — Ambulatory Visit: Payer: Self-pay | Admitting: Nurse Practitioner

## 2023-11-19 DIAGNOSIS — Z01810 Encounter for preprocedural cardiovascular examination: Secondary | ICD-10-CM

## 2023-11-19 NOTE — Progress Notes (Signed)
 Virtual Visit via Telephone Note   Because of Ethan Lambert co-morbid illnesses, he is at least at moderate risk for complications without adequate follow up.  This format is felt to be most appropriate for this patient at this time.  Due to technical limitations with video connection (technology), today's appointment will be conducted as an audio only telehealth visit, and Ethan Lambert verbally agreed to proceed in this manner.   All issues noted in this document were discussed and addressed.  No physical exam could be performed with this format.  Evaluation Performed:  Preoperative cardiovascular risk assessment _____________   Date:  11/19/2023   Patient ID:  Ethan Lambert, DOB 11-14-1960, MRN [id number] Patient Location:  Home Provider location:   Office  Primary Care Provider:  Catheryn Cluck, MD Primary Cardiologist:  Ethan Melody, MD  Chief Complaint / Patient Profile   63 y.o. y/o male with a h/o paroxysmal atrial fibrillation s/p ablation, hypertension, CVA, OSA, and obesity who is pending right reverse total shoulder arthroplasty with Ethan. Carter Clare of [facility] and presents today for telephonic preoperative cardiovascular risk assessment.  History of Present Illness    Ethan Lambert is a 63 y.o. male who presents via audio/video conferencing for a telehealth visit today.  Pt was last seen in cardiology clinic on 11/06/2023 by Ethan. Arlester Ladd.  At that time Ethan Lambert was doing well.  The patient is now pending procedure as outlined above. Since his last visit, he has done well from a cardiac standpoint.   He denies chest pain, palpitations, dyspnea, pnd, orthopnea, n, v, dizziness, syncope, edema, weight gain, or early satiety. All other systems reviewed and are otherwise negative except as noted above.   Past Medical History    Past Medical History:  Diagnosis Date   Anemia    Arthritis    "probably right ring finger joint" (05/28/2018)    Childhood asthma    as child none now   Clotting disorder (HCC)    CVA (cerebral vascular accident) (HCC) 1999 X 2   LEFT FRONTAL & OCCIPITAL , NON-HEMORRHAGIC; no residual   Family history of malignant neoplasm of gastrointestinal tract    Heart murmur    History of blood transfusion 1999   "related to the stroke"   History of kidney stones    HIV disease (HCC) dx 1999   MONITORED BY INFECTIOUS DISEASE -- Ethan Lambert   Hypertension    Paroxysmal atrial fibrillation (HCC)    EP -- Ethan. Arlester Ladd   Right knee meniscal tear    Seizures (HCC)    1999   Skin cancer    skin   Sleep apnea    "didn't follow thru w/mask" (05/28/2018)   Past Surgical History:  Procedure Laterality Date   APPENDECTOMY  06-09-2003   ATRIAL FIBRILLATION ABLATION  05/28/2018   ATRIAL FIBRILLATION ABLATION N/A 05/28/2018   Procedure: ATRIAL FIBRILLATION ABLATION;  Surgeon: Ethan Needle, MD;  Location: [facility];  Service: Cardiovascular;  Laterality: N/A;   BACK SURGERY     INGUINAL HERNIA REPAIR Bilateral 1990s   KNEE ARTHROSCOPY WITH MEDIAL MENISECTOMY Right 12/31/2013   Procedure: RIGHT KNEE ARTHROSCOPY PARTIAL MEDIAL MENISCECTOMY DEBRIDEMENT AND CHONDROPLASTY ;  Surgeon: Genevie Kerns, MD;  Location: [facility];  Service: Orthopedics;  Laterality: Right;   KNEE ARTHROSCOPY WITH MEDIAL MENISECTOMY Left 01/27/2019   Procedure: LEFT KNEE ARTHROSCOPY WITH PARTIAL MEDIAL MENISCECTOMY;  Surgeon: Wes Hamman, MD;  Location: [facility];  Service: Orthopedics;  Laterality: Left;   LAPAROSCOPIC CHOLECYSTECTOMY  07-08-2003   LASER ABLATION ANAL CONDYLOMA  05-18-2001   LUMBAR LAMINECTOMY/DECOMPRESSION MICRODISCECTOMY Left 03/15/2015   Procedure: Left Lumbar five- sacral one microdiskectomy;  Surgeon: Ethan Blonder, MD;  Location: [facility];  Service: Neurosurgery;  Laterality: Left;  Left L5S1 microdiskectomy   NEGATIVE SLEEP STUDY  01-03-2012  in epic   SKIN CANCER  EXCISION Right    "cut it off my shoulder"   TRANSTHORACIC ECHOCARDIOGRAM  12-01-2008   MODERATE LVH/  EF 55-60%/  MILD MR/  NEGATIVE BUBBLE STUDY    Allergies  Allergies  Allergen Reactions   Ms Contin [Morphine] Other (See Comments)    Hallucinations    Morphine And Codeine Other (See Comments)    Home Medications    Prior to Admission medications   Medication Sig Start Date End Date Taking? Authorizing Provider  abacavir -dolutegravir -lamiVUDine  (TRIUMEQ ) 600-50-300 MG tablet Take 1 tablet by mouth daily. 03/06/23   Ethan Render, MD  albuterol  (VENTOLIN  HFA) 108 (90 Base) MCG/ACT inhaler TAKE 2 PUFFS BY MOUTH EVERY 6 HOURS AS NEEDED FOR WHEEZE OR SHORTNESS OF BREATH 07/02/22   Ethan Cluck, MD  apixaban  (ELIQUIS ) 5 MG TABS tablet Take 1 tablet (5 mg total) by mouth 2 (two) times daily. 04/23/23   Ethan Lambert, Ethan E, MD  ATIVAN 1 MG tablet Take 1 tablet to 2 tablets 90 minutes prior to MRI 09/25/22   [provider]  benzonatate  (TESSALON ) 200 MG capsule Take 1 capsule (200 mg total) by mouth 3 (three) times daily as needed for cough. 04/24/22   Ethan Lambert, Ethan B, MD  diltiazem  (CARDIZEM  CD) 240 MG 24 hr capsule TAKE 1 CAPSULE BY MOUTH EVERY DAY 07/14/23   Ethan Lambert, Ethan E, MD  diltiazem  (CARDIZEM ) 30 MG tablet Take 1 tablet every 4 hours AS NEEDED for afib heart rate >100 08/18/20   Ethan Lauth, NP  doxycycline  (VIBRA -TABS) 100 MG tablet Take 1 tablet (100 mg total) by mouth 2 (two) times daily. Patient not taking: Reported on 11/06/2023 05/07/22   Ethan Lambert, Ethan B, MD  Evolocumab  (REPATHA  SURECLICK) 140 MG/ML SOAJ Inject 140 mg into the skin every 14 (fourteen) days. 03/28/23   Ethan Lambert, Ethan Chapel, MD  ezetimibe  (ZETIA ) 10 MG tablet Take 1 tablet (10 mg total) by mouth daily. 10/02/23   Ethan Melody, MD  flecainide  (TAMBOCOR ) 50 MG tablet Take 1 tablet (50 mg total) by mouth 2 (two) times daily. 11/06/23   Ethan Lambert, Ethan E, MD  fluticasone  (FLONASE )  50 MCG/ACT nasal spray Place 2 sprays into both nostrils daily. 04/15/22   Ethan Cluck, MD  HYDROcodone -acetaminophen  (NORCO/VICODIN) 5-325 MG tablet  08/26/22   [provider]  methocarbamol  (ROBAXIN ) 500 MG tablet Take 500 mg by mouth every 6 (six) hours as needed for muscle spasms. 09/11/23   [provider]  NEURONTIN 300 MG capsule Take 1 capsule as needed by oral route in the evening for 30 days. 09/25/22   [provider]  oxyCODONE  (OXY IR/ROXICODONE ) 5 MG immediate release tablet  09/06/22   [provider]  traMADol  (ULTRAM ) 50 MG tablet     [provider]    Physical Exam    Vital Signs:  DOLPHUS LINCH does not have vital signs available for review today.  Given telephonic nature of communication, physical exam is limited. AAOx3. NAD. Normal affect.  Speech and respirations are unlabored.  Accessory Clinical Findings  None  Assessment & Plan    1.  Preoperative Cardiovascular Risk Assessment:  According to the Revised Cardiac Risk Index (RCRI), his Perioperative Risk of Major Cardiac Event is (%): 0.9. His Functional Capacity in METs is: 5.62 according to the Duke Activity Status Index (DASI). Therefore, based on ACC/AHA guidelines, patient would be at acceptable risk for the planned procedure without further cardiovascular testing.   The patient was advised that if he develops new symptoms prior to surgery to contact our office to arrange for a follow-up visit, and he verbalized understanding.  Per office protocol, patient can hold Eliquis  for 3 days prior to procedure.  Patient will not need bridging with Lovenox (enoxaparin) around procedure.Please resume Eliquis  as soon as possible postprocedure, at the discretion of the surgeon.   A copy of this note will be routed to requesting surgeon.  Time:   Today, I have spent 5 minutes with the patient with telehealth technology discussing medical history, symptoms, and  management plan.     Jude Norton, NP  11/19/2023, 2:54 PM

## 2023-11-19 NOTE — Telephone Encounter (Signed)
   Name: Ethan Lambert  DOB: 1961-02-01  MRN: [id number]  Primary Cardiologist: Jann Melody, MD   Preoperative team, please contact this patient and set up a phone call appointment for further preoperative risk assessment. Please obtain consent and complete medication review. Thank you for your help.  I confirm that guidance regarding antiplatelet and oral anticoagulation therapy has been completed and, if necessary, noted below.  I also confirmed the patient resides in the state of [place] . As per [facility] Medical Board telemedicine laws, the patient must reside in the state in which the provider is licensed.   Patient with diagnosis of atrial fibrillation on Eliquis  for anticoagulation.     Procedure:  RIGHT REVERSE TOTAL SHOULDER ARTHROPLASTY   Date of Surgery:  Clearance TBD      CHA2DS2-VASc Score = 3   This indicates a 3.2% annual risk of stroke. The patient's score is based upon: CHF History: 0 HTN History: 1 Diabetes History: 0 Stroke History: 2 Vascular Disease History: 0 Age Score: 0 Gender Score: 0   Per chart has hx of CVA/TIA going back before 2010   CrCl > 100 Platelet count 286   Patient has not had an Afib/aflutter ablation within the last 3 months or DCCV within the last 30 days   Per office protocol, patient can hold Eliquis  for 3 days prior to procedure.   Patient will not need bridging with Lovenox (enoxaparin) around procedure.   Leala Prince, PA-C 11/19/2023, 2:09 PM [facility]

## 2023-11-19 NOTE — Telephone Encounter (Signed)
 Patient with diagnosis of atrial fibrillation on Eliquis  for anticoagulation.    Procedure:  RIGHT REVERSE TOTAL SHOULDER ARTHROPLASTY   Date of Surgery:  Clearance TBD    CHA2DS2-VASc Score = 3   This indicates a 3.2% annual risk of stroke. The patient's score is based upon: CHF History: 0 HTN History: 1 Diabetes History: 0 Stroke History: 2 Vascular Disease History: 0 Age Score: 0 Gender Score: 0   Per chart has hx of CVA/TIA going back before 2010  CrCl > 100 Platelet count 286  Patient has not had an Afib/aflutter ablation within the last 3 months or DCCV within the last 30 days  Per office protocol, patient can hold Eliquis  for 3 days prior to procedure.   Patient will not need bridging with Lovenox (enoxaparin) around procedure.  **This guidance is not considered finalized until pre-operative APP has relayed final recommendations.**

## 2023-11-19 NOTE — Telephone Encounter (Signed)
 Pt has been cleared for his procedure on 5/28 by Marlana Silvan.

## 2023-11-21 ENCOUNTER — Telehealth: Payer: Self-pay

## 2023-11-21 NOTE — Telephone Encounter (Signed)
 Preoperative clearance form.

## 2023-11-26 NOTE — Telephone Encounter (Signed)
 Paperwork was placed in folder on 11/26/2023 OV scheduled for 12/01/2023.

## 2023-12-01 ENCOUNTER — Ambulatory Visit: Admitting: Family Medicine

## 2023-12-01 ENCOUNTER — Encounter: Payer: Self-pay | Admitting: Family Medicine

## 2023-12-01 VITALS — BP 137/70 | HR 60 | Temp 97.4°F | Ht 71.0 in | Wt 293.2 lb

## 2023-12-01 DIAGNOSIS — I480 Paroxysmal atrial fibrillation: Secondary | ICD-10-CM

## 2023-12-01 DIAGNOSIS — Z0000 Encounter for general adult medical examination without abnormal findings: Secondary | ICD-10-CM

## 2023-12-01 DIAGNOSIS — E66813 Obesity, class 3: Secondary | ICD-10-CM

## 2023-12-01 DIAGNOSIS — M19011 Primary osteoarthritis, right shoulder: Secondary | ICD-10-CM | POA: Diagnosis not present

## 2023-12-01 DIAGNOSIS — I351 Nonrheumatic aortic (valve) insufficiency: Secondary | ICD-10-CM

## 2023-12-01 DIAGNOSIS — R350 Frequency of micturition: Secondary | ICD-10-CM | POA: Diagnosis not present

## 2023-12-01 DIAGNOSIS — I10 Essential (primary) hypertension: Secondary | ICD-10-CM | POA: Diagnosis not present

## 2023-12-01 DIAGNOSIS — J4520 Mild intermittent asthma, uncomplicated: Secondary | ICD-10-CM

## 2023-12-01 DIAGNOSIS — Z6841 Body Mass Index (BMI) 40.0 and over, adult: Secondary | ICD-10-CM | POA: Insufficient documentation

## 2023-12-01 DIAGNOSIS — E782 Mixed hyperlipidemia: Secondary | ICD-10-CM

## 2023-12-01 DIAGNOSIS — G6289 Other specified polyneuropathies: Secondary | ICD-10-CM | POA: Diagnosis not present

## 2023-12-01 DIAGNOSIS — I700 Atherosclerosis of aorta: Secondary | ICD-10-CM

## 2023-12-01 DIAGNOSIS — G4733 Obstructive sleep apnea (adult) (pediatric): Secondary | ICD-10-CM

## 2023-12-01 DIAGNOSIS — G629 Polyneuropathy, unspecified: Secondary | ICD-10-CM | POA: Insufficient documentation

## 2023-12-01 DIAGNOSIS — Z01818 Encounter for other preprocedural examination: Secondary | ICD-10-CM

## 2023-12-01 DIAGNOSIS — L989 Disorder of the skin and subcutaneous tissue, unspecified: Secondary | ICD-10-CM

## 2023-12-01 DIAGNOSIS — I6350 Cerebral infarction due to unspecified occlusion or stenosis of unspecified cerebral artery: Secondary | ICD-10-CM

## 2023-12-01 DIAGNOSIS — Z125 Encounter for screening for malignant neoplasm of prostate: Secondary | ICD-10-CM

## 2023-12-01 NOTE — Progress Notes (Signed)
 Assessment  Assessment/Plan:   Assessment and Plan Assessment & Plan Preoperative Clearance for Right Shoulder Surgery Scheduled for right shoulder surgery due to arthritis of the right acromioclavicular joint. Comorbidities include aortic atherosclerosis, hyperlipidemia, paroxysmal atrial fibrillation, mild intermittent asthma, central hypertension, sleep apnea, and severe obesity. Cardiologist has cleared him for surgery. Currently mildly hypertensive with BP 147/79. Reports burning sensation in feet, possibly related to diabetes or a bulging disc. Concerns about diabetes due to history of elevated blood sugars. Additional lab work is necessary to ensure optimal management before surgery. - Order hemoglobin A1c, metabolic panel, CBC, lipid panel, CMP, urinalysis, creatinine, PSA, UPEP, and SPEP - Recheck blood pressure to ensure it is under 140/90 - Ensure fasting before lab work for accurate cholesterol levels  Atrial Fibrillation Paroxysmal atrial fibrillation managed with apixaban  205 mg BID, diltiazem  240 mg once daily, and flecainide  50 mg BID. Cardiology clearance for surgery obtained.  Hypertension Mildly hypertensive with BP 147/79. Blood pressure tends to run high. Control is crucial before surgery. - Recheck blood pressure to ensure it is under 140/90  Possible Diabetes Mellitus Reports burning sensation in feet and intermittent shakes when skipping meals, suggestive of diabetes. History of elevated blood sugars. Lab work needed to assess for diabetes and related complications. - Order hemoglobin A1c, metabolic panel, and urinalysis  Hyperlipidemia On Repatha  140 mg every two weeks and Zetia  10 mg daily. Cholesterol levels need assessment as he has not been checked recently. Fasting lab work is necessary for accuracy. - Order lipid panel  Obstructive Sleep Apnea Has obstructive sleep apnea and uses CPAP. Difficulty obtaining new supplies but continues to use old equipment.  Follow-up with pulmonology scheduled.  Asthma Mild intermittent asthma, uses albuterol  occasionally. No significant respiratory symptoms reported.  Venous Stasis Dermatitis Dry skin on legs likely due to mild venous insufficiency. Not expected to interfere with surgery. - Apply lotion or hydrocortisone to dry skin as needed  Follow-up Requires follow-up with specialists and completion of lab work before surgery. Reports a skin lesion on left cheek, possibly cancerous, and plans to contact Dr. Del Favia for evaluation. - Schedule lab work for fasting blood tests - Follow up with infectious disease specialist as scheduled - Contact Dr. Del Favia for evaluation of skin lesion on left cheek     Medications Discontinued During This Encounter  Medication Reason   ATIVAN 1 MG tablet Completed Course   benzonatate  (TESSALON ) 200 MG capsule Completed Course   doxycycline  (VIBRA -TABS) 100 MG tablet Completed Course   NEURONTIN 300 MG capsule Completed Course   traMADol  (ULTRAM ) 50 MG tablet Completed Course   methocarbamol  (ROBAXIN ) 500 MG tablet Completed Course   HYDROcodone -acetaminophen  (NORCO/VICODIN) 5-325 MG tablet Completed Course   fluticasone  (FLONASE ) 50 MCG/ACT nasal spray     Patient Counseling(The following topics were reviewed and/or handout was given):  -Nutrition: Stressed importance of moderation in sodium/caffeine intake, saturated fat and cholesterol, caloric balance, sufficient intake of fresh fruits, vegetables, and fiber.  -Stressed the importance of regular exercise.   -Substance Abuse: Discussed cessation/primary prevention of tobacco, alcohol, or other drug use; driving or other dangerous activities under the influence; availability of treatment for abuse.   -Injury prevention: Discussed safety belts, safety helmets, smoke detector, smoking near bedding or upholstery.   -Sexuality: Discussed sexually transmitted diseases, partner selection, use of condoms, avoidance of  unintended pregnancy and contraceptive alternatives.   -Dental health: Discussed importance of regular tooth brushing, flossing, and dental visits.  -Health maintenance and immunizations reviewed. Please  refer to Health maintenance section.  Return for fasting labs.        Subjective:   Encounter date: 12/01/2023  Chief Complaint  Patient presents with   Pre-op  Exam    Pre-Op  clearance for right shoulder surgery     Discussed the use of AI scribe software for clinical note transcription with the patient, who gave verbal consent to proceed.  History of Present Illness Ethan Lambert is a 63 year old male who presents for preoperative clearance for right shoulder surgery.  He has arthritis of the right acromioclavicular joint and a massive tear in the shoulder, which has led to significant pain and has discouraged him from exercising. He is awaiting approval for surgery.  He experiences a burning sensation in his feet and toes, particularly at night, which sometimes prevents him from sleeping. The burning is severe enough to wake him up at night. He suspects it might be related to a bulging disc in his back or possibly diabetes, as his brother is diabetic. He also notes changes in skin color on his feet.  He experiences 'the shakes' if he skips meals, which he describes as feeling like he needs sugar and resolves after eating something sweet. He has a history of elevated blood sugars and is concerned about diabetes.  He has mild intermittent asthma, which is well-controlled with infrequent use of albuterol . He uses a CPAP machine for sleep apnea but has had difficulty obtaining new supplies. He reports shortness of breath with exertion, which he attributes to deconditioning due to knee and shoulder issues.  He has a history of a stroke affecting his left side, resulting in some residual weakness. He also mentions a skin lesion on his left cheek that he suspects might be cancerous and  plans to follow up with a dermatologist.       11/14/2022    8:35 AM 01/24/2022   10:31 AM 10/31/2021    8:36 AM 09/01/2020    9:42 AM 05/22/2020   12:55 PM  Depression screen PHQ 2/9  Decreased Interest 0 2 0 0 0  Down, Depressed, Hopeless 0 0 0 0 0  PHQ - 2 Score 0 2 0 0 0  Altered sleeping  3     Tired, decreased energy  2     Change in appetite  2     Feeling bad or failure about yourself   0     Trouble concentrating  0     Moving slowly or fidgety/restless  0     Suicidal thoughts  0     PHQ-9 Score  9     Difficult doing work/chores  Extremely dIfficult          01/24/2022   10:31 AM 11/17/2020    8:58 AM 11/01/2019    8:50 AM  GAD 7 : Generalized Anxiety Score  Nervous, Anxious, on Edge 0 0 0  Control/stop worrying 0 0 0  Worry too much - different things 0 0 0  Trouble relaxing 2 0 0  Restless 0 0 0  Easily annoyed or irritable 0 0 0  Afraid - awful might happen 0 0 0  Total GAD 7 Score 2 0 0  Anxiety Difficulty Extremely difficult Not difficult at all Not difficult at all    Health Maintenance Due  Topic Date Due   Medicare Annual Wellness (AWV)  Never done   Zoster Vaccines- Shingrix (1 of 2) Never done   COVID-19 Vaccine (3 - 2024-25  season) 02/23/2023      PMH:  The following were reviewed and entered/updated in epic: Past Medical History:  Diagnosis Date   Anemia    Arthritis    "probably right ring finger joint" (05/28/2018)   Childhood asthma    as child none now   Clotting disorder (HCC)    CVA (cerebral vascular accident) (HCC) 1999 X 2   LEFT FRONTAL & OCCIPITAL , NON-HEMORRHAGIC; no residual   Family history of malignant neoplasm of gastrointestinal tract    Heart murmur    History of blood transfusion 1999   "related to the stroke"   History of kidney stones    HIV disease (HCC) dx 1999   MONITORED BY INFECTIOUS DISEASE -- DR Porfirio Bristol COMER   Hypertension    Paroxysmal atrial fibrillation (HCC)    EP -- Dr. Arlester Ladd   Right knee  meniscal tear    Seizures (HCC)    1999   Skin cancer    skin   Sleep apnea    "didn't follow thru w/mask" (05/28/2018)    Patient Active Problem List   Diagnosis Date Noted   OSA on CPAP 06/13/2023   Chronic pain of right knee 07/22/2022   Asthma 05/08/2022   Loud snoring 05/08/2022   Atrial fibrillation with RVR (HCC) 04/19/2022   Paroxysmal atrial fibrillation with RVR (HCC) 04/18/2022   Insomnia 01/24/2022   Nonrheumatic aortic valve insufficiency 11/08/2021   Encounter for screening for infections with predominantly sexual mode of transmission 10/31/2021   Aortic atherosclerosis (HCC) 07/06/2021   Morbid obesity (HCC) 09/14/2020   Bronchitis 09/14/2020   Hyperlipidemia 09/14/2020   Acute medial meniscus tear, left, subsequent encounter 01/22/2019   Rotator cuff syndrome of left shoulder 07/10/2018   Paroxysmal atrial fibrillation (HCC) 05/28/2018   Subacromial bursitis of left shoulder joint 05/19/2018   Medication monitoring encounter 02/12/2017   Sprain of shoulder, right 10/01/2016   Shoulder pain, right 09/26/2016   Acromioclavicular joint arthritis 09/26/2016   Encounter for long-term (current) use of medications 06/08/2015   HNP (herniated nucleus pulposus), lumbar 03/15/2015   Fatigue 01/26/2015   S/P right knee arthroscopy 12/31/2013   INGUINAL HERNIORRHAPHIES, BILATERAL, HX OF 01/15/2010   VARICOCELE 07/05/2009   HEMATOSPERMIA 06/27/2009   Essential hypertension 12/08/2008   Atrial fibrillation (HCC) 12/08/2008   Cerebral artery occlusion with cerebral infarction (HCC) 12/08/2008   HIV infection (HCC) 11/18/2006    Past Surgical History:  Procedure Laterality Date   APPENDECTOMY  06-09-2003   ATRIAL FIBRILLATION ABLATION  05/28/2018   ATRIAL FIBRILLATION ABLATION N/A 05/28/2018   Procedure: ATRIAL FIBRILLATION ABLATION;  Surgeon: Jolly Needle, MD;  Location: [facility];  Service: Cardiovascular;  Laterality: N/A;   BACK SURGERY     INGUINAL  HERNIA REPAIR Bilateral 1990s   KNEE ARTHROSCOPY WITH MEDIAL MENISECTOMY Right 12/31/2013   Procedure: RIGHT KNEE ARTHROSCOPY PARTIAL MEDIAL MENISCECTOMY DEBRIDEMENT AND CHONDROPLASTY ;  Surgeon: Genevie Kerns, MD;  Location: [facility] [place];  Service: Orthopedics;  Laterality: Right;   KNEE ARTHROSCOPY WITH MEDIAL MENISECTOMY Left 01/27/2019   Procedure: LEFT KNEE ARTHROSCOPY WITH PARTIAL MEDIAL MENISCECTOMY;  Surgeon: Wes Hamman, MD;  Location: [facility];  Service: Orthopedics;  Laterality: Left;   LAPAROSCOPIC CHOLECYSTECTOMY  07-08-2003   LASER ABLATION ANAL CONDYLOMA  05-18-2001   LUMBAR LAMINECTOMY/DECOMPRESSION MICRODISCECTOMY Left 03/15/2015   Procedure: Left Lumbar five- sacral one microdiskectomy;  Surgeon: Augusto Blonder, MD;  Location: [facility];  Service: Neurosurgery;  Laterality: Left;  Left L5S1 microdiskectomy   NEGATIVE SLEEP STUDY  01-03-2012  in epic   SKIN CANCER EXCISION Right    "cut it off my shoulder"   TRANSTHORACIC ECHOCARDIOGRAM  12-01-2008   MODERATE LVH/  EF 55-60%/  MILD MR/  NEGATIVE BUBBLE STUDY    Family History  Problem Relation Age of Onset   Arrhythmia Father    Melanoma Father    Heart attack Brother    Prostate cancer Maternal Grandfather    Colon cancer Maternal Grandfather    Lung cancer Mother        smoker   Hypertension Mother    Hyperlipidemia Mother    Other Maternal Grandmother        harding of the arteries   Rectal cancer Neg Hx    Stomach cancer Neg Hx     Medications- reviewed and updated Outpatient Medications Prior to Visit  Medication Sig Dispense Refill   abacavir -dolutegravir -lamiVUDine  (TRIUMEQ ) 600-50-300 MG tablet Take 1 tablet by mouth daily. 30 tablet 11   albuterol  (VENTOLIN  HFA) 108 (90 Base) MCG/ACT inhaler TAKE 2 PUFFS BY MOUTH EVERY 6 HOURS AS NEEDED FOR WHEEZE OR SHORTNESS OF BREATH 8.5 each 0   apixaban  (ELIQUIS ) 5 MG TABS tablet Take 1 tablet (5 mg total) by mouth 2 (two) times  daily. 180 tablet 1   diltiazem  (CARDIZEM  CD) 240 MG 24 hr capsule TAKE 1 CAPSULE BY MOUTH EVERY DAY 90 capsule 2   diltiazem  (CARDIZEM ) 30 MG tablet Take 1 tablet every 4 hours AS NEEDED for afib heart rate >100 30 tablet 1   Evolocumab  (REPATHA  SURECLICK) 140 MG/ML SOAJ Inject 140 mg into the skin every 14 (fourteen) days. 2 mL 11   ezetimibe  (ZETIA ) 10 MG tablet Take 1 tablet (10 mg total) by mouth daily. 90 tablet 0   flecainide  (TAMBOCOR ) 50 MG tablet Take 1 tablet (50 mg total) by mouth 2 (two) times daily. 180 tablet 3   oxyCODONE  (OXY IR/ROXICODONE ) 5 MG immediate release tablet Take 5 mg by mouth every 6 (six) hours as needed for moderate pain (pain score 4-6) or severe pain (pain score 7-10).     fluticasone  (FLONASE ) 50 MCG/ACT nasal spray Place 2 sprays into both nostrils daily. (Patient taking differently: Place 2 sprays into both nostrils as needed.) 16 g 6   ATIVAN 1 MG tablet Take 1 tablet to 2 tablets 90 minutes prior to MRI (Patient not taking: Reported on 12/01/2023)     benzonatate  (TESSALON ) 200 MG capsule Take 1 capsule (200 mg total) by mouth 3 (three) times daily as needed for cough. (Patient not taking: Reported on 12/01/2023) 45 capsule 1   doxycycline  (VIBRA -TABS) 100 MG tablet Take 1 tablet (100 mg total) by mouth 2 (two) times daily. (Patient not taking: Reported on 12/01/2023) 10 tablet 0   HYDROcodone -acetaminophen  (NORCO/VICODIN) 5-325 MG tablet  (Patient not taking: Reported on 12/01/2023)     methocarbamol  (ROBAXIN ) 500 MG tablet Take 500 mg by mouth every 6 (six) hours as needed for muscle spasms. (Patient not taking: Reported on 12/01/2023)     NEURONTIN 300 MG capsule Take 1 capsule as needed by oral route in the evening for 30 days. (Patient not taking: Reported on 12/01/2023)     traMADol  (ULTRAM ) 50 MG tablet  (Patient not taking: Reported on 12/01/2023)     No facility-administered medications prior to visit.    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   Social Drivers of Health   Financial Resource Strain: Low Risk  (11/30/2023)   Overall Financial Resource Strain (CARDIA)    Difficulty of Paying Living Expenses: Not hard at all  Food Insecurity: No Food Insecurity (11/30/2023)   Hunger Vital Sign    Worried About Running Out of Food in the Last Year: Never true    Ran Out of Food in the Last Year: Never true  Transportation Needs: No Transportation Needs (11/30/2023)   PRAPARE - Administrator, Civil Service (Medical): No    Lack of Transportation (Non-Medical): No  Physical Activity: Unknown (11/30/2023)   Exercise Vital Sign    Days of Exercise per Week: 0 days    Minutes of Exercise per Session: Not on file  Stress: No Stress Concern Present (11/30/2023)   [employer] of Occupational Health - Occupational Stress Questionnaire    Feeling of Stress : Not at all  Social Connections: Not on file           Objective:  Physical Exam: BP 137/70 (BP Location: Left Arm, Patient Position: Sitting, Cuff Size: Large)   Pulse 60   Temp (!) 97.4 F (36.3 C)  (Temporal)   Ht 5\' 11"  (1.803 m)   Wt 293 lb 3.2 oz (133 kg)   SpO2 97%   BMI 40.89 kg/m   Body mass index is 40.89 kg/m. Wt Readings from Last 3 Encounters:  12/01/23 293 lb 3.2 oz (133 kg)  11/06/23 289 lb (131.1 kg)  06/13/23 291 lb 3.2 oz (132.1 kg)    Physical Exam VITALS: BP- 147/79 MEASUREMENTS: BMI- 40.0. GENERAL: Alert, cooperative, well developed, no acute distress. HEENT: Normocephalic, normal oropharynx, moist mucous membranes. CHEST: Clear to auscultation bilaterally, no wheezes, rhonchi, or crackles. CARDIOVASCULAR: Normal heart rate and rhythm, S1 and S2 normal without murmurs. ABDOMEN: Soft, non-tender, non-distended, without organomegaly, normal bowel sounds. EXTREMITIES: No cyanosis or edema, feet examined with intact sensation. NEUROLOGICAL: Cranial nerves grossly intact, moves all extremities without gross motor or sensory deficit. SKIN: Dry skin with mild venous insufficiency. Red scaling papule on left cheek, no bleeding, or discharge        Prior labs:   Recent Results (from the past 2160 hours)  CBC     Status: None   Collection Time: 11/18/23  8:29 AM  Result Value Ref Range   WBC 8.5 3.4 - 10.8 x10E3/uL   RBC 5.06 4.14 - 5.80 x10E6/uL   Hemoglobin 15.5 13.0 - 17.7 g/dL   Hematocrit 40.9 81.1 - 51.0 %  MCV 95 79 - 97 fL   MCH 30.6 26.6 - 33.0 pg   MCHC 32.4 31.5 - 35.7 g/dL   RDW 16.1 09.6 - 04.5 %   Platelets 286 150 - 450 x10E3/uL    Lab Results  Component Value Date   CHOL 87 (L) 07/15/2022   CHOL 152 02/08/2022   CHOL 121 10/08/2021   Lab Results  Component Value Date   HDL 33 (L) 07/15/2022   HDL 28 (L) 02/08/2022   HDL 28 (L) 10/08/2021   Lab Results  Component Value Date   LDLCALC 25 07/15/2022   LDLCALC 85 02/08/2022   LDLCALC 63 10/08/2021   Lab Results  Component Value Date   TRIG 182 (H) 07/15/2022   TRIG 233 (H) 02/08/2022   TRIG 180 (H) 10/08/2021   Lab Results  Component Value Date   CHOLHDL 2.6  07/15/2022   CHOLHDL 5.4 (H) 02/08/2022   CHOLHDL 4.3 10/08/2021   No results found for: "LDLDIRECT"  Last metabolic panel Lab Results  Component Value Date   GLUCOSE 123 (H) 05/09/2023   NA 140 05/09/2023   K 4.2 05/09/2023   CL 103 05/09/2023   CO2 27 05/09/2023   BUN 15 05/09/2023   CREATININE 0.94 05/09/2023   EGFR 92 05/09/2023   CALCIUM  9.5 05/09/2023   PROT 7.5 04/18/2022   ALBUMIN 4.2 04/18/2022   BILITOT 1.4 (H) 04/18/2022   ALKPHOS 70 04/18/2022   AST 22 04/18/2022   ALT 27 04/18/2022   ANIONGAP 10 04/19/2022    No results found for: "HGBA1C"  Last CBC Lab Results  Component Value Date   WBC 8.5 11/18/2023   HGB 15.5 11/18/2023   HCT 47.8 11/18/2023   MCV 95 11/18/2023   MCH 30.6 11/18/2023   RDW 13.4 11/18/2023   PLT 286 11/18/2023    Lab Results  Component Value Date   TSH 3.035 Test methodology is 3rd generation TSH 05/15/2009    Lab Results  Component Value Date   PSA 1.68 06/27/2009    Last vitamin D No results found for: "25OHVITD2", "25OHVITD3", "VD25OH"  Lab Results  Component Value Date   BILIRUBINUR NEGATIVE 08/30/2014   PROTEINUR NEGATIVE 08/30/2014   UROBILINOGEN 0.2 08/30/2014   LEUKOCYTESUR NEGATIVE 08/30/2014    No results found for: "LABMICR", "MICROALBUR"   At today's visit, we discussed treatment options, associated risk and benefits, and engage in counseling as needed.  Additionally the following were reviewed: Past medical records, past medical and surgical history, family and social background, as well as relevant laboratory results, imaging findings, and specialty notes, where applicable.  This message was generated using dictation software, and as a result, it may contain unintentional typos or errors.  Nevertheless, extensive effort was made to accurately convey at the pertinent aspects of the patient visit.    There may have been are other unrelated non-urgent complaints, but due to the busy schedule and the  amount of time already spent with him, time does not permit to address these issues at today's visit. Another appointment may have or has been requested to review these additional issues.     Harle Libra, MD, MS

## 2023-12-01 NOTE — Patient Instructions (Signed)
  VISIT SUMMARY: You came in today for preoperative clearance for your upcoming right shoulder surgery. We discussed your arthritis, shoulder tear, burning sensation in your feet, concerns about diabetes, asthma, sleep apnea, and other health issues.  YOUR PLAN: -PREOPERATIVE CLEARANCE FOR RIGHT SHOULDER SURGERY: You are scheduled for right shoulder surgery due to arthritis and a massive tear in your shoulder. We need to ensure your overall health is optimal before surgery. We will conduct several lab tests, including hemoglobin A1c, metabolic panel, CBC, lipid panel, CMP, urinalysis, creatinine, PSA, and SPEP. Please make sure to fast before these tests for accurate results. We will also recheck your blood pressure to ensure it is under 140/90.  -ATRIAL FIBRILLATION: Atrial fibrillation is an irregular and often rapid heart rate. Your condition is managed with medications, and you have been cleared for surgery by your cardiologist.  -HYPERTENSION: Hypertension is high blood pressure. Your blood pressure is currently mildly high at 147/79. We will recheck it to ensure it is under 140/90 before surgery.  -POSSIBLE DIABETES MELLITUS: Diabetes is a condition where blood sugar levels are too high. You have symptoms that may suggest diabetes, such as burning in your feet and shakes when skipping meals. We will conduct lab tests to check for diabetes and related complications.  -HYPERLIPIDEMIA: Hyperlipidemia is high cholesterol. You are on medications for this, but we need to check your cholesterol levels with a fasting lipid panel.  -OBSTRUCTIVE SLEEP APNEA: Obstructive sleep apnea is a condition where breathing stops and starts during sleep. You use a CPAP machine but have had difficulty obtaining new supplies. You have a follow-up scheduled with pulmonology.  -ASTHMA: Asthma is a condition where your airways narrow and swell, making it hard to breathe. Your asthma is mild and well-controlled with  occasional use of albuterol .  -VENOUS STASIS DERMATITIS: Venous stasis dermatitis is a skin condition caused by poor blood flow. You have dry skin on your legs, likely due to mild venous insufficiency. Apply lotion or hydrocortisone as needed.  INSTRUCTIONS: Please schedule your lab work for fasting blood tests and follow up with the infectious disease specialist as scheduled. Also, contact Dr. Del Favia for evaluation of the skin lesion on your left cheek.

## 2023-12-02 ENCOUNTER — Other Ambulatory Visit (INDEPENDENT_AMBULATORY_CARE_PROVIDER_SITE_OTHER)

## 2023-12-02 DIAGNOSIS — I480 Paroxysmal atrial fibrillation: Secondary | ICD-10-CM | POA: Diagnosis not present

## 2023-12-02 DIAGNOSIS — Z0000 Encounter for general adult medical examination without abnormal findings: Secondary | ICD-10-CM | POA: Diagnosis not present

## 2023-12-02 DIAGNOSIS — G6289 Other specified polyneuropathies: Secondary | ICD-10-CM

## 2023-12-02 DIAGNOSIS — I700 Atherosclerosis of aorta: Secondary | ICD-10-CM | POA: Diagnosis not present

## 2023-12-02 DIAGNOSIS — Z01818 Encounter for other preprocedural examination: Secondary | ICD-10-CM | POA: Diagnosis not present

## 2023-12-02 DIAGNOSIS — Z125 Encounter for screening for malignant neoplasm of prostate: Secondary | ICD-10-CM | POA: Diagnosis not present

## 2023-12-02 DIAGNOSIS — R350 Frequency of micturition: Secondary | ICD-10-CM | POA: Diagnosis not present

## 2023-12-04 LAB — PROTEIN ELECTROPHORESIS, URINE REFLEX
Albumin ELP, Urine: 48 %
Alpha-1-Globulin, U: 2.4 %
Alpha-2-Globulin, U: 10.6 %
Beta Globulin, U: 19.9 %
Gamma Globulin, U: 19.1 %
Protein, Ur: 20.7 mg/dL

## 2023-12-04 LAB — PROTEIN ELECTROPHORESIS, SERUM
Albumin ELP: 4 g/dL (ref 3.8–4.8)
Alpha 1: 0.3 g/dL (ref 0.2–0.3)
Alpha 2: 0.9 g/dL (ref 0.5–0.9)
Beta 2: 0.5 g/dL (ref 0.2–0.5)
Beta Globulin: 0.5 g/dL (ref 0.4–0.6)
Gamma Globulin: 1.5 g/dL (ref 0.8–1.7)
Total Protein: 7.7 g/dL (ref 6.1–8.1)

## 2023-12-04 LAB — URINALYSIS W MICROSCOPIC + REFLEX CULTURE
Bacteria, UA: NONE SEEN /HPF
Bilirubin Urine: NEGATIVE
Glucose, UA: NEGATIVE
Hgb urine dipstick: NEGATIVE
Hyaline Cast: NONE SEEN /LPF
Ketones, ur: NEGATIVE
Leukocyte Esterase: NEGATIVE
Nitrites, Initial: NEGATIVE
Protein, ur: NEGATIVE
RBC / HPF: NONE SEEN /HPF (ref 0–2)
Specific Gravity, Urine: 1.014 (ref 1.001–1.035)
Squamous Epithelial / HPF: NONE SEEN /HPF (ref <=5)
WBC, UA: NONE SEEN /HPF (ref 0–5)
pH: 5.5 (ref 5.0–8.0)

## 2023-12-04 LAB — NO CULTURE INDICATED

## 2023-12-04 NOTE — Telephone Encounter (Signed)
 Pre-op  Clearance form received.   CLINICAL USE BELOW THIS LINE (use X to signify action taken)  _X__ Form received and placed in providers office for signature. ___ Form completed and faxed to LOA Dept.  ___ Form completed & LVM to notify patient ready for pick up.  ___ Charge sheet and copy of form in front office folder for office supervisor.

## 2023-12-08 ENCOUNTER — Ambulatory Visit: Payer: Self-pay | Admitting: Family Medicine

## 2023-12-08 DIAGNOSIS — E538 Deficiency of other specified B group vitamins: Secondary | ICD-10-CM

## 2023-12-08 DIAGNOSIS — E1129 Type 2 diabetes mellitus with other diabetic kidney complication: Secondary | ICD-10-CM | POA: Insufficient documentation

## 2023-12-08 MED ORDER — VITAMIN B-12 1000 MCG PO TABS: 1000.0000 ug | ORAL_TABLET | Freq: Every day | ORAL | 3 refills | Status: AC

## 2023-12-09 NOTE — Telephone Encounter (Signed)
 Forms faxed

## 2023-12-15 ENCOUNTER — Ambulatory Visit (INDEPENDENT_AMBULATORY_CARE_PROVIDER_SITE_OTHER): Admitting: Family Medicine

## 2023-12-15 ENCOUNTER — Encounter: Payer: Self-pay | Admitting: Family Medicine

## 2023-12-15 ENCOUNTER — Telehealth: Payer: Self-pay

## 2023-12-15 VITALS — BP 118/70 | HR 71 | Temp 97.9°F | Ht 71.0 in | Wt 290.4 lb

## 2023-12-15 DIAGNOSIS — E782 Mixed hyperlipidemia: Secondary | ICD-10-CM

## 2023-12-15 DIAGNOSIS — I6350 Cerebral infarction due to unspecified occlusion or stenosis of unspecified cerebral artery: Secondary | ICD-10-CM | POA: Diagnosis not present

## 2023-12-15 DIAGNOSIS — R809 Proteinuria, unspecified: Secondary | ICD-10-CM | POA: Diagnosis not present

## 2023-12-15 DIAGNOSIS — E1129 Type 2 diabetes mellitus with other diabetic kidney complication: Secondary | ICD-10-CM | POA: Diagnosis not present

## 2023-12-15 DIAGNOSIS — Z7985 Long-term (current) use of injectable non-insulin antidiabetic drugs: Secondary | ICD-10-CM | POA: Diagnosis not present

## 2023-12-15 DIAGNOSIS — E538 Deficiency of other specified B group vitamins: Secondary | ICD-10-CM | POA: Diagnosis not present

## 2023-12-15 DIAGNOSIS — I10 Essential (primary) hypertension: Secondary | ICD-10-CM

## 2023-12-15 DIAGNOSIS — I700 Atherosclerosis of aorta: Secondary | ICD-10-CM | POA: Diagnosis not present

## 2023-12-15 DIAGNOSIS — I480 Paroxysmal atrial fibrillation: Secondary | ICD-10-CM

## 2023-12-15 DIAGNOSIS — G4733 Obstructive sleep apnea (adult) (pediatric): Secondary | ICD-10-CM

## 2023-12-15 DIAGNOSIS — G63 Polyneuropathy in diseases classified elsewhere: Secondary | ICD-10-CM

## 2023-12-15 MED ORDER — OZEMPIC (0.25 OR 0.5 MG/DOSE) 2 MG/3ML ~~LOC~~ SOPN: 0.2500 mg | PEN_INJECTOR | SUBCUTANEOUS | 1 refills | Status: DC

## 2023-12-15 NOTE — Patient Instructions (Signed)
  VISIT SUMMARY: During your visit, we reviewed your recent lab results which indicated diabetes and low B12 levels. We discussed your current symptoms, including numbness and difficulty controlling your diet, and reviewed your current medications. We also talked about potential new treatments and lifestyle changes to help manage your conditions.  YOUR PLAN: -TYPE 2 DIABETES MELLITUS: You have been diagnosed with Type 2 Diabetes, which means your blood sugar levels are higher than normal. We discussed starting a medication like semaglutide (Ozempic) or tirzepatide (Mounjaro) to help manage your blood sugar, protect your heart and kidneys, and assist with weight loss. You will start at a low dose to minimize side effects, and we will gradually increase it. You will also see a nutritionist to help with your diet.  -OBESITY: Your BMI is 40, which classifies as obesity and increases your risk for heart disease. We will use the same medication for diabetes to help with weight loss and encourage you to make lifestyle changes, including diet and exercise.  -PROTEINURIA: You have a small amount of protein in your urine, which can be related to diabetes or other conditions like high blood pressure. We will monitor this as part of your diabetes management.  -VITAMIN B12 DEFICIENCY: Your B12 levels are low, which can cause symptoms like numbness. You have started taking a B12 supplement, and we recommend continuing it. If you experience side effects like headaches or nausea, you can try taking it every other day.  -HYPERLIPIDEMIA: You are currently managing your cholesterol with Repatha  and Zetia . Continue with these medications as prescribed.  -GENERAL HEALTH MAINTENANCE: We discussed the importance of regular physical activity and a balanced diet to manage your overall health and specific conditions like diabetes and obesity.  INSTRUCTIONS: Please follow up in one month to assess how you are tolerating the  new medication and its effectiveness. Continue with your current medications and lifestyle changes, and see a nutritionist for dietary counseling.

## 2023-12-15 NOTE — Progress Notes (Signed)
 / Assessment & Plan   Assessment/Plan:    Assessment & Plan Type 2 Diabetes Mellitus New diagnosis with hemoglobin A1c of 6.9%, indicating diabetes but currently under the management goal of 7%. Emphasis on lifestyle modifications such as a lower carbohydrate diet and exercise. Discussed potential benefits of GLP-1 receptor agonists like semaglutide (Ozempic) and tirzepatide Cloyde) for cardiovascular and renal protection, weight loss, and diabetes management. Shared decision-making regarding initiation of these medications, considering BMI of 40 and potential cardiovascular risks. Emphasized starting at a low dose to minimize side effects such as nausea and gradually increasing the dose. - Refer to nutritionist for dietary counseling focusing on carbohydrate management. - Initiate semaglutide (Ozempic) or tirzepatide (Mounjaro) at a low dose, titrating slowly to minimize side effects. - Schedule follow-up in one month to assess tolerance and effectiveness of medication.  Obesity BMI of 40, increasing cardiovascular risk. Discussed role of GLP-1 receptor agonists in weight management and additional benefits in reducing cardiovascular and renal risks. Emphasized importance of slow and steady adjustments to medication to minimize side effects and improve adherence. - Initiate GLP-1 receptor agonist therapy as part of diabetes management plan. - Encourage lifestyle modifications including diet and exercise.  Proteinuria Mild proteinuria detected, associated with diabetes, hypertension, or heart disease. Requires monitoring as part of diabetes management.  Vitamin B12 Deficiency Low B12 levels potentially contributing to numbness. Self-initiated B12 supplementation with improvement in energy levels but experienced headache and nausea, possibly related to the supplement or dietary intake. Advised to continue B12 supplementation with adjustments to dosing frequency if side effects persist.  Discussed dietary sources and option of injections if oral supplementation causes issues. - Continue B12 supplementation, consider adjusting to every other day if side effects occur.  Hyperlipidemia On Repatha  and Zetia  for cholesterol management.  General Health Maintenance Emphasized lifestyle modifications such as diet and exercise in managing overall health and specific conditions like diabetes and obesity. - Encourage regular physical activity and a balanced diet.      There are no discontinued medications.  Return in about 1 month (around 01/14/2024) for DM.        Subjective:   Encounter date: 12/15/2023  Ethan Lambert is a 63 y.o. male who has HIV infection (HCC); Essential hypertension; Atrial fibrillation (HCC); Cerebral artery occlusion with cerebral infarction (HCC); VARICOCELE; HEMATOSPERMIA; INGUINAL HERNIORRHAPHIES, BILATERAL, HX OF; S/P right knee arthroscopy; Fatigue; HNP (herniated nucleus pulposus), lumbar; Encounter for long-term (current) use of medications; Shoulder pain, right; Acromioclavicular joint arthritis; Sprain of shoulder, right; Medication monitoring encounter; Subacromial bursitis of left shoulder joint; Paroxysmal atrial fibrillation (HCC); Rotator cuff syndrome of left shoulder; Acute medial meniscus tear, left, subsequent encounter; Morbid obesity (HCC); Bronchitis; Hyperlipidemia; Aortic atherosclerosis (HCC); Encounter for screening for infections with predominantly sexual mode of transmission; Nonrheumatic aortic valve insufficiency; Insomnia; Paroxysmal atrial fibrillation with RVR (HCC); Atrial fibrillation with RVR (HCC); Asthma; Loud snoring; Chronic pain of right knee; OSA on CPAP; Urinary frequency; Peripheral neuropathy; Class 3 severe obesity due to excess calories with serious comorbidity and body mass index (BMI) of 40.0 to 44.9 in adult; Type 2 diabetes mellitus with proteinuria (HCC); and Vitamin B12 deficiency on their problem list..    He  has a past medical history of Anemia, Arthritis, Childhood asthma, Clotting disorder (HCC), CVA (cerebral vascular accident) (HCC) (1999 X 2), Family history of malignant neoplasm of gastrointestinal tract, Heart murmur, History of blood transfusion (1999), History of kidney stones, HIV disease (HCC) (dx 1999), Hypertension, Paroxysmal atrial fibrillation (HCC), Right  knee meniscal tear, Seizures (HCC), Skin cancer, and Sleep apnea.SABRA   He presents with chief complaint of Consult (Discuss lab work) .   Discussed the use of AI scribe software for clinical note transcription with the patient, who gave verbal consent to proceed.  History of Present Illness Ethan Lambert is a 63 year old male who presents for follow-up regarding recent lab results indicating diabetes and low B12 levels.  Recent lab results revealed diabetes with a hemoglobin A1c of 6.9%. No medication has been initiated yet. The lab results also showed a small amount of protein in his urine. He experiences numbness.  He has started taking a B12 supplement, 500 mg chewable, once a day since last Tuesday. He noticed increased energy by the second day, allowing him to perform more physical activities like washing his truck and cutting hedges. However, he experienced a dull headache and nausea, which led him to stop the supplement over the weekend. He plans to resume taking it every other day to see if symptoms recur.  He mentions difficulty in controlling his diet, particularly with portion sizes, and notes weight gain, which he feels is affecting his stomach. He has a BMI of 40.  He is currently on Repatha , an injection for cholesterol, administered once every two weeks, and takes Zetia  orally each night. He is familiar with the injection process and has no issues with it.     ROS  Past Surgical History:  Procedure Laterality Date   APPENDECTOMY  06-09-2003   ATRIAL FIBRILLATION ABLATION  05/28/2018   ATRIAL  FIBRILLATION ABLATION N/A 05/28/2018   Procedure: ATRIAL FIBRILLATION ABLATION;  Surgeon: Kelsie Agent, MD;  Location: [facility];  Service: Cardiovascular;  Laterality: N/A;   BACK SURGERY     INGUINAL HERNIA REPAIR Bilateral 1990s   KNEE ARTHROSCOPY WITH MEDIAL MENISECTOMY Right 12/31/2013   Procedure: RIGHT KNEE ARTHROSCOPY PARTIAL MEDIAL MENISCECTOMY DEBRIDEMENT AND CHONDROPLASTY ;  Surgeon: Lamar Collet, MD;  Location: [facility];  Service: Orthopedics;  Laterality: Right;   KNEE ARTHROSCOPY WITH MEDIAL MENISECTOMY Left 01/27/2019   Procedure: LEFT KNEE ARTHROSCOPY WITH PARTIAL MEDIAL MENISCECTOMY;  Surgeon: Jerri Kay HERO, MD;  Location: [facility];  Service: Orthopedics;  Laterality: Left;   LAPAROSCOPIC CHOLECYSTECTOMY  07-08-2003   LASER ABLATION ANAL CONDYLOMA  05-18-2001   LUMBAR LAMINECTOMY/DECOMPRESSION MICRODISCECTOMY Left 03/15/2015   Procedure: Left Lumbar five- sacral one microdiskectomy;  Surgeon: Gerldine Maizes, MD;  Location: [facility];  Service: Neurosurgery;  Laterality: Left;  Left L5S1 microdiskectomy   NEGATIVE SLEEP STUDY  01-03-2012  in epic   SKIN CANCER EXCISION Right    cut it off my shoulder   TRANSTHORACIC ECHOCARDIOGRAM  12-01-2008   MODERATE LVH/  EF 55-60%/  MILD MR/  NEGATIVE BUBBLE STUDY    Outpatient Medications Prior to Visit  Medication Sig Dispense Refill   abacavir -dolutegravir -lamiVUDine  (TRIUMEQ ) 600-50-300 MG tablet Take 1 tablet by mouth daily. 30 tablet 11   albuterol  (VENTOLIN  HFA) 108 (90 Base) MCG/ACT inhaler TAKE 2 PUFFS BY MOUTH EVERY 6 HOURS AS NEEDED FOR WHEEZE OR SHORTNESS OF BREATH 8.5 each 0   apixaban  (ELIQUIS ) 5 MG TABS tablet Take 1 tablet (5 mg total) by mouth 2 (two) times daily. 180 tablet 1   cyanocobalamin  (VITAMIN B12) 1000 MCG tablet Take 1 tablet (1,000 mcg total) by mouth daily. 90 tablet 3   diltiazem  (CARDIZEM  CD) 240 MG 24 hr capsule TAKE 1 CAPSULE BY MOUTH EVERY DAY 90 capsule  2   diltiazem  (CARDIZEM ) 30 MG tablet Take 1 tablet every 4 hours AS NEEDED for afib heart rate >100 30 tablet 1   Evolocumab  (REPATHA  SURECLICK) 140 MG/ML SOAJ Inject 140 mg into the skin every 14 (fourteen) days. 2 mL 11   ezetimibe  (ZETIA ) 10 MG tablet Take 1 tablet (10 mg total) by mouth daily. 90 tablet 0   flecainide  (TAMBOCOR ) 50 MG tablet Take 1 tablet (50 mg total) by mouth 2 (two) times daily. 180 tablet 3   oxyCODONE  (OXY IR/ROXICODONE ) 5 MG immediate release tablet Take 5 mg by mouth every 6 (six) hours as needed for moderate pain (pain score 4-6) or severe pain (pain score 7-10).     No facility-administered medications prior to visit.    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   Social Drivers of Health   Financial Resource Strain: Low Risk  (12/14/2023)    Overall Financial Resource Strain (CARDIA)    Difficulty of Paying Living Expenses: Not hard at all  Food Insecurity: No Food Insecurity (12/14/2023)   Hunger Vital Sign    Worried About Running Out of Food in the Last Year: Never true    Ran Out of Food in the Last Year: Never true  Transportation Needs: No Transportation Needs (12/14/2023)   PRAPARE - Administrator, Civil Service (Medical): No    Lack of Transportation (Non-Medical): No  Physical Activity: Inactive (12/14/2023)   Exercise Vital Sign    Days of Exercise per Week: 0 days    Minutes of Exercise per Session: Not on file  Stress: No Stress Concern Present (12/14/2023)   [employer] of Occupational Health - Occupational Stress Questionnaire    Feeling of Stress: Not at all  Social Connections: Moderately Integrated (12/14/2023)   Social Connection and Isolation Panel    Frequency of Communication with Friends and Family: More than three times a week    Frequency of Social Gatherings with Friends and Family: Once a week    Attends Religious Services: Never    Database administrator or  Organizations: Yes    Attends Banker Meetings: 1 to 4 times per year    Marital Status: Married  Catering manager Violence: Not At Risk (04/18/2022)   Humiliation, Afraid, Rape, and Kick questionnaire    Fear of Current or Ex-Partner: No    Emotionally Abused: No    Physically Abused: No    Sexually Abused: No                                                                                                  Objective:  Physical Exam: BP 118/70   Pulse 71   Temp 97.9 F (36.6 C) (Temporal)   Ht 5' 11 (1.803 m)   Wt 290 lb 6.4 oz (131.7 kg)   SpO2 97%   BMI 40.50 kg/m    Physical Exam GENERAL: Alert, cooperative, well developed, no acute distress HEENT: Normocephalic, normal oropharynx, moist mucous membranes CHEST: Clear to auscultation bilaterally, No wheezes, rhonchi, or crackles CARDIOVASCULAR:  Normal heart rate and rhythm, S1 and S2 normal without murmurs ABDOMEN: Soft, non-tender, non-distended, without organomegaly, Normal bowel sounds EXTREMITIES: No cyanosis or edema NEUROLOGICAL: Cranial nerves grossly intact, Moves all extremities without gross motor or sensory deficit   Physical Exam  No results found.  Recent Results (from the past 2160 hours)  CBC     Status: None   Collection Time: 11/18/23  8:29 AM  Result Value Ref Range   WBC 8.5 3.4 - 10.8 x10E3/uL   RBC 5.06 4.14 - 5.80 x10E6/uL   Hemoglobin 15.5 13.0 - 17.7 g/dL   Hematocrit 52.1 62.4 - 51.0 %   MCV 95 79 - 97 fL   MCH 30.6 26.6 - 33.0 pg   MCHC 32.4 31.5 - 35.7 g/dL   RDW 86.5 88.3 - 84.5 %   Platelets 286 150 - 450 x10E3/uL  Protein Electrophoresis, Urine Rflx.     Status: None   Collection Time: 12/02/23  8:53 AM  Result Value Ref Range   Protein, Ur 20.7 Not Estab. mg/dL   Albumin ELP, Urine 51.9 %   Alpha-1-Globulin, U 2.4 %   Alpha-2-Globulin, U 10.6 %   Beta Globulin, U 19.9 %   Gamma Globulin, U 19.1 %   M Component, Ur Not Observed Not Observed %   Please Note: Comment     Comment: Protein electrophoresis scan will follow via computer, mail, or courier delivery.   Microalbumin / creatinine urine ratio     Status: Abnormal   Collection Time: 12/02/23  8:53 AM  Result Value Ref Range   Microalb, Ur 2.9 (H) 0.0 - 1.9 mg/dL   Creatinine,U 34.0 mg/dL   Microalb Creat Ratio 43.8 (H) 0.0 - 30.0 mg/g  Protein Electrophoresis, (serum)     Status: None   Collection Time: 12/02/23  8:53 AM  Result Value Ref Range   Total Protein 7.7 6.1 - 8.1 g/dL   Albumin ELP 4.0 3.8 - 4.8 g/dL   Alpha 1 0.3 0.2 - 0.3 g/dL   Alpha 2 0.9 0.5 - 0.9 g/dL   Beta Globulin 0.5 0.4 - 0.6  g/dL   Beta 2 0.5 0.2 - 0.5 g/dL   Gamma Globulin 1.5 0.8 - 1.7 g/dL   SPE Interp.      Comment: Normal Serum Protein Electrophoresis Pattern. No abnormal protein bands (M-protein) detected.   B12     Status: Abnormal    Collection Time: 12/02/23  8:53 AM  Result Value Ref Range   Vitamin B-12 176 (L) 211 - 911 pg/mL  Magnesium      Status: None   Collection Time: 12/02/23  8:53 AM  Result Value Ref Range   Magnesium  2.0 1.5 - 2.5 mg/dL  PSA     Status: None   Collection Time: 12/02/23  8:53 AM  Result Value Ref Range   PSA 0.49 0.10 - 4.00 ng/mL    Comment: Test performed using Access Hybritech PSA Assay, a parmagnetic partical, chemiluminecent immunoassay.  Urinalysis w microscopic + reflex cultur     Status: Abnormal   Collection Time: 12/02/23  8:53 AM   Specimen: Urine  Result Value Ref Range   Color, Urine YELLOW YELLOW   APPearance CLOUDY (A) CLEAR   Specific Gravity, Urine 1.014 1.001 - 1.035   pH 5.5 5.0 - 8.0   Glucose, UA NEGATIVE NEGATIVE   Bilirubin Urine NEGATIVE NEGATIVE   Ketones, ur NEGATIVE NEGATIVE   Hgb urine dipstick NEGATIVE NEGATIVE   Protein, ur NEGATIVE NEGATIVE   Nitrites, Initial NEGATIVE NEGATIVE   Leukocyte Esterase NEGATIVE NEGATIVE   WBC, UA NONE SEEN 0 - 5 /HPF   RBC / HPF NONE SEEN 0 - 2 /HPF   Squamous Epithelial / HPF NONE SEEN < OR = 5 /HPF   Bacteria, UA NONE SEEN NONE SEEN /HPF   Hyaline Cast NONE SEEN NONE SEEN /LPF  TSH     Status: None   Collection Time: 12/02/23  8:53 AM  Result Value Ref Range   TSH 2.44 0.35 - 5.50 uIU/mL  Lipid panel     Status: Abnormal   Collection Time: 12/02/23  8:53 AM  Result Value Ref Range   Cholesterol 76 0 - 200 mg/dL    Comment: ATP III Classification       Desirable:  < 200 mg/dL               Borderline High:  200 - 239 mg/dL          High:  > = 759 mg/dL   Triglycerides 838.9 (H) 0.0 - 149.0 mg/dL    Comment: Normal:  <849 mg/dLBorderline High:  150 - 199 mg/dL   HDL 66.99 (L) >60.99 mg/dL   VLDL 67.7 0.0 - 59.9 mg/dL   LDL Cholesterol 10 0 - 99 mg/dL   Total CHOL/HDL Ratio 2     Comment:                Men          Women1/2 Average Risk     3.4          3.3Average Risk          5.0          4.42X Average Risk           9.6          7.13X Average Risk          15.0          11.0  NonHDL 42.50     Comment: NOTE:  Non-HDL goal should be 30 mg/dL higher than patient's LDL goal (i.e. LDL goal of < 70 mg/dL, would have non-HDL goal of < 100 mg/dL)  Hemoglobin J8r     Status: Abnormal   Collection Time: 12/02/23  8:53 AM  Result Value Ref Range   Hgb A1c MFr Bld 6.9 (H) 4.6 - 6.5 %    Comment: Glycemic Control Guidelines for People with Diabetes:Non Diabetic:  <6%Goal of Therapy: <7%Additional Action Suggested:  >8%   Comprehensive metabolic panel with GFR     Status: Abnormal   Collection Time: 12/02/23  8:53 AM  Result Value Ref Range   Sodium 138 135 - 145 mEq/L   Potassium 4.3 3.5 - 5.1 mEq/L   Chloride 104 96 - 112 mEq/L   CO2 27 19 - 32 mEq/L   Glucose, Bld 120 (H) 70 - 99 mg/dL   BUN 13 6 - 23 mg/dL   Creatinine, Ser 9.13 0.40 - 1.50 mg/dL   Total Bilirubin 0.8 0.2 - 1.2 mg/dL   Alkaline Phosphatase 66 39 - 117 U/L   AST 32 0 - 37 U/L   ALT 35 0 - 53 U/L   Total Protein 7.6 6.0 - 8.3 g/dL   Albumin 4.4 3.5 - 5.2 g/dL   GFR 07.46 >39.99 mL/min    Comment: Calculated using the CKD-EPI Creatinine Equation (2021)   Calcium  9.1 8.4 - 10.5 mg/dL  REFLEXIVE URINE CULTURE     Status: None   Collection Time: 12/02/23  8:53 AM  Result Value Ref Range   Reflexve Urine Culture      Comment: NO CULTURE INDICATED        Beverley Adine Hummer, MD, MS

## 2023-12-15 NOTE — Telephone Encounter (Signed)
 Pharmacy Patient Advocate Encounter   Received notification from CoverMyMeds that prior authorization for Ozempic (0.25 or 0.5 MG/DOSE) 2MG /3ML pen-injectors is required/requested.   Insurance verification completed.   The patient is insured through [facility] .   Per test claim: PA required; PA submitted to above mentioned insurance via CoverMyMeds Key/confirmation #/EOC [id number] Status is pending

## 2023-12-28 ENCOUNTER — Other Ambulatory Visit: Payer: Self-pay | Admitting: Internal Medicine

## 2023-12-29 ENCOUNTER — Telehealth: Payer: Self-pay | Admitting: Internal Medicine

## 2023-12-29 DIAGNOSIS — H5203 Hypermetropia, bilateral: Secondary | ICD-10-CM | POA: Diagnosis not present

## 2023-12-29 DIAGNOSIS — H2513 Age-related nuclear cataract, bilateral: Secondary | ICD-10-CM | POA: Diagnosis not present

## 2023-12-29 DIAGNOSIS — H52223 Regular astigmatism, bilateral: Secondary | ICD-10-CM | POA: Diagnosis not present

## 2023-12-29 DIAGNOSIS — H524 Presbyopia: Secondary | ICD-10-CM | POA: Diagnosis not present

## 2023-12-29 DIAGNOSIS — H35033 Hypertensive retinopathy, bilateral: Secondary | ICD-10-CM | POA: Diagnosis not present

## 2023-12-29 MED ORDER — EZETIMIBE 10 MG PO TABS: 10.0000 mg | ORAL_TABLET | Freq: Every day | ORAL | 0 refills | Status: DC

## 2023-12-29 NOTE — Telephone Encounter (Signed)
 Pt's medication was sent to pt's pharmacy as requested. Confirmation received.

## 2023-12-29 NOTE — Telephone Encounter (Signed)
*  STAT* If patient is at the pharmacy, call can be transferred to refill team.   1. Which medications need to be refilled? (please list name of each medication and dose if known)   ezetimibe  (ZETIA ) 10 MG tablet    2. Which pharmacy/location (including street and city if local pharmacy) is medication to be sent to?  [id number] [place], [place] - [address]     3. Do they need a 30 day or 90 day supply? 90 day    Pt is out of medication

## 2024-01-05 ENCOUNTER — Ambulatory Visit: Attending: Internal Medicine | Admitting: Internal Medicine

## 2024-01-05 VITALS — BP 131/84 | HR 57 | Ht 71.0 in | Wt 285.0 lb

## 2024-01-05 DIAGNOSIS — I700 Atherosclerosis of aorta: Secondary | ICD-10-CM

## 2024-01-05 DIAGNOSIS — G4733 Obstructive sleep apnea (adult) (pediatric): Secondary | ICD-10-CM

## 2024-01-05 DIAGNOSIS — I480 Paroxysmal atrial fibrillation: Secondary | ICD-10-CM

## 2024-01-05 DIAGNOSIS — I351 Nonrheumatic aortic (valve) insufficiency: Secondary | ICD-10-CM | POA: Diagnosis not present

## 2024-01-05 NOTE — Patient Instructions (Signed)
 Medication Instructions:  Your physician has recommended you make the following change in your medication:  STOP: ezetimibe  (Zetia ) 10 mg by mouth once daily   *If you need a refill on your cardiac medications before your next appointment, please call your pharmacy*  Lab Work: NONE  If you have labs (blood work) drawn today and your tests are completely normal, you will receive your results only by: MyChart Message (if you have MyChart) OR A paper copy in the mail If you have any lab test that is abnormal or we need to change your treatment, we will call you to review the results.  Testing/Procedures: Late JUNE-JULY 2026- - - Your physician has requested that you have an echocardiogram. Echocardiography is a painless test that uses sound waves to create images of your heart. It provides your doctor with information about the size and shape of your heart and how well your heart's chambers and valves are working. This procedure takes approximately one hour. There are no restrictions for this procedure. Please do NOT wear cologne, perfume, aftershave, or lotions (deodorant is allowed). Please arrive 15 minutes prior to your appointment time.  Please note: We ask at that you not bring children with you during ultrasound (echo/ vascular) testing. Due to room size and safety concerns, children are not allowed in the ultrasound rooms during exams. Our front office staff cannot provide observation of children in our lobby area while testing is being conducted. An adult accompanying a patient to their appointment will only be allowed in the ultrasound room at the discretion of the ultrasound technician under special circumstances. We apologize for any inconvenience.   Follow-Up: At [facility], you and your health needs are our priority.  As part of our continuing mission to provide you with exceptional heart care, our providers are all part of one team.  This team includes your primary  Cardiologist (physician) and Advanced Practice Providers or APPs (Physician Assistants and Nurse Practitioners) who all work together to provide you with the care you need, when you need it.  Your next appointment:   2 year(s)  Provider:   Stanly DELENA Leavens, MD

## 2024-01-05 NOTE — Progress Notes (Signed)
 Cardiology Office Note:  .    Date:  01/05/2024  ID:  Ethan Lambert, DOB Sep 24, 1960, MRN [id number] PCP: Sebastian Beverley NOVAK, MD  [facility] Providers Cardiologist:  Stanly DELENA Leavens, MD Electrophysiologist:  Eulas FORBES Furbish, MD     CC: CAD and prevention follow up.  History of Present Illness: .    Ethan Lambert is a 63 y.o. male with atrial fibrillation and hypertension who presents for secondary follow-up.  He has a history of atrial fibrillation, initially paroxysmal and now persistent, managed with flecainide  and Eliquis . Recently, his electrophysiologist reduced his flecainide  dose from 100 mg to 50 mg due to a right bundle branch block and heart rate considerations. No chest pain, shortness of breath, or palpitations.  He has nonobstructive coronary artery disease and underwent shoulder surgery without complications. He manages his cholesterol with Repatha  and has discontinued Zetia . He experienced statin myopathy with rosuvastatin , which caused leg pain.  He has obstructive sleep apnea managed with CPAP and is actively working on weight reduction. He has lost weight, decreasing from 295 pounds to 281 pounds, and attributes some of his increased energy to addressing low B12 levels. Improved energy levels allow him to engage in activities such as mowing the grass and washing the car, although he still experiences some knee discomfort.  He has hypertension and takes diltiazem . He monitors his blood pressure regularly and notes fluctuations depending on dietary intake. He is also on Triumeq  for heart therapy with no changes in this medication.  He has a history of aortic atherosclerosis and mild aortic regurgitation, with increased calcium  on his aortic valve.  Discussed the use of AI scribe software for clinical note transcription with the patient, who gave verbal consent to proceed.   Relevant histories: .  Social   ROS: As per HPI.   Studies Reviewed: .      Cardiac Studies & Procedures   ______________________________________________________________________________________________   STRESS TESTS  MYOCARDIAL PERFUSION IMAGING 08/28/2020  Interpretation Summary  Nuclear stress EF: 60%.  No T wave inversion was noted during stress.  There was no ST segment deviation noted during stress.  Defect 1: There is a medium defect of moderate severity present in the apical septal, apical lateral and apex location.  This is a low risk study.  Medium size, moderate intensity fixed apical, apical septal and apical lateral perfusion defect, suspicious for artifact. No significant reversible ischemia. LVEF 60% with normal wall motion. This is a low risk study. No prior for comparison.   ECHOCARDIOGRAM  ECHOCARDIOGRAM COMPLETE 11/30/2021  Narrative ECHOCARDIOGRAM REPORT    Patient Name:   Ethan Lambert Date of Exam: 11/30/2021 Medical Rec #:  [id number]        Height:       73.0 in Accession #:    [id number]       Weight:       285.0 lb Date of Birth:  [birth year]         BSA:          2.501 m Patient Age:    60 years         BP:           138/68 mmHg Patient Gender: M                HR:           64 bpm. Exam Location:  [address]  Procedure: 2D Echo, 3D Echo, Cardiac Doppler, Color Doppler and Strain  Analysis  MODIFIED REPORT: This report was modified by Soyla Merck MD on 11/30/2021 due to no revision made. Indications:     I35.1 Aortic Insufficiency  History:         Patient has prior history of Echocardiogram examinations, most recent 09/14/2020. Stroke, Aortic Valve Disease, Arrythmias:Atrial Fibrillation, Signs/Symptoms:Murmur; Risk Factors:Sleep Apnea, Hypertension and Family History of Coronary Artery Disease. Aortic Insuficiency, HIV.  Sonographer:     Heather Hawks RDCS Referring Phys:  STANLY DELENA LEAVENS Diagnosing Phys: Soyla Merck MD  IMPRESSIONS   1. Left ventricular ejection fraction, by estimation,  is 65 to 70%. The left ventricle has normal function. The left ventricle has no regional wall motion abnormalities. There is severe asymmetric left ventricular hypertrophy of the septal segment (16 mm). Left ventricular diastolic parameters were normal. The average left ventricular global longitudinal strain is -22.8 %. The global longitudinal strain is normal. 2. Right ventricular systolic function is normal. The right ventricular size is mildly enlarged. There is normal pulmonary artery systolic pressure. The estimated right ventricular systolic pressure is 24.9 mmHg. 3. The mitral valve is grossly normal. Mild mitral valve regurgitation. No evidence of mitral stenosis. 4. The aortic valve is abnormal. Unable to determine aortic valve morphology, indeterminate number of cusps. There is severe calcifcation of the aortic valve primarily along noncoronary cusp. Aortic valve regurgitation is mild. Aortic valve sclerosis/calcification is present, without any evidence of aortic stenosis. 5. Aortic dilatation noted. There is borderline dilatation of the aortic root, measuring 37 mm. 6. The inferior vena cava is normal in size with greater than 50% respiratory variability, suggesting right atrial pressure of 3 mmHg. 7. Cannot exclude a small PFO with left to right shunt. Rightward bowing of atrial septum. Consider agitated saline exam if clinically indicated.  FINDINGS Left Ventricle: Left ventricular ejection fraction, by estimation, is 65 to 70%. The left ventricle has normal function. The left ventricle has no regional wall motion abnormalities. The average left ventricular global longitudinal strain is -22.8 %. The global longitudinal strain is normal. 3D left ventricular ejection fraction analysis performed but not reported based on interpreter judgement due to suboptimal tracking. The left ventricular internal cavity size was normal in size. There is severe asymmetric left ventricular hypertrophy of the  septal segment. Left ventricular diastolic parameters were normal.  Right Ventricle: Prominent moderate band. The right ventricular size is mildly enlarged. No increase in right ventricular wall thickness. Right ventricular systolic function is normal. There is normal pulmonary artery systolic pressure. The tricuspid regurgitant velocity is 2.34 m/s, and with an assumed right atrial pressure of 3 mmHg, the estimated right ventricular systolic pressure is 24.9 mmHg.  Left Atrium: Left atrial size was normal in size.  Right Atrium: Right atrial size was normal in size.  Pericardium: There is no evidence of pericardial effusion.  Mitral Valve: The mitral valve is grossly normal. Mild mitral valve regurgitation. No evidence of mitral valve stenosis.  Tricuspid Valve: The tricuspid valve is normal in structure. Tricuspid valve regurgitation is trivial. No evidence of tricuspid stenosis.  Aortic Valve: The aortic valve is abnormal. There is severe calcifcation of the aortic valve. Aortic valve regurgitation is mild. Aortic regurgitation PHT measures 484 msec. Aortic valve sclerosis/calcification is present, without any evidence of aortic stenosis. Aortic valve mean gradient measures 7.0 mmHg.  Pulmonic Valve: The pulmonic valve was normal in structure. Pulmonic valve regurgitation is trivial. No evidence of pulmonic stenosis.  Aorta: Aortic dilatation noted. There is borderline dilatation of the aortic root,  measuring 37 mm.  Venous: The inferior vena cava is normal in size with greater than 50% respiratory variability, suggesting right atrial pressure of 3 mmHg.  IAS/Shunts: There is right bowing of the interatrial septum, suggestive of elevated left atrial pressure. Cannot exclude a small PFO. A small patent foramen ovale is detected with predominantly left to right shunting across the atrial septum.   LEFT VENTRICLE PLAX 2D LVIDd:         5.10 cm   Diastology LVIDs:         2.40 cm   LV e'  medial:    8.05 cm/s LV PW:         1.40 cm   LV E/e' medial:  8.6 LV IVS:        1.60 cm   LV e' lateral:   8.59 cm/s LVOT diam:     2.50 cm   LV E/e' lateral: 8.0 LV SV:         90 LV SV Index:   36        2D Longitudinal Strain LVOT Area:     4.91 cm  2D Strain GLS (A2C):   -22.6 % 2D Strain GLS (A3C):   -22.4 % 2D Strain GLS (A4C):   -23.4 % 2D Strain GLS Avg:     -22.8 %  3D Volume EF: LV EDV:       160 ml LV ESV:       39 ml LV SV:        121 ml  RIGHT VENTRICLE RV Basal diam:  3.90 cm RV S prime:     19.10 cm/s TAPSE (M-mode): 3.1 cm  LEFT ATRIUM           Index        RIGHT ATRIUM           Index LA diam:      4.50 cm 1.80 cm/m   RA Area:     21.70 cm LA Vol (A4C): 72.9 ml 29.15 ml/m  RA Volume:   70.50 ml  28.19 ml/m AORTIC VALVE AV Mean Grad: 7.0 mmHg LVOT Vmax:    87.15 cm/s LVOT Vmean:   56.950 cm/s LVOT VTI:     0.184 m AI PHT:       484 msec  AORTA Ao Root diam: 3.70 cm Ao Asc diam:  3.50 cm  MITRAL VALVE               TRICUSPID VALVE MV Area (PHT): cm         TR Peak grad:   21.9 mmHg MV Decel Time: 203 msec    TR Vmax:        234.00 cm/s MV E velocity: 69.12 cm/s MV A velocity: 62.02 cm/s  SHUNTS MV E/A ratio:  1.11        Systemic VTI:  0.18 m Systemic Diam: 2.50 cm  Soyla Merck MD Electronically signed by Soyla Merck MD Signature Date/Time: 11/30/2021/10:17:23 AM    Final (Updated)      CT SCANS  CT CORONARY MORPH W/CTA COR W/SCORE 10/05/2020  Addendum 10/05/2020  1:08 PM ADDENDUM REPORT: 10/05/2020 13:06  CLINICAL DATA:  63 Year old White Male  EXAM: Cardiac/Coronary  CTA  TECHNIQUE: The patient was scanned on a [employer].  FINDINGS: A 100 kV prospective scan was triggered in the descending thoracic aorta at 111 HU's. Axial non-contrast 3 mm slices were carried out through the heart. The data set was  analyzed on a dedicated work station and scored using the [employer] . Gantry rotation speed was 250  msecs and collimation was .6 mm. No beta blockade and 0.8 mg of sl NTG was given. The 3D data set was reconstructed in 5% intervals of the 67-82 % of the R-R cycle. Diastolic phases were analyzed on a dedicated work station using MPR, MIP and VRT modes. The patient received 95 cc of contrast.  Aorta:  Normal size.  No calcifications.  No dissection.  Aortic Valve:  Tri-leaflet.  Calcification noted.  Aortic Valve Calcium  901  Coronary Arteries:  Normal coronary origin.  Right dominance.  Coronary Calcium  Score:  Left main: 0  Left anterior descending artery: 89  Left circumflex artery: 4  Right coronary artery: 209  Total: 301  Percentile: 86th for age, sex, and race matched control.  RCA is a large dominant artery that gives rise to PDA and PLA. There are minimal non-obstructive (1-24%) calcified plaques in the proximal vessel. There are minimal non-obstructive (1-24%) calcified plaques in the mid vessel. There are minimal non-obstructive (1-24%) calcified plaques in the distal vessel.  Left main is a large artery that gives rise to LAD and LCX arteries. There is no plaque.  LAD is a large vessel that gives rise to one large D1 Branch that bifurcates. There are minimal non-obstructive (1-24%) calcified plaques in the mid vessel.  LCX is a non-dominant artery that gives bifurcates. There is no plaque.  There is a ramus intermedius vessel with a mild non-obstructive (1-24%) calcified plaque in the mid vessel.  Other findings:  Normal pulmonary vein drainage into the left atrium.  Normal left atrial appendage without a thrombus.  Normal size of the pulmonary artery.  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  Coronary sinus dilation: 14 mm.  Extra-cardiac findings: See attached radiology report for non-cardiac structures.  IMPRESSION: 1. Coronary calcium  score of 301. This was 86th percentile for age, sex, and race  matched control.  2. Normal coronary origin with Right dominance.  3. CAD-RADS 2. Mild non-obstructive CAD (25-49%) worst in the Ramus Intermedius (small vessel). Consider non-atherosclerotic causes of chest pain. Consider preventive therapy and risk factor modification.  4. 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  Coronary sinus dilation: 14 mm  RECOMMENDATIONS:  Coronary artery calcium  (CAC) score is a strong predictor of incident coronary heart disease (CHD) and provides predictive information beyond traditional risk factors. CAC scoring is reasonable to use in the decision to withhold, postpone, or initiate statin therapy in intermediate-risk or selected borderline-risk asymptomatic adults (age 58-75 years and LDL-C ?70 to <190 mg/dL) who do not have diabetes or established atherosclerotic cardiovascular disease (ASCVD).* In intermediate-risk (10-year ASCVD risk ?7.5% to <20%) adults or selected borderline-risk (10-year ASCVD risk ?5% to <7.5%) adults in whom a CAC score is measured for the purpose of making a treatment decision the following recommendations have been made:  If CAC = 0, it is reasonable to withhold statin therapy and reassess in 5 to 10 years, as long as higher risk conditions are absent (diabetes mellitus, family history of premature CHD in first degree relatives (males <55 years; females <65 years), cigarette smoking, LDL ?190 mg/dL or other independent risk factors).  If CAC is 1 to 99, it is reasonable to initiate statin therapy for patients ?63 years of age.  If CAC is ?100 or ?75th percentile, it is reasonable to initiate statin therapy at any age.  Cardiology referral should be considered for patients with CAC scores ?400 or ?75th percentile.  *2018 AHA/ACC/AACVPR/AAPA/ABC/ACPM/ADA/AGS/APhA/ASPC/NLA/PCNA Guideline on the Management of Blood Cholesterol: A Report of the American College of  Cardiology/American Heart Association Task Force on Clinical Practice Guidelines. J Am Coll Cardiol. 2019;73(24):3168-3209.  Stanly Leavens, MD   Electronically Signed By: Stanly Leavens MD On: 10/05/2020 13:06  Narrative EXAM: OVER-READ INTERPRETATION  CT CHEST  The following report is an over-read performed by radiologist Dr. Toribio Aye of [facility] on 10/05/2020. This over-read does not include interpretation of cardiac or coronary anatomy or pathology. The coronary calcium  score/coronary CTA interpretation by the cardiologist is attached.  COMPARISON:  Cardiac CT 05/26/2018.  FINDINGS: Aortic atherosclerosis. Scattered areas of linear scarring are noted in the lung bases bilaterally. Within the visualized portions of the thorax there are no suspicious appearing pulmonary nodules or masses, there is no acute consolidative airspace disease, no pleural effusions, no pneumothorax and no lymphadenopathy. Visualized portions of the upper abdomen are unremarkable. There are no aggressive appearing lytic or blastic lesions noted in the visualized portions of the skeleton.  IMPRESSION: 1.  Aortic Atherosclerosis (ICD10-I70.0).  Electronically Signed: By: Toribio Aye M.D. On: 10/05/2020 08:19     ______________________________________________________________________________________________       Physical Exam:    VS:  BP 131/84 (BP Location: Left Arm)   Pulse (!) 57   Ht 5' 11 (1.803 m)   Wt 285 lb (129.3 kg)   SpO2 95%   BMI 39.75 kg/m    Wt Readings from Last 3 Encounters:  01/05/24 285 lb (129.3 kg)  12/15/23 290 lb 6.4 oz (131.7 kg)  12/01/23 293 lb 3.2 oz (133 kg)    Gen: no distress, morbid obesity   Neck: No JVD Ears:  Dempsey Sign Cardiac: No Rubs or Gallops, no Murmur, regular bradycardia, +2 radial pulses Respiratory: Clear to auscultation bilaterally, normal effort, normal  respiratory rate GI: Soft, nontender,  non-distended  MS: No  edema;  moves all extremities Integument: Skin feels warm Neuro:  At time of evaluation, alert and oriented to person/place/time/situation  Psych: Normal affect, patient feels ok   ASSESSMENT AND PLAN: .    Persistent atrial fibrillation Persistent atrial fibrillation managed with flecainide  and Eliquis . Sinus bradycardia with antiarrhythmic drug therapy. Well controlled with current therapy. No symptoms reported. Hgb stable. - Continue flecainide  and Eliquis  - seeing Dr. Nancey in 2026; will likely see me or him annualy for bi-annualr f/u unless worsening sx  Hypertension Morbid obesity - Obesity with recent weight loss. Currently on Ozempic , which has contributed to weight reduction. Continued weight loss may further improve blood pressure control. - Continue Ozempic  - Monitor weight and blood pressure - Blood pressure slightly above goal at 131/84 mmHg today, but generally well controlled. Weight loss may contribute to further blood pressure reduction. - Consider reducing diltiazem  if blood pressure decreases significantly - at 2026 echo we will re-eval LVH  Nonobstructive coronary artery disease Aortic atherosclerosis - Nonobstructive coronary artery disease with no current symptoms. Functional status is good. No need for stress testing before surgery. - Monitor for symptoms of coronary artery disease - reasonable to proceed for non-cardiac surgery; please send copy of our note to [facility] team  Mild aortic regurgitation - Mild aortic regurgitation. No audible murmur on exam today. 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 Currently on  Repatha  with excellent LDL control. Zetia  discontinued due to excellent cholesterol levels. - Discontinue Zetia  - Continue Repatha   Longitudinal care: The evaluation and management services  provided today reflect the complexity inherent in caring for this patient, including the ongoing longitudinal relationship and management of multiple chronic conditions and/or the need for care coordination. The visit required a comprehensive assessment and management plan tailored to the patient's unique needs Time was spent addressing not only the acute concerns but also the broader context of the patient's health, including preventive care, chronic disease management, and care coordination as appropriate.  Complex longitudinal is necessary for conditions including: aggressive secondary prevention and supporting patient with novel LDL therapy   Stanly Leavens, MD FASE [facility] Cardiologist [facility]  [address] [place], [address] [place], [place] [address] [phone]  10:56 AM

## 2024-01-06 ENCOUNTER — Other Ambulatory Visit: Payer: Self-pay | Admitting: Internal Medicine

## 2024-01-06 DIAGNOSIS — I480 Paroxysmal atrial fibrillation: Secondary | ICD-10-CM

## 2024-01-07 ENCOUNTER — Other Ambulatory Visit: Payer: Self-pay

## 2024-01-07 MED ORDER — DILTIAZEM HCL ER COATED BEADS 240 MG PO CP24: 240.0000 mg | ORAL_CAPSULE | Freq: Every day | ORAL | 3 refills | Status: AC

## 2024-01-07 NOTE — Telephone Encounter (Signed)
 Eliquis  5mg  refill request received. Patient is 63 years old, weight-129.3kg, Crea-0.86 on 12/02/23, Diagnosis-Afib, and last seen by Dr. Santo on 01/05/24. Dose is appropriate based on dosing criteria. Will send in refill to requested pharmacy.

## 2024-01-08 ENCOUNTER — Other Ambulatory Visit: Payer: Self-pay

## 2024-01-08 ENCOUNTER — Encounter: Payer: Self-pay | Admitting: Family

## 2024-01-08 ENCOUNTER — Ambulatory Visit: Admitting: Family

## 2024-01-08 ENCOUNTER — Other Ambulatory Visit (HOSPITAL_COMMUNITY): Payer: Self-pay

## 2024-01-08 ENCOUNTER — Ambulatory Visit

## 2024-01-08 VITALS — BP 137/86 | HR 63 | Temp 98.3°F | Ht 71.0 in | Wt 285.0 lb

## 2024-01-08 DIAGNOSIS — Z0000 Encounter for general adult medical examination without abnormal findings: Secondary | ICD-10-CM | POA: Insufficient documentation

## 2024-01-08 DIAGNOSIS — Z21 Asymptomatic human immunodeficiency virus [HIV] infection status: Secondary | ICD-10-CM

## 2024-01-08 DIAGNOSIS — B20 Human immunodeficiency virus [HIV] disease: Secondary | ICD-10-CM

## 2024-01-08 MED ORDER — TRIUMEQ 600-50-300 MG PO TABS
1.0000 | ORAL_TABLET | Freq: Every day | ORAL | 11 refills | Status: AC
Start: 2024-01-08 — End: ?

## 2024-01-08 NOTE — Telephone Encounter (Signed)
 Pharmacy Patient Advocate Encounter  Received notification from [employer] that Prior Authorization for Ozempic  (0.25 or 0.5 MG/DOSE) 2MG /3ML pen-injectors has been APPROVED from 12/29/2023 to 06/23/2024. Unable to obtain price due to refill too soon rejection, last fill date 01/06/2024 next available fill date 01/27/2024   PA #/Case ID/Reference #: [id number]

## 2024-01-08 NOTE — Assessment & Plan Note (Signed)
 Discussed importance of safe sexual practice and condom use. Condoms and site specific STD testing offered.  Vaccinations reviewed and up to date.  Dental care up to date.  Colon cancer screening up to date (due next year).

## 2024-01-08 NOTE — Assessment & Plan Note (Signed)
 Ethan Lambert continues to have well-controlled virus with good adherence and tolerance to Triumeq .  Reviewed previous lab work and discussed plan of care and U equals U.  Covered by [employer] and will meet with financial counselor.  Social determinants of health reviewed with no interventions indicated.  Check blood work.  Continue current dose of Triumeq .  Plan for follow-up in 1 year or sooner if needed with lab work on same day.

## 2024-01-08 NOTE — Patient Instructions (Addendum)
Nice to see you.  We will check your lab work today.  Continue to take your medication daily as prescribed.  Refills have been sent to the pharmacy.  Plan for follow up in 1 year or sooner if needed with lab work on the same day.  Have a great day and stay safe!

## 2024-01-08 NOTE — Progress Notes (Signed)
 Brief Narrative   Patient ID: Ethan Lambert, male    DOB: 1960/08/15, 63 y.o.   MRN: [id number]  Ethan Lambert is a 63 y/o caucasian male diagnosed with HIV-1 disease in April 1999 with risk factor of MSM. Initial viral load was 2,311 with CD4 count 40. Entered care at [facility] Stage 3. No history of opportunistic infection. ART experienced with Atripla and Triumeq .   Subjective:   Chief Complaint  Patient presents with   Follow-up    B20    HPI:  Ethan Lambert is a 63 y.o. male with HIV disease last seen by Dr. Efrain on 10/25/22 with well-controlled virus and good adherence and tolerance to Triumeq .  Viral load was undetectable with CD4 count 746.  Kidney function, liver function, electrolytes within normal ranges.  Here today for routine follow-up.  Ethan Lambert has been doing okay since his last office visit and continues to take Triumeq  as prescribed with no adverse side effects or problems obtaining medication from pharmacy. Covered by [employer]. Currently awaiting scheduling for his shoulder surgery. Housing, access to food and transportation are stable. Diagnosed with diabetes and started on Ozempic  and has lost about 10 pounds thus far and has improvement in neuropathy symptoms. Routine dental care up to date. Sexually active with condoms and site specific STD testing offered. Healthcare maintenance reviewed.  Denies fevers, chills, night sweats, headaches, changes in vision, neck pain/stiffness, nausea, diarrhea, vomiting, lesions or rashes.  Lab Results  Component Value Date   CD4TCELL 37 11/14/2022   CD4TABS 746 11/14/2022   Lab Results  Component Value Date   HIV1RNAQUANT Not Detected 11/14/2022     Allergies  Allergen Reactions   Ms Contin [Morphine] Other (See Comments)    Hallucinations    Morphine And Codeine Other (See Comments)      Outpatient Medications Prior to Visit  Medication Sig Dispense Refill   albuterol  (VENTOLIN  HFA) 108 (90 Base) MCG/ACT  inhaler TAKE 2 PUFFS BY MOUTH EVERY 6 HOURS AS NEEDED FOR WHEEZE OR SHORTNESS OF BREATH 8.5 each 0   cyanocobalamin  (VITAMIN B12) 1000 MCG tablet Take 1 tablet (1,000 mcg total) by mouth daily. 90 tablet 3   diltiazem  (CARDIZEM  CD) 240 MG 24 hr capsule Take 1 capsule (240 mg total) by mouth daily. 90 capsule 3   diltiazem  (CARDIZEM ) 30 MG tablet Take 1 tablet every 4 hours AS NEEDED for afib heart rate >100 30 tablet 1   ELIQUIS  5 MG TABS tablet TAKE 1 TABLET BY MOUTH TWICE A DAY 180 tablet 1   Evolocumab  (REPATHA  SURECLICK) 140 MG/ML SOAJ Inject 140 mg into the skin every 14 (fourteen) days. 2 mL 11   flecainide  (TAMBOCOR ) 50 MG tablet Take 1 tablet (50 mg total) by mouth 2 (two) times daily. 180 tablet 3   oxyCODONE  (OXY IR/ROXICODONE ) 5 MG immediate release tablet Take 5 mg by mouth every 6 (six) hours as needed for moderate pain (pain score 4-6) or severe pain (pain score 7-10).     Semaglutide ,0.25 or 0.5MG /DOS, (OZEMPIC , 0.25 OR 0.5 MG/DOSE,) 2 MG/3ML SOPN Inject 0.25 mg into the skin once a week. 3 mL 1   abacavir -dolutegravir -lamiVUDine  (TRIUMEQ ) 600-50-300 MG tablet Take 1 tablet by mouth daily. 30 tablet 11   No facility-administered medications prior to visit.     Past Medical History:  Diagnosis Date   Anemia    Arthritis    probably right ring finger joint (05/28/2018)   Childhood asthma  as child none now   Clotting disorder (HCC)    CVA (cerebral vascular accident) (HCC) 1999 X 2   LEFT FRONTAL & OCCIPITAL , NON-HEMORRHAGIC; no residual   Family history of malignant neoplasm of gastrointestinal tract    Heart murmur    History of blood transfusion 1999   related to the stroke   History of kidney stones    HIV disease (HCC) dx 1999   MONITORED BY INFECTIOUS DISEASE -- DR LAMAR COMER   Hypertension    Paroxysmal atrial fibrillation (HCC)    EP -- Dr. Nancey   Right knee meniscal tear    Seizures (HCC)    1999   Skin cancer    skin   Sleep apnea    didn't  follow thru w/mask (05/28/2018)     Past Surgical History:  Procedure Laterality Date   APPENDECTOMY  06-09-2003   ATRIAL FIBRILLATION ABLATION  05/28/2018   ATRIAL FIBRILLATION ABLATION N/A 05/28/2018   Procedure: ATRIAL FIBRILLATION ABLATION;  Surgeon: Kelsie Agent, MD;  Location: [facility];  Service: Cardiovascular;  Laterality: N/A;   BACK SURGERY     INGUINAL HERNIA REPAIR Bilateral 1990s   KNEE ARTHROSCOPY WITH MEDIAL MENISECTOMY Right 12/31/2013   Procedure: RIGHT KNEE ARTHROSCOPY PARTIAL MEDIAL MENISCECTOMY DEBRIDEMENT AND CHONDROPLASTY ;  Surgeon: LAMAR Collet, MD;  Location: [facility];  Service: Orthopedics;  Laterality: Right;   KNEE ARTHROSCOPY WITH MEDIAL MENISECTOMY Left 01/27/2019   Procedure: LEFT KNEE ARTHROSCOPY WITH PARTIAL MEDIAL MENISCECTOMY;  Surgeon: Jerri Kay HERO, MD;  Location: [facility];  Service: Orthopedics;  Laterality: Left;   LAPAROSCOPIC CHOLECYSTECTOMY  07-08-2003   LASER ABLATION ANAL CONDYLOMA  05-18-2001   LUMBAR LAMINECTOMY/DECOMPRESSION MICRODISCECTOMY Left 03/15/2015   Procedure: Left Lumbar five- sacral one microdiskectomy;  Surgeon: Gerldine Maizes, MD;  Location: [facility];  Service: Neurosurgery;  Laterality: Left;  Left L5S1 microdiskectomy   NEGATIVE SLEEP STUDY  01-03-2012  in epic   SKIN CANCER EXCISION Right    cut it off my shoulder   TRANSTHORACIC ECHOCARDIOGRAM  12-01-2008   MODERATE LVH/  EF 55-60%/  MILD Ethan/  NEGATIVE BUBBLE STUDY        Review of Systems  Constitutional:  Negative for appetite change, chills, fatigue, fever and unexpected weight change.  Eyes:  Negative for visual disturbance.  Respiratory:  Negative for cough, chest tightness, shortness of breath and wheezing.   Cardiovascular:  Negative for chest pain and leg swelling.  Gastrointestinal:  Negative for abdominal pain, constipation, diarrhea, nausea and vomiting.  Genitourinary:  Negative for dysuria, flank pain,  frequency, genital sores, hematuria and urgency.  Skin:  Negative for rash.  Allergic/Immunologic: Negative for immunocompromised state.  Neurological:  Negative for dizziness and headaches.     Objective:   BP 137/86   Pulse 63   Temp 98.3 F (36.8 C) (Temporal)   Ht 5' 11 (1.803 m)   Wt 285 lb (129.3 kg)   SpO2 95%   BMI 39.75 kg/m  Nursing note and vital signs reviewed.  Physical Exam Constitutional:      General: He is not in acute distress.    Appearance: He is well-developed.  Eyes:     Conjunctiva/sclera: Conjunctivae normal.  Cardiovascular:     Rate and Rhythm: Normal rate and regular rhythm.     Heart sounds: Normal heart sounds. No murmur heard.    No friction rub. No gallop.  Pulmonary:     Effort: Pulmonary effort  is normal. No respiratory distress.     Breath sounds: Normal breath sounds. No wheezing or rales.  Chest:     Chest wall: No tenderness.  Musculoskeletal:     Cervical back: Neck supple.  Lymphadenopathy:     Cervical: No cervical adenopathy.  Skin:    General: Skin is warm and dry.  Neurological:     Mental Status: He is alert and oriented to person, place, and time.  Psychiatric:        Mood and Affect: Mood normal.          01/08/2024    2:51 PM 12/15/2023    1:29 PM 11/14/2022    8:35 AM 01/24/2022   10:31 AM 10/31/2021    8:36 AM  Depression screen PHQ 2/9  Decreased Interest 0 0 0 2 0  Down, Depressed, Hopeless 0 0 0 0 0  PHQ - 2 Score 0 0 0 2 0  Altered sleeping 0   3   Tired, decreased energy 0   2   Change in appetite 0   2   Feeling bad or failure about yourself  0   0   Trouble concentrating 0   0   Moving slowly or fidgety/restless 0   0   Suicidal thoughts 0   0   PHQ-9 Score 0   9   Difficult doing work/chores    Extremely dIfficult         01/08/2024    2:51 PM 01/24/2022   10:31 AM 11/17/2020    8:58 AM 11/01/2019    8:50 AM  GAD 7 : Generalized Anxiety Score  Nervous, Anxious, on Edge 0 0 0 0  Control/stop  worrying 0 0 0 0  Worry too much - different things 0 0 0 0  Trouble relaxing 0 2 0 0  Restless 0 0 0 0  Easily annoyed or irritable 0 0 0 0  Afraid - awful might happen 0 0 0 0  Total GAD 7 Score 0 2 0 0  Anxiety Difficulty  Extremely difficult Not difficult at all Not difficult at all     The ASCVD Risk score (Arnett DK, et al., 2019) failed to calculate for the following reasons:   Risk score cannot be calculated because patient has a medical history suggesting prior/existing ASCVD      Assessment & Plan:    Patient Active Problem List   Diagnosis Date Noted   Healthcare maintenance 01/08/2024   Vitamin B12 deficiency 12/15/2023   Type 2 diabetes mellitus with proteinuria (HCC) 12/08/2023   Urinary frequency 12/01/2023   Peripheral neuropathy 12/01/2023   Class 3 severe obesity due to excess calories with serious comorbidity and body mass index (BMI) of 40.0 to 44.9 in adult 12/01/2023   OSA on CPAP 06/13/2023   Chronic pain of right knee 07/22/2022   Asthma 05/08/2022   Loud snoring 05/08/2022   Atrial fibrillation with RVR (HCC) 04/19/2022   Insomnia 01/24/2022   Nonrheumatic aortic valve insufficiency 11/08/2021   Encounter for screening for infections with predominantly sexual mode of transmission 10/31/2021   Aortic atherosclerosis (HCC) 07/06/2021   Morbid obesity (HCC) 09/14/2020   Bronchitis 09/14/2020   Hyperlipidemia 09/14/2020   Acute medial meniscus tear, left, subsequent encounter 01/22/2019   Rotator cuff syndrome of left shoulder 07/10/2018   Paroxysmal atrial fibrillation (HCC) 05/28/2018   Subacromial bursitis of left shoulder joint 05/19/2018   Medication monitoring encounter 02/12/2017   Sprain of shoulder, right 10/01/2016  Shoulder pain, right 09/26/2016   Acromioclavicular joint arthritis 09/26/2016   Encounter for long-term (current) use of medications 06/08/2015   HNP (herniated nucleus pulposus), lumbar 03/15/2015   Fatigue 01/26/2015   S/P  right knee arthroscopy 12/31/2013   INGUINAL HERNIORRHAPHIES, BILATERAL, HX OF 01/15/2010   VARICOCELE 07/05/2009   HEMATOSPERMIA 06/27/2009   Essential hypertension 12/08/2008   Atrial fibrillation (HCC) 12/08/2008   Cerebral artery occlusion with cerebral infarction (HCC) 12/08/2008   HIV infection (HCC) 11/18/2006     Problem List Items Addressed This Visit       Other   HIV infection (HCC) - Primary   Ethan Lambert continues to have well-controlled virus with good adherence and tolerance to Triumeq .  Reviewed previous lab work and discussed plan of care and U equals U.  Covered by [employer] and will meet with financial counselor.  Social determinants of health reviewed with no interventions indicated.  Check blood work.  Continue current dose of Triumeq .  Plan for follow-up in 1 year or sooner if needed with lab work on same day.      Relevant Medications   abacavir -dolutegravir -lamiVUDine  (TRIUMEQ ) 600-50-300 MG tablet   Other Relevant Orders   HIV-1 RNA quant-no reflex-bld   T-helper cell (CD4)- ([facility] clinic only)   Healthcare maintenance   Discussed importance of safe sexual practice and condom use. Condoms and site specific STD testing offered.  Vaccinations reviewed and up to date.  Dental care up to date.  Colon cancer screening up to date (due next year).        I am having Ethan Lambert maintain his diltiazem , albuterol , oxyCODONE , Repatha  SureClick, flecainide , cyanocobalamin , Ozempic  (0.25 or 0.5 MG/DOSE), Eliquis , diltiazem , and Triumeq .   Meds ordered this encounter  Medications   abacavir -dolutegravir -lamiVUDine  (TRIUMEQ ) 600-50-300 MG tablet    Sig: Take 1 tablet by mouth daily.    Dispense:  30 tablet    Refill:  11    Supervising Provider:   LUIZ CHANNEL [phone]    Prescription Type::   Renewal     Follow-up: Return in about 1 year (around 01/07/2025). or sooner if needed.    Cathlyn July, MSN, FNP-C Nurse  Practitioner [facility] Main number: [phone]

## 2024-01-09 LAB — T-HELPER CELL (CD4) - (RCID CLINIC ONLY)
CD4 % Helper T Cell: 44 % (ref 33–65)
CD4 T Cell Abs: 880 /uL (ref 400–1790)

## 2024-01-10 LAB — HIV-1 RNA QUANT-NO REFLEX-BLD
HIV 1 RNA Quant: NOT DETECTED {copies}/mL
HIV-1 RNA Quant, Log: NOT DETECTED {Log_copies}/mL

## 2024-01-12 ENCOUNTER — Ambulatory Visit: Payer: Self-pay | Admitting: Family

## 2024-01-26 NOTE — Telephone Encounter (Signed)
 Preoperative clearance form received on 01/26/2024. I called [facility] to see if form needed to be re faxed or if additional information was needed; left VM for call back   Form as placed in provider folder on 01/26/2024 if needed.

## 2024-01-27 ENCOUNTER — Telehealth: Payer: Self-pay | Admitting: Family Medicine

## 2024-01-27 NOTE — Telephone Encounter (Signed)
 Pt returning your call about [facility].

## 2024-01-27 NOTE — Telephone Encounter (Signed)
 Returned phone call; left VM for call back.

## 2024-01-28 NOTE — Telephone Encounter (Signed)
 Follow up call to [facility] and fax was received; patient was scheduled for surgery.

## 2024-01-28 NOTE — Telephone Encounter (Signed)
 Spoke to [facility] on 01/27/2024. Paperwork work was not received and a new copy was faxed to the office to complete.   New fax was received and paperwork was signed by Dr. Sebastian MD and sent with confirmation attached; a follow up call will be made to insure paperwork was received.

## 2024-02-01 ENCOUNTER — Other Ambulatory Visit: Payer: Self-pay | Admitting: Family Medicine

## 2024-02-01 DIAGNOSIS — I10 Essential (primary) hypertension: Secondary | ICD-10-CM

## 2024-02-01 DIAGNOSIS — I6350 Cerebral infarction due to unspecified occlusion or stenosis of unspecified cerebral artery: Secondary | ICD-10-CM

## 2024-02-01 DIAGNOSIS — E782 Mixed hyperlipidemia: Secondary | ICD-10-CM

## 2024-02-01 DIAGNOSIS — E1129 Type 2 diabetes mellitus with other diabetic kidney complication: Secondary | ICD-10-CM

## 2024-02-01 DIAGNOSIS — I480 Paroxysmal atrial fibrillation: Secondary | ICD-10-CM

## 2024-02-01 DIAGNOSIS — I700 Atherosclerosis of aorta: Secondary | ICD-10-CM

## 2024-02-01 DIAGNOSIS — G4733 Obstructive sleep apnea (adult) (pediatric): Secondary | ICD-10-CM

## 2024-02-03 DIAGNOSIS — X32XXXA Exposure to sunlight, initial encounter: Secondary | ICD-10-CM | POA: Diagnosis not present

## 2024-02-03 DIAGNOSIS — L570 Actinic keratosis: Secondary | ICD-10-CM | POA: Diagnosis not present

## 2024-02-03 DIAGNOSIS — L820 Inflamed seborrheic keratosis: Secondary | ICD-10-CM | POA: Diagnosis not present

## 2024-02-03 NOTE — Telephone Encounter (Signed)
 Noted

## 2024-02-06 ENCOUNTER — Ambulatory Visit: Attending: Cardiovascular Disease | Admitting: Cardiovascular Disease

## 2024-02-06 ENCOUNTER — Encounter: Payer: Self-pay | Admitting: Cardiovascular Disease

## 2024-02-06 VITALS — BP 120/86 | HR 76 | Ht 71.0 in | Wt 273.0 lb

## 2024-02-06 DIAGNOSIS — I480 Paroxysmal atrial fibrillation: Secondary | ICD-10-CM | POA: Diagnosis not present

## 2024-02-06 NOTE — Patient Instructions (Signed)
 Medication Instructions:  Your physician recommends that you continue on your current medications as directed. Please refer to the Current Medication list given to you today.  *If you need a refill on your cardiac medications before your next appointment, please call your pharmacy*   Follow-Up: At [facility], you and your health needs are our priority.  As part of our continuing mission to provide you with exceptional heart care, our providers are all part of one team.  This team includes your primary Cardiologist (physician) and Advanced Practice Providers or APPs (Physician Assistants and Nurse Practitioners) who all work together to provide you with the care you need, when you need it.  Your next appointment:   6 months  Provider:   You may see Eulas FORBES Furbish, MD or one of the following Advanced Practice Providers on your designated Care Team:   Charlies Arthur, [place] Jodie Passey, PA-C Suzann Riddle, NP Daphne Barrack, NP

## 2024-02-06 NOTE — Progress Notes (Signed)
  Electrophysiology Office Note:    Date:  02/06/2024   ID:  Ethan Lambert, DOB 18-Apr-1961, MRN [id number]  PCP:  Sebastian Beverley NOVAK, MD   [facility] Providers Cardiologist:  Stanly DELENA Leavens, MD Electrophysiologist:  Eulas FORBES Furbish, MD     Referring MD: Sebastian Beverley NOVAK, MD   Chief complaint: atrial fibrillation  History of Present Illness:    Ethan Lambert is a 63 y.o. male with a hx of PAF s/p ablation, subsequently managed with flecainide  as needed  He had an AF ablation by Dr. Kelsie in 2019. He resumed flecainide  50 when he had recurrence. He has recently begun to have recurrences again, so flecainide  was increased to 100mg  PO BID. This helped with the burden and intensity of episodes, but he continues to still have symptoms.  He was diagnosed with sleep apnea and uses CPAP.  His sleep is much better.  He feels much better.    He started using Ozempic  and has made progress with weight loss.  He has not appreciated any recurrence of atrial fibrillation      EKGs/Labs/Other Studies Reviewed Today:     TTE 11/30/21       Recent Labs: 11/18/2023: Hemoglobin 15.5; Platelets 286 12/02/2023: ALT 35; BUN 13; Creatinine, Ser 0.86; Magnesium  2.0; Potassium 4.3; Sodium 138; TSH 2.44     Physical Exam:    VS:  There were no vitals taken for this visit.    Wt Readings from Last 3 Encounters:  01/08/24 285 lb (129.3 kg)  01/05/24 285 lb (129.3 kg)  12/15/23 290 lb 6.4 oz (131.7 kg)     GEN:  Well nourished, well developed in no acute distress CARDIAC: RRR, no murmurs, rubs, gallops RESPIRATORY:  Normal work of breathing MUSCULOSKELETAL: no edema    ASSESSMENT & PLAN:    Atrial fibrillation:  having recurrence despite being on flecainide  prior to starting CPAP.   doing better now that he is using CPAP.     He is making progress with weight loss on Ozempic   High risk medication:  on flecainide . Continue diltiazem .  With RBBB -- stable on  50mg  of flecainide  Will plan to DC flecainide  next visit if he continues to lose weight and remains free of AF  OSA:  now managed with CPAP  Obesity:  we discussed the importance of weight loss for maintaining sinus rhythm. On Ozempic , making progress        Medication Adjustments/Labs and Tests Ordered: Current medicines are reviewed at length with the patient today.  Concerns regarding medicines are outlined above.  No orders of the defined types were placed in this encounter.  No orders of the defined types were placed in this encounter.    Signed, Eulas FORBES Furbish, MD  02/06/2024 8:43 AM    [facility]

## 2024-03-05 ENCOUNTER — Telehealth: Payer: Self-pay

## 2024-03-05 NOTE — Telephone Encounter (Signed)
 Called to know whether patient is still using CPAP machine. Informed patient to bring SD card to appointment ( voice message).

## 2024-03-08 ENCOUNTER — Encounter: Payer: Self-pay | Admitting: Pulmonary Disease

## 2024-03-08 ENCOUNTER — Ambulatory Visit: Admitting: Pulmonary Disease

## 2024-03-08 VITALS — BP 130/72 | HR 60 | Temp 97.8°F | Ht 71.0 in | Wt 276.0 lb

## 2024-03-08 DIAGNOSIS — G4733 Obstructive sleep apnea (adult) (pediatric): Secondary | ICD-10-CM | POA: Diagnosis not present

## 2024-03-08 NOTE — Progress Notes (Signed)
 @Patient  ID: Ethan Lambert, male    DOB: 1960/10/21, 63 y.o.   MRN: [id number]  Chief Complaint  Patient presents with   Asthma    Well controlled    Sleep Apnea    Referring provider: Sebastian Beverley NOVAK, MD  HPI:   63 y.o. man whom I am seeing for evaluation of asthma and prolonged bronchitis as well as OSA on CPAP.  Most recent cardiology note x 2 reviewed.  Most recent PCP note reviewed.  Overall doing well.  Remains off inhalers.  Rare rescue inhaler use.  Sinus congestion throughout the summer, sinus infection.  Hard to use CPAP at that time.  Now better.  Albuterol  use once or twice a month.  He has lost about 20 pounds since last visit.  HPI at initial visit: Prolonged bronchitis, brief hospital stay 04/2022.  Ongoing cough etc. for months afterwards.  Has been followed in pulmonary clinic.  Currently Arnuity.  He feels like his breathing is slowly improving.  Much better than prior.  He feels like Arnuity does help.  Not using albuterol  as frequently.  Has some more sinus congestion over the last couple weeks with the pollens, change in weather etc.  Overall, it seems like over the last several months, breathing is steadily improved.  Maybe not totally back to baseline but better, significant so than prior.  He has a lot of muscles multiple issues.  This is his main limitation in terms of activity etc.  Questionaires / Pulmonary Flowsheets:   ACT:  Asthma Control Test ACT Total Score  03/08/2024  4:03 PM 16  10/31/2022  8:32 AM 20    MMRC:     No data to display          Epworth:     05/08/2022    8:50 AM  Results of the Epworth flowsheet  Sitting and reading 3  Watching TV 3  Sitting, inactive in a public place (e.g. a theatre or a meeting) 3  As a passenger in a car for an hour without a break 3  Lying down to rest in the afternoon when circumstances permit 3  Sitting and talking to someone 1  Sitting quietly after a lunch without alcohol 3  In a car,  while stopped for a few minutes in traffic 0  Total score 19    Tests:   FENO:  No results found for: NITRICOXIDE  PFT:     No data to display          WALK:      No data to display          Imaging: Personally reviewed and as per EMR discussion this note No results found.  Lab Results: Personally reviewed CBC    Component Value Date/Time   WBC 8.5 11/18/2023 0829   WBC 9.4 04/19/2022 0203   RBC 5.06 11/18/2023 0829   RBC 4.70 04/19/2022 0203   HGB 15.5 11/18/2023 0829   HCT 47.8 11/18/2023 0829   PLT 286 11/18/2023 0829   MCV 95 11/18/2023 0829   MCH 30.6 11/18/2023 0829   MCH 31.1 04/19/2022 0203   MCHC 32.4 11/18/2023 0829   MCHC 33.8 04/19/2022 0203   RDW 13.4 11/18/2023 0829   LYMPHSABS 2.2 04/18/2022 0859   LYMPHSABS 2.4 05/11/2018 1022   MONOABS 0.9 04/18/2022 0859   EOSABS 0.3 04/18/2022 0859   EOSABS 0.1 05/11/2018 1022   BASOSABS 0.1 04/18/2022 0859   BASOSABS 0.1 05/11/2018 1022  BMET    Component Value Date/Time   NA 138 12/02/2023 0853   NA 140 05/09/2023 1032   K 4.3 12/02/2023 0853   CL 104 12/02/2023 0853   CO2 27 12/02/2023 0853   GLUCOSE 120 (H) 12/02/2023 0853   BUN 13 12/02/2023 0853   BUN 15 05/09/2023 1032   CREATININE 0.86 12/02/2023 0853   CREATININE 0.89 10/31/2021 0853   CALCIUM  9.1 12/02/2023 0853   GFRNONAA >60 04/19/2022 0203   GFRNONAA 77 08/15/2020 0920   GFRAA 90 08/15/2020 0920    BNP    Component Value Date/Time   BNP 10.7 04/18/2022 0859    ProBNP    Component Value Date/Time   PROBNP 12.0 04/11/2022 1013    Specialty Problems       Pulmonary Problems   Bronchitis   Asthma   Loud snoring   - Patient has loud snoring. Epworth score 19. Needs home sleep study       OSA on CPAP    Allergies  Allergen Reactions   Ms Contin [Morphine] Other (See Comments)    Hallucinations    Morphine And Codeine Other (See Comments)    Immunization History  Administered Date(s) Administered    Dtap, Unspecified 10/07/1978   Fluzone Influenza virus vaccine,trivalent (IIV3), split virus 05/26/2004   H1N1 09/15/2008   Hepatitis B 08/05/2000, 03/25/2002, 09/30/2002   Influenza Split 04/07/2012, 03/14/2016   Influenza Whole 03/27/2006, 04/14/2008, 04/07/2009, 04/04/2010   Influenza, Seasonal, Injecte, Preservative Fre 04/08/2015   Influenza,inj,Quad PF,6+ Mos 05/12/2013, 04/06/2017, 05/12/2019, 04/12/2020, 04/19/2022   Janssen (J&J) SARS-COV-2 Vaccination 05/15/2020   Measles 10/07/1978   PFIZER Comirnaty(Gray Top)Covid-19 Tri-Sucrose Vaccine 09/01/2020   Pneumococcal Conjugate-13 07/29/2019   Pneumococcal Polysaccharide-23 09/08/2001, 12/10/2007, 11/10/2012   Rubella 10/07/1978   Tdap 03/14/2016    Past Medical History:  Diagnosis Date   Anemia    Arthritis    probably right ring finger joint (05/28/2018)   Childhood asthma    as child none now   Clotting disorder (HCC)    CVA (cerebral vascular accident) (HCC) 1999 X 2   LEFT FRONTAL & OCCIPITAL , NON-HEMORRHAGIC; no residual   Family history of malignant neoplasm of gastrointestinal tract    Heart murmur    History of blood transfusion 1999   related to the stroke   History of kidney stones    HIV disease (HCC) dx 1999   MONITORED BY INFECTIOUS DISEASE -- DR LAMAR COMER   Hypertension    Paroxysmal atrial fibrillation (HCC)    EP -- Dr. Nancey   Right knee meniscal tear    Seizures (HCC)    1999   Skin cancer    skin   Sleep apnea    didn't follow thru w/mask (05/28/2018)    Tobacco History: Social History   Tobacco Use  Smoking Status Never   Passive exposure: Never  Smokeless Tobacco Never   Counseling given: Not Answered   Continue to not smoke  Outpatient Encounter Medications as of 03/08/2024  Medication Sig   abacavir -dolutegravir -lamiVUDine  (TRIUMEQ ) 600-50-300 MG tablet Take 1 tablet by mouth daily.   albuterol  (VENTOLIN  HFA) 108 (90 Base) MCG/ACT inhaler TAKE 2 PUFFS BY MOUTH  EVERY 6 HOURS AS NEEDED FOR WHEEZE OR SHORTNESS OF BREATH   cyanocobalamin  (VITAMIN B12) 1000 MCG tablet Take 1 tablet (1,000 mcg total) by mouth daily.   diltiazem  (CARDIZEM  CD) 240 MG 24 hr capsule Take 1 capsule (240 mg total) by mouth daily.   diltiazem  (CARDIZEM ) 30 MG  tablet Take 1 tablet every 4 hours AS NEEDED for afib heart rate >100   ELIQUIS  5 MG TABS tablet TAKE 1 TABLET BY MOUTH TWICE A DAY   Evolocumab  (REPATHA  SURECLICK) 140 MG/ML SOAJ Inject 140 mg into the skin every 14 (fourteen) days.   flecainide  (TAMBOCOR ) 50 MG tablet Take 1 tablet (50 mg total) by mouth 2 (two) times daily.   Semaglutide ,0.25 or 0.5MG /DOS, (OZEMPIC , 0.25 OR 0.5 MG/DOSE,) 2 MG/3ML SOPN INJECT 0.25MG  INTO THE SKIN ONE TIME PER WEEK   No facility-administered encounter medications on file as of 03/08/2024.     Review of Systems  Review of Systems  N/a Physical Exam  BP 130/72 (BP Location: Left Arm, Patient Position: Sitting, Cuff Size: Large)   Pulse 60   Temp 97.8 F (36.6 C) (Oral)   Ht 5' 11 (1.803 m)   Wt 276 lb (125.2 kg)   SpO2 97%   BMI 38.49 kg/m   Wt Readings from Last 5 Encounters:  03/08/24 276 lb (125.2 kg)  02/06/24 273 lb (123.8 kg)  01/08/24 285 lb (129.3 kg)  01/05/24 285 lb (129.3 kg)  12/15/23 290 lb 6.4 oz (131.7 kg)    BMI Readings from Last 5 Encounters:  03/08/24 38.49 kg/m  02/06/24 38.08 kg/m  01/08/24 39.75 kg/m  01/05/24 39.75 kg/m  12/15/23 40.50 kg/m     Physical Exam General: Sitting in chair, no acute distress Eyes: EOMI, no icterus Neck: Supple, no JVP Pulmonary: Clear, no work of breathing Cardiovascular: Warm, no edema Abdomen: Nondistended, sounds present MSK: No synovitis, no joint effusion Neuro: Normal gait, no weakness Psych: Normal mood, full affect  Assessment & Plan:   Asthma: Clinical diagnosis with prolonged bronchitis over the winter 2023-2024.  Gradually improved with time.  Off maintenance inhaler for many months now  stable.  Continue albuterol  as needed.  Rare use reported.  Obstructive sleep apnea on CPAP: Diagnosed Home sleep test 08/22/2022, mild, AHI 5.2.  Excellent adherence to CPAP.  He reports significant improvement in sleep and daytime symptoms.  He is benefiting clinically from ongoing CPAP use not continue to encourage him to use this.  New order for CPAP supplies.   Return in about 6 months (around 09/05/2024) for f/u Dr. Annella CPAP.   Donnice JONELLE Annella, MD 03/08/2024

## 2024-03-09 NOTE — Patient Instructions (Signed)
 SURGICAL WAITING ROOM VISITATION Patients having surgery or a procedure may have no more than 2 support people in the waiting area - these visitors may rotate in the visitor waiting room.   If the patient needs to stay at the hospital during part of their recovery, the visitor guidelines for inpatient rooms apply.  PRE-OP  VISITATION  Pre-op  nurse will coordinate an appropriate time for 1 support person to accompany the patient in pre-op .  This support person may not rotate.  This visitor will be contacted when the time is appropriate for the visitor to come back in the pre-op  area.  Please refer to the [facility] website for the visitor guidelines for Inpatients (after your surgery is over and you are in a regular room).  You are not required to quarantine at this time prior to your surgery. However, you must do this: Hand Hygiene often Do NOT share personal items Notify your provider if you are in close contact with someone who has COVID or you develop fever 100.4 or greater, new onset of sneezing, cough, sore throat, shortness of breath or body aches.  If you test positive for Covid or have been in contact with anyone that has tested positive in the last 10 days please notify you surgeon.    Your procedure is scheduled on:  FRIDAY March 19, 2024  Report to [facility] Main Entrance: [address] entrance where the [employer] is available.   Report to admitting at:  08:00   AM  Call this number if you have any questions or problems the morning of surgery [phone]  Do not eat food after Midnight the night prior to your surgery/procedure.  After Midnight you may have the following liquids until   07:30 AM DAY OF SURGERY  Clear Liquid Diet Water Black Coffee (sugar ok, NO MILK/CREAM OR CREAMERS)  Tea (sugar ok, NO MILK/CREAM OR CREAMERS) regular and decaf                             Plain Jell-O  with no fruit (NO RED)                                           Fruit  ices (not with fruit pulp, NO RED)                                     Popsicles (NO RED)                                                                  Juice: NO CITRUS JUICES: only apple, WHITE grape, WHITE cranberry Sports drinks like Gatorade or Powerade (NO RED)                   The day of surgery:  Drink ONE (1) Pre-Surgery G2 at  07:30 AM the morning of surgery. Drink in one sitting. Do not sip.  This drink was given to you during your hospital pre-op  appointment visit. Nothing else to drink after completing the  Pre-Surgery G2 : No candy, chewing gum or throat lozenges.    FOLLOW ANY ADDITIONAL PRE OP INSTRUCTIONS YOU RECEIVED FROM YOUR SURGEON'S OFFICE!!!   Oral Hygiene is also important to reduce your risk of infection.        Remember - BRUSH YOUR TEETH THE MORNING OF SURGERY WITH YOUR REGULAR TOOTHPASTE  Do NOT smoke after Midnight the night before surgery.  STOP TAKING all Vitamins, Herbs and supplements 1 week before your surgery.   ELIQUIS - stop taking 72 hours before surgery, last dose will be taken on Monday 03-15-24  OZEMPIC -  last injection would have been on: March 09, 2024  Take ONLY these medicines the morning of surgery with A SIP OF WATER: Diltiazem , Flecainide , Triumeq ,  and you may use your Albuterol  inhaler if needed.    If You have been diagnosed with Sleep Apnea - Bring CPAP mask and tubing day of surgery. We will provide you with a CPAP machine on the day of your surgery.                   You may not have any metal on your body including  jewelry, and body piercing  Do not wear  lotions, powders,cologne, or deodorant  Men may shave face and neck.  Contacts, Hearing Aids, dentures or bridgework may not be worn into surgery. DENTURES WILL BE REMOVED PRIOR TO SURGERY PLEASE DO NOT APPLY Poly grip OR ADHESIVES!!!  Patients discharged on the day of surgery will not be allowed to drive home.  Someone NEEDS to stay with you for the first 24 hours  after anesthesia.  Do not bring your home medications to the hospital. The Pharmacy will dispense medications listed on your medication list to you during your admission in the Hospital.  Please read over the following fact sheets you were given: IF YOU HAVE QUESTIONS ABOUT YOUR PRE-OP  INSTRUCTIONS, PLEASE CALL [phone].     Pre-operative 5 CHG Bath Instructions   You can play a key role in reducing the risk of infection after surgery. Your skin needs to be as free of germs as possible. You can reduce the number of germs on your skin by washing with CHG (chlorhexidine  gluconate) soap before surgery. CHG is an antiseptic soap that kills germs and continues to kill germs even after washing.   DO NOT use if you have an allergy to chlorhexidine /CHG or antibacterial soaps. If your skin becomes reddened or irritated, stop using the CHG and notify one of our RNs at [phone]  Please shower with the CHG soap starting 4 days before surgery using the following schedule: START SHOWERS ON            [facility] March 15, 2024  Please keep in mind the following:  DO NOT shave, including legs and underarms, starting the day of your first shower.   You may shave your face at any point before/day of surgery.   Place clean sheets on your bed the day you start using CHG soap. Use a clean washcloth (not used since being washed) for each shower. DO NOT sleep with pets once you start using the CHG.   CHG Shower Instructions:  If you choose to wash your hair and private area, wash first with your normal shampoo/soap.  After you use shampoo/soap, rinse your hair and body thoroughly to remove shampoo/soap residue.  Turn the water OFF and apply about 3 tablespoons (45 ml) of CHG soap to a CLEAN washcloth.  Apply CHG soap ONLY FROM YOUR  NECK DOWN TO YOUR TOES (washing for 3-5 minutes)  DO NOT use CHG soap on face, private areas, open wounds, or sores.  Pay special attention to the area where your surgery is being performed.  If you are having back surgery, having someone wash your back for you may be helpful.  Wait 2 minutes after CHG soap is applied, then you may rinse off the CHG soap.  Pat dry with a clean towel  Put on clean clothes/pajamas   If you choose to wear lotion, please use ONLY the CHG-compatible lotions on the back of this paper.     Additional instructions for the day of surgery: DO NOT APPLY any lotions, deodorants, cologne, or perfumes.   Put on clean/comfortable clothes.  Brush your teeth.  Ask your nurse before applying any prescription medications to the skin.      CHG Compatible Lotions   Aveeno Moisturizing lotion  Cetaphil Moisturizing Cream  Cetaphil Moisturizing Lotion  Clairol Herbal Essence Moisturizing Lotion, Dry Skin  Clairol Herbal Essence Moisturizing Lotion, Extra Dry Skin  Clairol Herbal Essence Moisturizing Lotion, Normal Skin  Curel Age Defying Therapeutic Moisturizing Lotion with Alpha Hydroxy  Curel Extreme Care Body Lotion  Curel Soothing Hands Moisturizing Hand Lotion  Curel Therapeutic Moisturizing Cream, Fragrance-Free  Curel Therapeutic Moisturizing Lotion, Fragrance-Free  Curel Therapeutic Moisturizing Lotion, Original Formula  Eucerin Daily Replenishing Lotion  Eucerin Dry Skin Therapy Plus Alpha Hydroxy Crme  Eucerin Dry Skin Therapy Plus Alpha Hydroxy Lotion  Eucerin Original Crme  Eucerin Original Lotion  Eucerin Plus Crme Eucerin Plus Lotion  Eucerin TriLipid Replenishing Lotion  Keri Anti-Bacterial Hand Lotion  Keri Deep Conditioning Original Lotion Dry Skin Formula Softly Scented  Keri Deep Conditioning Original Lotion, Fragrance Free Sensitive Skin Formula  Keri Lotion Fast Absorbing Fragrance Free Sensitive Skin Formula  Keri Lotion Fast Absorbing  Softly Scented Dry Skin Formula  Keri Original Lotion  Keri Skin Renewal Lotion Keri Silky Smooth Lotion  Keri Silky Smooth Sensitive Skin Lotion  Nivea Body Creamy Conditioning Oil  Nivea Body Extra Enriched Lotion  Nivea Body Original Lotion  Nivea Body Sheer Moisturizing Lotion Nivea Crme  Nivea Skin Firming Lotion  NutraDerm 30 Skin Lotion  NutraDerm Skin Lotion  NutraDerm Therapeutic Skin Cream  NutraDerm Therapeutic Skin Lotion  ProShield Protective Hand Cream  Provon moisturizing lotion     Preparing for Total Shoulder Arthroplasty ================================================================= Please follow these instructions carefully, in addition to any other special Bathing information that was explained to you at the Presurgical Appointment:  BENZOYL PEROXIDE 5% GEL: Used to kill bacteria on the skin which could cause an infection at the surgery site.   Please do not use if you have an  allergy to benzoyl peroxide. If your skin becomes reddened/irritated stop using the benzoyl peroxide and inform your Doctor.   Starting two days before surgery, apply as follows:  1. Apply benzoyl peroxide gel in the morning and at night. Apply after taking a shower. If you are not taking a shower, clean entire shoulder front, back, and side, along with the armpit with a clean wet washcloth.  2. Place a quarter-sized dollop of the gel on your SHOULDER and rub in thoroughly, making sure to cover the front, back, and side of your shoulder, along with the armpit.   2 Days prior to Surgery   [facility]  03-17-24 First Application _______ Morning Second Application _______ Night  Day Before Surgery       THURSDAT  03-18-24            First Application______ Morning  On the night before surgery, wash your entire body (except hair, face and private areas) with CHG Soap. THEN, rub in the LAST application of the Benzoyl Peroxide Gel on your shoulder.   3. On the Morning of Surgery wash your  BODY AGAIN with CHG Soap (except hair, face and private areas)  4. DO NOT USE THE BENZOYL PEROXIDE GEL ON THE DAY OF YOUR SURGERY      FAILURE TO FOLLOW THESE INSTRUCTIONS MAY RESULT IN THE CANCELLATION OF YOUR SURGERY  PATIENT SIGNATURE_________________________________  NURSE SIGNATURE__________________________________  ________________________________________________________________________      Ethan Lambert    An incentive spirometer is a tool that can help keep your lungs clear and active. This tool measures how well you are filling your lungs with each breath. Taking long deep breaths may help reverse or decrease the chance of developing breathing (pulmonary) problems (especially infection) following: A long period of time when you are unable to move or be active. BEFORE THE PROCEDURE  If the spirometer includes an indicator to show your best effort, your nurse or respiratory therapist will set it to a desired goal. If possible, sit up straight or lean slightly forward. Try not to slouch. Hold the incentive spirometer in an upright position. INSTRUCTIONS FOR USE  Sit on the edge of your bed if possible, or sit up as far as you can in bed or on a chair. Hold the incentive spirometer in an upright position. Breathe out normally. Place the mouthpiece in your mouth and seal your lips tightly around it. Breathe in slowly and as deeply as possible, raising the piston or the ball toward the top of the column. Hold your breath for 3-5 seconds or for as long as possible. Allow the piston or ball to fall to the bottom of the column. Remove the mouthpiece from your mouth and breathe out normally. Rest for a few seconds and repeat Steps 1 through 7 at least 10 times every 1-2 hours when you are awake. Take your time and take a few normal breaths between deep breaths. The spirometer may include an indicator to show your best effort. Use the indicator as a goal to work toward  during each repetition. After each set of 10 deep breaths, practice coughing to be sure your lungs are clear. If you have an incision (the cut made at the time of surgery), support your incision when coughing by placing a pillow or rolled up towels firmly against it. Once you are able to get out of bed, walk around indoors and cough well. You may stop using the incentive spirometer when instructed by your caregiver.  RISKS  AND COMPLICATIONS Take your time so you do not get dizzy or light-headed. If you are in pain, you may need to take or ask for pain medication before doing incentive spirometry. It is harder to take a deep breath if you are having pain. AFTER USE Rest and breathe slowly and easily. It can be helpful to keep track of a log of your progress. Your caregiver can provide you with a simple table to help with this. If you are using the spirometer at home, follow these instructions: SEEK MEDICAL CARE IF:  You are having difficultly using the spirometer. You have trouble using the spirometer as often as instructed. Your pain medication is not giving enough relief while using the spirometer. You develop fever of 100.5 F (38.1 C) or higher.                                                                                                    SEEK IMMEDIATE MEDICAL CARE IF:  You cough up bloody sputum that had not been present before. You develop fever of 102 F (38.9 C) or greater. You develop worsening pain at or near the incision site. MAKE SURE YOU:  Understand these instructions. Will watch your condition. Will get help right away if you are not doing well or get worse. Document Released: 10/21/2006 Document Revised: 09/02/2011 Document Reviewed: 12/22/2006 [facility] Patient Information 2014 [place], [place].        If you would like to see a video about joint replacement:    [email or web address]

## 2024-03-09 NOTE — Progress Notes (Signed)
 COVID Vaccine received:  []  No [x]  Yes Date of any COVID positive Test in last 90 days:  PCP - Beverley Hummer, MD clearance scanned in Media ID- Lamar Bucks, MD  Cardiologist - Stanly Leavens, MD (LOV 01-05-24) Damien Braver, NP 11-19-23 cardiac clearance EP- Eulas Furbish, MD (LOV 02-06-24)  Chest x-ray - 04-18-2022  1v  Epic EKG -  02-06-24  Epic Stress Test - Lexiscan  08-28-2020  Epic ECHO - 12-01-2023  Epic Cardiac Cath -  CT Coronary Calcium  score: 301 on 10-05-2020 Epic  Pacemaker / ICD device [x]  No []  Yes   Spinal Cord Stimulator:[x]  No []  Yes       History of Sleep Apnea? []  No [x]  Yes  2024 study CPAP used?- []  No [x]  Yes    Patient has: []  NO Hx DM   [x]  Pre-DM   []  DM1  []   DM2 Does the patient monitor blood sugar?   []  N/A   []  No []  Yes  Last A1c was:  6.9  on    12-02-23  Does patient have a [employer] or Dexcom? []  No []  Yes   Fasting Blood Sugar Ranges-  Checks Blood Sugar _____ times a day OZEMPIC - Hold x 8-14 days  Blood Thinner / Instructions:  Eliquis   Hold x 72 hours last dose: 03-15-24  Aspirin Instructions:  ERAS Protocol Ordered: []  No  [x]  Yes PRE-SURGERY []  ENSURE  [x]  G2 Patient is to be NPO after: 0730  Dental hx: []  Dentures:  []  N/A      []  Bridge or Partial:                   []  Loose or Damaged teeth:   Comments: The patient was given Benzoyl peroxide Gel as ordered. Instruction regarding application starting 2 days prior to surgery was given and patient voiced understanding.   Patient was given the 5 CHG shower / bath instructions for Reverse Shoulder arthroplasty surgery along with 2 bottles of the CHG soap. Patient will start this on:   Monday 03-15-2024         Activity level: Able to walk up 2 flights of stairs without becoming significantly short of breath or having chest pain?  []  No   []    Yes  Patient can perform ADLs without assistance. []  No   []   Yes  Anesthesia review: HIV, A.Fib- ablation 2019, s/p CVA, Pre-DM, OSA-CPAP,  HTN   Patient denies any S&S of respiratory illness or Covid - no shortness of breath, fever, cough or chest pain at PAT appointment.  Patient verbalized understanding and agreement to the Pre-Surgical Instructions that were given to them at this PAT appointment. Patient was also educated of the need to review these PAT instructions again prior to his surgery.I reviewed the appropriate phone numbers to call if they have any and questions or concerns.

## 2024-03-10 ENCOUNTER — Encounter (HOSPITAL_COMMUNITY)
Admission: RE | Admit: 2024-03-10 | Discharge: 2024-03-10 | Disposition: A | Discharge status: Home / Self Care | Source: Ambulatory Visit | Attending: Orthopedic Surgery | Admitting: Orthopedic Surgery

## 2024-03-10 ENCOUNTER — Other Ambulatory Visit: Payer: Self-pay

## 2024-03-10 ENCOUNTER — Encounter (HOSPITAL_COMMUNITY): Payer: Self-pay

## 2024-03-10 VITALS — BP 124/72 | HR 56 | Temp 98.9°F | Resp 16 | Ht 71.5 in | Wt 270.0 lb

## 2024-03-10 DIAGNOSIS — Z01812 Encounter for preprocedural laboratory examination: Secondary | ICD-10-CM | POA: Diagnosis not present

## 2024-03-10 DIAGNOSIS — I10 Essential (primary) hypertension: Secondary | ICD-10-CM | POA: Insufficient documentation

## 2024-03-10 DIAGNOSIS — I2510 Atherosclerotic heart disease of native coronary artery without angina pectoris: Secondary | ICD-10-CM | POA: Diagnosis not present

## 2024-03-10 DIAGNOSIS — I480 Paroxysmal atrial fibrillation: Secondary | ICD-10-CM | POA: Diagnosis not present

## 2024-03-10 DIAGNOSIS — I4510 Unspecified right bundle-branch block: Secondary | ICD-10-CM | POA: Insufficient documentation

## 2024-03-10 DIAGNOSIS — Z79899 Other long term (current) drug therapy: Secondary | ICD-10-CM

## 2024-03-10 DIAGNOSIS — R7303 Prediabetes: Secondary | ICD-10-CM

## 2024-03-10 DIAGNOSIS — G4730 Sleep apnea, unspecified: Secondary | ICD-10-CM | POA: Insufficient documentation

## 2024-03-10 DIAGNOSIS — Z21 Asymptomatic human immunodeficiency virus [HIV] infection status: Secondary | ICD-10-CM | POA: Diagnosis not present

## 2024-03-10 DIAGNOSIS — M19011 Primary osteoarthritis, right shoulder: Secondary | ICD-10-CM | POA: Insufficient documentation

## 2024-03-10 DIAGNOSIS — Z8774 Personal history of (corrected) congenital malformations of heart and circulatory system: Secondary | ICD-10-CM | POA: Diagnosis not present

## 2024-03-10 DIAGNOSIS — Z01818 Encounter for other preprocedural examination: Secondary | ICD-10-CM

## 2024-03-10 DIAGNOSIS — Z8673 Personal history of transient ischemic attack (TIA), and cerebral infarction without residual deficits: Secondary | ICD-10-CM | POA: Diagnosis not present

## 2024-03-10 HISTORY — DX: Prediabetes: R73.03

## 2024-03-10 HISTORY — DX: Unspecified asthma, uncomplicated: J45.909

## 2024-03-10 HISTORY — DX: Pneumonia, unspecified organism: J18.9

## 2024-03-10 HISTORY — DX: Other specified postprocedural states: Z98.890

## 2024-03-10 HISTORY — DX: Myoneural disorder, unspecified: G70.9

## 2024-03-10 HISTORY — DX: Patent foramen ovale: Q21.12

## 2024-03-10 HISTORY — DX: Gastro-esophageal reflux disease without esophagitis: K21.9

## 2024-03-10 LAB — COMPREHENSIVE METABOLIC PANEL WITH GFR
ALT: 27 U/L (ref 0–44)
AST: 30 U/L (ref 15–41)
Albumin: 4.4 g/dL (ref 3.5–5.0)
Alkaline Phosphatase: 80 U/L (ref 38–126)
Anion gap: 12 (ref 5–15)
BUN: 14 mg/dL (ref 8–23)
CO2: 22 mmol/L (ref 22–32)
Calcium: 9.6 mg/dL (ref 8.9–10.3)
Chloride: 105 mmol/L (ref 98–111)
Creatinine, Ser: 0.9 mg/dL (ref 0.61–1.24)
GFR, Estimated: >60 mL/min (ref >60)
Glucose, Bld: 111 mg/dL — ABNORMAL HIGH (ref 70–99)
Potassium: 4.1 mmol/L (ref 3.5–5.1)
Sodium: 139 mmol/L (ref 135–145)
Total Bilirubin: 0.9 mg/dL (ref 0.0–1.2)
Total Protein: 7.9 g/dL (ref 6.5–8.1)

## 2024-03-10 LAB — CBC
HCT: 45.5 % (ref 39.0–52.0)
Hemoglobin: 14.4 g/dL (ref 13.0–17.0)
MCH: 30 pg (ref 26.0–34.0)
MCHC: 31.6 g/dL (ref 30.0–36.0)
MCV: 94.8 fL (ref 80.0–100.0)
Platelets: 252 K/uL (ref 150–400)
RBC: 4.8 MIL/uL (ref 4.22–5.81)
RDW: 13.9 % (ref 11.5–15.5)
WBC: 6.7 K/uL (ref 4.0–10.5)
nRBC: 0 % (ref 0.0–0.2)

## 2024-03-10 LAB — SURGICAL PCR SCREEN
MRSA, PCR: NEGATIVE
Staphylococcus aureus: POSITIVE — AB

## 2024-03-10 NOTE — Progress Notes (Signed)
 Patient's PCR screen is positive for STAPH. Appropriate notes have been placed on the patient's chart. This note has been routed to Dr. Sharl and Dayle Moores, PA for review. The Patient's surgery is currently scheduled for: 03-19-2024 at [facility].  Shawnee Aloe, BSN, CVRN-BC   Pre-Surgical Testing Nurse [facility]  [phone]

## 2024-03-11 NOTE — Progress Notes (Signed)
 Anesthesia Chart Review   Case: [id number] Date/Time: 03/19/24 1027   Procedure: ARTHROPLASTY, SHOULDER, TOTAL, REVERSE (Right: Shoulder)   Anesthesia type: Choice   Pre-op  diagnosis: Right shoulder rotator cuff arthropathy   Location: [facility] ROOM 07 / [facility]   Surgeons: [name], MD       DISCUSSION:63 y.o. never smoker with h/o PONV, sleep apnea, HTN, HIV, paroxysmal a-fib s/p ablation, RBBB, nonobstructive CAD, CVA, PFO, right shoulder shoulder rotator cuff arthropathy scheduled for above procedure 03/19/2024 with Dr. Selinda Dover Sharl.   Pt last seen by pulmonology 03/08/2024. Stable at this visit, off maintenance inhaler, uses albuterol  inhaler prn.   Pt last sen by cardiology 01/05/24. Stable at this visit, good functional status. Per OV note, reasonable to proceed for non-cardiac surgery  Pt advised to hold Ozempic  1 week prior to procedure.  VS: BP 124/72 Comment: right arm sitting  Pulse (!) 56   Temp 37.2 C (Oral)   Resp 16   Ht 5' 11.5 (1.816 m)   Wt 122.5 kg   SpO2 97%   BMI 37.13 kg/m   PROVIDERS: Sebastian Beverley NOVAK, MD is PCP   Cardiologist - Stanly Leavens, MD   EP- Eulas Furbish, MD   ID- Lamar Bucks, MD  LABS: Labs reviewed: Acceptable for surgery. (all labs ordered are listed, but only abnormal results are displayed)  Labs Reviewed  SURGICAL PCR SCREEN - Abnormal; Notable for the following components:      Result Value   Staphylococcus aureus POSITIVE (*)    All other components within normal limits  COMPREHENSIVE METABOLIC PANEL WITH GFR - Abnormal; Notable for the following components:   Glucose, Bld 111 (*)    All other components within normal limits  CBC     IMAGES:   EKG:   CV: Echo 11/30/2021  1. Left ventricular ejection fraction, by estimation, is 65 to 70%. The  left ventricle has normal function. The left ventricle has no regional  wall motion abnormalities. There is severe asymmetric left ventricular   hypertrophy of the septal segment (16  mm). Left ventricular diastolic parameters were normal. The average left  ventricular global longitudinal strain is -22.8 %. The global longitudinal  strain is normal.   2. Right ventricular systolic function is normal. The right ventricular  size is mildly enlarged. There is normal pulmonary artery systolic  pressure. The estimated right ventricular systolic pressure is 24.9 mmHg.   3. The mitral valve is grossly normal. Mild mitral valve regurgitation.  No evidence of mitral stenosis.   4. The aortic valve is abnormal. Unable to determine aortic valve  morphology, indeterminate number of cusps. There is severe calcifcation of  the aortic valve primarily along noncoronary cusp. Aortic valve  regurgitation is mild. Aortic valve  sclerosis/calcification is present, without any evidence of aortic  stenosis.   5. Aortic dilatation noted. There is borderline dilatation of the aortic  root, measuring 37 mm.   6. The inferior vena cava is normal in size with greater than 50%  respiratory variability, suggesting right atrial pressure of 3 mmHg.   7. Cannot exclude a small PFO with left to right shunt. Rightward bowing  of atrial septum. Consider agitated saline exam if clinically indicated.   Myocardial Perfusion 08/28/2020 Nuclear stress EF: 60%. No T wave inversion was noted during stress. There was no ST segment deviation noted during stress. Defect 1: There is a medium defect of moderate severity present in the apical septal, apical lateral and apex  location. This is a low risk study.   Medium size, moderate intensity fixed apical, apical septal and apical lateral perfusion defect, suspicious for artifact. No significant reversible ischemia. LVEF 60% with normal wall motion. This is a low risk study. No prior for comparison. Past Medical History:  Diagnosis Date   Anemia    Arthritis    Asthma    Clotting disorder (HCC)    CVA (cerebral vascular  accident) (HCC) 1999 X 2   LEFT FRONTAL & OCCIPITAL , NON-HEMORRHAGIC;  left sided weakness   Dysrhythmia    A. fib   Family history of malignant neoplasm of gastrointestinal tract    GERD (gastroesophageal reflux disease)    Heart murmur    History of blood transfusion 1999   related to the stroke   History of kidney stones    HIV disease (HCC) dx 1999   MONITORED BY INFECTIOUS DISEASE -- DR LAMAR COMER   Hypertension    Neuromuscular disorder (HCC)    tingling burning in both feet , takes Vit B-12   Paroxysmal atrial fibrillation (HCC)    EP -- Dr. Nancey   PFO (patent foramen ovale)    Hx of PFO   Pneumonia    PONV (postoperative nausea and vomiting)    Pre-diabetes    Right knee meniscal tear    Seizures (HCC)    1999   Skin cancer    skin   Sleep apnea    on CPAP    Past Surgical History:  Procedure Laterality Date   APPENDECTOMY  06/09/2003   ATRIAL FIBRILLATION ABLATION N/A 05/28/2018   Procedure: ATRIAL FIBRILLATION ABLATION;  Surgeon: Kelsie Agent, MD;  [facility]: [facility];  Service: Cardiovascular;  Laterality: N/A;   INGUINAL HERNIA REPAIR Bilateral 1990s   KNEE ARTHROSCOPY WITH MEDIAL MENISECTOMY Right 12/31/2013   Procedure: RIGHT KNEE ARTHROSCOPY PARTIAL MEDIAL MENISCECTOMY DEBRIDEMENT AND CHONDROPLASTY ;  Surgeon: LAMAR Collet, MD;  Location: [facility];  Service: Orthopedics;  Laterality: Right;   KNEE ARTHROSCOPY WITH MEDIAL MENISECTOMY Left 01/27/2019   Procedure: LEFT KNEE ARTHROSCOPY WITH PARTIAL MEDIAL MENISCECTOMY;  Surgeon: Jerri Kay HERO, MD;  Location: [facility];  Service: Orthopedics;  Laterality: Left;   LAPAROSCOPIC CHOLECYSTECTOMY  07/08/2003   LASER ABLATION ANAL CONDYLOMA  05/18/2001   LUMBAR LAMINECTOMY/DECOMPRESSION MICRODISCECTOMY Left 03/15/2015   Procedure: Left Lumbar five- sacral one microdiskectomy;  Surgeon: Gerldine Maizes, MD;  Location: [facility];  Service: Neurosurgery;   Laterality: Left;  Left L5S1 microdiskectomy   SKIN CANCER EXCISION Right    cut it off my shoulder   TRANSTHORACIC ECHOCARDIOGRAM  12/01/2008   MODERATE LVH/  EF 55-60%/  MILD MR/  NEGATIVE BUBBLE STUDY    MEDICATIONS:  abacavir -dolutegravir -lamiVUDine  (TRIUMEQ ) 600-50-300 MG tablet   albuterol  (VENTOLIN  HFA) 108 (90 Base) MCG/ACT inhaler   cyanocobalamin  (VITAMIN B12) 1000 MCG tablet   diltiazem  (CARDIZEM  CD) 240 MG 24 hr capsule   diltiazem  (CARDIZEM ) 30 MG tablet   ELIQUIS  5 MG TABS tablet   Evolocumab  (REPATHA  SURECLICK) 140 MG/ML SOAJ   flecainide  (TAMBOCOR ) 50 MG tablet   Semaglutide ,0.25 or 0.5MG /DOS, (OZEMPIC , 0.25 OR 0.5 MG/DOSE,) 2 MG/3ML SOPN   No current facility-administered medications for this encounter.    Harlene Hoots Ward, PA-C [facility] Pre-Surgical Testing [phone]

## 2024-03-11 NOTE — Anesthesia Preprocedure Evaluation (Addendum)
 Anesthesia Evaluation  Patient identified by MRN, date of birth, ID band Patient awake    Reviewed: Allergy & Precautions, NPO status , Patient's Chart, lab work & pertinent test results  History of Anesthesia Complications (+) PONV and history of anesthetic complications  Airway Mallampati: II  TM Distance: >3 FB     Dental no notable dental hx.    Pulmonary asthma , sleep apnea    Pulmonary exam normal        Cardiovascular hypertension, + dysrhythmias Atrial Fibrillation  Rhythm:Irregular Rate:Normal     Neuro/Psych Seizures -,  CVA, No Residual Symptoms    GI/Hepatic Neg liver ROS,GERD  ,,  Endo/Other  diabetes    Renal/GU   negative genitourinary   Musculoskeletal  (+) Arthritis , Osteoarthritis,    Abdominal Normal abdominal exam  (+)   Peds  Hematology  (+) Blood dyscrasia, anemia   Anesthesia Other Findings   Reproductive/Obstetrics                              Anesthesia Physical Anesthesia Plan  ASA: 3  Anesthesia Plan: General and Regional   Post-op Pain Management:    Induction: Intravenous  PONV Risk Score and Plan: 3 and Ondansetron , Dexamethasone , Treatment may vary due to age or medical condition and Midazolam   Airway Management Planned: Mask and Oral ETT  Additional Equipment: None  Intra-op Plan:   Post-operative Plan: Extubation in OR  Informed Consent: I have reviewed the patients History and Physical, chart, labs and discussed the procedure including the risks, benefits and alternatives for the proposed anesthesia with the patient or authorized representative who has indicated his/her understanding and acceptance.     Dental advisory given  Plan Discussed with: CRNA  Anesthesia Plan Comments: (See PAT note 03/10/24)         Anesthesia Quick Evaluation

## 2024-03-19 ENCOUNTER — Ambulatory Visit (HOSPITAL_COMMUNITY): Admitting: Certified Registered Nurse Anesthetist

## 2024-03-19 ENCOUNTER — Ambulatory Visit (HOSPITAL_COMMUNITY): Payer: Self-pay | Admitting: Medical

## 2024-03-19 ENCOUNTER — Encounter (HOSPITAL_COMMUNITY): Payer: Self-pay | Admitting: Orthopedic Surgery

## 2024-03-19 ENCOUNTER — Ambulatory Visit (HOSPITAL_COMMUNITY)
Admission: RE | Admit: 2024-03-19 | Discharge: 2024-03-19 | Disposition: A | Discharge status: Home / Self Care | Attending: Orthopedic Surgery | Admitting: Orthopedic Surgery

## 2024-03-19 ENCOUNTER — Encounter (HOSPITAL_COMMUNITY)
Admission: RE | Disposition: A | Discharge status: Home / Self Care | Payer: Self-pay | Source: Home / Self Care | Attending: Orthopedic Surgery

## 2024-03-19 ENCOUNTER — Other Ambulatory Visit: Payer: Self-pay

## 2024-03-19 ENCOUNTER — Ambulatory Visit (HOSPITAL_COMMUNITY)

## 2024-03-19 DIAGNOSIS — Z79899 Other long term (current) drug therapy: Secondary | ICD-10-CM | POA: Insufficient documentation

## 2024-03-19 DIAGNOSIS — M1990 Unspecified osteoarthritis, unspecified site: Secondary | ICD-10-CM | POA: Insufficient documentation

## 2024-03-19 DIAGNOSIS — E119 Type 2 diabetes mellitus without complications: Secondary | ICD-10-CM | POA: Insufficient documentation

## 2024-03-19 DIAGNOSIS — K219 Gastro-esophageal reflux disease without esophagitis: Secondary | ICD-10-CM | POA: Insufficient documentation

## 2024-03-19 DIAGNOSIS — M12811 Other specific arthropathies, not elsewhere classified, right shoulder: Secondary | ICD-10-CM | POA: Diagnosis not present

## 2024-03-19 DIAGNOSIS — Z8673 Personal history of transient ischemic attack (TIA), and cerebral infarction without residual deficits: Secondary | ICD-10-CM | POA: Insufficient documentation

## 2024-03-19 DIAGNOSIS — Z7985 Long-term (current) use of injectable non-insulin antidiabetic drugs: Secondary | ICD-10-CM | POA: Diagnosis not present

## 2024-03-19 DIAGNOSIS — D649 Anemia, unspecified: Secondary | ICD-10-CM | POA: Diagnosis not present

## 2024-03-19 DIAGNOSIS — M19011 Primary osteoarthritis, right shoulder: Secondary | ICD-10-CM | POA: Insufficient documentation

## 2024-03-19 DIAGNOSIS — I10 Essential (primary) hypertension: Secondary | ICD-10-CM | POA: Diagnosis not present

## 2024-03-19 DIAGNOSIS — G8918 Other acute postprocedural pain: Secondary | ICD-10-CM | POA: Diagnosis not present

## 2024-03-19 DIAGNOSIS — G4730 Sleep apnea, unspecified: Secondary | ICD-10-CM

## 2024-03-19 DIAGNOSIS — I480 Paroxysmal atrial fibrillation: Secondary | ICD-10-CM | POA: Insufficient documentation

## 2024-03-19 DIAGNOSIS — Z96611 Presence of right artificial shoulder joint: Secondary | ICD-10-CM | POA: Diagnosis not present

## 2024-03-19 DIAGNOSIS — Z79624 Long term (current) use of inhibitors of nucleotide synthesis: Secondary | ICD-10-CM | POA: Diagnosis not present

## 2024-03-19 DIAGNOSIS — J45909 Unspecified asthma, uncomplicated: Secondary | ICD-10-CM

## 2024-03-19 DIAGNOSIS — Z471 Aftercare following joint replacement surgery: Secondary | ICD-10-CM | POA: Diagnosis not present

## 2024-03-19 DIAGNOSIS — Z7901 Long term (current) use of anticoagulants: Secondary | ICD-10-CM | POA: Diagnosis not present

## 2024-03-19 DIAGNOSIS — Z7951 Long term (current) use of inhaled steroids: Secondary | ICD-10-CM | POA: Insufficient documentation

## 2024-03-19 DIAGNOSIS — R569 Unspecified convulsions: Secondary | ICD-10-CM | POA: Insufficient documentation

## 2024-03-19 DIAGNOSIS — M75101 Unspecified rotator cuff tear or rupture of right shoulder, not specified as traumatic: Secondary | ICD-10-CM | POA: Insufficient documentation

## 2024-03-19 HISTORY — PX: REVERSE SHOULDER ARTHROPLASTY: SHX5054

## 2024-03-19 LAB — GLUCOSE, CAPILLARY: Glucose-Capillary: 129 mg/dL — ABNORMAL HIGH (ref 70–99)

## 2024-03-19 SURGERY — ARTHROPLASTY, SHOULDER, TOTAL, REVERSE
Anesthesia: Choice | Site: Shoulder | Laterality: Right

## 2024-03-19 MED ORDER — ONDANSETRON 4 MG PO TBDP: 4.0000 mg | ORAL_TABLET | Freq: Three times a day (TID) | ORAL | 0 refills | Status: AC | PRN

## 2024-03-19 MED ORDER — CHLORHEXIDINE GLUCONATE 0.12 % MT SOLN
15.0000 mL | Freq: Once | OROMUCOSAL | Status: AC
  Administered 2024-03-19: 15 mL via OROMUCOSAL

## 2024-03-19 MED ORDER — FENTANYL CITRATE (PF) 100 MCG/2ML IJ SOLN
INTRAMUSCULAR | Status: AC
  Filled 2024-03-19: qty 2

## 2024-03-19 MED ORDER — LIDOCAINE HCL (PF) 2 % IJ SOLN
INTRAMUSCULAR | Status: AC
  Filled 2024-03-19: qty 5

## 2024-03-19 MED ORDER — CEFAZOLIN SODIUM-DEXTROSE 2-4 GM/100ML-% IV SOLN
2.0000 g | INTRAVENOUS | Status: AC
  Administered 2024-03-19: 2 g via INTRAVENOUS
  Filled 2024-03-19: qty 100

## 2024-03-19 MED ORDER — SUGAMMADEX SODIUM 200 MG/2ML IV SOLN
INTRAVENOUS | Status: DC | PRN
  Administered 2024-03-19: 200 mg via INTRAVENOUS

## 2024-03-19 MED ORDER — ROCURONIUM BROMIDE 10 MG/ML (PF) SYRINGE
PREFILLED_SYRINGE | INTRAVENOUS | Status: AC
  Filled 2024-03-19: qty 10

## 2024-03-19 MED ORDER — MIDAZOLAM HCL 2 MG/2ML IJ SOLN
1.0000 mg | INTRAMUSCULAR | Status: AC
  Administered 2024-03-19: 2 mg via INTRAVENOUS
  Filled 2024-03-19: qty 2

## 2024-03-19 MED ORDER — DEXAMETHASONE SODIUM PHOSPHATE 4 MG/ML IJ SOLN
INTRAMUSCULAR | Status: DC | PRN
  Administered 2024-03-19: 5 mg via INTRAVENOUS

## 2024-03-19 MED ORDER — SUGAMMADEX SODIUM 200 MG/2ML IV SOLN
INTRAVENOUS | Status: AC
  Filled 2024-03-19: qty 2

## 2024-03-19 MED ORDER — PROPOFOL 10 MG/ML IV BOLUS
INTRAVENOUS | Status: DC | PRN
  Administered 2024-03-19: 170 mg via INTRAVENOUS

## 2024-03-19 MED ORDER — ONDANSETRON HCL 4 MG/2ML IJ SOLN
INTRAMUSCULAR | Status: AC
  Filled 2024-03-19: qty 2

## 2024-03-19 MED ORDER — OXYCODONE HCL 5 MG PO TABS: 5.0000 mg | ORAL_TABLET | ORAL | 0 refills | Status: AC | PRN

## 2024-03-19 MED ORDER — BUPIVACAINE HCL 0.5 % IJ SOLN
INTRAMUSCULAR | Status: DC | PRN
  Administered 2024-03-19: 10 mL

## 2024-03-19 MED ORDER — BUPIVACAINE LIPOSOME 1.3 % IJ SUSP
INTRAMUSCULAR | Status: DC | PRN
  Administered 2024-03-19: 10 mL via PERINEURAL

## 2024-03-19 MED ORDER — VANCOMYCIN HCL 1000 MG IV SOLR
INTRAVENOUS | Status: AC
  Filled 2024-03-19: qty 20

## 2024-03-19 MED ORDER — PROPOFOL 10 MG/ML IV BOLUS
INTRAVENOUS | Status: AC
  Filled 2024-03-19: qty 20

## 2024-03-19 MED ORDER — SUCCINYLCHOLINE CHLORIDE 200 MG/10ML IV SOSY
PREFILLED_SYRINGE | INTRAVENOUS | Status: AC
  Filled 2024-03-19: qty 10

## 2024-03-19 MED ORDER — FENTANYL CITRATE (PF) 100 MCG/2ML IJ SOLN
INTRAMUSCULAR | Status: DC | PRN
  Administered 2024-03-19: 100 ug via INTRAVENOUS

## 2024-03-19 MED ORDER — ONDANSETRON HCL 4 MG/2ML IJ SOLN
INTRAMUSCULAR | Status: DC | PRN
Start: 2024-03-19 — End: 2024-03-19
  Administered 2024-03-19: 4 mg via INTRAVENOUS

## 2024-03-19 MED ORDER — PHENYLEPHRINE HCL-NACL 20-0.9 MG/250ML-% IV SOLN
INTRAVENOUS | Status: DC | PRN
  Administered 2024-03-19: 30 ug/min via INTRAVENOUS

## 2024-03-19 MED ORDER — DROPERIDOL 2.5 MG/ML IJ SOLN
INTRAMUSCULAR | Status: AC
Start: 2024-03-19 — End: 2024-03-19
  Filled 2024-03-19: qty 2

## 2024-03-19 MED ORDER — ORAL CARE MOUTH RINSE: 15.0000 mL | Freq: Once | OROMUCOSAL | Status: AC

## 2024-03-19 MED ORDER — ROCURONIUM BROMIDE 10 MG/ML (PF) SYRINGE
PREFILLED_SYRINGE | INTRAVENOUS | Status: DC | PRN
  Administered 2024-03-19: 20 mg via INTRAVENOUS
  Administered 2024-03-19: 60 mg via INTRAVENOUS

## 2024-03-19 MED ORDER — DEXAMETHASONE SODIUM PHOSPHATE 10 MG/ML IJ SOLN
INTRAMUSCULAR | Status: AC
  Filled 2024-03-19: qty 1

## 2024-03-19 MED ORDER — 0.9 % SODIUM CHLORIDE (POUR BTL) OPTIME
TOPICAL | Status: DC | PRN
  Administered 2024-03-19: 1000 mL

## 2024-03-19 MED ORDER — INSULIN ASPART 100 UNIT/ML IJ SOLN: 0.0000 [IU] | INTRAMUSCULAR | Status: DC | PRN

## 2024-03-19 MED ORDER — ACETAMINOPHEN 10 MG/ML IV SOLN
INTRAVENOUS | Status: AC
  Filled 2024-03-19: qty 100

## 2024-03-19 MED ORDER — FENTANYL CITRATE PF 50 MCG/ML IJ SOSY
PREFILLED_SYRINGE | INTRAMUSCULAR | Status: AC
  Filled 2024-03-19: qty 1

## 2024-03-19 MED ORDER — ACETAMINOPHEN 10 MG/ML IV SOLN
1000.0000 mg | Freq: Once | INTRAVENOUS | Status: DC | PRN
  Administered 2024-03-19: 1000 mg via INTRAVENOUS

## 2024-03-19 MED ORDER — FENTANYL CITRATE PF 50 MCG/ML IJ SOSY
25.0000 ug | PREFILLED_SYRINGE | INTRAMUSCULAR | Status: DC | PRN
  Administered 2024-03-19 (×2): 25 ug via INTRAVENOUS

## 2024-03-19 MED ORDER — ESMOLOL HCL 100 MG/10ML IV SOLN
INTRAVENOUS | Status: DC | PRN
  Administered 2024-03-19: 30 mg via INTRAVENOUS

## 2024-03-19 MED ORDER — VANCOMYCIN HCL 1000 MG IV SOLR
INTRAVENOUS | Status: DC | PRN
  Administered 2024-03-19: 1000 mg via TOPICAL

## 2024-03-19 MED ORDER — STERILE WATER FOR IRRIGATION IR SOLN
Status: DC | PRN
  Administered 2024-03-19: 1000 mL

## 2024-03-19 MED ORDER — FENTANYL CITRATE PF 50 MCG/ML IJ SOSY
50.0000 ug | PREFILLED_SYRINGE | INTRAMUSCULAR | Status: AC
  Administered 2024-03-19: 50 ug via INTRAVENOUS
  Filled 2024-03-19: qty 2

## 2024-03-19 MED ORDER — LACTATED RINGERS IV SOLN: INTRAVENOUS | Status: DC

## 2024-03-19 MED ORDER — LIDOCAINE HCL (PF) 2 % IJ SOLN
INTRAMUSCULAR | Status: DC | PRN
  Administered 2024-03-19: 100 mg via INTRADERMAL

## 2024-03-19 MED ORDER — DROPERIDOL 2.5 MG/ML IJ SOLN
0.6250 mg | Freq: Once | INTRAMUSCULAR | Status: AC | PRN
  Administered 2024-03-19: 0.625 mg via INTRAVENOUS

## 2024-03-19 MED ORDER — ISOPROPYL ALCOHOL 70 % SOLN
Status: DC | PRN
  Administered 2024-03-19: 1 via TOPICAL

## 2024-03-19 MED ORDER — TRANEXAMIC ACID-NACL 1000-0.7 MG/100ML-% IV SOLN
1000.0000 mg | INTRAVENOUS | Status: AC
  Administered 2024-03-19: 1000 mg via INTRAVENOUS
  Filled 2024-03-19: qty 100

## 2024-03-19 SURGICAL SUPPLY — 58 items
BAG COUNTER SPONGE SURGICOUNT (BAG) IMPLANT
BAG ZIPLOCK 12X15 (MISCELLANEOUS) ×2 IMPLANT
BASEPLATE AUG MED W-TAPER (Plate) IMPLANT
BIT DRILL 2.7 W/STOP DISP (BIT) IMPLANT
BIT DRILL TWIST 2.7 (BIT) IMPLANT
BLADE SAG 18X100X1.27 (BLADE) ×2 IMPLANT
CEMENT BONE DEPUY (Cement) IMPLANT
COMP HUM UNI IDENTI -6 EXT (Joint) IMPLANT
COMP HUM UNI IDENTI 36 +0 (Joint) IMPLANT
COOLER ICEMAN CLASSIC (MISCELLANEOUS) ×2 IMPLANT
COVER BACK TABLE 60X90IN (DRAPES) ×2 IMPLANT
COVER SURGICAL LIGHT HANDLE (MISCELLANEOUS) ×2 IMPLANT
DRAPE POUCH INSTRU U-SHP 10X18 (DRAPES) ×2 IMPLANT
DRAPE SHEET LG 3/4 BI-LAMINATE (DRAPES) ×2 IMPLANT
DRAPE SURG 17X11 SM STRL (DRAPES) ×2 IMPLANT
DRAPE SURG ORHT 6 SPLT 77X108 (DRAPES) ×4 IMPLANT
DRAPE TOP 10253 STERILE (DRAPES) ×2 IMPLANT
DRAPE U-SHAPE 47X51 STRL (DRAPES) ×2 IMPLANT
DRSG AQUACEL AG ADV 3.5X 6 (GAUZE/BANDAGES/DRESSINGS) IMPLANT
DRSG AQUACEL AG ADV 3.5X10 (GAUZE/BANDAGES/DRESSINGS) IMPLANT
DURAPREP 26ML APPLICATOR (WOUND CARE) ×2 IMPLANT
ELECT PENCIL ROCKER SW 15FT (MISCELLANEOUS) ×2 IMPLANT
ELECT REM PT RETURN 15FT ADLT (MISCELLANEOUS) ×2 IMPLANT
FACESHIELD WRAPAROUND OR TEAM (MASK) ×2 IMPLANT
GLENOID SPHERE 36MM CVD +3 (Orthopedic Implant) IMPLANT
GLOVE BIO SURGEON STRL SZ7.5 (GLOVE) ×8 IMPLANT
GLOVE BIOGEL PI IND STRL 8 (GLOVE) ×4 IMPLANT
GOWN STRL REUS W/ TWL XL LVL3 (GOWN DISPOSABLE) ×4 IMPLANT
KIT BASIN OR (CUSTOM PROCEDURE TRAY) ×2 IMPLANT
KIT TURNOVER KIT A (KITS) ×2 IMPLANT
MANIFOLD NEPTUNE II (INSTRUMENTS) ×2 IMPLANT
NDL TAPERED W/ NITINOL LOOP (MISCELLANEOUS) IMPLANT
NEEDLE TAPERED W/ NITINOL LOOP (MISCELLANEOUS) IMPLANT
NS IRRIG 1000ML POUR BTL (IV SOLUTION) ×2 IMPLANT
PACK SHOULDER (CUSTOM PROCEDURE TRAY) ×2 IMPLANT
PAD COLD SHLDR WRAP-ON (PAD) ×2 IMPLANT
PIN THREADED REVERSE (PIN) IMPLANT
REAMER GUIDE BUSHING SURG DISP (MISCELLANEOUS) IMPLANT
REAMER GUIDE W/SCREW AUG (MISCELLANEOUS) IMPLANT
RESTRAINT HEAD UNIVERSAL NS (MISCELLANEOUS) ×2 IMPLANT
SCREW BONE CORT 6.5X35MM (Screw) IMPLANT
SCREW BONE LOCKING 4.75X35X3.5 (Screw) IMPLANT
SCREW LOCKING 4.75MMX15MM (Screw) IMPLANT
SCREW LOCKING NS 4.75MMX20MM (Screw) IMPLANT
SLING ARM IMMOBILIZER LRG (SOFTGOODS) IMPLANT
SLING ARM IMMOBILIZER MED (SOFTGOODS) ×2 IMPLANT
STEM HUM IDENT SHLD STD 7 (Shoulder) IMPLANT
STRIP CLOSURE SKIN 1/2X4 (GAUZE/BANDAGES/DRESSINGS) ×2 IMPLANT
SUT MAXBRAID #2 CVD NDL (SUTURE) IMPLANT
SUT MNCRL AB 3-0 PS2 18 (SUTURE) ×2 IMPLANT
SUT MON AB 2-0 CT1 36 (SUTURE) ×2 IMPLANT
SUT VIC AB 0 CT1 36 (SUTURE) ×2 IMPLANT
SUT VIC AB 1 CT1 36 (SUTURE) ×2 IMPLANT
SUTURE FIBERWR #2 38 T-5 BLUE (SUTURE) IMPLANT
SUTURE TAPE 1.3 40 TPR END (SUTURE) ×2 IMPLANT
TOWEL OR 17X26 10 PK STRL BLUE (TOWEL DISPOSABLE) ×2 IMPLANT
TOWER SMARTMIX MINI (MISCELLANEOUS) IMPLANT
TUBE SUCTION HIGH CAP CLEAR NV (SUCTIONS) ×2 IMPLANT

## 2024-03-19 NOTE — H&P (Signed)
 ORTHOPAEDIC H and P  REQUESTING PHYSICIAN: [name], MD  PCP:  Sebastian Beverley NOVAK, MD  Chief Complaint: right shoulder OA  HPI: Ethan Lambert is a 63 y.o. male who complains of right shoulder pain and stifness.  Here today for reverse arthroplasty.  No new complaints.  Past Medical History:  Diagnosis Date   Anemia    Arthritis    Asthma    Clotting disorder    CVA (cerebral vascular accident) (HCC) 1999 X 2   LEFT FRONTAL & OCCIPITAL , NON-HEMORRHAGIC;  left sided weakness   Dysrhythmia    A. fib   Family history of malignant neoplasm of gastrointestinal tract    GERD (gastroesophageal reflux disease)    Heart murmur    History of blood transfusion 1999   related to the stroke   History of kidney stones    HIV disease (HCC) dx 1999   MONITORED BY INFECTIOUS DISEASE -- DR LAMAR COMER   Hypertension    Neuromuscular disorder (HCC)    tingling burning in both feet , takes Vit B-12   Paroxysmal atrial fibrillation (HCC)    EP -- Dr. Nancey   PFO (patent foramen ovale)    Hx of PFO   Pneumonia    PONV (postoperative nausea and vomiting)    Pre-diabetes    Right knee meniscal tear    Seizures (HCC)    1999   Skin cancer    skin   Sleep apnea    on CPAP   Past Surgical History:  Procedure Laterality Date   APPENDECTOMY  06/09/2003   ATRIAL FIBRILLATION ABLATION N/A 05/28/2018   Procedure: ATRIAL FIBRILLATION ABLATION;  Surgeon: Kelsie Agent, MD;  [facility];  Service: Cardiovascular;  Laterality: N/A;   INGUINAL HERNIA REPAIR Bilateral 1990s   KNEE ARTHROSCOPY WITH MEDIAL MENISECTOMY Right 12/31/2013   Procedure: RIGHT KNEE ARTHROSCOPY PARTIAL MEDIAL MENISCECTOMY DEBRIDEMENT AND CHONDROPLASTY ;  Surgeon: LAMAR Collet, MD;  Location: [facility];  Service: Orthopedics;  Laterality: Right;   KNEE ARTHROSCOPY WITH MEDIAL MENISECTOMY Left 01/27/2019   Procedure: LEFT KNEE ARTHROSCOPY WITH PARTIAL MEDIAL  MENISCECTOMY;  Surgeon: Jerri Kay HERO, MD;  Location: [facility];  Service: Orthopedics;  Laterality: Left;   LAPAROSCOPIC CHOLECYSTECTOMY  07/08/2003   LASER ABLATION ANAL CONDYLOMA  05/18/2001   LUMBAR LAMINECTOMY/DECOMPRESSION MICRODISCECTOMY Left 03/15/2015   Procedure: Left Lumbar five- sacral one microdiskectomy;  Surgeon: Gerldine Maizes, MD;  Location: [facility];  Service: Neurosurgery;  Laterality: Left;  Left L5S1 microdiskectomy   SKIN CANCER EXCISION Right    cut it off my shoulder   TRANSTHORACIC ECHOCARDIOGRAM  12/01/2008   MODERATE LVH/  EF 55-60%/  MILD MR/  NEGATIVE BUBBLE STUDY   Social History   Socioeconomic History   Marital status: Married    Spouse name: Not on file   Number of children: 1   Years of education: Not on file   Highest education level: 12th grade  Occupational History   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 Retired Biochemist, clinical at [employer], disabled now   Social Drivers of [employer]: Low Risk  (12/14/2023)   Overall Financial Resource Strain (CARDIA)    Difficulty of Paying Living Expenses: Not hard at all  Food Insecurity: No Food Insecurity (12/14/2023)   Hunger Vital Sign    Worried About Running Out of Food in the Last Year: Never true    Ran Out of Food in the Last Year: Never true  Transportation Needs: No Transportation Needs (12/14/2023)   PRAPARE - Administrator, Civil Service (Medical): No    Lack of Transportation (Non-Medical): No  Physical  Activity: Inactive (12/14/2023)   Exercise Vital Sign    Days of Exercise per Week: 0 days    Minutes of Exercise per Session: Not on file  Stress: No Stress Concern Present (12/14/2023)   [employer] of Occupational Health - Occupational Stress Questionnaire    Feeling of Stress: Not at all  Social Connections: Moderately Integrated (12/14/2023)   Social Connection and Isolation Panel    Frequency of Communication with Friends and Family: More than three times a week    Frequency of Social Gatherings with Friends and Family: Once a week    Attends Religious Services: Never    Database administrator or Organizations: Yes    Attends Engineer, structural: 1 to 4 times per year    Marital Status: Married   Family History  Problem Relation Age of Onset   Arrhythmia Father    Melanoma Father    Heart attack Brother    Prostate cancer Maternal Grandfather    Colon cancer Maternal Grandfather    Lung cancer Mother        smoker   Hypertension Mother    Hyperlipidemia Mother    Other Maternal Grandmother        harding of the arteries   Rectal cancer Neg Hx    Stomach cancer Neg Hx    Allergies  Allergen Reactions   Ms Contin [Morphine] Other (See Comments)    Hallucinations    Morphine And Codeine Other (See Comments)   Prior to Admission medications   Medication Sig Start Date End Date Taking? Authorizing Provider  abacavir -dolutegravir -lamiVUDine  (TRIUMEQ ) 600-50-300 MG tablet Take 1 tablet by mouth daily. 01/08/24  Yes Calone, Gregory D, FNP  cyanocobalamin  (VITAMIN B12) 1000 MCG tablet Take 1 tablet (1,000 mcg total) by mouth daily. 12/08/23 12/02/24 Yes Sebastian Beverley NOVAK, MD  diltiazem  (CARDIZEM  CD) 240 MG 24 hr capsule Take 1 capsule (240 mg total) by mouth daily. 01/07/24  Yes Mealor, Augustus E, MD  diltiazem  (CARDIZEM ) 30 MG tablet Take 1 tablet every 4 hours AS NEEDED for afib heart rate >100 08/18/20  Yes Dow Arland BROCKS, NP  ELIQUIS  5 MG TABS tablet TAKE 1  TABLET BY MOUTH TWICE A DAY 01/07/24  Yes Chandrasekhar, Mahesh A, MD  Evolocumab  (REPATHA  SURECLICK) 140 MG/ML SOAJ Inject 140 mg into the skin every 14 (fourteen) days. 03/28/23  Yes Chandrasekhar, Mahesh A, MD  flecainide  (TAMBOCOR ) 50 MG tablet Take 1 tablet (50 mg total) by mouth 2 (two) times daily. 11/06/23  Yes Mealor, Augustus E, MD  Semaglutide ,0.25 or 0.5MG /DOS, (OZEMPIC , 0.25 OR 0.5 MG/DOSE,) 2 MG/3ML SOPN INJECT 0.25MG  INTO THE SKIN ONE TIME PER WEEK 02/03/24  Yes Sebastian Beverley NOVAK, MD  albuterol  (VENTOLIN  HFA) 108 [phone] Base) MCG/ACT inhaler  TAKE 2 PUFFS BY MOUTH EVERY 6 HOURS AS NEEDED FOR WHEEZE OR SHORTNESS OF BREATH 07/02/22   Sebastian Beverley NOVAK, MD   No results found.  Positive ROS: All other systems have been reviewed and were otherwise negative with the exception of those mentioned in the HPI and as above.  Physical Exam: General: Alert, no acute distress Cardiovascular: No pedal edema Respiratory: No cyanosis, no use of accessory musculature GI: No organomegaly, abdomen is soft and non-tender Skin: No lesions in the area of chief complaint Neurologic: Sensation intact distally Psychiatric: Patient is competent for consent with normal mood and affect Lymphatic: No axillary or cervical lymphadenopathy  MUSCULOSKELETAL: RUE- wwp, nvi  Assessment: Right shoulder cuff tear arthropathy  Plan: - to OR today for reverse arthroplasty  - The risks, benefits, and alternatives were discussed with the patient. There are risks associated with the surgery including, but not limited to, problems with anesthesia (death), infection, , fracture of bones, loosening or failure of implants, malunion, nonunion, hematoma (blood accumulation) which may require surgical drainage, blood clots, pulmonary embolism, nerve injury (foot drop), and blood vessel injury. The patient understands these risks and elects to proceed.  - dc home post op   Selinda Belvie Gosling, MD Cell [phone]     03/19/2024 7:35 AM

## 2024-03-19 NOTE — Anesthesia Procedure Notes (Signed)
 Procedure Name: Intubation Date/Time: 03/19/2024 10:10 AM  Performed by: Judythe Tanda Aran, CRNAPre-anesthesia Checklist: Patient identified, Emergency Drugs available, Suction available and Patient being monitored Patient Re-evaluated:Patient Re-evaluated prior to induction Oxygen Delivery Method: Circle system utilized Preoxygenation: Pre-oxygenation with 100% oxygen Induction Type: IV induction Ventilation: Mask ventilation without difficulty Laryngoscope Size: 2 and Miller Grade View: Grade II Tube type: Oral Tube size: 7.5 mm Number of attempts: 1 Airway Equipment and Method: Stylet Placement Confirmation: ETT inserted through vocal cords under direct vision, positive ETCO2 and breath sounds checked- equal and bilateral Secured at: 23 cm Tube secured with: Tape Dental Injury: Teeth and Oropharynx as per pre-operative assessment

## 2024-03-19 NOTE — Anesthesia Procedure Notes (Signed)
 Anesthesia Regional Block: Interscalene brachial plexus block   Pre-Anesthetic Checklist: , timeout performed,  Correct Patient, Correct Site, Correct Laterality,  Correct Procedure, Correct Position, site marked,  Risks and benefits discussed,  Surgical consent,  Pre-op  evaluation,  At surgeon's request and post-op pain management  Laterality: Right  Prep: Dura Prep       Needles:  Injection technique: Single-shot  Needle Type: Echogenic Stimulator Needle     Needle Length: 5cm  Needle Gauge: 20     Additional Needles:   Procedures:,,,, ultrasound used (permanent image in chart),,    Narrative:  Start time: 03/19/2024 9:25 AM End time: 03/19/2024 9:28 AM Injection made incrementally with aspirations every 5 mL.  Performed by: Personally  Anesthesiologist: Dorethea Cordella SQUIBB, DO  Additional Notes: Patient identified. Risks/Benefits/Options discussed with patient including but not limited to bleeding, infection, nerve damage, failed block, incomplete pain control. Patient expressed understanding and wished to proceed. All questions were answered. Sterile technique was used throughout the entire procedure. Please see nursing notes for vital signs. Aspirated in 5cc intervals with injection for negative confirmation. Patient was given instructions on fall risk and not to get out of bed. All questions and concerns addressed with instructions to call with any issues or inadequate analgesia.

## 2024-03-19 NOTE — Op Note (Signed)
 03/19/2024  11:43 AM  PATIENT:  Ethan Lambert    PRE-OPERATIVE DIAGNOSIS:  Right shoulder rotator cuff arthropathy  POST-OPERATIVE DIAGNOSIS:  Same  PROCEDURE:  Right ARTHROPLASTY, SHOULDER, TOTAL, REVERSE  SURGEON:  Selinda Belvie Gosling, MD  ASSISTANT: Dayle Moores, PA-C  Assistant attestation:  PA Mcclung scrubbed and present for the entire procedure.  ANESTHESIA:   General With interscalene  ESTIMATED BLOOD LOSS: 150 cc  PREOPERATIVE INDICATIONS:  Ethan Lambert is a  63 y.o. male with a diagnosis of Right shoulder rotator cuff arthropathy who failed conservative measures and elected for surgical management.    The risks benefits and alternatives were discussed with the patient preoperatively including but not limited to the risks of infection, bleeding, nerve injury, cardiopulmonary complications, the need for revision surgery, dislocation, brachial plexus palsy, incomplete relief of pain, among others, and the patient was willing to proceed.  OPERATIVE IMPLANTS:  Zimmer identity size 7 stem with a extra extended humeral tray and a flat 135 polyethylene liner Size medium augment glenoid baseplate with a 35 mm central screw and 4 peripheral locking screws and a 36 mm +3 mm lateralized glenosphere   OPERATIVE FINDINGS: Advanced rotator cuff arthropathy changes with subdeltoid adhesions consistent with previous arthroscopic surgery as well as deficiency of the supraspinatus and infraspinatus and upper two thirds of the subscapularis.  The biceps tendon was absent.  There was full-thickness cartilage loss on the humeral head.  Glenoid cartilage remained intact.  OPERATIVE PROCEDURE: The patient was brought to the operating room and placed in the supine position. General anesthesia was administered. IV antibiotics were given. A Foley was not placed. Time out was performed. The upper extremity was prepped and draped in usual sterile fashion. The patient was in a beachchair  position. Deltopectoral approach was carried out. The subscapularis was released off of the bone.   I then performed circumferential releases of the humerus, and then dislocated the head, and then reamed with the reamer to the above named size.  I then applied the jig, and cut the humeral head in 30 of retroversion, and then turned my attention to the glenoid.  Deep retractors were placed, and I resected the labrum, and then placed a guidepin into the center position on the glenoid, with slight inferior inclination. I then reamed over the guidepin, and this created a small metaphyseal cancellus blush inferiorly, removing just the cartilage to the subchondral bone superiorly.  We then selected a size medium augment given the superior wear pattern on the glenoid.  This was oriented at the 11 o'clock position.  The base plate was selected and impacted place, and then I secured it centrally with a nonlocking screw, and I had excellent purchase both inferiorly and superiorly. I placed a short locking screws on anterior and posterior aspects.  I then turned my attention to the glenosphere, and impacted this into place, placing slight inferior offset (set on A).   The glenoid sphere was completely seated, and had engagement of the [facility] taper. I then turned my attention back to the humerus.  I sequentially broached, and then trialed, and was found to restore soft tissue tension, and it had 2 finger tightness. Therefore the above named components were selected. The shoulder felt stable throughout functional motion.   I then impacted the real prosthesis into place, as well as the real humeral tray, and reduced the shoulder. The shoulder had excellent motion, and was stable, and I irrigated the wounds copiously.   The subscapularis  was not repaired.  I then irrigated the shoulder copiously once more, we placed 1 g of vancomycin  powder into the wound, repaired the deltopectoral interval with #2 FiberWire  followed by subcutaneous Vicryl, then monocryl for the skin,  with Steri-Strips and sterile gauze for the skin. The patient was awakened and returned back in stable and satisfactory condition. There no complications and they tolerated the procedure well.  All counts were correct x2.  Disposition:  Ethan Lambert will be in his sling for the first 2 weeks.  He may begin using the arm immediately for activities of daily living but no lifting over 5 pounds for the first 6 weeks.  Will see him in the office in 2 weeks with wound check and AP and scapular Y x-rays of the right shoulder.  Discharge home today, from PACU.

## 2024-03-19 NOTE — Transfer of Care (Signed)
 Immediate Anesthesia Transfer of Care Note  Patient: Ethan Lambert  Procedure(s) Performed: ARTHROPLASTY, SHOULDER, TOTAL, REVERSE (Right: Shoulder)  Patient Location: PACU  Anesthesia Type:GA combined with regional for post-op pain  Level of Consciousness: awake and patient cooperative  Airway & Oxygen Therapy: Patient Spontanous Breathing and Patient connected to face mask  Post-op Assessment: Report given to RN and Post -op Vital signs reviewed and stable  Post vital signs: Reviewed and stable  Last Vitals:  Vitals Value Taken Time  BP 130/89 03/19/24 11:51  Temp    Pulse 60 03/19/24 11:55  Resp 17 03/19/24 11:55  SpO2 91 % 03/19/24 11:55  Vitals shown include unfiled device data.  Last Pain:  Vitals:   03/19/24 0940  TempSrc:   PainSc: 0-No pain         Complications: No notable events documented.

## 2024-03-19 NOTE — Care Plan (Signed)
 Ortho Bundle Case Management Note  Patient Details  Name: Ethan Lambert MRN: [id number] Date of Birth: [birth year]  R Rev TSA on 03/19/24  DCP: Home with husband   DME: No needs  PT: HEP                   DME Arranged:  N/A DME Agency:  NA  HH Arranged:    HH Agency:     Additional Comments: Please contact me with any questions of if this plan should need to change.  Lyle Pepper, [place] [facility] [phone]   03/19/2024, 9:44 AM

## 2024-03-19 NOTE — Anesthesia Postprocedure Evaluation (Signed)
 Anesthesia Post Note  Patient: Ethan Lambert  Procedure(s) Performed: ARTHROPLASTY, SHOULDER, TOTAL, REVERSE (Right: Shoulder)     Patient location during evaluation: PACU Anesthesia Type: General and Regional Level of consciousness: awake and alert Pain management: pain level controlled Vital Signs Assessment: post-procedure vital signs reviewed and stable Respiratory status: spontaneous breathing, nonlabored ventilation, respiratory function stable and patient connected to nasal cannula oxygen Cardiovascular status: blood pressure returned to baseline and stable Postop Assessment: no apparent nausea or vomiting Anesthetic complications: no   No notable events documented.  Last Vitals:  Vitals:   03/19/24 1259 03/19/24 1300  BP:  108/69  Pulse: (!) 57 (!) 54  Resp: 16 15  Temp:  (!) 36.4 C  SpO2: 90% 91%    Last Pain:  Vitals:   03/19/24 1300  TempSrc:   PainSc: 2                  Drew Herman P [place]

## 2024-03-19 NOTE — Discharge Instructions (Addendum)
 Orthopedic surgery discharge instructions:  -Maintain postoperative bandage until follow-up appointment.  This is waterproof, and you may begin showering on postoperative day #3.  Do not submerge underwater.  Maintain that bandage until your follow-up appointment in 2 weeks.  -No lifting over 2 pounds with operateive arm.  You may use the arm immediately for activities of daily living such as bathing, washing your face and brushing your teeth, eating, and getting dressed.  Otherwise maintain your sling when you are out of the house and sleeping.  -Apply ice liberally to the shoulder throughout the day.  For mild to moderate pain use Tylenol and Advil as needed around-the-clock.  For breakthrough pain use oxycodone as necessary.  -You will return to see Dr. Aundria Rud in the office in 2 weeks for routine postoperative check with x-rays.

## 2024-03-19 NOTE — Brief Op Note (Signed)
 03/19/2024  11:42 AM  PATIENT:  Ethan Lambert  63 y.o. male  PRE-OPERATIVE DIAGNOSIS:  Right shoulder rotator cuff arthropathy  POST-OPERATIVE DIAGNOSIS:  same  PROCEDURE:  Procedure(s): ARTHROPLASTY, SHOULDER, TOTAL, REVERSE (Right)  SURGEON:  Surgeons and Role:    * [name], MD - Primary  ASSISTANTS: Dayle Moores, PA-C   ANESTHESIA:   regional and general  EBL:  100 mL   BLOOD ADMINISTERED:none  DRAINS: none   LOCAL MEDICATIONS USED:  NONE  SPECIMEN:  No Specimen  DISPOSITION OF SPECIMEN:  N/A  COUNTS:  YES  TOURNIQUET:  * No tourniquets in log *  DICTATION: .Note written in EPIC  PLAN OF CARE: Discharge to home after PACU  PATIENT DISPOSITION:  PACU - hemodynamically stable.   Delay start of Pharmacological VTE agent (>24hrs) due to surgical blood loss or risk of bleeding: not applicable

## 2024-03-20 ENCOUNTER — Other Ambulatory Visit: Payer: Self-pay | Admitting: Family Medicine

## 2024-03-20 DIAGNOSIS — R809 Proteinuria, unspecified: Secondary | ICD-10-CM

## 2024-03-20 DIAGNOSIS — I480 Paroxysmal atrial fibrillation: Secondary | ICD-10-CM

## 2024-03-20 DIAGNOSIS — G4733 Obstructive sleep apnea (adult) (pediatric): Secondary | ICD-10-CM

## 2024-03-20 DIAGNOSIS — E782 Mixed hyperlipidemia: Secondary | ICD-10-CM

## 2024-03-20 DIAGNOSIS — I10 Essential (primary) hypertension: Secondary | ICD-10-CM

## 2024-03-20 DIAGNOSIS — I700 Atherosclerosis of aorta: Secondary | ICD-10-CM

## 2024-03-20 DIAGNOSIS — I6350 Cerebral infarction due to unspecified occlusion or stenosis of unspecified cerebral artery: Secondary | ICD-10-CM

## 2024-03-22 ENCOUNTER — Encounter (HOSPITAL_COMMUNITY): Payer: Self-pay | Admitting: Orthopedic Surgery

## 2024-04-01 DIAGNOSIS — Z4789 Encounter for other orthopedic aftercare: Secondary | ICD-10-CM | POA: Diagnosis not present

## 2024-04-07 ENCOUNTER — Telehealth: Payer: Self-pay | Admitting: Internal Medicine

## 2024-04-07 NOTE — Telephone Encounter (Signed)
*  STAT* If patient is at the pharmacy, call can be transferred to refill team.   1. Which medications need to be refilled? (please list name of each medication and dose if known) Evolocumab  (REPATHA  SURECLICK) 140 MG/ML SOAJ    2. Would you like to learn more about the convenience, safety, & potential cost savings by using the [facility]? No   3. Are you open to using the [facility] (Type [facility].) No   4. Which pharmacy/location (including street and city if local pharmacy) is medication to be sent to?  [id number] [place], [place] - [address]   5. Do they need a 30 day or 90 day supply? 30 day

## 2024-04-08 ENCOUNTER — Other Ambulatory Visit: Payer: Self-pay

## 2024-04-08 DIAGNOSIS — E785 Hyperlipidemia, unspecified: Secondary | ICD-10-CM

## 2024-04-08 DIAGNOSIS — I700 Atherosclerosis of aorta: Secondary | ICD-10-CM

## 2024-04-08 MED ORDER — REPATHA SURECLICK 140 MG/ML ~~LOC~~ SOAJ: 140.0000 mg | SUBCUTANEOUS | 3 refills | Status: DC

## 2024-04-09 MED ORDER — REPATHA SURECLICK 140 MG/ML ~~LOC~~ SOAJ: 140.0000 mg | SUBCUTANEOUS | 3 refills | Status: AC

## 2024-05-10 ENCOUNTER — Encounter: Payer: Self-pay | Admitting: Pharmacist

## 2024-05-19 NOTE — Progress Notes (Signed)
 [name]                                          MRN: [id number]   05/19/2024   The VBCI Quality Team Specialist reviewed this patient medical record for the purposes of chart review for care gap closure. The following were reviewed: abstraction for care gap closure-glycemic status assessment. KED closed in [facility] portal.    VBCI Quality Team

## 2024-05-26 ENCOUNTER — Other Ambulatory Visit (HOSPITAL_COMMUNITY): Payer: Self-pay

## 2024-06-04 ENCOUNTER — Ambulatory Visit

## 2024-06-04 VITALS — Ht 71.5 in | Wt 272.0 lb

## 2024-06-04 DIAGNOSIS — Z0000 Encounter for general adult medical examination without abnormal findings: Secondary | ICD-10-CM

## 2024-06-04 NOTE — Patient Instructions (Signed)
 Ethan Lambert,  Thank you for taking the time for your Medicare Wellness Visit. I appreciate your continued commitment to your health goals. Please review the care plan we discussed, and feel free to reach out if I can assist you further.  Please note that Annual Wellness Visits do not include a physical exam. Some assessments may be limited, especially if the visit was conducted virtually. If needed, we may recommend an in-person follow-up with your provider.  Ongoing Care Seeing your primary care provider every 3 to 6 months helps us  monitor your health and provide consistent, personalized care.   Referrals If a referral was made during today's visit and you haven't received any updates within two weeks, please contact the referred provider directly to check on the status.  Recommended Screenings:  Health Maintenance  Topic Date Due   Medicare Annual Wellness Visit  Never done   Complete foot exam   Never done   Zoster (Shingles) Vaccine (1 of 2) Never done   Flu Shot  01/23/2024   COVID-19 Vaccine (3 - 2025-26 season) 02/23/2024   Hemoglobin A1C  06/02/2024   Pneumococcal Vaccine for age over 22 (4 of 4 - PCV20 or PCV21) 07/28/2024   Yearly kidney health urinalysis for diabetes  12/01/2024   Eye exam for diabetics  12/28/2024   Colon Cancer Screening  01/20/2025   Yearly kidney function blood test for diabetes  03/10/2025   DTaP/Tdap/Td vaccine (3 - Td or Tdap) 03/14/2026   Hepatitis B Vaccine  Completed   Hepatitis C Screening  Completed   HIV Screening  Completed   HPV Vaccine  Aged Out   Meningitis B Vaccine  Aged Out       06/03/2024    6:39 PM  Advanced Directives  Does Patient Have a Medical Advance Directive? Yes  Type of Estate Agent of [place];Living will  Does patient want to make changes to medical advance directive? No - Patient declined  Copy of Healthcare Power of Attorney in Chart? No - copy requested    Vision: Annual vision  screenings are recommended for early detection of glaucoma, cataracts, and diabetic retinopathy. These exams can also reveal signs of chronic conditions such as diabetes and high blood pressure.  Dental: Annual dental screenings help detect early signs of oral cancer, gum disease, and other conditions linked to overall health, including heart disease and diabetes.  Please see the attached documents for additional preventive care recommendations.

## 2024-06-04 NOTE — Progress Notes (Signed)
 Chief Complaint  Patient presents with   Medicare Wellness     Subjective:   Ethan Lambert is a 63 y.o. male who presents for a Medicare Annual Wellness Visit.  Visit info / Clinical Intake: Medicare Wellness Visit Type:: Initial Annual Wellness Visit Persons participating in visit and providing information:: patient Medicare Wellness Visit Mode:: Video Since this visit was completed virtually, some vitals may be partially provided or unavailable. Missing vitals are due to the limitations of the virtual format.: Documented vitals are patient reported If Telephone or Video please confirm:: I connected with patient using audio/video enable telemedicine. I verified patient identity with two identifiers, discussed telehealth limitations, and patient agreed to proceed. Patient Location:: home Provider Location:: office Interpreter Needed?: No Pre-visit prep was completed: yes AWV questionnaire completed by patient prior to visit?: yes Date:: 06/03/24 Living arrangements:: (Patient-Rptd) lives with spouse/significant other Patient's Overall Health Status Rating: (!) (Patient-Rptd) fair Typical amount of pain: (!) (Patient-Rptd) a lot Does pain affect daily life?: (!) (Patient-Rptd) yes Are you currently prescribed opioids?: no  Dietary Habits and Nutritional Risks How many meals a day?: (Patient-Rptd) 3 Eats fruit and vegetables daily?: (Patient-Rptd) yes Most meals are obtained by: (Patient-Rptd) preparing own meals In the last 2 weeks, have you had any of the following?: (!) nausea, vomiting, diarrhea (the day after ozempic  sometimes) Diabetic:: (!) yes Any non-healing wounds?: no How often do you check your BS?: 0 Would you like to be referred to a Nutritionist or for Diabetic Management? : no  Functional Status Activities of Daily Living (to include ambulation/medication): (Patient-Rptd) Independent Ambulation: Independent with device- listed below (uses cane when having  pain in foot) Medication Administration: (Patient-Rptd) Independent Home Management (perform basic housework or laundry): (Patient-Rptd) Needs assistance (comment) Manage your own finances?: (Patient-Rptd) yes Primary transportation is: (Patient-Rptd) driving Concerns about vision?: (!) yes (needs some adjustment to glasses) Concerns about hearing?: no  Fall Screening Falls in the past year?: (Patient-Rptd) 1 Number of falls in past year: (Patient-Rptd) 0 Was there an injury with Fall?: (Patient-Rptd) 1 Fall Risk Category Calculator: (Patient-Rptd) 2 Patient Fall Risk Level: (Patient-Rptd) Moderate Fall Risk  Fall Risk Patient at Risk for Falls Due to: Impaired mobility; Impaired balance/gait Fall risk Follow up: Falls evaluation completed; Falls prevention discussed  Home and Transportation Safety: All rugs have non-skid backing?: (Patient-Rptd) yes All stairs or steps have railings?: (!) (Patient-Rptd) no Grab bars in the bathtub or shower?: (Patient-Rptd) yes Have non-skid surface in bathtub or shower?: (Patient-Rptd) yes Good home lighting?: (Patient-Rptd) yes Regular seat belt use?: (Patient-Rptd) yes Hospital stays in the last year:: (Patient-Rptd) no  Cognitive Assessment Difficulty concentrating, remembering, or making decisions? : (Patient-Rptd) no Will 6CIT or Mini Cog be Completed: yes What year is it?: 0 points What month is it?: 0 points Give patient an address phrase to remember (5 components): [address] About what time is it?: 0 points Count backwards from 20 to 1: 0 points Say the months of the year in reverse: 0 points Repeat the address phrase from earlier: 0 points 6 CIT Score: 0 points  Advance Directives (For Healthcare) Does Patient Have a Medical Advance Directive?: Yes Does patient want to make changes to medical advance directive?: No - Patient declined Type of Advance Directive: Healthcare Power of [place]; Living will Copy of  Healthcare Power of Attorney in Chart?: No - copy requested Copy of Living Will in Chart?: No - copy requested  Reviewed/Updated  Reviewed/Updated: Reviewed All (  Medical, Surgical, Family, Medications, Allergies, Care Teams, Patient Goals)    Allergies (verified) Ms contin [morphine], Statins, and Morphine and codeine   Current Medications (verified) Outpatient Encounter Medications as of 06/04/2024  Medication Sig   abacavir -dolutegravir -lamiVUDine  (TRIUMEQ ) 600-50-300 MG tablet Take 1 tablet by mouth daily.   albuterol  (VENTOLIN  HFA) 108 (90 Base) MCG/ACT inhaler TAKE 2 PUFFS BY MOUTH EVERY 6 HOURS AS NEEDED FOR WHEEZE OR SHORTNESS OF BREATH   cyanocobalamin  (VITAMIN B12) 1000 MCG tablet Take 1 tablet (1,000 mcg total) by mouth daily.   diltiazem  (CARDIZEM  CD) 240 MG 24 hr capsule Take 1 capsule (240 mg total) by mouth daily.   diltiazem  (CARDIZEM ) 30 MG tablet Take 1 tablet every 4 hours AS NEEDED for afib heart rate >100   ELIQUIS  5 MG TABS tablet TAKE 1 TABLET BY MOUTH TWICE A DAY   Evolocumab  (REPATHA  SURECLICK) 140 MG/ML SOAJ Inject 140 mg into the skin every 14 (fourteen) days.   flecainide  (TAMBOCOR ) 50 MG tablet Take 1 tablet (50 mg total) by mouth 2 (two) times daily.   ondansetron  (ZOFRAN -ODT) 4 MG disintegrating tablet Take 1 tablet (4 mg total) by mouth every 8 (eight) hours as needed for nausea or vomiting.   Semaglutide ,0.25 or 0.5MG /DOS, (OZEMPIC , 0.25 OR 0.5 MG/DOSE,) 2 MG/3ML SOPN INJECT 0.25 MG SUBCUTANEOUSLY ONE TIME PER WEEK   oxyCODONE  (ROXICODONE ) 5 MG immediate release tablet Take 1 tablet (5 mg total) by mouth every 4 (four) hours as needed for moderate pain (pain score 4-6) or severe pain (pain score 7-10). (Patient not taking: Reported on 06/04/2024)   No facility-administered encounter medications on file as of 06/04/2024.    History: Past Medical History:  Diagnosis Date   Anemia    Arthritis    Asthma    Clotting disorder    CVA (cerebral vascular  accident) (HCC) 1999 X 2   LEFT FRONTAL & OCCIPITAL , NON-HEMORRHAGIC;  left sided weakness   Dysrhythmia    A. fib   Family history of malignant neoplasm of gastrointestinal tract    GERD (gastroesophageal reflux disease)    Heart murmur    History of blood transfusion 1999   related to the stroke   History of kidney stones    HIV disease (HCC) dx 1999   MONITORED BY INFECTIOUS DISEASE -- DR LAMAR COMER   Hypertension    Neuromuscular disorder (HCC)    tingling burning in both feet , takes Vit B-12   Paroxysmal atrial fibrillation (HCC)    EP -- Dr. Nancey   PFO (patent foramen ovale)    Hx of PFO   Pneumonia    PONV (postoperative nausea and vomiting)    Pre-diabetes    Right knee meniscal tear    Seizures (HCC)    1999   Skin cancer    skin   Sleep apnea    on CPAP   Past Surgical History:  Procedure Laterality Date   APPENDECTOMY  06/09/2003   ATRIAL FIBRILLATION ABLATION N/A 05/28/2018   Procedure: ATRIAL FIBRILLATION ABLATION;  Surgeon: Kelsie Agent, MD;  Location: [facility];  Service: Cardiovascular;  Laterality: N/A;   INGUINAL HERNIA REPAIR Bilateral 1990s   KNEE ARTHROSCOPY WITH MEDIAL MENISECTOMY Right 12/31/2013   Procedure: RIGHT KNEE ARTHROSCOPY PARTIAL MEDIAL MENISCECTOMY DEBRIDEMENT AND CHONDROPLASTY ;  Surgeon: Lamar Collet, MD;  Location: [facility];  Service: Orthopedics;  Laterality: Right;   KNEE ARTHROSCOPY WITH MEDIAL MENISECTOMY Left 01/27/2019   Procedure: LEFT KNEE ARTHROSCOPY WITH  PARTIAL MEDIAL MENISCECTOMY;  Surgeon: Jerri Kay HERO, MD;  Location: [facility];  Service: Orthopedics;  Laterality: Left;   LAPAROSCOPIC CHOLECYSTECTOMY  07/08/2003   LASER ABLATION ANAL CONDYLOMA  05/18/2001   LUMBAR LAMINECTOMY/DECOMPRESSION MICRODISCECTOMY Left 03/15/2015   Procedure: Left Lumbar five- sacral one microdiskectomy;  Surgeon: Gerldine Maizes, MD;  Location: [facility];  Service: Neurosurgery;   Laterality: Left;  Left L5S1 microdiskectomy   REVERSE SHOULDER ARTHROPLASTY Right 03/19/2024   Procedure: ARTHROPLASTY, SHOULDER, TOTAL, REVERSE;  Surgeon: [name], MD;  Location: [facility];  Service: Orthopedics;  Laterality: Right;   SKIN CANCER EXCISION Right    cut it off my shoulder   TRANSTHORACIC ECHOCARDIOGRAM  12/01/2008   MODERATE LVH/  EF 55-60%/  MILD MR/  NEGATIVE BUBBLE STUDY   Family History  Problem Relation Age of Onset   Arrhythmia Father    Melanoma Father    Heart attack Brother    Prostate cancer Maternal Grandfather    Colon cancer Maternal Grandfather    Lung cancer Mother        smoker   Hypertension Mother    Hyperlipidemia Mother    Other Maternal Grandmother        harding of the arteries   Rectal cancer Neg Hx    Stomach cancer Neg Hx    Social History   Occupational History   Occupation: [employer]    Employer: [employer]  Tobacco Use   Smoking status: Never    Passive exposure: Never   Smokeless tobacco: Never  Vaping Use   Vaping status: Never Used  Substance and Sexual Activity   Alcohol  use: Yes    Comment: rarely; 1-2 per month   Drug use: Never   Sexual activity: Yes    Partners: Male    Comment: declined condoms   Tobacco Counseling Counseling given: Not Answered  SDOH Screenings   Food Insecurity: No Food Insecurity (06/03/2024)  Housing: Low Risk (06/03/2024)  Transportation Needs: No Transportation Needs (06/03/2024)  Utilities: Not At Risk (06/04/2024)  Alcohol  Screen: Low Risk (06/03/2024)  Depression (PHQ2-9): Low Risk (06/04/2024)  Financial Resource Strain: Low Risk (06/03/2024)  Physical Activity: Inactive (06/03/2024)  Social Connections: Unknown (06/03/2024)  Stress: No Stress Concern Present (06/03/2024)  Tobacco Use: Low Risk (06/04/2024)  Health Literacy: Adequate Health Literacy (06/04/2024)   See flowsheets for full screening details  Depression Screen PHQ 2 & 9 Depression  Scale- Over the past 2 weeks, how often have you been bothered by any of the following problems? Little interest or pleasure in doing things: 0 Feeling down, depressed, or hopeless (PHQ Adolescent also includes...irritable): 0 PHQ-2 Total Score: 0 Trouble falling or staying asleep, or sleeping too much: 1 Feeling tired or having little energy: 2 Poor appetite or overeating (PHQ Adolescent also includes...weight loss): 0 Feeling bad about yourself - or that you are a failure or have let yourself or your family down: 0 Trouble concentrating on things, such as reading the newspaper or watching television (PHQ Adolescent also includes...like school work): 0 Moving or speaking so slowly that other people could have noticed. Or the opposite - being so fidgety or restless that you have been moving around a lot more than usual: 0 Thoughts that you would be better off dead, or of hurting yourself in some way: 0 PHQ-9 Total Score: 3 If you checked off any problems, how difficult have these problems made it for you to do your work, take care of things at  home, or get along with other people?: Not difficult at all  Depression Treatment Depression Interventions/Treatment : EYV7-0 Score <4 Follow-up Not Indicated     Goals Addressed             This Visit's Progress    Patient Stated       06/04/2024, wants to lose more weight, get arm back to normal             Objective:    Today's Vitals   06/04/24 1601  Weight: 272 lb (123.4 kg)  Height: 5' 11.5 (1.816 m)   Body mass index is 37.41 kg/m.  Hearing/Vision screen Hearing Screening - Comments:: Denies hearing issues Vision Screening - Comments:: Regular eye exams, Does not remember name Immunizations and Health Maintenance Health Maintenance  Topic Date Due   FOOT EXAM  Never done   Zoster Vaccines- Shingrix (1 of 2) Never done   Influenza Vaccine  01/23/2024   COVID-19 Vaccine (3 - 2025-26 season) 02/23/2024   HEMOGLOBIN A1C   06/02/2024   Pneumococcal Vaccine: 50+ Years (4 of 4 - PCV20 or PCV21) 07/28/2024   Diabetic kidney evaluation - Urine ACR  12/01/2024   OPHTHALMOLOGY EXAM  12/28/2024   Colonoscopy  01/20/2025   Diabetic kidney evaluation - eGFR measurement  03/10/2025   Medicare Annual Wellness (AWV)  06/04/2025   DTaP/Tdap/Td (3 - Td or Tdap) 03/14/2026   Hepatitis B Vaccines 19-59 Average Risk  Completed   Hepatitis C Screening  Completed   HIV Screening  Completed   HPV VACCINES  Aged Out   Meningococcal B Vaccine  Aged Out        Assessment/Plan:  This is a routine wellness examination for Ethan Lambert.  Patient Care Team: Sebastian Beverley NOVAK, MD as PCP - General (Family Medicine) Santo Stanly LABOR, MD as PCP - Cardiology (Cardiology) Mealor, Eulas BRAVO, MD as PCP - Electrophysiology (Cardiology) Comer, Lamar ORN, MD (Inactive) as Consulting Physician (Infectious Diseases) Shona Rush, MD (Dermatology)  I have personally reviewed and noted the following in the patients chart:   Medical and social history Use of alcohol , tobacco or illicit drugs  Current medications and supplements including opioid prescriptions. Functional ability and status Nutritional status Physical activity Advanced directives List of other physicians Hospitalizations, surgeries, and ER visits in previous 12 months Vitals Screenings to include cognitive, depression, and falls Referrals and appointments  No orders of the defined types were placed in this encounter.  In addition, I have reviewed and discussed with patient certain preventive protocols, quality metrics, and best practice recommendations. A written personalized care plan for preventive services as well as general preventive health recommendations were provided to patient.   Ardella BRAVO Dawn, LPN   [id number]/[id number]   Return in 1 year (on 06/04/2025).  After Visit Summary: (MyChart) Due to this being a telephonic visit, the after visit summary with  patients personalized plan was offered to patient via MyChart   Nurse Notes: Appointment(s) made: (06/11/2024 for left foot pain)

## 2024-06-11 ENCOUNTER — Ambulatory Visit: Admitting: Family Medicine

## 2024-06-18 ENCOUNTER — Telehealth: Payer: Self-pay

## 2024-06-18 NOTE — Telephone Encounter (Signed)
 I called and spoke with patient and notified him that he will need to go to nearest urgent care facility to be evaluated and seen.

## 2024-06-18 NOTE — Telephone Encounter (Signed)
 Copied from CRM #[id number]. Topic: Clinical - Medication Question >> Jun 18, 2024  2:04 PM Mercedes MATSU wrote: Reason for CRM: Patient called in stating that he has been having sinus infection symptoms for about 4 days now. Patient called in requesting that Dr Sebastian send a antibiotic to his pharmacy on file which is: [id number] [place], [place] - [address] [facility] [address] [facility] [address] [place] [address] Phone: [phone] Fax: [phone] Hours: Not open 24 hours    CB: [phone]

## 2024-07-03 ENCOUNTER — Other Ambulatory Visit: Payer: Self-pay | Admitting: Internal Medicine

## 2024-07-03 DIAGNOSIS — I480 Paroxysmal atrial fibrillation: Secondary | ICD-10-CM

## 2024-09-06 ENCOUNTER — Ambulatory Visit: Admitting: Physician Assistant

## 2024-12-13 ENCOUNTER — Other Ambulatory Visit (HOSPITAL_COMMUNITY)

## 2024-12-22 ENCOUNTER — Other Ambulatory Visit

## 2025-01-07 ENCOUNTER — Ambulatory Visit

## 2025-01-07 ENCOUNTER — Ambulatory Visit: Payer: Self-pay | Admitting: Family

## 2025-06-07 ENCOUNTER — Ambulatory Visit
